# Patient Record
Sex: Male | Born: 1950
Health system: Southern US, Community
[De-identification: ages and names within clinical notes are randomized; demographics above are authoritative.]

## PROBLEM LIST (undated history)

## (undated) DIAGNOSIS — M549 Dorsalgia, unspecified: Secondary | ICD-10-CM

## (undated) DIAGNOSIS — I2510 Atherosclerotic heart disease of native coronary artery without angina pectoris: Secondary | ICD-10-CM

## (undated) DIAGNOSIS — M1990 Unspecified osteoarthritis, unspecified site: Secondary | ICD-10-CM

## (undated) DIAGNOSIS — G8929 Other chronic pain: Secondary | ICD-10-CM

## (undated) DIAGNOSIS — K5190 Ulcerative colitis, unspecified, without complications: Secondary | ICD-10-CM

## (undated) DIAGNOSIS — D649 Anemia, unspecified: Secondary | ICD-10-CM

## (undated) DIAGNOSIS — N400 Enlarged prostate without lower urinary tract symptoms: Secondary | ICD-10-CM

## (undated) DIAGNOSIS — I10 Essential (primary) hypertension: Secondary | ICD-10-CM

## (undated) DIAGNOSIS — Z8619 Personal history of other infectious and parasitic diseases: Secondary | ICD-10-CM

## (undated) DIAGNOSIS — C61 Malignant neoplasm of prostate: Secondary | ICD-10-CM

## (undated) DIAGNOSIS — G709 Myoneural disorder, unspecified: Secondary | ICD-10-CM

## (undated) DIAGNOSIS — U071 COVID-19: Secondary | ICD-10-CM

## (undated) DIAGNOSIS — E785 Hyperlipidemia, unspecified: Secondary | ICD-10-CM

## (undated) DIAGNOSIS — H43392 Other vitreous opacities, left eye: Secondary | ICD-10-CM

## (undated) DIAGNOSIS — G629 Polyneuropathy, unspecified: Secondary | ICD-10-CM

## (undated) DIAGNOSIS — I739 Peripheral vascular disease, unspecified: Secondary | ICD-10-CM

## (undated) HISTORY — DX: Hyperlipidemia, unspecified: E78.5

## (undated) HISTORY — PX: HERNIA REPAIR: SHX51

## (undated) HISTORY — PX: PERIPHERAL ARTERIAL STENT GRAFT: SHX2220

## (undated) HISTORY — DX: Other vitreous opacities, left eye: H43.392

## (undated) HISTORY — DX: Anemia, unspecified: D64.9

## (undated) HISTORY — DX: Essential (primary) hypertension: I10

## (undated) HISTORY — DX: Atherosclerotic heart disease of native coronary artery without angina pectoris: I25.10

## (undated) HISTORY — PX: PERCUTANEOUS CORONARY STENT INTERVENTION (PCI-S): SHX6016

## (undated) HISTORY — DX: Unspecified osteoarthritis, unspecified site: M19.90

## (undated) HISTORY — DX: Personal history of other infectious and parasitic diseases: Z86.19

## (undated) HISTORY — DX: Other chronic pain: G89.29

## (undated) HISTORY — DX: Benign prostatic hyperplasia without lower urinary tract symptoms: N40.0

## (undated) HISTORY — DX: COVID-19: U07.1

## (undated) HISTORY — PX: CARDIAC CATHETERIZATION: SHX172

## (undated) HISTORY — DX: Polyneuropathy, unspecified: G62.9

## (undated) HISTORY — DX: Peripheral vascular disease, unspecified: I73.9

## (undated) HISTORY — DX: Other chronic pain: M54.9

## (undated) HISTORY — DX: Ulcerative colitis, unspecified, without complications: K51.90

## (undated) HISTORY — DX: Myoneural disorder, unspecified: G70.9

## (undated) HISTORY — PX: VITRECTOMY: SHX106

## (undated) HISTORY — PX: PROSTATE BIOPSY: SHX241

## (undated) HISTORY — PX: OTHER SURGICAL HISTORY: SHX169

## (undated) HISTORY — PX: PENILE PROSTHESIS IMPLANT: SHX240

## (undated) HISTORY — PX: COLONOSCOPY: SHX174

## (undated) HISTORY — PX: TRIGGER FINGER RELEASE: SHX641

## (undated) HISTORY — PX: SHOULDER ARTHROSCOPY: SHX128

## (undated) HISTORY — PX: CATARACT EXTRACTION, BILATERAL: SHX1313

---

## 1993-04-20 HISTORY — PX: PENILE PROSTHESIS IMPLANT: SHX240

## 2012-02-19 ENCOUNTER — Encounter: Payer: Self-pay | Admitting: Internal Medicine

## 2016-07-21 ENCOUNTER — Encounter: Payer: Self-pay | Admitting: Internal Medicine

## 2016-07-22 ENCOUNTER — Encounter: Payer: Self-pay | Admitting: Internal Medicine

## 2016-12-24 ENCOUNTER — Encounter: Payer: Self-pay | Admitting: Gastroenterology

## 2017-09-17 ENCOUNTER — Encounter: Payer: Self-pay | Admitting: Gastroenterology

## 2018-10-28 ENCOUNTER — Telehealth: Payer: Self-pay | Admitting: Gastroenterology

## 2018-10-28 NOTE — Telephone Encounter (Signed)
For documentation purpose: Pt just moved to [facility] and requested Dr. Rush Landmark to be his GI MD.  Pt will have his prev GI records faxed to [place] for review; hx of colitis and due for colonoscopy.

## 2018-11-18 NOTE — Telephone Encounter (Signed)
Pt reported that he drove back to [place] to to sign medical release form.  I called Dr. Marijo File office to request medical records--their med rec stated that they will have all of his procedure reports and GI hx within the past year faxed over for review.

## 2018-11-22 ENCOUNTER — Telehealth: Payer: Self-pay | Admitting: Gastroenterology

## 2018-11-22 NOTE — Telephone Encounter (Signed)
Rec'd from S[facility] forwarded 108 pages to Dr. Rush Landmark

## 2018-11-23 ENCOUNTER — Encounter: Payer: Self-pay | Admitting: Gastroenterology

## 2018-11-24 ENCOUNTER — Other Ambulatory Visit: Payer: Self-pay

## 2018-11-24 ENCOUNTER — Encounter: Payer: Self-pay | Admitting: Internal Medicine

## 2018-11-24 ENCOUNTER — Ambulatory Visit (INDEPENDENT_AMBULATORY_CARE_PROVIDER_SITE_OTHER): Payer: Medicare Other | Admitting: Internal Medicine

## 2018-11-24 DIAGNOSIS — I10 Essential (primary) hypertension: Secondary | ICD-10-CM | POA: Diagnosis not present

## 2018-11-24 DIAGNOSIS — K51919 Ulcerative colitis, unspecified with unspecified complications: Secondary | ICD-10-CM

## 2018-11-24 DIAGNOSIS — Z125 Encounter for screening for malignant neoplasm of prostate: Secondary | ICD-10-CM

## 2018-11-24 DIAGNOSIS — R634 Abnormal weight loss: Secondary | ICD-10-CM

## 2018-11-24 DIAGNOSIS — N3943 Post-void dribbling: Secondary | ICD-10-CM

## 2018-11-24 DIAGNOSIS — I2510 Atherosclerotic heart disease of native coronary artery without angina pectoris: Secondary | ICD-10-CM

## 2018-11-24 DIAGNOSIS — Z13818 Encounter for screening for other digestive system disorders: Secondary | ICD-10-CM

## 2018-11-24 DIAGNOSIS — R32 Unspecified urinary incontinence: Secondary | ICD-10-CM

## 2018-11-24 DIAGNOSIS — Z113 Encounter for screening for infections with a predominantly sexual mode of transmission: Secondary | ICD-10-CM

## 2018-11-24 DIAGNOSIS — E559 Vitamin D deficiency, unspecified: Secondary | ICD-10-CM

## 2018-11-24 DIAGNOSIS — Z1329 Encounter for screening for other suspected endocrine disorder: Secondary | ICD-10-CM

## 2018-11-24 DIAGNOSIS — G894 Chronic pain syndrome: Secondary | ICD-10-CM | POA: Insufficient documentation

## 2018-11-24 DIAGNOSIS — N401 Enlarged prostate with lower urinary tract symptoms: Secondary | ICD-10-CM

## 2018-11-24 DIAGNOSIS — G8929 Other chronic pain: Secondary | ICD-10-CM

## 2018-11-24 DIAGNOSIS — I739 Peripheral vascular disease, unspecified: Secondary | ICD-10-CM | POA: Insufficient documentation

## 2018-11-24 DIAGNOSIS — N5319 Other ejaculatory dysfunction: Secondary | ICD-10-CM

## 2018-11-24 DIAGNOSIS — I25118 Atherosclerotic heart disease of native coronary artery with other forms of angina pectoris: Secondary | ICD-10-CM | POA: Insufficient documentation

## 2018-11-24 HISTORY — DX: Unspecified urinary incontinence: R32

## 2018-11-24 HISTORY — DX: Ulcerative colitis, unspecified with unspecified complications: K51.919

## 2018-11-24 HISTORY — DX: Abnormal weight loss: R63.4

## 2018-11-24 HISTORY — DX: Chronic pain syndrome: G89.4

## 2018-11-24 HISTORY — DX: Benign prostatic hyperplasia with lower urinary tract symptoms: N39.43

## 2018-11-24 MED ORDER — TELMISARTAN-HCTZ 40-12.5 MG PO TABS: 1.0000 | ORAL_TABLET | Freq: Every day | ORAL | 3 refills | Status: DC

## 2018-11-24 MED ORDER — OXYCODONE-ACETAMINOPHEN 5-325 MG PO TABS: 1.0000 | ORAL_TABLET | Freq: Three times a day (TID) | ORAL | 0 refills | Status: DC | PRN

## 2018-11-24 NOTE — Progress Notes (Signed)
Telephone Note  I connected with Erik Nicholson  on 11/24/18 at 10:00 AM EDT by a telephone and verified that I am speaking with the correct person using two identifiers.  Location patient: home Location provider:work or home office Persons participating in the virtual visit: patient, provider  I discussed the limitations of evaluation and management by telemedicine and the availability of in person appointments. The patient expressed understanding and agreed to proceed.   HPI: 1. HTN on valsartan hctz 160-12.5 needs refill reviewed this is on recall and need to change  2. Chronic back and right leg pain and neuropathy on narcotics tid prn and leg symptoms worsening with numbness and tingling in right leg s/p 2 back surgeries he is in so much pain at times bothers his gait and has to walk with walker or cane. No falls  Declines NS referral for now  He would like referral to pain clinic  3. H/o UC with diarrhea he wants referral to [facility] GI last colonoscopy 09/2018 referral to Dr. Rush Landmark  4. CAD x 7-8 stents and PVD with 3 stents 2 in right and 1 in left leg he wants to hold on vascular referral for now  5. H/o urinary incontinence, prostate procedure in [place] and problems with ejaculation since prostate procedure wants referral to urology   ROS: See pertinent positives and negatives per HPI. General: wt from 180 2 years ago to 125 now 130  Heent: no sore thoart  CV: no chest pain  Lungs: no sob  GI: no ab pain+ chronic diarrhea  MSK: +chronic back and leg pain  Skin: no issues GU+incontinence urine and ejaculation issues  Neuro: no h/a, +neuropathy right leg  Psych: no anxiety/depression  Past Medical History:  Diagnosis Date  . BPH (benign prostatic hyperplasia)   . CAD (coronary artery disease)   . Chronic back pain    L4/5 with chronic right leg pain   . Neuropathy   . PVD (peripheral vascular disease) ([place])   . Ulcerative colitis ([place])    with diarrhea    Past Surgical  History:  Procedure Laterality Date  . back surgery     x 2 lumbar last 09/2016 in [place]  . COLONOSCOPY     last 09/2018 in [place]  . PERCUTANEOUS CORONARY STENT INTERVENTION (PCI-S)     x 7-8 heart  . pvd     with stenting x 2 right leg below knee and 1 left thigh     Family History  Problem Relation Age of Onset  . Cancer Mother        breast  . Heart disease Mother   . Stroke Mother   . Diabetes Sister     SOCIAL HX:  Moved from [place] in 10/2018 2 sons in 65s as of 11/24/2018  Married wife is DPR Pamala Hurry    Current Outpatient Medications:  .  atorvastatin (LIPITOR) 20 MG tablet, Take 20 mg by mouth daily at 6 PM. , Disp: , Rfl:  .  baclofen (LIORESAL) 10 MG tablet, Take 10 mg by mouth at bedtime. , Disp: , Rfl:  .  clopidogrel (PLAVIX) 75 MG tablet, Take 75 mg by mouth daily. , Disp: , Rfl:  .  famotidine (PEPCID) 40 MG tablet, Take 40 mg by mouth 2 (two) times daily. , Disp: , Rfl:  .  hyoscyamine (LEVBID) 0.375 MG 12 hr tablet, Take 0.375 mg by mouth 2 (two) times daily. , Disp: , Rfl:  .  lidocaine (LIDODERM) 5 %, , Disp: , Rfl:  .  mesalamine (LIALDA) 1.2 g EC tablet, Take 2.4 g by mouth daily with breakfast. , Disp: , Rfl:  .  metoprolol succinate (TOPROL-XL) 50 MG 24 hr tablet, , Disp: , Rfl:  .  nitroGLYCERIN (NITRODUR - DOSED IN MG/24 HR) 0.4 mg/hr patch, Place 0.4 mg onto the skin daily as needed., Disp: , Rfl:  .  oxyCODONE-acetaminophen (PERCOCET/ROXICET) 5-325 MG tablet, Take 1 tablet by mouth every 8 (eight) hours as needed., Disp: 15 tablet, Rfl: 0 .  RESTASIS 0.05 % ophthalmic emulsion, Place 1 drop into both eyes 2 (two) times daily. , Disp: , Rfl:  .  telmisartan-hydrochlorothiazide (MICARDIS HCT) 40-12.5 MG tablet, Take 1 tablet by mouth daily., Disp: 90 tablet, Rfl: 3  EXAM: before failed audio  VITALS per patient if applicable:  GENERAL: alert, oriented, appears well and in no acute distress  HEENT: atraumatic,  conjunttiva clear, no obvious abnormalities on inspection of external nose and ears poor dentition +  NECK: normal movements of the head and neck  LUNGS: on inspection no signs of respiratory distress, breathing rate appears normal, no obvious gross SOB, gasping or wheezing  CV: no obvious cyanosis  MS: moves all visible extremities without noticeable abnormality  PSYCH/NEURO: pleasant and cooperative, no obvious depression or anxiety, speech and thought processing grossly intact  ASSESSMENT AND PLAN:  Discussed the following assessment and plan:  Essential hypertension - Plan: telmisartan-hydrochlorothiazide (MICARDIS HCT) 40-12.5 MG tablet, Comprehensive metabolic panel, CBC w/Diff, Lipid panel labs by 01/2019   Other chronic pain - Plan: oxyCODONE-acetaminophen (PERCOCET/ROXICET) 5-325 MG tablet tid prn short term supply, Ambulatory referral to Pain Clinic  Ulcerative colitis with complication, unspecified location [facility]) - Plan: Ambulatory referral to [facility] [place] Dr. Rush Landmark   Coronary artery disease involving native coronary artery of native heart without angina pectoris - Plan: Ambulatory referral to Cardiology [facility]  Cont meds  PVD (peripheral vascular disease) ([place]) - Plan: Ambulatory referral to Cardiology, hold vvs referral for now  Benign prostatic hyperplasia with post-void dribbling - Plan: Ambulatory referral to Urology, Urinalysis, Routine w reflex microscopic, PSA, Medicare ( [place] only) Urinary incontinence, unspecified type - Plan: Ambulatory referral to Urology Disorder of ejaculation - Plan: Ambulatory referral to Urology  Abnormal weight loss - Plan: TSH, Urinalysis, Routine w reflex microscopic, Hepatitis B surface antibody,quantitative, Hepatitis B surface antigen, Hepatitis C antibody Could not do HIV insurance would not cover Thyroid disorder screening - Plan: TSH,  HM Get copy of records in future from Dr. Lyman Speller in [place]  MD -vaccines, colonoscopy, labs   Colonoscopy per pt last 09/2018 in [place] per pt  Ask about drugs, smoking etoh at f/u   PCP Dr. Lyman Speller pt agreeable will get copy of colonoscopy, labs and vaccines   Vascular appt consider in future  Declines NS referral  Referred  [facility] GI [facility] cards  [facility] clinic  [facility]   Pt ok to get records Dr. Lyman Speller   I discussed the assessment and treatment plan with the patient. The patient was provided an opportunity to ask questions and all were answered. The patient agreed with the plan and demonstrated an understanding of the instructions.   The patient was advised to call back or seek an in-person evaluation if the symptoms worsen or if the condition fails to improve as anticipated.  Time spent 30 minutes  Delorise Jackson, MD

## 2018-12-20 ENCOUNTER — Telehealth: Payer: Self-pay | Admitting: Internal Medicine

## 2018-12-20 NOTE — Telephone Encounter (Signed)
Pt requested refill for metoprolol succinate (TOPROL-XL) 50 MG 24 hr tablet    Pharmacy:  [id number] [place], [phone](Phone) [phone](Fax)

## 2018-12-21 ENCOUNTER — Other Ambulatory Visit: Payer: Self-pay | Admitting: Internal Medicine

## 2018-12-21 DIAGNOSIS — I10 Essential (primary) hypertension: Secondary | ICD-10-CM

## 2018-12-21 MED ORDER — METOPROLOL SUCCINATE ER 50 MG PO TB24: 50.0000 mg | ORAL_TABLET | Freq: Every day | ORAL | 3 refills | Status: DC

## 2018-12-22 ENCOUNTER — Encounter: Payer: Self-pay | Admitting: Gastroenterology

## 2018-12-22 ENCOUNTER — Other Ambulatory Visit (INDEPENDENT_AMBULATORY_CARE_PROVIDER_SITE_OTHER): Payer: Medicare Other

## 2018-12-22 ENCOUNTER — Telehealth: Payer: Self-pay

## 2018-12-22 ENCOUNTER — Ambulatory Visit (INDEPENDENT_AMBULATORY_CARE_PROVIDER_SITE_OTHER): Payer: Medicare Other | Admitting: Gastroenterology

## 2018-12-22 VITALS — BP 130/54 | HR 58 | Temp 97.4°F | Ht 67.0 in | Wt 137.0 lb

## 2018-12-22 DIAGNOSIS — K5100 Ulcerative (chronic) pancolitis without complications: Secondary | ICD-10-CM

## 2018-12-22 DIAGNOSIS — K3189 Other diseases of stomach and duodenum: Secondary | ICD-10-CM | POA: Diagnosis not present

## 2018-12-22 DIAGNOSIS — R152 Fecal urgency: Secondary | ICD-10-CM

## 2018-12-22 DIAGNOSIS — Z8619 Personal history of other infectious and parasitic diseases: Secondary | ICD-10-CM | POA: Diagnosis not present

## 2018-12-22 DIAGNOSIS — K648 Other hemorrhoids: Secondary | ICD-10-CM

## 2018-12-22 DIAGNOSIS — Z7902 Long term (current) use of antithrombotics/antiplatelets: Secondary | ICD-10-CM

## 2018-12-22 DIAGNOSIS — K51019 Ulcerative (chronic) pancolitis with unspecified complications: Secondary | ICD-10-CM

## 2018-12-22 DIAGNOSIS — R141 Gas pain: Secondary | ICD-10-CM

## 2018-12-22 DIAGNOSIS — K31A0 Gastric intestinal metaplasia, unspecified: Secondary | ICD-10-CM

## 2018-12-22 LAB — COMPREHENSIVE METABOLIC PANEL
ALT: 13 U/L (ref 0–53)
AST: 20 U/L (ref 0–37)
Albumin: 4.3 g/dL (ref 3.5–5.2)
Alkaline Phosphatase: 50 U/L (ref 39–117)
BUN: 20 mg/dL (ref 6–23)
CO2: 29 mEq/L (ref 19–32)
Calcium: 9.8 mg/dL (ref 8.4–10.5)
Chloride: 105 mEq/L (ref 96–112)
Creatinine, Ser: 0.85 mg/dL (ref 0.40–1.50)
GFR: 89.04 mL/min (ref >60.00)
Glucose, Bld: 97 mg/dL (ref 70–99)
Potassium: 4.4 mEq/L (ref 3.5–5.1)
Sodium: 140 mEq/L (ref 135–145)
Total Bilirubin: 0.9 mg/dL (ref 0.2–1.2)
Total Protein: 7.7 g/dL (ref 6.0–8.3)

## 2018-12-22 LAB — IBC + FERRITIN
Ferritin: 115.9 ng/mL (ref 22.0–322.0)
Iron: 136 ug/dL (ref 42–165)
Saturation Ratios: 37.4 % (ref 20.0–50.0)
Transferrin: 260 mg/dL (ref 212.0–360.0)

## 2018-12-22 LAB — FOLATE: Folate: >24.7 ng/mL (ref >5.9)

## 2018-12-22 LAB — HIGH SENSITIVITY CRP: CRP, High Sensitivity: 0.28 mg/L (ref 0.000–5.000)

## 2018-12-22 LAB — CBC
HCT: 38.1 % — ABNORMAL LOW (ref 39.0–52.0)
Hemoglobin: 12.6 g/dL — ABNORMAL LOW (ref 13.0–17.0)
MCHC: 33 g/dL (ref 30.0–36.0)
MCV: 102.7 fl — ABNORMAL HIGH (ref 78.0–100.0)
Platelets: 201 10*3/uL (ref 150.0–400.0)
RBC: 3.71 Mil/uL — ABNORMAL LOW (ref 4.22–5.81)
RDW: 12.4 % (ref 11.5–15.5)
WBC: 5.4 10*3/uL (ref 4.0–10.5)

## 2018-12-22 LAB — PROTIME-INR
INR: 1.1 ratio — ABNORMAL HIGH (ref 0.8–1.0)
Prothrombin Time: 12.5 s (ref 9.6–13.1)

## 2018-12-22 LAB — SEDIMENTATION RATE: Sed Rate: 5 mm/hr (ref 0–20)

## 2018-12-22 LAB — VITAMIN B12: Vitamin B-12: 910 pg/mL (ref 211–911)

## 2018-12-22 MED ORDER — PEG 3350-KCL-NA BICARB-NACL 420 G PO SOLR: 4000.0000 mL | Freq: Once | ORAL | 0 refills | Status: AC

## 2018-12-22 NOTE — Patient Instructions (Addendum)
Your provider has requested that you go to the basement level for lab work before leaving today. Press "B" on the elevator. The lab is located at the first door on the left as you exit the elevator.  If you are age 70 or older, your body mass index should be between 23-30. Your Body mass index is 21.46 kg/m. If this is out of the aforementioned range listed, please consider follow up with your Primary Care Provider.  If you are age 86 or younger, your body mass index should be between 19-25. Your Body mass index is 21.46 kg/m. If this is out of the aformentioned range listed, please consider follow up with your Primary Care Provider.   You have been scheduled for an endoscopy and colonoscopy. Please follow the written instructions given to you at your visit today. Please pick up your prep supplies at the pharmacy within the next 1-3 days. If you use inhalers (even only as needed), please bring them with you on the day of your procedure.  You have been scheduled for an abdominal ultrasound at [facility] (1st floor of hospital) on 12/29/2018 at 9:00am. Please arrive 15 minutes(8:45am) prior to your appointment for registration. Make certain not to have anything to eat or drink 6 hours prior to your appointment. Should you need to reschedule your appointment, please contact radiology at [phone]. This test typically takes about 30 minutes to perform.  We will send clearance for your plavix to your Primary Care Provider. We will contact you once we have clearance.   We will try to obtain records from [facility].  Thank you for choosing me and [facility].  Dr. Rush Landmark

## 2018-12-22 NOTE — Telephone Encounter (Signed)
Request for surgical clearance:     Endoscopy Procedure  What type of surgery is being performed?     Colonoscopy/Endoscopy   When is this surgery scheduled?     01/19/2019  What type of clearance is required ?   Pharmacy  Are there any medications that need to be held prior to surgery and how long? Plavix x 5 days prior to procedure  Practice name and name of physician performing surgery?      [facility]  What is your office phone and fax number?      Phone- [phone]  Fax- [phone]- Erik Nicholson  Anesthesia type (None, local, MAC, general) ?       MAC

## 2018-12-22 NOTE — Telephone Encounter (Signed)
He is seeing cardiology 01/05/19 please clear for colonoscopy/endoscopy upcoming 01/19/2019  And comment on plavix    Thanks [place]

## 2018-12-22 NOTE — Progress Notes (Signed)
[name] VISIT   Primary Care Provider [name], MD [address] [place] [address] [phone]  Referring Provider [name], [name], MD [place],  [place] [address] [phone]  Patient Profile: Erik Nicholson is a 68 y.o. male with a pmh significant for reported pan ulcerative colitis diagnosed greater than 15 years ago, CAD status post PCI, PAD status post stenting, hypertension, hyperlipidemia, chronic back pain, BPH, prior hepatitis C (status post treatment), pancreatic gastric rest, penile implant, osteoarthritis.  The patient presents to the [facility] for an evaluation and management of problem(s) noted below:  Problem List 1. Ulcerative chronic pancolitis without complications ([name])   2. Hx of hepatitis C   3. Rectal urgency   4. Intestinal metaplasia of gastric mucosa   5. Other hemorrhoids   6. Abdominal gas pain   7. Antiplatelet or antithrombotic long-term use     History of Present Illness This is the patient's first visit to the outpatient [facility].  The patient was followed in [place] over the course of the last 10 to 15 years by Dr. [name] who was his primary gastroenterologist.  Greater than 150 pages of notes from the last 2 years are brought in for review today.  In brief, the patient states he has had longstanding ulcerative colitis and believes it to be diagnosed 10 to 15 years ago.  He has received over the course of the last 10 years yearly colonoscopies.  Based on the last notation from June 2020 by Dr. [name] the patient was planned for repeat colonoscopy due to poor preparation of the colon this was to be done this year in 2020.  He also had a history of gastric intestinal metaplasia found previously and was planned for EGD follow-up this year.  For rectal urgency he has been on Levbid twice daily.  For his ulcerative colitis he has been most recently on mesalamine 1.2 g  daily but previously had been on Apriso years prior.  His history of hepatitis C was no longer a problem on his list but we do not have any laboratories from that time.  He states he was treated at [facility] around 2017 we do not have any access to any of those records however.  Is not clear that he had a liver biopsy before he got treated for hepatitis C but he states he had one many years ago.  Not clear if he had a FibroScan though I would suspect he probably did.  The patient moved to [place] to be closer to his family.  Looks like over the course the last 10 years, the patient has had yearly colonoscopies and nearly yearly endoscopies.  The reasoning for the upper endoscopies is not completely clear.  It looks like he had a pancreatic rest as well that was noted with no plan for further follow-up based on 2018 EUS.  From a ulcerative colitis perspective he has a bowel movement on a daily basis most the time this is formed but sometimes it can be liquidy.  He notes no blood unless his hemorrhoids are causing him issues.  He has tremendous urgency at times but this is more often a infrequent occurrence.  He has been given steroids in the past such that he will take a dose of prednisone if he is going to be out with his family to decrease the risk of having fecal urgency and a potential near incontinence episode.  He  is previously been on Apriso but is more recently been on mesalamine.  He is only taking 2.4 g of mesalamine currently.  He has no nocturnal symptoms.  He has dark stools but he is on iron and has been on iron for a while.  He is never had a video capsule endoscopy.  We do not have iron indices in any of his lab work.  He has pyrosis but it is medically controlled.  Patient does not take significant nonsteroidals.  He is on Plavix but he self regulates his dosing and sometimes does not take it for 2 to 3 days because he gets significant bleeding and bruising but no evidence  of GI bleeding.  GI Review of Systems Positive as above including mildly decreased appetite and early satiety (has had negative gastric emptying study previously) Negative for dysphagia, odynophagia, abdominal pain, change in bowel habits  Review of Systems General: Denies fevers/chills/weight loss HEENT: Denies oral lesions Cardiovascular: Denies chest pain Pulmonary: Denies shortness of breath/nocturnal cough Gastroenterological: See HPI Genitourinary: Denies darkened urine Hematological: Positive for easy bruising/bleeding due to Plavix Endocrine: Denies temperature intolerance Dermatological: Denies jaundice Psychological: Mood is stable   Medications Current Outpatient Medications  Medication Sig Dispense Refill  . atorvastatin (LIPITOR) 20 MG tablet Take 20 mg by mouth daily at 6 PM.     . baclofen (LIORESAL) 10 MG tablet Take 10 mg by mouth at bedtime.     . clopidogrel (PLAVIX) 75 MG tablet Take 75 mg by mouth daily.     . famotidine (PEPCID) 40 MG tablet Take 40 mg by mouth 2 (two) times daily.     . hyoscyamine (LEVBID) 0.375 MG 12 hr tablet Take 0.375 mg by mouth 2 (two) times daily.     Marland Kitchen lidocaine (LIDODERM) 5 %     . mesalamine (LIALDA) 1.2 g EC tablet Take 2.4 g by mouth daily with breakfast.     . metoprolol succinate (TOPROL-XL) 50 MG 24 hr tablet Take 1 tablet (50 mg total) by mouth daily. 90 tablet 3  . nitroGLYCERIN (NITRODUR - DOSED IN MG/24 HR) 0.4 mg/hr patch Place 0.4 mg onto the skin daily as needed.    Marland Kitchen oxyCODONE-acetaminophen (PERCOCET/ROXICET) 5-325 MG tablet Take 1 tablet by mouth every 8 (eight) hours as needed. 15 tablet 0  . RESTASIS 0.05 % ophthalmic emulsion Place 1 drop into both eyes 2 (two) times daily.     Marland Kitchen telmisartan-hydrochlorothiazide (MICARDIS HCT) 40-12.5 MG tablet Take 1 tablet by mouth daily. 90 tablet 3   No current facility-administered medications for this visit.     Allergies Not on File  Histories Past Medical History:   Diagnosis Date  . BPH (benign prostatic hyperplasia)   . CAD (coronary artery disease)   . Chronic back pain    L4/5 with chronic right leg pain   . Hx of hepatitis C    treated with Harvoni  . Neuropathy   . PVD (peripheral vascular disease) ([place])   . Ulcerative colitis ([place])    with diarrhea   Past Surgical History:  Procedure Laterality Date  . back surgery     x 2 lumbar last 09/2016 in [place]  . COLONOSCOPY     last 09/2018 in [place]  . PENILE PROSTHESIS IMPLANT    . PERCUTANEOUS CORONARY STENT INTERVENTION (PCI-S)     x 7-8 heart  . pvd     with stenting x 2 right leg below knee and 1 left thigh   .  SHOULDER ARTHROSCOPY Bilateral    Social History   Socioeconomic History  . Marital status: Married    Spouse name: Not on file  . Number of children: Not on file  . Years of education: Not on file  . Highest education level: Not on file  Occupational History  . Not on file  Social Needs  . Financial resource strain: Not on file  . Food insecurity    Worry: Not on file    Inability: Not on file  . Transportation needs    Medical: Not on file    Non-medical: Not on file  Tobacco Use  . Smoking status: Never Smoker  . Smokeless tobacco: Never Used  Substance and Sexual Activity  . Alcohol use: Not on file  . Drug use: Not Currently  . Sexual activity: Yes  Lifestyle  . Physical activity    Days per week: Not on file    Minutes per session: Not on file  . Stress: Not on file  Relationships  . Social Herbalist on phone: Not on file    Gets together: Not on file    Attends religious service: Not on file    Active member of club or organization: Not on file    Attends meetings of clubs or organizations: Not on file    Relationship status: Not on file  . Intimate partner violence    Fear of current or ex partner: Not on file    Emotionally abused: Not on file    Physically abused: Not on file    Forced sexual activity: Not on  file  Other Topics Concern  . Not on file  Social History Narrative   Moved from [place] in 10/2018   2 sons in 17s as of 11/24/2018    Married wife is DPR Pamala Hurry    Family History  Problem Relation Age of Onset  . Cancer Mother        breast  . Heart disease Mother   . Stroke Mother   . Diabetes Sister   . Colon cancer Neg Hx   . Esophageal cancer Neg Hx   . Inflammatory bowel disease Neg Hx   . Liver disease Neg Hx   . Pancreatic cancer Neg Hx   . Rectal cancer Neg Hx   . Stomach cancer Neg Hx    I have reviewed his medical, social, and family history in detail and updated the electronic medical record as necessary.    PHYSICAL EXAMINATION  BP (!) 130/54   Pulse (!) 58   Temp (!) 97.4 F (36.3 C)   Ht _0  (1.702 m)   Wt 137 lb (62.1 kg)   SpO2 100%   BMI 21.46 kg/m  Wt Readings from Last 3 Encounters:  12/22/18 137 lb (62.1 kg)  GEN: NAD, appears stated age, doesn't appear chronically ill PSYCH: Cooperative, without pressured speech EYE: Conjunctivae pink, sclerae anicteric ENT: MMM, without oral ulcers, no erythema or exudates noted NECK: Supple CV: RR without R/Gs  RESP: CTAB posteriorly, without wheezing GI: NABS, soft, NT/ND, without rebound or guarding, no HSM appreciated MSK/EXT: No lower extremity edema SKIN: No jaundice NEURO:  Alert & Oriented x 3, no focal deficits   REVIEW OF DATA  I reviewed the following data at the time of this encounter:  GI Procedures and Studies  May 2020 colonoscopy Terminal ileum was normal.  The colonic mucosa revealed erythema at 25 cm from the anal verge.  Rest of the colon appeared normal without ulceration.  Internal hemorrhoids were found.  Biopsies were taken.  Colonoscopy prep was poor.  Pathology showed normal TI, normal ascending colon, normal transverse colon, normal descending colon, normal sigmoid colon, normal rectal biopsies, normal biopsies at sigmoid erythema at 25 cm,  May 2019  colonoscopy There was some erythema, friability in the cecum as well as in the proximal ascending colon.  The rest the colon appeared endoscopically normal without ulcerations.  Serial biopsies were taken every 10 cm.  Small internal hemorrhoids were found.  Colonoscopy prep was good. Pathology Cecum normal colon with benign lymphoid aggregate.  Ascending colon normal.  Transverse colon unremarkable colon mucosa.  Descending colon unremarkable colon mucosa.  Sigmoid colon normal.  Rectum normal.  April 2019 EGD Esophagus Z line at 40.  No esophagitis or ulceration.  Stomach revealed increased mucosal erythema in the antrum as well as gastric body consistent with gastritis.  There is a small anterior posterior wall lesion that has been evaluated previously by EUS to be compatible with a pancreatic rest.  This was located in centimeters to the pylorus with central depression as well as erosion on the surface of the lesion.  Biopsies were taken.  Biopsies were also taken from the antrum and body.  No lesions seen in the cardia or fundus.  Duodenal bulb and second portion of duodenum revealed no lesions. Pathology Biopsies of antrum showed foveolar hyperplasia negative for H. pylori.  There is evidence of intestinal metaplasia. Stomach pancreatic rest showed foveolar hyperplasia with reactive changes. Stomach body shows foveolar hyperplasia, fibrosis but no intestinal metaplasia  October 2018 upper EUS Subepithelial lesion in the antrum with central umbilication otherwise normal exam.  Hypoechoic lesion arising from the mucosa with anechoic duct compatible with a pancreatic rest.  No further EUS follow-up required.  May 2018 colonoscopy Ulcerative colitis in remission.  Colonic mucosa did not reveal any erythema or edema or ulceration.  Serial biopsies obtained.  Colonoscopy prep was good.  Internal hemorrhoids. Pathology consistent with ascending/transverse/descending/sigmoid/rectum biopsy showing  benign colorectal mucosa with mild increased chronic inflammation, mild architectural glandular distortion and reactive/hyperplastic epithelial changes.  It is negative for active inflammation.  May 2018 EGD Esophagus Z line at 40 cm.  No esophagitis noted.  Stomach revealed antral wall gastric ulcer of 8 mm with edematous fold surrounding ulcer.  Biopsies taken.  Duodenal bulb revealed Brunner's gland hyperplasia with biopsies taken. Pathology consistent with gastric ulcer brushing negative for malignant cells.  Stomach body/incisura biopsies show mild chronic gastritis negative for intestinal metaplasia.  Stomach ulcer biopsies show benign gastric antral mucosa healing ulcer erosion negative for metaplasia.  Duodenum shows prominent Brunner's glands negative for celiac  Laboratory Studies  June 2019 Fecal calprotectin 106 (borderline) CRP 0.20 Aug 2017 Sodium 140 Potassium 4.1 AST/ALT 21/18 Total bili 0.5 Alkaline phosphatase 52 AFP 2.4 Hemoglobin 11.5 Hematocrit 33.7 MCV 98.5 WBC 4.3 Platelet 182 HCV antibody positive HCVRNA negative  September 2018 AST/ALT 20/10 Total bilirubin 0.5 Alkaline phosphatase 68 WBC 5.6 Hemoglobin 11.9 Hematocrit 35.6 MCV 92.7 Platelets 177  Imaging Studies  October 2019 gastric emptying study No evidence of gastroparesis after 4 hours.  May 2019 liver ultrasound Mild dilation of the common bile duct and central intrahepatic bile ducts.  Similar findings are present on CT scan from December 2018.  Consider role of MRCP. Cholelithiasis without evidence of acute cholecystitis.  January 2019 upper GI series Jejunal ileal fold pattern reversal.  Compatible with celiac  disease   ASSESSMENT  Mr. Shamus Desantis is a 68 y.o. male with a pmh significant for reported pan ulcerative colitis diagnosed greater than 15 years ago, CAD status post PCI, PAD status post stenting, hypertension, hyperlipidemia, chronic back pain, BPH, prior hepatitis C (status  post treatment), pancreatic gastric rest, penile implant, osteoarthritis.  The patient is seen today for evaluation and management of:  1. Ulcerative chronic pancolitis without complications ([place])   2. Hx of hepatitis C   3. Rectal urgency   4. Intestinal metaplasia of gastric mucosa   5. Other hemorrhoids   6. Abdominal gas pain   7. Antiplatelet or antithrombotic long-term use    The patient is hemodynamically stable.  From the patient's reported pan ulcerative colitis and most recent endoscopic evaluation it does not look like he is in endoscopic and clinical remission.  However, the most recent colonoscopy was a poor preparation even though biopsies were obtained throughout so it is worthwhile for [place] to consider getting him up-to-date with a colonoscopy to ensure clinical stability.  The patient will benefit from this due to his greater than 10-year history of pan ulcerative colitis.  Will consider surveillance colonoscopies every 1 to 2 years thereafter.  Because of the history of intestinal metaplasia found on recent endoscopy is worthwhile for [place] to proceed with repeat biopsies and do gastric mapping.  He has a known pancreatic rest and in the future could consider an FT RD to remove it completely and take this off the clinical picture or discontinue to monitor as previous EUS and suggested.  Patient has anemia but has a normal MCV based on other labs in his chart.  We will see what his labs look like here but he continues to take oral iron.  Is not clear to me that he has a true iron deficiency though if he had active ulcerative colitis I would expect some component of iron deficiency.  I am okay with him continuing that for now as he is done it for many years but may want to also evaluate for other micronutrient deficiencies.  We will see what his labs look like and go from there.  Patient denies significant alcohol consumption.  Thalassemia is something we should consider as well though it does  not look like this and microcytic anemia.  I would continue letting him take hyoscyamine as it is helpful for him in regards to getting secretions and his rectal urgency under control.  We will reach out to his PCP to ensure that they are okay with the patient coming off Plavix (he already somewhat regulates his Plavix himself) for at least 5 days prior to endoscopy/colonoscopy.  We will prescribe his Lialda at current dosing but query the role of increasing it based on what is found on his biopsies.  We need to get the results and work-up that he had at [facility] prior to his hep C being treated so that we can have an understanding of what his level of fibrosis was.  He has low normal platelets and we need to get an INR and liver test to get them up-to-date though I suspect he likely had a low level fibrosis.  Make query the role of an ultrasound with elastography at some point in time.  Not clear if a person does not have F for or F3 fibrosis about the role of [place] screening but will go ahead and get a liver ultrasound currently.  The risks and benefits of endoscopic  evaluation were discussed with the patient; these include but are not limited to the risk of perforation, infection, bleeding, missed lesions, lack of diagnosis, severe illness requiring hospitalization, as well as anesthesia and sedation related illnesses.  The patient is agreeable to proceed.  All patient questions were answered, to the best of my ability, and the patient agrees to the aforementioned plan of action with follow-up as indicated.   PLAN  Laboratories as outlined below Proceed with scheduling surveillance colonoscopy for pan ulcerative colitis Proceed with scheduling surveillance endoscopy for evaluation of intestinal metaplasia and gastric mapping Continue Lialda 2.4 g daily (may consider increasing in future) new prescription to be sent to patient Continue hyoscyamine (patient will call when he needs refills) Liver  ultrasound [place] records for HCV treatment Continue iron but query additional work-up based on findings on laboratories and micronutrient evaluation   Orders Placed This Encounter  Procedures  . US Abdomen Complete  . CBC  . Comp Met (CMET)  . Sedimentation rate  . CRP High sensitivity  . IBC + Ferritin  . B12  . Folate  . INR/PT  . Ambulatory referral to Gastroenterology    New Prescriptions   No medications on file   Modified Medications   No medications on file    Planned Follow Up No follow-ups on file.   Justice Britain, MD [facility] Advanced Endoscopy Office # [phone]

## 2018-12-22 NOTE — Telephone Encounter (Signed)
Thanks for update. Erik Nicholson, let's be on the lookout for the follow up from Cardiology visit. GM

## 2018-12-23 DIAGNOSIS — K31A0 Gastric intestinal metaplasia, unspecified: Secondary | ICD-10-CM

## 2018-12-23 DIAGNOSIS — R141 Gas pain: Secondary | ICD-10-CM | POA: Insufficient documentation

## 2018-12-23 DIAGNOSIS — R152 Fecal urgency: Secondary | ICD-10-CM

## 2018-12-23 DIAGNOSIS — K3189 Other diseases of stomach and duodenum: Secondary | ICD-10-CM | POA: Insufficient documentation

## 2018-12-23 DIAGNOSIS — K648 Other hemorrhoids: Secondary | ICD-10-CM

## 2018-12-23 DIAGNOSIS — Z8619 Personal history of other infectious and parasitic diseases: Secondary | ICD-10-CM

## 2018-12-23 DIAGNOSIS — Z7902 Long term (current) use of antithrombotics/antiplatelets: Secondary | ICD-10-CM

## 2018-12-23 DIAGNOSIS — K5100 Ulcerative (chronic) pancolitis without complications: Secondary | ICD-10-CM | POA: Insufficient documentation

## 2018-12-23 HISTORY — DX: Long term (current) use of antithrombotics/antiplatelets: Z79.02

## 2018-12-23 HISTORY — DX: Ulcerative (chronic) pancolitis without complications: K51.00

## 2018-12-23 HISTORY — DX: Gas pain: R14.1

## 2018-12-23 HISTORY — DX: Gastric intestinal metaplasia, unspecified: K31.A0

## 2018-12-23 HISTORY — DX: Other hemorrhoids: K64.8

## 2018-12-23 HISTORY — DX: Fecal urgency: R15.2

## 2018-12-23 HISTORY — DX: Personal history of other infectious and parasitic diseases: Z86.19

## 2018-12-29 ENCOUNTER — Other Ambulatory Visit: Payer: Self-pay

## 2018-12-29 ENCOUNTER — Ambulatory Visit (HOSPITAL_COMMUNITY)
Admission: RE | Admit: 2018-12-29 | Discharge: 2018-12-29 | Disposition: A | Discharge status: Home / Self Care | Payer: Medicare Other | Source: Ambulatory Visit | Attending: Gastroenterology | Admitting: Gastroenterology

## 2018-12-29 DIAGNOSIS — Z8619 Personal history of other infectious and parasitic diseases: Secondary | ICD-10-CM | POA: Diagnosis present

## 2018-12-29 DIAGNOSIS — K5100 Ulcerative (chronic) pancolitis without complications: Secondary | ICD-10-CM | POA: Diagnosis present

## 2019-01-03 ENCOUNTER — Telehealth: Payer: Self-pay | Admitting: Gastroenterology

## 2019-01-03 MED ORDER — MESALAMINE 1.2 G PO TBEC: 2.4000 g | DELAYED_RELEASE_TABLET | Freq: Every day | ORAL | 3 refills | Status: DC

## 2019-01-03 MED ORDER — FAMOTIDINE 40 MG PO TABS: 40.0000 mg | ORAL_TABLET | Freq: Two times a day (BID) | ORAL | 3 refills | Status: DC

## 2019-01-03 NOTE — Telephone Encounter (Signed)
Prescriptions sent to [facility].

## 2019-01-03 NOTE — Telephone Encounter (Signed)
Pt requested Dr. Rush Landmark to prescribe famotidine and mesalamine sent to [facility].

## 2019-01-05 ENCOUNTER — Other Ambulatory Visit: Payer: Self-pay

## 2019-01-05 ENCOUNTER — Ambulatory Visit (INDEPENDENT_AMBULATORY_CARE_PROVIDER_SITE_OTHER): Payer: Medicare Other | Admitting: Internal Medicine

## 2019-01-05 ENCOUNTER — Encounter: Payer: Self-pay | Admitting: Internal Medicine

## 2019-01-05 VITALS — BP 121/78 | HR 62 | Ht 67.0 in | Wt 138.5 lb

## 2019-01-05 DIAGNOSIS — I25118 Atherosclerotic heart disease of native coronary artery with other forms of angina pectoris: Secondary | ICD-10-CM

## 2019-01-05 DIAGNOSIS — E785 Hyperlipidemia, unspecified: Secondary | ICD-10-CM

## 2019-01-05 DIAGNOSIS — I5032 Chronic diastolic (congestive) heart failure: Secondary | ICD-10-CM | POA: Diagnosis not present

## 2019-01-05 DIAGNOSIS — I739 Peripheral vascular disease, unspecified: Secondary | ICD-10-CM | POA: Diagnosis not present

## 2019-01-05 DIAGNOSIS — I10 Essential (primary) hypertension: Secondary | ICD-10-CM

## 2019-01-05 NOTE — Patient Instructions (Addendum)
Medication Instructions:  Your physician recommends that you continue on your current medications as directed. Please refer to the Current Medication list given to you today.  If you need a refill on your cardiac medications before your next appointment, please call your pharmacy.   Lab work: Your physician recommends that you return for lab work in: 2-3 weeks at the medical mall. You will need to be fasting.  No appt is needed. Hours are M-F 7AM- 6 PM.  If you have labs (blood work) drawn today and your tests are completely normal, you will receive your results only by: Marland Kitchen MyChart Message (if you have MyChart) OR . A paper copy in the mail If you have any lab test that is abnormal or we need to change your treatment, we will call you to review the results.  Testing/Procedures: 1- Echo  Please return to [facility] on ______________ at _______________ AM( appt preferred) for an Echocardiogram. Your physician has requested that you have an echocardiogram. Echocardiography is a painless test that uses sound waves to create images of your heart. It provides your doctor with information about the size and shape of your heart and how well your heart's chambers and valves are working. This procedure takes approximately one hour. There are no restrictions for this procedure. Please note; depending on visual quality an IV may need to be placed.   2- Lower Extremity ultrasound Your physician has requested that you have an ankle brachial index (ABI). During this test an ultrasound and blood pressure cuff are used to evaluate the arteries that supply the arms and legs with blood. Allow thirty minutes for this exam. There are no restrictions or special instructions.    Follow-Up: At [facility], you and your health needs are our priority.  As part of our continuing mission to provide you with exceptional heart care, we have created designated Provider Care Teams.  These Care Teams  include your primary Cardiologist (physician) and Advanced Practice Providers (APPs -  Physician Assistants and Nurse Practitioners) who all work together to provide you with the care you need, when you need it. You will need a follow up appointment in 6-8 weeks.  You may see Dr. Saunders Revel or one of the following Advanced Practice Providers on your designated Care Team:   Murray Hodgkins, NP Christell Faith, PA-C . Marrianne Mood, PA-C

## 2019-01-05 NOTE — Progress Notes (Signed)
New Outpatient Visit Date: 01/05/2019  Referring Provider: [name], [name], MD [place],  [place] [address]  Chief Complaint: Chest and leg pain  HPI:  [name] is a 68 y.o. male who is being seen today for the evaluation of coronary artery and peripheral vascular disease at the request of Dr. Terese Door. He has a history of coronary artery disease status post multiple PCI's, peripheral vascular disease status post bilateral lower extremity endovascular interventions, ulcerative colitis, hypertension, hyperlipidemia, hepatitis C, and BPH.  He moved from [place] to [place] approximately 2 years ago.  He was previously followed by Dr. Oletta Darter in [place].  He reports having undergone his first stent placement in the 1990's and has needed periodic stenting since then due to worsening chest pain.  He is unsure if he ever had a heart attack.  Today, [name] reports feeling well, though he still has episodic chest pain.  At times, he will have severe chest pressure that comes out of nowhere.  He usually places a NTG patch when this occurs and has resolution of chest pain over the next 30 minutes.  Episodes happen about twice a year, most recent around Christmas 2019.  He notes intermittent mild chest pressure that comes and goes.  At times, it will come on when he chases after his grandchildren.  He reports stable dyspnea on exertion, being able to walk about 1 block.  His mobility is also very limited by right leg pain.  At times, he feels like his right shin is going to explode and give out on him.  The leg pain seems to worsen with walking.  He wonders if some of the leg pain could be due to chronic back and nerve issues.  [name] does not exercise regularly.  He denies palpitations and lightheadedness.  He notes occasional dependent ankle edema, right greater than left.   --------------------------------------------------------------------------------------------------  Cardiovascular History & Procedures: Cardiovascular Problems:  Coronary artery disease  Peripheral vascular disease  Risk Factors:  Known CAD and PAD, hypertension, hyperlipidemia, male gender, and age greater than 34  Cath/PCI:  RLE PTA (02/19/2014): Right mid and distal SFA laser atherectomy and PTA/stenting.  Runoff (02/01/2014): Severe right SFA stenosis with 2-vessel runoff.  Abdominal aortogran and runoff/PTA (04/26/2012): Right SFA stenting and left SFA PTA with DCB  LHC (02/25/2012): LMCA, LAD, and LCx "without any obtructive lesions).  RCA with 40% mid vessel stenosis.  Proximal and mid RCA stents patent.  LVEF 60%  Abdominal aortogram and runoff (02/25/2012): 95% bilateral SFA stenoses.  CV Surgery:  None  EP Procedures and Devices:  None  Non-Invasive Evaluation(s):  Abdominal aorta duplex (07/22/2016): No AAA.  Maximal aortic diameter 2.8 cm.  TTE (07/22/2016): Normal LV size with mild LVH and grade 1 diastolic dysfunction.  LVEF 65%.  Trace aortic and mitral regurgitation.  Pharmacologic MPI (07/21/2016): Normal myocardial perfusion without ischemia or scar.  Renal artery duplex (02/19/2012): Normal without evidence of renal artery stenosis  Recent CV Pertinent Labs: Lab Results  Component Value Date   INR 1.1 (H) 12/22/2018   K 4.4 12/22/2018   BUN 20 12/22/2018   CREATININE 0.85 12/22/2018    --------------------------------------------------------------------------------------------------  Past Medical History:  Diagnosis Date  . BPH (benign prostatic hyperplasia)   . CAD (coronary artery disease)   . Chronic back pain    L4/5 with chronic right leg pain   . Hx of hepatitis C    treated with Harvoni  .  Neuropathy   . PVD (peripheral vascular disease) ([place])   . Ulcerative colitis ([place])    with diarrhea    Past Surgical History:  Procedure  Laterality Date  . back surgery     x 2 lumbar last 09/2016 in [place]  . CARDIAC CATHETERIZATION    . COLONOSCOPY     last 09/2018 in [place]  . PENILE PROSTHESIS IMPLANT    . PERCUTANEOUS CORONARY STENT INTERVENTION (PCI-S)     x 7-8 heart  . PERIPHERAL ARTERIAL STENT GRAFT    . pvd     with stenting x 2 right leg below knee and 1 left thigh   . SHOULDER ARTHROSCOPY Bilateral     Current Meds  Medication Sig  . atorvastatin (LIPITOR) 20 MG tablet Take 20 mg by mouth daily at 6 PM.   . baclofen (LIORESAL) 10 MG tablet Take 10 mg by mouth at bedtime.   . clopidogrel (PLAVIX) 75 MG tablet Take 75 mg by mouth daily.   . famotidine (PEPCID) 40 MG tablet Take 1 tablet (40 mg total) by mouth 2 (two) times daily.  . ferrous sulfate 325 (65 FE) MG EC tablet Take 325 mg by mouth daily.  . hyoscyamine (LEVBID) 0.375 MG 12 hr tablet Take 0.375 mg by mouth 2 (two) times daily.   Marland Kitchen lidocaine (LIDODERM) 5 %   . mesalamine (LIALDA) 1.2 g EC tablet Take 2 tablets (2.4 g total) by mouth daily with breakfast.  . metoprolol succinate (TOPROL-XL) 50 MG 24 hr tablet Take 1 tablet (50 mg total) by mouth daily.  . Multiple Vitamin (MULTIVITAMIN) tablet Take 1 tablet by mouth daily.  . nitroGLYCERIN (NITRODUR - DOSED IN MG/24 HR) 0.4 mg/hr patch Place 0.4 mg onto the skin daily as needed.  Marland Kitchen oxyCODONE-acetaminophen (PERCOCET/ROXICET) 5-325 MG tablet Take 1 tablet by mouth every 8 (eight) hours as needed.  Marland Kitchen PREDNISONE PO Take by mouth as needed.  . RESTASIS 0.05 % ophthalmic emulsion Place 1 drop into both eyes 2 (two) times daily.   Marland Kitchen telmisartan-hydrochlorothiazide (MICARDIS HCT) 40-12.5 MG tablet Take 1 tablet by mouth daily.    Allergies: Patient has no allergy information on record.  Social History   Tobacco Use  . Smoking status: Never Smoker  . Smokeless tobacco: Never Used  Substance Use Topics  . Alcohol use: Yes    Comment: occasional  . Drug use: Not Currently     Family History  Problem Relation Age of Onset  . Cancer Mother        breast  . Heart disease Mother   . Stroke Mother   . Diabetes Sister   . Colon cancer Neg Hx   . Esophageal cancer Neg Hx   . Inflammatory bowel disease Neg Hx   . Liver disease Neg Hx   . Pancreatic cancer Neg Hx   . Rectal cancer Neg Hx   . Stomach cancer Neg Hx     Review of Systems: A 12-system review of systems was performed and was negative except as noted in the HPI.  --------------------------------------------------------------------------------------------------  Physical Exam: BP 121/78 (BP Location: Right Arm, Patient Position: Sitting, Cuff Size: Normal)   Pulse 62   Ht 5' 7"  (1.702 m)   Wt 138 lb 8 oz (62.8 kg)   SpO2 98%   BMI 21.69 kg/m   General:  NAD. HEENT: No conjunctival pallor or scleral icterus. Facemask in place Neck: Supple without lymphadenopathy, thyromegaly, JVD, or HJR. No carotid bruit. Lungs:  Normal work of breathing. Clear to auscultation bilaterally without wheezes or crackles. Heart: Regular rate and rhythm without murmurs, rubs, or gallops. Non-displaced PMI. Abd: Bowel sounds present. Soft, NT/ND without hepatosplenomegaly Ext: No lower extremity edema. Radial, PT, and DP pulses are 2+ bilaterally Skin: Warm and dry without rash. Neuro: CNIII-XII intact. Strength and fine-touch sensation intact in upper and lower extremities bilaterally. Psych: Normal mood and affect.  EKG:  Normal sinus rhythm with LVH.  Lab Results  Component Value Date   WBC 5.4 12/22/2018   HGB 12.6 (L) 12/22/2018   HCT 38.1 (L) 12/22/2018   MCV 102.7 (H) 12/22/2018   PLT 201.0 12/22/2018    Lab Results  Component Value Date   NA 140 12/22/2018   K 4.4 12/22/2018   CL 105 12/22/2018   CO2 29 12/22/2018   BUN 20 12/22/2018   CREATININE 0.85 12/22/2018   GLUCOSE 97 12/22/2018   ALT 13 12/22/2018    No results found for: CHOL, HDL, LDLCALC, LDLDIRECT, TRIG, CHOLHDL    --------------------------------------------------------------------------------------------------  ASSESSMENT AND PLAN: Coronary artery disease with stable angina: [name] reports longstanding intermittent angina controlled with occasional use of NTG patches.  His symptoms are consistent with CCS class II, though sporadic pain at rest is concerning (last episode ~9 months ago).  I recommend echocardiogram to assess LVEF and wall motion.  If normal, I think it would be prudent to repeat a pharmacologic myocardial perfusion stress test.  We will defer medication changes at this time, though use of a isosorbide mononitrate or ranolazine may be a good option for him.  We will continue clopidogrel for secondary prevention.  Chronic HFpEF: [name] appears euvolemic with stable NYHA class II-III symptoms.  We will repeat an echo and continue his current medications.  PAD with claudication: [name] has history of severe bilateral outflow disease with bilateral interventions.  He has significant right calf pain when walking 1 block or more.  We have agreed to perform bilateral ABI's and lower extremity Dopplers.  If there is evidence of significant disease, we will need to consider repeating angiography.  We will continue with secondary prevention for now.  Hyperlipidemia: No recent lipid panel in our system.  We will continue with current dose of atorvastatin.  Fasting lipid panel is recommended at the patient's earliest convenience.  Hypertension: BP well-controlled.  No medication changes at this time.  Follow-up: RTC 6-8 weeks.  Nelva Bush, MD 01/07/2019 11:42 AM

## 2019-01-07 ENCOUNTER — Encounter: Payer: Self-pay | Admitting: Internal Medicine

## 2019-01-07 DIAGNOSIS — I5032 Chronic diastolic (congestive) heart failure: Secondary | ICD-10-CM | POA: Insufficient documentation

## 2019-01-07 DIAGNOSIS — I739 Peripheral vascular disease, unspecified: Secondary | ICD-10-CM | POA: Insufficient documentation

## 2019-01-07 DIAGNOSIS — E785 Hyperlipidemia, unspecified: Secondary | ICD-10-CM | POA: Insufficient documentation

## 2019-01-07 HISTORY — DX: Peripheral vascular disease, unspecified: I73.9

## 2019-01-12 ENCOUNTER — Telehealth: Payer: Self-pay

## 2019-01-12 NOTE — Telephone Encounter (Signed)
I reviewed the cardiology note. Even though the patient is being kept on his current medications it looks like he is being ordered for an echocardiogram and potentially a stress test based on the findings of the echocardiogram. As such, it would be ideal if that work-up is completed before we proceed with endoscopic evaluation. Thanks. GM

## 2019-01-12 NOTE — Telephone Encounter (Signed)
The pt was seen by Dr End at Cardiology on 9/17. The pt states that he was "given good results"  Please advise if appt still needs to be moved

## 2019-01-12 NOTE — Telephone Encounter (Signed)
appt has been cancelled and the pt will contact our office after he has completed the cardiology work up.

## 2019-01-12 NOTE — Telephone Encounter (Signed)
-----   Message from Irving Copas., MD sent at 01/12/2019  3:39 AM EDT ----- Regarding: RE: [place] pt Erik Nicholson,Erik Nicholson had a chance to review this patient's chart prior to upcoming procedures.Can you please call the patient and let him know that we would like to hold on his current evaluation until his cardiology evaluation is complete.  We can go ahead and push out his procedures to November so that he has opportunity to complete all of his studies.We want to do what is safest for him and so please let him know that this is for safety reasons.Thanks.GM ----- Message ----- From: Osvaldo Angst, CRNA Sent: 01/11/2019   7:06 PM EDT To: Irving Copas., MD Subject: [facility] pt                                         Dr. Rush Landmark,  This pt is scheduled with you for a colonoscopy on Oct 1.  He is currently undergoing a w/u by [facility] due the intermittent chest pressure. Could we either delay his colonoscopy until completion of the w/u or request cardiac clearance?  Thanks,  Osvaldo Angst

## 2019-01-17 ENCOUNTER — Telehealth: Payer: Self-pay | Admitting: *Deleted

## 2019-01-17 ENCOUNTER — Ambulatory Visit
Payer: Medicare Other | Attending: Student in an Organized Health Care Education/Training Program | Admitting: Student in an Organized Health Care Education/Training Program

## 2019-01-17 ENCOUNTER — Encounter: Payer: Self-pay | Admitting: Student in an Organized Health Care Education/Training Program

## 2019-01-17 ENCOUNTER — Other Ambulatory Visit: Payer: Self-pay

## 2019-01-17 VITALS — BP 136/69 | HR 54 | Temp 98.7°F | Resp 16 | Ht 67.0 in | Wt 130.0 lb

## 2019-01-17 DIAGNOSIS — M5416 Radiculopathy, lumbar region: Secondary | ICD-10-CM

## 2019-01-17 DIAGNOSIS — M5136 Other intervertebral disc degeneration, lumbar region: Secondary | ICD-10-CM | POA: Insufficient documentation

## 2019-01-17 DIAGNOSIS — I739 Peripheral vascular disease, unspecified: Secondary | ICD-10-CM | POA: Insufficient documentation

## 2019-01-17 DIAGNOSIS — I2510 Atherosclerotic heart disease of native coronary artery without angina pectoris: Secondary | ICD-10-CM | POA: Diagnosis present

## 2019-01-17 DIAGNOSIS — K51919 Ulcerative colitis, unspecified with unspecified complications: Secondary | ICD-10-CM | POA: Diagnosis present

## 2019-01-17 DIAGNOSIS — G894 Chronic pain syndrome: Secondary | ICD-10-CM | POA: Diagnosis present

## 2019-01-17 DIAGNOSIS — Z9889 Other specified postprocedural states: Secondary | ICD-10-CM | POA: Insufficient documentation

## 2019-01-17 DIAGNOSIS — Z8619 Personal history of other infectious and parasitic diseases: Secondary | ICD-10-CM | POA: Insufficient documentation

## 2019-01-17 DIAGNOSIS — Z7902 Long term (current) use of antithrombotics/antiplatelets: Secondary | ICD-10-CM | POA: Diagnosis present

## 2019-01-17 DIAGNOSIS — G8929 Other chronic pain: Secondary | ICD-10-CM | POA: Diagnosis present

## 2019-01-17 HISTORY — DX: Other chronic pain: G89.29

## 2019-01-17 HISTORY — DX: Other specified postprocedural states: Z98.890

## 2019-01-17 NOTE — Progress Notes (Signed)
Safety precautions to be maintained throughout the outpatient stay will include: orient to surroundings, keep bed in low position, maintain call bell within reach at all times, provide assistance with transfer out of bed and ambulation.  

## 2019-01-17 NOTE — Telephone Encounter (Signed)
-----   Message from Emily Filbert, RN sent at 01/09/2019  7:55 AM EDT -----  ----- Message ----- From: Nelva Bush, MD Sent: 01/07/2019  11:45 AM EDT To: [facility] T[facility]  I just noticed that [name] has not had a recent lipid panel.  Could you arrange for him to have this done at his convenience?  Thanks.  Gerald Stabs

## 2019-01-17 NOTE — Telephone Encounter (Addendum)
Spoke with patient. He is having lab work ordered by Dr Linus Orn [name] this Thursday 10/1. She ordered LIPID panel as well. Instructed patient to continue with plan for lab work as directed by his PCP. He verbalized understanding.

## 2019-01-17 NOTE — Progress Notes (Signed)
Patient's Name: Erik Nicholson  MRN: [id number]  Referring Provider: Orland Mustard *  DOB: [birth year]  PCP: [name], [name], MD  DOS: 01/17/2019  Note by: Gillis Santa, MD  Service setting: Ambulatory outpatient  Specialty: Interventional Pain Management  Location: [facility] (AMB) Pain Management Facility  Visit type: Initial Patient Evaluation  Patient type: New Patient   Primary Reason(s) for Visit: Encounter for initial evaluation of one or more chronic problems (new to examiner) potentially causing chronic pain, and posing a threat to normal musculoskeletal function. (Level of risk: High) CC: Hip Pain (right)  HPI  [name] is a 68 y.o. year old, male patient, who comes today to see [place] for the first time for an initial evaluation of his chronic pain. He has Essential hypertension; Other chronic pain; Ulcerative colitis with complication ([place]); Coronary artery disease involving native coronary artery of native heart without angina pectoris; PVD (peripheral vascular disease) ([place]); Benign prostatic hyperplasia with post-void dribbling; Urinary incontinence; Disorder of ejaculation; Abnormal weight loss; Ulcerative chronic pancolitis without complications ([place]); Hx of hepatitis C; Rectal urgency; Other hemorrhoids; Abdominal gas pain; Antiplatelet or antithrombotic long-term use; Intestinal metaplasia of gastric mucosa; Chronic heart failure with preserved ejection fraction (HFpEF) ([place]); Claudication in peripheral vascular disease ([place]); Hyperlipidemia LDL goal <70; Lumbar degenerative disc disease; Chronic radicular lumbar pain (Right); and History of lumbar surgery (x2) on their problem list. Today he comes in for evaluation of his Hip Pain (right)  Pain Assessment: Location: Right Hip Radiating: right leg to the foot Onset: More than a month ago Duration: Chronic pain Quality: Burning, Tingling Severity: 8 /10 (subjective, self-reported pain score)  Note: Reported level is  inconsistent with clinical observations.  Effect on ADL: difficulty performing daily activities Timing: Constant Modifying factors: nothing BP: 136/69  HR: (!) 54  Onset and Duration: Sudden and Present longer than 3 months Cause of pain: Unknown Severity: No change since onset, NAS-11 at its worse: 10/10, NAS-11 at its best: 7/10, NAS-11 now: 8/10 and NAS-11 on the average: 8/10 Timing: Night, After activity or exercise and After a period of immobility Aggravating Factors: Prolonged sitting and Walking Alleviating Factors: Medications, Resting and TENS Associated Problems: Inability to control bowel, Spasms, Tingling, Pain that wakes patient up and Pain that does not allow patient to sleep Quality of Pain: Burning, Constant, Sharp, Shooting and Tingling Previous Examinations or Tests: Biopsy, CT scan, Endoscopy, MRI scan, Nerve block, X-rays, Nerve conduction test, Neurological evaluation, Neurosurgical evaluation, Orthopedic evaluation, Chiropractic evaluation and Psychiatric evaluation Previous Treatments: Narcotic medications and Steroid treatments by mouth  The patient comes into the clinics today for the first time for a chronic pain management evaluation.   Patient is a pleasant 68 year old male who presents with a chief complaint of low back pain which radiates into his right hip and down his right leg in a posterior lateral distribution down towards his great toe.  Patient has a history of 2 prior lumbar spine surgeries.  His first 1 was over 30 years ago and then due to a fall that the patient had given right leg weakness, he had his second lumbar spine surgery.  Patient has had multiple injections in his lumbar spine including epidurals and facet nerve blocks.  He was previously being seen in [place].  He was offered a spinal cord stimulator trial given difficult to manage pain.  Patient is not interested in any implantable therapies, additional surgery, injections.  He does utilize  Percocet 5 mg 3 times daily  as needed which he states has a mild impact on his pain.  He describes his pain as burning, tingling, sharp, shooting.  He has done physical therapy in the past and is also tried TENS unit without much benefit.  Patient also has an impressive cardiovascular history.  He has multiple coronary stents and also has lower extremity stents due to peripheral vascular disease.  Patient is on Plavix as a result.  He is high risk to be off.  Historic Controlled Substance Pharmacotherapy Review  Percocet 5 mg 3 times daily as needed  11/24/2018  1   11/24/2018  Oxycodone-Acetaminophen 5-325  15.00  5 Tr Mcl   [id number]   [place] ([id number])   0  22.50 MME  Private Pay   [place]     Pharmacodynamics: Desired effects: Analgesia: The patient reports <50% benefit. Reported improvement in function: The patient reports medication allows him to accomplish basic ADLs. Clinically meaningful improvement in function (CMIF): Sustained CMIF goals met Perceived effectiveness: Described as relatively effective but with some room for improvement Undesirable effects: Side-effects or Adverse reactions: None reported Historical Monitoring: The patient  reports previous drug use. List of all UDS Test(s): No results found for: MDMA, COCAINSCRNUR, [place], [place], CANNABQUANT, THCU, [place] List of other Serum/Urine Drug Screening Test(s):  No results found for: AMPHSCRSER, BARBSCRSER, BENZOSCRSER, COCAINSCRSER, COCAINSCRNUR, PCPSCRSER, PCPQUANT, THCSCRSER, THCU, CANNABQUANT, OPIATESCRSER, OXYSCRSER, PROPOXSCRSER, ETH Historical Background Evaluation: Erik Nicholson PMP: PDMP reviewed during this encounter. Six (6) year initial data search conducted.             Index Department of public safety, offender search: Editor, commissioning Information) Non-contributory Risk Assessment Profile: Aberrant behavior: None observed or detected today Risk factors for fatal opioid overdose: None identified today Fatal overdose hazard ratio (HR):  Calculation deferred Non-fatal overdose hazard ratio (HR): Calculation deferred Risk of opioid abuse or dependence: 0.7-3.0% with doses ? 36 MME/day and 6.1-26% with doses ? 120 MME/day. Substance use disorder (SUD) risk level: See below Personal History of Substance Abuse (SUD-Substance use disorder):  Alcohol: Negative  Illegal Drugs: Positive Male or Male(none since 1960's)  Rx Drugs: Negative  ORT Risk Level calculation: Moderate Risk Opioid Risk Tool - 01/17/19 0909      Personal History of Substance Abuse   Alcohol  Negative    Illegal Drugs  Positive Male or Male   none since 43's   Rx Drugs  Negative      Age   Age between 57-45 years   No      History of Preadolescent Sexual Abuse   History of Preadolescent Sexual Abuse  Negative or Male      Psychological Disease   Psychological Disease  Negative    Depression  Negative      Total Score   Opioid Risk Tool Scoring  4    Opioid Risk Interpretation  Moderate Risk      ORT Scoring interpretation table:  Score <3 = Low Risk for SUD  Score between 4-7 = Moderate Risk for SUD  Score >8 = High Risk for Opioid Abuse   PHQ-2 Depression Scale:  Total score: 0  PHQ-2 Scoring interpretation table: (Score and probability of major depressive disorder)  Score 0 = No depression  Score 1 = 15.4% Probability  Score 2 = 21.1% Probability  Score 3 = 38.4% Probability  Score 4 = 45.5% Probability  Score 5 = 56.4% Probability  Score 6 = 78.6% Probability   PHQ-9 Depression Scale:  Total score: 0  PHQ-9  Scoring interpretation table:  Score 0-4 = No depression  Score 5-9 = Mild depression  Score 10-14 = Moderate depression  Score 15-19 = Moderately severe depression  Score 20-27 = Severe depression (2.4 times higher risk of SUD and 2.89 times higher risk of overuse)   Pharmacologic Plan: As per protocol, I have not taken over any controlled substance management, pending the results of ordered tests and/or consults.             Initial impression: Pending review of available data and ordered tests.  Meds   Current Outpatient Medications:  .  atorvastatin (LIPITOR) 20 MG tablet, Take 20 mg by mouth daily at 6 PM. , Disp: , Rfl:  .  baclofen (LIORESAL) 10 MG tablet, Take 10 mg by mouth at bedtime. , Disp: , Rfl:  .  clopidogrel (PLAVIX) 75 MG tablet, Take 75 mg by mouth every other day. , Disp: , Rfl:  .  famotidine (PEPCID) 40 MG tablet, Take 1 tablet (40 mg total) by mouth 2 (two) times daily., Disp: 60 tablet, Rfl: 3 .  ferrous sulfate 325 (65 FE) MG EC tablet, Take 325 mg by mouth daily., Disp: , Rfl:  .  hyoscyamine (LEVBID) 0.375 MG 12 hr tablet, Take 0.375 mg by mouth 2 (two) times daily. , Disp: , Rfl:  .  lidocaine (LIDODERM) 5 %, , Disp: , Rfl:  .  mesalamine (LIALDA) 1.2 g EC tablet, Take 2 tablets (2.4 g total) by mouth daily with breakfast., Disp: 30 tablet, Rfl: 3 .  metoprolol succinate (TOPROL-XL) 50 MG 24 hr tablet, Take 1 tablet (50 mg total) by mouth daily., Disp: 90 tablet, Rfl: 3 .  Multiple Vitamin (MULTIVITAMIN) tablet, Take 1 tablet by mouth daily., Disp: , Rfl:  .  nitroGLYCERIN (NITRODUR - DOSED IN MG/24 HR) 0.4 mg/hr patch, Place 0.4 mg onto the skin daily as needed., Disp: , Rfl:  .  oxyCODONE-acetaminophen (PERCOCET/ROXICET) 5-325 MG tablet, Take 1 tablet by mouth every 8 (eight) hours as needed., Disp: 15 tablet, Rfl: 0 .  PREDNISONE PO, Take by mouth as needed., Disp: , Rfl:  .  RESTASIS 0.05 % ophthalmic emulsion, Place 1 drop into both eyes 2 (two) times daily. , Disp: , Rfl:  .  telmisartan-hydrochlorothiazide (MICARDIS HCT) 40-12.5 MG tablet, Take 1 tablet by mouth daily., Disp: 90 tablet, Rfl: 3   ROS  Cardiovascular: High blood pressure, Chest pain, Heart catheterization and Blood thinners:  Anticoagulant Pulmonary or Respiratory: Shortness of breath Neurological: Incontinence:  Urinary Review of Past Neurological Studies: No results found for this or any previous  visit. Psychological-Psychiatric: No reported psychological or psychiatric signs or symptoms such as difficulty sleeping, anxiety, depression, delusions or hallucinations (schizophrenial), mood swings (bipolar disorders) or suicidal ideations or attempts Gastrointestinal: Heartburn due to stomach pushing into lungs (Hiatal hernia), Alternating episodes iof diarrhea and constipation (IBS-Irritable bowe syndrome) and Inflamed liver (Hepatitis) Genitourinary: Difficulty emptying the bladder or controlling the flow of urine (Neurogenic bladder) Hematological: Weakness due to low blood hemoglobin or red blood cell count (Anemia) Endocrine: No reported endocrine signs or symptoms such as high or low blood sugar, rapid heart rate due to high thyroid levels, obesity or weight gain due to slow thyroid or thyroid disease Rheumatologic: No reported rheumatological signs and symptoms such as fatigue, joint pain, tenderness, swelling, redness, heat, stiffness, decreased range of motion, with or without associated rash Musculoskeletal: Negative for myasthenia gravis, muscular dystrophy, multiple sclerosis or malignant hyperthermia+ Work History: Retired  Allergies  Mr. Dennise Bamber has no allergies on file.  Laboratory Chemistry Profile   Screening No results found for: SARSCOV2NAA, COVIDSOURCE, STAPHAUREUS, MRSAPCR, HCVAB, HIV, PREGTESTUR  Inflammation (CRP: Acute Phase) (ESR: Chronic Phase) Lab Results  Component Value Date   ESRSEDRATE 5 12/22/2018                         Rheumatology No results found for: RF, ANA, LABURIC, URICUR, LYMEIGGIGMAB, LYMEABIGMQN, HLAB27                      Renal Lab Results  Component Value Date   BUN 20 12/22/2018   CREATININE 0.85 12/22/2018   GFR 89.04 12/22/2018                             Hepatic Lab Results  Component Value Date   AST 20 12/22/2018   ALT 13 12/22/2018   ALBUMIN 4.3 12/22/2018   ALKPHOS 50 12/22/2018                         Electrolytes Lab Results  Component Value Date   NA 140 12/22/2018   K 4.4 12/22/2018   CL 105 12/22/2018   CALCIUM 9.8 12/22/2018                        Neuropathy Lab Results  Component Value Date   VITAMINB12 910 12/22/2018   FOLATE >24.7 12/22/2018                        CNS No results found for: COLORCSF, APPEARCSF, RBCCOUNTCSF, WBCCSF, POLYSCSF, LYMPHSCSF, EOSCSF, PROTEINCSF, GLUCCSF, JCVIRUS, CSFOLI, IGGCSF, LABACHR, ACETBL                      Bone No results found for: VD25OH, [id number], OY7741OI7, OM7672CN4, 25OHVITD1, 25OHVITD2, 25OHVITD3, TESTOFREE, TESTOSTERONE                       Coagulation Lab Results  Component Value Date   INR 1.1 (H) 12/22/2018   LABPROT 12.5 12/22/2018   PLT 201.0 12/22/2018                        Cardiovascular Lab Results  Component Value Date   HGB 12.6 (L) 12/22/2018   HCT 38.1 (L) 12/22/2018                         ID No results found for: LYMEIGGIGMAB, HIV, SARSCOV2NAA, STAPHAUREUS, MRSAPCR, HCVAB, PREGTESTUR, MICROTEXT  Cancer No results found for: CEA, CA125, LABCA2                      Endocrine No results found for: TSH, FREET4, TESTOFREE, TESTOSTERONE, SHBG, ESTRADIOL, ESTRADIOLPCT, ESTRADIOLFRE, LABPREG, ACTH                      Note: Lab results reviewed.  [place]  Drug: [name]  reports previous drug use. Alcohol:  reports current alcohol use. Tobacco:  reports that he has quit smoking. He has never used smokeless tobacco. Medical:  has a past medical history of BPH (benign prostatic hyperplasia), CAD (coronary artery disease), Chronic back pain, hepatitis C, Neuropathy, PVD (peripheral vascular disease) ([place]),  and Ulcerative colitis ([place]). Family: family history includes Cancer in his mother; Diabetes in his sister; Heart disease in his mother; Stroke in his mother.  Past Surgical History:  Procedure Laterality Date  . back surgery     x 2 lumbar last 09/2016 in [place]  . CARDIAC  CATHETERIZATION    . COLONOSCOPY     last 09/2018 in [place]  . HERNIA REPAIR    . PENILE PROSTHESIS IMPLANT    . PERCUTANEOUS CORONARY STENT INTERVENTION (PCI-S)     x 7-8 heart  . PERIPHERAL ARTERIAL STENT GRAFT    . pvd     with stenting x 2 right leg below knee and 1 left thigh   . SHOULDER ARTHROSCOPY Bilateral    Active Ambulatory Problems    Diagnosis Date Noted  . Essential hypertension 11/24/2018  . Other chronic pain 11/24/2018  . Ulcerative colitis with complication ([place]) 33/00/7622  . Coronary artery disease involving native coronary artery of native heart without angina pectoris 11/24/2018  . PVD (peripheral vascular disease) ([place]) 11/24/2018  . Benign prostatic hyperplasia with post-void dribbling 11/24/2018  . Urinary incontinence 11/24/2018  . Disorder of ejaculation 11/24/2018  . Abnormal weight loss 11/24/2018  . Ulcerative chronic pancolitis without complications ([place]) 63/33/5456  . Hx of hepatitis C 12/23/2018  . Rectal urgency 12/23/2018  . Other hemorrhoids 12/23/2018  . Abdominal gas pain 12/23/2018  . Antiplatelet or antithrombotic long-term use 12/23/2018  . Intestinal metaplasia of gastric mucosa 12/23/2018  . Chronic heart failure with preserved ejection fraction (HFpEF) ([place]) 01/07/2019  . Claudication in peripheral vascular disease ([place]) 01/07/2019  . Hyperlipidemia LDL goal <70 01/07/2019  . Lumbar degenerative disc disease 01/17/2019  . Chronic radicular lumbar pain (Right) 01/17/2019  . History of lumbar surgery (x2) 01/17/2019   Resolved Ambulatory Problems    Diagnosis Date Noted  . No Resolved Ambulatory Problems   Past Medical History:  Diagnosis Date  . BPH (benign prostatic hyperplasia)   . CAD (coronary artery disease)   . Chronic back pain   . Neuropathy   . Ulcerative colitis ([place])    Constitutional Exam  General appearance: Well nourished, well developed, and well hydrated. In no apparent acute distress Vitals:    01/17/19 0901  BP: 136/69  Pulse: (!) 54  Resp: 16  Temp: 98.7 F (37.1 C)  TempSrc: Oral  SpO2: 100%  Weight: 130 lb (59 kg)  Height: 5' 7"  (1.702 m)   BMI Assessment: Estimated body mass index is 20.36 kg/m as calculated from the following:   Height as of this encounter: 5' 7"  (1.702 m).   Weight as of this encounter: 130 lb (59 kg).  BMI interpretation table: BMI level Category Range association with higher incidence of chronic pain  <18 kg/m2 Underweight   18.5-24.9 kg/m2 Ideal body weight   25-29.9 kg/m2 Overweight Increased incidence by 20%  30-34.9 kg/m2 Obese (Class I) Increased incidence by 68%  35-39.9 kg/m2 Severe obesity (Class II) Increased incidence by 136%  >40 kg/m2 Extreme obesity (Class III) Increased incidence by 254%   Patient's current BMI Ideal Body weight  Body mass index is 20.36 kg/m. Ideal body weight: 66.1 kg (145 lb 11.6 oz)   BMI Readings from Last 4 Encounters:  01/17/19 20.36 kg/m  01/05/19 21.69 kg/m  12/22/18 21.46 kg/m   Wt Readings from Last 4 Encounters:  01/17/19 130 lb (59 kg)  01/05/19 138 lb 8 oz (62.8 kg)  12/22/18 137 lb (62.1 kg)  Psych/Mental status: Alert, oriented x 3 (person, place, & time)       Eyes: PERLA Respiratory: No evidence of acute respiratory distress  Cervical Spine Area Exam  Skin & Axial Inspection: No masses, redness, edema, swelling, or associated skin lesions Alignment: Symmetrical Functional ROM: Unrestricted ROM      Stability: No instability detected Muscle Tone/Strength: Functionally intact. No obvious neuro-muscular anomalies detected. Sensory (Neurological): Unimpaired Palpation: No palpable anomalies              Upper Extremity (UE) Exam    Side: Right upper extremity  Side: Left upper extremity  Skin & Extremity Inspection: Skin color, temperature, and hair growth are WNL. No peripheral edema or cyanosis. No masses, redness, swelling, asymmetry, or associated skin lesions. No contractures.   Skin & Extremity Inspection: Skin color, temperature, and hair growth are WNL. No peripheral edema or cyanosis. No masses, redness, swelling, asymmetry, or associated skin lesions. No contractures.  Functional ROM: Unrestricted ROM          Functional ROM: Unrestricted ROM          Muscle Tone/Strength: Functionally intact. No obvious neuro-muscular anomalies detected.  Muscle Tone/Strength: Functionally intact. No obvious neuro-muscular anomalies detected.  Sensory (Neurological): Unimpaired          Sensory (Neurological): Unimpaired          Palpation: No palpable anomalies              Palpation: No palpable anomalies              Provocative Test(s):  Phalen's test: deferred Tinel's test: deferred Apley's scratch test (touch opposite shoulder):  Action 1 (Across chest): deferred Action 2 (Overhead): deferred Action 3 (LB reach): deferred   Provocative Test(s):  Phalen's test: deferred Tinel's test: deferred Apley's scratch test (touch opposite shoulder):  Action 1 (Across chest): deferred Action 2 (Overhead): deferred Action 3 (LB reach): deferred    Thoracic Spine Area Exam  Skin & Axial Inspection: No masses, redness, or swelling Alignment: Symmetrical Functional ROM: Unrestricted ROM Stability: No instability detected Muscle Tone/Strength: Functionally intact. No obvious neuro-muscular anomalies detected. Sensory (Neurological): Unimpaired Muscle strength & Tone: No palpable anomalies  Lumbar Spine Area Exam  Skin & Axial Inspection: Well healed scar from previous spine surgery detected Alignment: Symmetrical Functional ROM: Pain restricted ROM affecting primarily the right Stability: No instability detected Muscle Tone/Strength: Functionally intact. No obvious neuro-muscular anomalies detected. Sensory (Neurological): Dermatomal pain pattern right L5, S1 Palpation: Complains of area being tender to palpation       Provocative Tests: Hyperextension/rotation test: (+)  due to pain.  Right greater than left Lumbar quadrant test (Kemp's test): (+) on the right for foraminal stenosis Lateral bending test: (+) ipsilateral radicular pain, on the right. Positive for right-sided foraminal stenosis. Patrick's Maneuver: (+) for right-sided S-I arthralgia             FABER* test: deferred today                   S-I anterior distraction/compression test: deferred today         S-I lateral compression test: deferred today         S-I Thigh-thrust test: deferred today         S-I Gaenslen's test: deferred today         *(Flexion, ABduction and External Rotation)  Gait & Posture Assessment  Ambulation: Unassisted Gait: Relatively normal for age and body habitus Posture: WNL  Lower Extremity Exam    Side: Right lower extremity  Side: Left lower extremity  Stability: No instability observed          Stability: No instability observed          Skin & Extremity Inspection: Skin color, temperature, and hair growth are WNL. No peripheral edema or cyanosis. No masses, redness, swelling, asymmetry, or associated skin lesions. No contractures.  Skin & Extremity Inspection: Skin color, temperature, and hair growth are WNL. No peripheral edema or cyanosis. No masses, redness, swelling, asymmetry, or associated skin lesions. No contractures.  Functional ROM: Pain restricted ROM for all joints of the lower extremity          Functional ROM: Unrestricted ROM                  Muscle Tone/Strength: Functionally intact. No obvious neuro-muscular anomalies detected.  Muscle Tone/Strength: Functionally intact. No obvious neuro-muscular anomalies detected.  Sensory (Neurological): Dermatomal pain pattern        Sensory (Neurological): Unimpaired        DTR: Patellar: 1+: trace Achilles: 1+: trace Plantar: deferred today  DTR: Patellar: 1+: trace Achilles: 1+: trace Plantar: deferred today  Palpation: No palpable anomalies  Palpation: No palpable anomalies   Assessment  Primary  Diagnosis & Pertinent Problem List: The primary encounter diagnosis was Chronic pain syndrome. Diagnoses of History of lumbar surgery (x2), Chronic radicular lumbar pain (Right), Lumbar degenerative disc disease, Claudication in peripheral vascular disease ([place]), Hx of hepatitis C, Antiplatelet or antithrombotic long-term use, Coronary artery disease involving native coronary artery of native heart without angina pectoris, Ulcerative colitis with complication, unspecified location [facility]), and PVD (peripheral vascular disease) ([place]) were also pertinent to this visit.  Visit Diagnosis (New problems to examiner): 1. Chronic pain syndrome   2. History of lumbar surgery (x2)   3. Chronic radicular lumbar pain (Right)   4. Lumbar degenerative disc disease   5. Claudication in peripheral vascular disease ([place])   6. Hx of hepatitis C   7. Antiplatelet or antithrombotic long-term use   8. Coronary artery disease involving native coronary artery of native heart without angina pectoris   9. Ulcerative colitis with complication, unspecified location ([place])   10. PVD (peripheral vascular disease) ([place])    68 year old male with history of lumbar spine surgery, chronic lumbar radicular pain along the right side, peripheral vascular disease and associated claudication, coronary artery disease with multiple coronary stents in place who presents for pain management.  Patient previously moved from [place].  He was being followed by pain management there.  Recommend that he try and obtain clinic records from there and send them to [place] so we can understand all that has been done in the past.  Also recommend that patient send [place] any previous MRI or CT scan studies that he has had of his spine.  Patient was fairly knowledgeable as to previous interventions he has received.  He states that the epidural injections became less effective over time and that he is not interested in spinal cord stimulator trial.  He does find very  mild benefit with Percocet.  We could consider alternatives which would have a better side effect profile and could potentially provide better analgesic benefit.  We discussed buprenorphine therapy.  Can discuss further pending UDS and psych evaluation.  We will see patient back after he has completed both urine toxicology screen and psychological evaluation.  Plan of Care (Initial workup plan)  Note: [name]  was reminded that as per protocol, today's visit has been an evaluation only. We have not taken over the patient's controlled substance management.  General Recommendations: The pain condition that the patient suffers from is best treated with a multidisciplinary approach that involves an increase in physical activity to prevent de-conditioning and worsening of the pain cycle, as well as psychological counseling (formal and/or informal) to address the co-morbid psychological affects of pain. Treatment will often involve judicious use of pain medications and interventional procedures to decrease the pain, allowing the patient to participate in the physical activity that will ultimately produce long-lasting pain reductions. The goal of the multidisciplinary approach is to return the patient to a higher level of overall function and to restore their ability to perform activities of daily living.   Lab Orders     Compliance Drug Analysis, Ur  Referral Orders     Ambulatory referral to Psychology  Pharmacological management options:  Opioid Analgesics: The patient was informed that there is no guarantee that he would be a candidate for opioid analgesics. The decision will be made following CDC guidelines. This decision will be based on the results of diagnostic studies, as well as Mr. Van Hagen's risk profile.  Consider buprenorphine, tramadol.  Membrane stabilizer: To be determined at a later time  Muscle relaxant: To be determined at a later time  NSAID: Avoid as patient is on Plavix.   Minimize bleeding risk  Other analgesic(s): To be determined at a later time   Interventional management options: [name] was informed that there is no guarantee that he would be a candidate for interventional therapies. The decision will be based on the results of diagnostic studies, as well as Mr. Van Hagen's risk profile.  Procedure(s) under consideration:  Patient has tried multiple epidural injections facet blocks and joint injections in [place].  Was offered spinal cord stimulator trial there.  Declined.  Is not interested in any interventional therapies.   Provider-requested follow-up: Return for After Psychological evaluation.  Future Appointments  Date Time Provider [place]  01/19/2019  9:15 AM [facility]  01/27/2019 10:30 AM [name], [name], MD [facility]    Primary Care Physician: [name], [name], MD Location: [facility] Note by: Gillis Santa, MD Date: 01/17/2019; Time: 3:56 PM  Note: This dictation was prepared with Dragon dictation. Any transcriptional errors that may result from this process are unintentional.

## 2019-01-19 ENCOUNTER — Other Ambulatory Visit: Payer: Self-pay

## 2019-01-19 ENCOUNTER — Other Ambulatory Visit (INDEPENDENT_AMBULATORY_CARE_PROVIDER_SITE_OTHER): Payer: Medicare Other

## 2019-01-19 ENCOUNTER — Encounter: Payer: Medicare Other | Admitting: Gastroenterology

## 2019-01-19 DIAGNOSIS — N3943 Post-void dribbling: Secondary | ICD-10-CM | POA: Diagnosis not present

## 2019-01-19 DIAGNOSIS — N401 Enlarged prostate with lower urinary tract symptoms: Secondary | ICD-10-CM

## 2019-01-19 DIAGNOSIS — Z125 Encounter for screening for malignant neoplasm of prostate: Secondary | ICD-10-CM

## 2019-01-19 DIAGNOSIS — Z13818 Encounter for screening for other digestive system disorders: Secondary | ICD-10-CM

## 2019-01-19 DIAGNOSIS — Z113 Encounter for screening for infections with a predominantly sexual mode of transmission: Secondary | ICD-10-CM

## 2019-01-19 DIAGNOSIS — R634 Abnormal weight loss: Secondary | ICD-10-CM | POA: Diagnosis not present

## 2019-01-19 DIAGNOSIS — I10 Essential (primary) hypertension: Secondary | ICD-10-CM

## 2019-01-19 DIAGNOSIS — E559 Vitamin D deficiency, unspecified: Secondary | ICD-10-CM

## 2019-01-19 DIAGNOSIS — Z1329 Encounter for screening for other suspected endocrine disorder: Secondary | ICD-10-CM | POA: Diagnosis not present

## 2019-01-19 LAB — COMPREHENSIVE METABOLIC PANEL
ALT: 14 U/L (ref 0–53)
AST: 18 U/L (ref 0–37)
Albumin: 4.5 g/dL (ref 3.5–5.2)
Alkaline Phosphatase: 52 U/L (ref 39–117)
BUN: 19 mg/dL (ref 6–23)
CO2: 27 mEq/L (ref 19–32)
Calcium: 10.2 mg/dL (ref 8.4–10.5)
Chloride: 104 mEq/L (ref 96–112)
Creatinine, Ser: 0.81 mg/dL (ref 0.40–1.50)
GFR: 94.12 mL/min (ref >60.00)
Glucose, Bld: 92 mg/dL (ref 70–99)
Potassium: 4.1 mEq/L (ref 3.5–5.1)
Sodium: 140 mEq/L (ref 135–145)
Total Bilirubin: 0.8 mg/dL (ref 0.2–1.2)
Total Protein: 7.7 g/dL (ref 6.0–8.3)

## 2019-01-19 LAB — CBC WITH DIFFERENTIAL/PLATELET
Basophils Absolute: 0 10*3/uL (ref 0.0–0.1)
Basophils Relative: 0.7 % (ref 0.0–3.0)
Eosinophils Absolute: 0.1 10*3/uL (ref 0.0–0.7)
Eosinophils Relative: 1.6 % (ref 0.0–5.0)
HCT: 38.5 % — ABNORMAL LOW (ref 39.0–52.0)
Hemoglobin: 12.7 g/dL — ABNORMAL LOW (ref 13.0–17.0)
Lymphocytes Relative: 34.8 % (ref 12.0–46.0)
Lymphs Abs: 1.8 10*3/uL (ref 0.7–4.0)
MCHC: 32.9 g/dL (ref 30.0–36.0)
MCV: 102.9 fl — ABNORMAL HIGH (ref 78.0–100.0)
Monocytes Absolute: 0.5 10*3/uL (ref 0.1–1.0)
Monocytes Relative: 10.1 % (ref 3.0–12.0)
Neutro Abs: 2.7 10*3/uL (ref 1.4–7.7)
Neutrophils Relative %: 52.8 % (ref 43.0–77.0)
Platelets: 209 10*3/uL (ref 150.0–400.0)
RBC: 3.74 Mil/uL — ABNORMAL LOW (ref 4.22–5.81)
RDW: 12.6 % (ref 11.5–15.5)
WBC: 5.1 10*3/uL (ref 4.0–10.5)

## 2019-01-19 LAB — LIPID PANEL
Cholesterol: 167 mg/dL (ref 0–200)
HDL: 83.3 mg/dL (ref >39.00)
LDL Cholesterol: 68 mg/dL (ref 0–99)
NonHDL: 83.26
Total CHOL/HDL Ratio: 2
Triglycerides: 74 mg/dL (ref 0.0–149.0)
VLDL: 14.8 mg/dL (ref 0.0–40.0)

## 2019-01-19 LAB — PSA, MEDICARE: PSA: 6.93 ng/ml — ABNORMAL HIGH (ref 0.10–4.00)

## 2019-01-19 LAB — TSH: TSH: 0.51 u[IU]/mL (ref 0.35–4.50)

## 2019-01-19 LAB — VITAMIN D 25 HYDROXY (VIT D DEFICIENCY, FRACTURES): VITD: 64.33 ng/mL (ref 30.00–100.00)

## 2019-01-20 LAB — URINALYSIS, ROUTINE W REFLEX MICROSCOPIC
Bilirubin Urine: NEGATIVE
Glucose, UA: NEGATIVE
Hgb urine dipstick: NEGATIVE
Ketones, ur: NEGATIVE
Leukocytes,Ua: NEGATIVE
Nitrite: NEGATIVE
Protein, ur: NEGATIVE
Specific Gravity, Urine: 1.021 (ref 1.001–1.03)
pH: 6 (ref 5.0–8.0)

## 2019-01-20 LAB — COMPLIANCE DRUG ANALYSIS, UR

## 2019-01-23 ENCOUNTER — Telehealth: Payer: Self-pay | Admitting: *Deleted

## 2019-01-23 LAB — HEPATITIS C ANTIBODY
Hepatitis C Ab: REACTIVE — AB
SIGNAL TO CUT-OFF: 25.5 — ABNORMAL HIGH (ref <1.00)

## 2019-01-23 LAB — HCV RNA,QUANTITATIVE REAL TIME PCR
HCV Quantitative Log: <1.18 Log IU/mL
HCV RNA, PCR, QN: <15 IU/mL

## 2019-01-23 LAB — HEPATITIS B SURFACE ANTIGEN: Hepatitis B Surface Ag: NONREACTIVE

## 2019-01-23 LAB — HEPATITIS B SURFACE ANTIBODY, QUANTITATIVE: Hep B S AB Quant (Post): <5 m[IU]/mL — ABNORMAL LOW (ref >=10)

## 2019-01-23 MED ORDER — ATORVASTATIN CALCIUM 20 MG PO TABS: 20.0000 mg | ORAL_TABLET | Freq: Every day | ORAL | 2 refills | Status: DC

## 2019-01-23 NOTE — Telephone Encounter (Signed)
Called patient and he verbalized understanding. He needs refill of atorvastatin. Rx sent in. He is aware he needs the tests ordered and follow up. [place] the [place] and I suggested I will have one of the schedulers reach out to him to schedule later.   Sent to scheduling.

## 2019-01-23 NOTE — Telephone Encounter (Signed)
-----   Message from Nelva Bush, MD sent at 01/20/2019  2:12 PM EDT ----- Regarding: RE: LIPID PANEL Thanks for the update.  His LDL appears to be adequately controlled right now.  He should continue his current meds.  We will f/u with him after completion of the tests ordered at our recent visit.  Thanks.  Gerald Stabs ----- Message ----- From: Vanessa Ralphs, RN Sent: 01/17/2019  10:50 AM EDT To: Nelva Bush, MD Subject: LIPID PANEL                                    Dr End,  Patient is having lab work from PCP this Thursday and it includes a LIPID panel.   Thanks, Danise Mina  ----- Message ----- From: Emily Filbert, RN Sent: 01/09/2019   7:55 AM EDT To: Vanessa Ralphs, RN   ----- Message ----- From: Nelva Bush, MD Sent: 01/07/2019  11:45 AM EDT To: [facility] T[facility]  I just noticed that [name] has not had a recent lipid panel.  Could you arrange for him to have this done at his convenience?  Thanks.  Gerald Stabs

## 2019-01-25 ENCOUNTER — Other Ambulatory Visit: Payer: Self-pay

## 2019-01-27 ENCOUNTER — Encounter: Payer: Self-pay | Admitting: Internal Medicine

## 2019-01-27 ENCOUNTER — Ambulatory Visit (INDEPENDENT_AMBULATORY_CARE_PROVIDER_SITE_OTHER): Payer: Medicare Other | Admitting: Internal Medicine

## 2019-01-27 ENCOUNTER — Other Ambulatory Visit: Payer: Self-pay

## 2019-01-27 VITALS — BP 118/64 | HR 55 | Temp 98.1°F | Ht 67.0 in | Wt 137.1 lb

## 2019-01-27 DIAGNOSIS — Z8619 Personal history of other infectious and parasitic diseases: Secondary | ICD-10-CM

## 2019-01-27 DIAGNOSIS — I10 Essential (primary) hypertension: Secondary | ICD-10-CM

## 2019-01-27 DIAGNOSIS — K51919 Ulcerative colitis, unspecified with unspecified complications: Secondary | ICD-10-CM | POA: Diagnosis not present

## 2019-01-27 DIAGNOSIS — M5416 Radiculopathy, lumbar region: Secondary | ICD-10-CM

## 2019-01-27 DIAGNOSIS — G8929 Other chronic pain: Secondary | ICD-10-CM

## 2019-01-27 DIAGNOSIS — M5136 Other intervertebral disc degeneration, lumbar region: Secondary | ICD-10-CM

## 2019-01-27 MED ORDER — MESALAMINE 1.2 G PO TBEC: 2.4000 g | DELAYED_RELEASE_TABLET | Freq: Every day | ORAL | 3 refills | Status: DC

## 2019-01-27 MED ORDER — DULOXETINE HCL 30 MG PO CPEP: 30.0000 mg | ORAL_CAPSULE | Freq: Every day | ORAL | 0 refills | Status: DC

## 2019-01-27 MED ORDER — OXYCODONE-ACETAMINOPHEN 5-325 MG PO TABS: 1.0000 | ORAL_TABLET | Freq: Three times a day (TID) | ORAL | 0 refills | Status: DC | PRN

## 2019-01-27 NOTE — Patient Instructions (Addendum)
Dr. Havery Moros, [facility] at the [facility]   Alpha lipoic acid 600 mg 2x per day   Duloxetine delayed-release capsules What is this medicine? DULOXETINE (doo LOX e teen) is used to treat depression, anxiety, and different types of chronic pain. This medicine may be used for other purposes; ask your health care provider or pharmacist if you have questions. COMMON BRAND NAME(S): Cymbalta, Creig Hines, Irenka What should I tell my health care provider before I take this medicine? They need to know if you have any of these conditions:  bipolar disorder  glaucoma  high blood pressure  kidney disease  liver disease  seizures  suicidal thoughts, plans or attempt; a previous suicide attempt by you or a family member  take medicines that treat or prevent blood clots  taken medicines called MAOIs like Carbex, Eldepryl, Marplan, Nardil, and Parnate within 14 days  trouble passing urine  an unusual reaction to duloxetine, other medicines, foods, dyes, or preservatives  pregnant or trying to get pregnant  breast-feeding How should I use this medicine? Take this medicine by mouth with a glass of water. Follow the directions on the prescription label. Do not crush, cut or chew some capsules of this medicine. Some capsules may be opened and sprinkled on applesauce. Check with your doctor or pharmacist if you are not sure. You can take this medicine with or without food. Take your medicine at regular intervals. Do not take your medicine more often than directed. Do not stop taking this medicine suddenly except upon the advice of your doctor. Stopping this medicine too quickly may cause serious side effects or your condition may worsen. A special MedGuide will be given to you by the pharmacist with each prescription and refill. Be sure to read this information carefully each time. Talk to your pediatrician regarding the use of this medicine in children. While this drug may be  prescribed for children as young as 29 years of age for selected conditions, precautions do apply. Overdosage: If you think you have taken too much of this medicine contact a poison control center or emergency room at once. NOTE: This medicine is only for you. Do not share this medicine with others. What if I miss a dose? If you miss a dose, take it as soon as you can. If it is almost time for your next dose, take only that dose. Do not take double or extra doses. What may interact with this medicine? Do not take this medicine with any of the following medications:  desvenlafaxine  levomilnacipran  linezolid  MAOIs like Carbex, Eldepryl, Marplan, Nardil, and Parnate  methylene blue (injected into a vein)  milnacipran  thioridazine  venlafaxine This medicine may also interact with the following medications:  alcohol  amphetamines  aspirin and aspirin-like medicines  certain antibiotics like ciprofloxacin and enoxacin  certain medicines for blood pressure, heart disease, irregular heart beat  certain medicines for depression, anxiety, or psychotic disturbances  certain medicines for migraine headache like almotriptan, eletriptan, frovatriptan, naratriptan, rizatriptan, sumatriptan, zolmitriptan  certain medicines that treat or prevent blood clots like warfarin, enoxaparin, and dalteparin  cimetidine  fentanyl  lithium  NSAIDS, medicines for pain and inflammation, like ibuprofen or naproxen  phentermine  procarbazine  rasagiline  sibutramine  St. John's wort  theophylline  tramadol  tryptophan This list may not describe all possible interactions. Give your health care provider a list of all the medicines, herbs, non-prescription drugs, or dietary supplements you use. Also tell them if  you smoke, drink alcohol, or use illegal drugs. Some items may interact with your medicine. What should I watch for while using this medicine? Tell your doctor if your  symptoms do not get better or if they get worse. Visit your doctor or healthcare provider for regular checks on your progress. Because it may take several weeks to see the full effects of this medicine, it is important to continue your treatment as prescribed by your doctor. This medicine may cause serious skin reactions. They can happen weeks to months after starting the medicine. Contact your healthcare provider right away if you notice fevers or flu-like symptoms with a rash. The rash may be red or purple and then turn into blisters or peeling of the skin. Or, you might notice a red rash with swelling of the face, lips, or lymph nodes in your neck or under your arms. Patients and their families should watch out for new or worsening thoughts of suicide or depression. Also watch out for sudden changes in feelings such as feeling anxious, agitated, panicky, irritable, hostile, aggressive, impulsive, severely restless, overly excited and hyperactive, or not being able to sleep. If this happens, especially at the beginning of treatment or after a change in dose, call your healthcare provider. You may get drowsy or dizzy. Do not drive, use machinery, or do anything that needs mental alertness until you know how this medicine affects you. Do not stand or sit up quickly, especially if you are an older patient. This reduces the risk of dizzy or fainting spells. Alcohol may interfere with the effect of this medicine. Avoid alcoholic drinks. This medicine can cause an increase in blood pressure. This medicine can also cause a sudden drop in your blood pressure, which may make you feel faint and increase the chance of a fall. These effects are most common when you first start the medicine or when the dose is increased, or during use of other medicines that can cause a sudden drop in blood pressure. Check with your doctor for instructions on monitoring your blood pressure while taking this medicine. Your mouth may get  dry. Chewing sugarless gum or sucking hard candy, and drinking plenty of water, may help. Contact your doctor if the problem does not go away or is severe. What side effects may I notice from receiving this medicine? Side effects that you should report to your doctor or health care professional as soon as possible:  allergic reactions like skin rash, itching or hives, swelling of the face, lips, or tongue  anxious  breathing problems  confusion  changes in vision  chest pain  confusion  elevated mood, decreased need for sleep, racing thoughts, impulsive behavior  eye pain  fast, irregular heartbeat  feeling faint or lightheaded, falls  feeling agitated, angry, or irritable  hallucination, loss of contact with reality  high blood pressure  loss of balance or coordination  palpitations  redness, blistering, peeling or loosening of the skin, including inside the mouth  restlessness, pacing, inability to keep still  seizures  stiff muscles  suicidal thoughts or other mood changes  trouble passing urine or change in the amount of urine  trouble sleeping  unusual bleeding or bruising  unusually weak or tired  vomiting  yellowing of the eyes or skin Side effects that usually do not require medical attention (report to your doctor or health care professional if they continue or are bothersome):  change in sex drive or performance  change in appetite or weight  constipation  dizziness  dry mouth  headache  increased sweating  nausea  tired This list may not describe all possible side effects. Call your doctor for medical advice about side effects. You may report side effects to FDA at 1-800-FDA-1088. Where should I keep my medicine? Keep out of the reach of children. Store at room temperature between 15 and 30 degrees C (59 to 86 degrees F). Throw away any unused medicine after the expiration date. NOTE: This sheet is a summary. It may not cover  all possible information. If you have questions about this medicine, talk to your doctor, pharmacist, or health care provider.  2020 [employer]/Gold Standard (2018-07-07 13:47:50)

## 2019-01-27 NOTE — Progress Notes (Signed)
Chief Complaint  Patient presents with  . Follow-up   F/u  1. UC with pancolitis pending repeat EGD/colonoscopy after cards clearance and echo 03/02/19 reports mesalamine 1.2 x 2 pills in the am is cheaper mail order will [facility] so he is aware pt has been on this medication chronically and it helps. HE denies abdominal pain today or flare of UC  2. Chronic pain f/u Dr. [facility] pain clinic and not Rx pain meds until psych eval which pt has been cutting percocet tid prn in 1/2 doses to stretch until pain clinic can refill. C/o chronic back pain with pain rad down right leg with right leg weakness at times and tingling no help in the past with lyrica and gabapentin  3. Elevated PSA 6.92, s/p GU procedure in [place] c/w crooked penis, unable to ejaculate and urinary leakage appt with urology in [place] scheduled.  4. Chronic sob with exertion pending cards w/o with echo  5. HTN controlled today on toprol 50 mg qd, micardis 40-12.5    Review of Systems  Constitutional: Positive for weight loss.       Over years 50 lb wt loss (2+ years)   HENT: Negative for hearing loss.   Eyes: Negative for blurred vision.  Respiratory: Positive for shortness of breath.        Sob with exertion    Cardiovascular: Negative for chest pain.  Gastrointestinal: Negative for abdominal pain.  Musculoskeletal: Positive for back pain.  Skin: Negative for rash.  Neurological: Positive for sensory change and weakness.  Psychiatric/Behavioral: Negative for depression.   Past Medical History:  Diagnosis Date  . BPH (benign prostatic hyperplasia)   . CAD (coronary artery disease)    x 7 cardiac stents  . Chronic back pain    L4/5 with chronic right leg pain   . Hx of hepatitis C    treated with Harvoni in 2014  . Neuropathy   . PVD (peripheral vascular disease) ([place])    2 stents right leg and 1 in left   . Ulcerative colitis ([place])    with diarrhea   Past Surgical History:  Procedure Laterality Date  . back  surgery     x 2 lumbar last 09/2016 in [place]  . CARDIAC CATHETERIZATION    . COLONOSCOPY     last 09/2018 in [place]  . HERNIA REPAIR     right   . PENILE PROSTHESIS IMPLANT    . PERCUTANEOUS CORONARY STENT INTERVENTION (PCI-S)     x 7-8 heart  . PERIPHERAL ARTERIAL STENT GRAFT    . pvd     with stenting x 2 right leg below knee and 1 left thigh   . SHOULDER ARTHROSCOPY Bilateral    Family History  Problem Relation Age of Onset  . Cancer Mother        breast  . Heart disease Mother   . Stroke Mother   . Diabetes Sister   . Colon cancer Neg Hx   . Esophageal cancer Neg Hx   . Inflammatory bowel disease Neg Hx   . Liver disease Neg Hx   . Pancreatic cancer Neg Hx   . Rectal cancer Neg Hx   . Stomach cancer Neg Hx    Social History   Socioeconomic History  . Marital status: Married    Spouse name: Not on file  . Number of children: Not on file  . Years of education: Not on file  . Highest education level: Not  on file  Occupational History  . Not on file  Social Needs  . Financial resource strain: Not on file  . Food insecurity    Worry: Not on file    Inability: Not on file  . Transportation needs    Medical: Not on file    Non-medical: Not on file  Tobacco Use  . Smoking status: Former Research scientist (life sciences)  . Smokeless tobacco: Never Used  . Tobacco comment: quit in 2000s   Substance and Sexual Activity  . Alcohol use: Yes    Comment: occasional  . Drug use: Not Currently  . Sexual activity: Yes  Lifestyle  . Physical activity    Days per week: Not on file    Minutes per session: Not on file  . Stress: Not on file  Relationships  . Social Herbalist on phone: Not on file    Gets together: Not on file    Attends religious service: Not on file    Active member of club or organization: Not on file    Attends meetings of clubs or organizations: Not on file    Relationship status: Not on file  . Intimate partner violence    Fear of current  or ex partner: Not on file    Emotionally abused: Not on file    Physically abused: Not on file    Forced sexual activity: Not on file  Other Topics Concern  . Not on file  Social History Narrative   Moved from [place] in 10/2018   2 sons in 23s as of 11/24/2018    Married wife is DPR Pamala Hurry    Current Meds  Medication Sig  . atorvastatin (LIPITOR) 20 MG tablet Take 1 tablet (20 mg total) by mouth daily at 6 PM.  . baclofen (LIORESAL) 10 MG tablet Take 10 mg by mouth at bedtime.   . clopidogrel (PLAVIX) 75 MG tablet Take 75 mg by mouth every other day.   . famotidine (PEPCID) 40 MG tablet Take 1 tablet (40 mg total) by mouth 2 (two) times daily.  . ferrous sulfate 325 (65 FE) MG EC tablet Take 325 mg by mouth daily.  . hyoscyamine (LEVBID) 0.375 MG 12 hr tablet Take 0.375 mg by mouth 2 (two) times daily.   [place] Kitchen lidocaine (LIDODERM) 5 %   . mesalamine (LIALDA) 1.2 g EC tablet Take 2 tablets (2.4 g total) by mouth daily with breakfast.  . metoprolol succinate (TOPROL-XL) 50 MG 24 hr tablet Take 1 tablet (50 mg total) by mouth daily.  . Multiple Vitamin (MULTIVITAMIN) tablet Take 1 tablet by mouth daily.  . nitroGLYCERIN (NITRODUR - DOSED IN MG/24 HR) 0.4 mg/hr patch Place 0.4 mg onto the skin daily as needed.  [place] Kitchen oxyCODONE-acetaminophen (PERCOCET/ROXICET) 5-325 MG tablet Take 1 tablet by mouth every 8 (eight) hours as needed.  [place] Kitchen PREDNISONE PO Take by mouth as needed.  . RESTASIS 0.05 % ophthalmic emulsion Place 1 drop into both eyes 2 (two) times daily.   [place] Kitchen telmisartan-hydrochlorothiazide (MICARDIS HCT) 40-12.5 MG tablet Take 1 tablet by mouth daily.  . [DISCONTINUED] mesalamine (LIALDA) 1.2 g EC tablet Take 2 tablets (2.4 g total) by mouth daily with breakfast.  . [DISCONTINUED] oxyCODONE-acetaminophen (PERCOCET/ROXICET) 5-325 MG tablet Take 1 tablet by mouth every 8 (eight) hours as needed.   Not on File Recent Results (from the past 2160 hour(s))  INR/PT     Status: Abnormal    Collection Time: 12/22/18 11:22 AM  Result Value  Ref Range   INR 1.1 (H) 0.8 - 1.0 ratio   Prothrombin Time 12.5 9.6 - 13.1 sec  Folate     Status: None   Collection Time: 12/22/18 11:22 AM  Result Value Ref Range   Folate >24.7 >5.9 ng/mL  B12     Status: None   Collection Time: 12/22/18 11:22 AM  Result Value Ref Range   Vitamin B-12 910 211 - 911 pg/mL  IBC + Ferritin     Status: None   Collection Time: 12/22/18 11:22 AM  Result Value Ref Range   Iron 136 42 - 165 ug/dL   Transferrin 260.0 212.0 - 360.0 mg/dL   Saturation Ratios 37.4 20.0 - 50.0 %   Ferritin 115.9 22.0 - 322.0 ng/mL  CRP High sensitivity     Status: None   Collection Time: 12/22/18 11:22 AM  Result Value Ref Range   CRP, High Sensitivity 0.280 0.000 - 5.000 mg/L    Comment: Note:  An elevated hs-CRP (>5 mg/L) should be repeated after 2 weeks to rule out recent infection or trauma.  CBC     Status: Abnormal   Collection Time: 12/22/18 11:22 AM  Result Value Ref Range   WBC 5.4 4.0 - 10.5 K/uL   RBC 3.71 (L) 4.22 - 5.81 Mil/uL   Platelets 201.0 150.0 - 400.0 K/uL   Hemoglobin 12.6 (L) 13.0 - 17.0 g/dL   HCT 38.1 (L) 39.0 - 52.0 %   MCV 102.7 (H) 78.0 - 100.0 fl   MCHC 33.0 30.0 - 36.0 g/dL   RDW 12.4 11.5 - 15.5 %  Sedimentation rate     Status: None   Collection Time: 12/22/18 11:22 AM  Result Value Ref Range   Sed Rate 5 0 - 20 mm/hr  Comp Met (CMET)     Status: None   Collection Time: 12/22/18 11:22 AM  Result Value Ref Range   Sodium 140 135 - 145 mEq/L   Potassium 4.4 3.5 - 5.1 mEq/L   Chloride 105 96 - 112 mEq/L   CO2 29 19 - 32 mEq/L   Glucose, Bld 97 70 - 99 mg/dL   BUN 20 6 - 23 mg/dL   Creatinine, Ser 0.85 0.40 - 1.50 mg/dL   Total Bilirubin 0.9 0.2 - 1.2 mg/dL   Alkaline Phosphatase 50 39 - 117 U/L   AST 20 0 - 37 U/L   ALT 13 0 - 53 U/L   Total Protein 7.7 6.0 - 8.3 g/dL   Albumin 4.3 3.5 - 5.2 g/dL   Calcium 9.8 8.4 - 10.5 mg/dL   GFR 89.04 >60.00 mL/min  Compliance Drug  Analysis, Ur     Status: None   Collection Time: 01/17/19  1:45 PM  Result Value Ref Range   Summary Note     Comment: ==================================================================== Compliance Drug Analysis, Ur ==================================================================== Test                             Result       Flag       Units Drug Present and Declared for Prescription Verification   Oxycodone                      1174         EXPECTED   ng/mg creat   Oxymorphone  575          EXPECTED   ng/mg creat   Noroxycodone                   2262         EXPECTED   ng/mg creat   Noroxymorphone                 232          EXPECTED   ng/mg creat    Sources of oxycodone are scheduled prescription medications.    Oxymorphone, noroxycodone, and noroxymorphone are expected    metabolites of oxycodone. Oxymorphone is also available as a    scheduled prescription medication.   Baclofen                       PRESENT      EXPECTED   Acetaminophen                  PRESENT      EXPECTED   Metoprolol                     PRESENT      EXPECTED Drug Present not Declared for  Prescription Verification   Carboxy-THC                    588          UNEXPECTED ng/mg creat    Carboxy-THC is a metabolite of tetrahydrocannabinol  (THC). Source    of [facility] is most commonly illicit, but THC is also present in a    scheduled prescription medication.   Salicylate                     PRESENT      UNEXPECTED Drug Absent but Declared for Prescription Verification   Lidocaine                      Not Detected UNEXPECTED    Lidocaine, as indicated in the declared medication list, is not    always detected even when used as directed. ==================================================================== Test                      Result    Flag   Units      Ref Range   Creatinine              95               mg/dL       >=20 ==================================================================== Declared Medications:  The flagging and interpretation on this report are based on the  following declared medications.  Unexpected results may arise from  inaccuracies i n the declared medications.  **Note: The testing scope of this panel includes these medications:  Baclofen  Metoprolol  Oxycodone  **Note: The testing scope of this panel does not include small to  moderate amounts of these reported medications:  Acetaminophen  Lidocaine  **Note: The testing scope of this panel does not include the  following reported medications:  Atorvastatin  Clopidogrel  Cyclosporine  Famotidine  Hydrochlorothiazide  Hyoscyamine  Iron  Mesalamine  Multivitamin  Nitroglycerin  Prednisone  Telmisartan ==================================================================== For clinical consultation, please call [phone]. ====================================================================   PSA, Medicare ( [facility] only)     Status: Abnormal   Collection Time: 01/19/19  8:45 AM  Result Value Ref Range  PSA 6.93 (H) 0.10 - 4.00 ng/ml    Comment: Test performed using Access Hybritech PSA Assay, a parmagnetic partical, chemiluminecent immunoassay.  Hepatitis C antibody     Status: Abnormal   Collection Time: 01/19/19  8:45 AM  Result Value Ref Range   Hepatitis C Ab REACTIVE (A) NON-REACTI   SIGNAL TO CUT-OFF 25.50 (H) <1.00    Comment: . HCV antibody was reactive. The sample will be tested for HCV RNA by a Nucleic Acid Amplification Test (NAAT) to determine if the patient has a current active infection. .   Hepatitis B surface antigen     Status: None   Collection Time: 01/19/19  8:45 AM  Result Value Ref Range   Hepatitis B Surface Ag NON-REACTIVE NON-REACTI  Hepatitis B surface antibody,quantitative     Status: Abnormal   Collection Time: 01/19/19  8:45 AM  Result Value Ref Range   Hepatitis  B-Post <5 (L) > OR = 10 mIU/mL    Comment: . Patient does not have immunity to hepatitis B virus. . For additional information, please refer to http://education.questdiagnostics.com/faq/FAQ105 (This link is being provided for informational/ educational purposes only).   Vitamin D (25 hydroxy)     Status: None   Collection Time: 01/19/19  8:45 AM  Result Value Ref Range   VITD 64.33 30.00 - 100.00 ng/mL  TSH     Status: None   Collection Time: 01/19/19  8:45 AM  Result Value Ref Range   TSH 0.51 0.35 - 4.50 uIU/mL  Lipid panel     Status: None   Collection Time: 01/19/19  8:45 AM  Result Value Ref Range   Cholesterol 167 0 - 200 mg/dL    Comment: ATP III Classification       Desirable:  < 200 mg/dL               Borderline High:  200 - 239 mg/dL          High:  > = 240 mg/dL   Triglycerides 74.0 0.0 - 149.0 mg/dL    Comment: Normal:  <150 mg/dLBorderline High:  150 - 199 mg/dL   HDL 83.30 >39.00 mg/dL   VLDL 14.8 0.0 - 40.0 mg/dL   LDL Cholesterol 68 0 - 99 mg/dL   Total CHOL/HDL Ratio 2     Comment:                Men          Women1/2 Average Risk     3.4          3.3Average Risk          5.0          4.42X Average Risk          9.6          7.13X Average Risk          15.0          11.0                       NonHDL 83.26     Comment: NOTE:  Non-HDL goal should be 30 mg/dL higher than patient's LDL goal (i.e. LDL goal of < 70 mg/dL, would have non-HDL goal of < 100 mg/dL)  CBC w/Diff     Status: Abnormal   Collection Time: 01/19/19  8:45 AM  Result Value Ref Range   WBC 5.1 4.0 - 10.5 K/uL   RBC 3.74 (L)  4.22 - 5.81 Mil/uL   Hemoglobin 12.7 (L) 13.0 - 17.0 g/dL   HCT 38.5 (L) 39.0 - 52.0 %   MCV 102.9 (H) 78.0 - 100.0 fl   MCHC 32.9 30.0 - 36.0 g/dL   RDW 12.6 11.5 - 15.5 %   Platelets 209.0 150.0 - 400.0 K/uL   Neutrophils Relative % 52.8 43.0 - 77.0 %   Lymphocytes Relative 34.8 12.0 - 46.0 %   Monocytes Relative 10.1 3.0 - 12.0 %   Eosinophils Relative 1.6 0.0 - 5.0 %    Basophils Relative 0.7 0.0 - 3.0 %   Neutro Abs 2.7 1.4 - 7.7 K/uL   Lymphs Abs 1.8 0.7 - 4.0 K/uL   Monocytes Absolute 0.5 0.1 - 1.0 K/uL   Eosinophils Absolute 0.1 0.0 - 0.7 K/uL   Basophils Absolute 0.0 0.0 - 0.1 K/uL  Comprehensive metabolic panel     Status: None   Collection Time: 01/19/19  8:45 AM  Result Value Ref Range   Sodium 140 135 - 145 mEq/L   Potassium 4.1 3.5 - 5.1 mEq/L   Chloride 104 96 - 112 mEq/L   CO2 27 19 - 32 mEq/L   Glucose, Bld 92 70 - 99 mg/dL   BUN 19 6 - 23 mg/dL   Creatinine, Ser 0.81 0.40 - 1.50 mg/dL   Total Bilirubin 0.8 0.2 - 1.2 mg/dL   Alkaline Phosphatase 52 39 - 117 U/L   AST 18 0 - 37 U/L   ALT 14 0 - 53 U/L   Total Protein 7.7 6.0 - 8.3 g/dL   Albumin 4.5 3.5 - 5.2 g/dL   Calcium 10.2 8.4 - 10.5 mg/dL   GFR 94.12 >60.00 mL/min  HCV RNA, Quantitative Real Time PCR     Status: None   Collection Time: 01/19/19  8:45 AM  Result Value Ref Range   HCV RNA, PCR, QN <15 NOT DETECTED NOT DETECT IU/mL   HCV Quantitative Log <1.18 NOT DETECTED NOT DETECT Log IU/mL    Comment: . HCV RNA is not detected.  There is no laboratory  evidence of a current active HCV infection.  . This pattern of results (undetectable HCV RNA combined with reactive HCV antibody) could be consistent with a resolved past infection if the clinical history is  compatible with previous HCV exposure.  However, if no previous exposure is suspected, the reactive HCV  antibody could be a biological false positive result. . . This test was performed using Real-Time Polymerase Chain Reaction. . Reportable Range: 15 IU/mL to 100,000,000 IU/mL (1.18 Log IU/mL to 8.00 Log IU/mL). . The analytical performance characteristics of this assay have been determined by [facility].  The modifications have not been cleared or approved by the FDA. This assay has been validated pursuant to the  CLIA regulations and is used for clinical purposes.   . For more information on  this test, go to: http://education.quest[facility].com/faq/FAQ22v1 (This link is being provided for in formational/ educational purposes only.) . This assay is intended for use as an aid in the diagnosis of HCV infection and the management of HCV infected patients undergoing anti-viral therapy. .   Urinalysis, Routine w reflex microscopic     Status: None   Collection Time: 01/19/19  8:46 AM  Result Value Ref Range   Color, Urine YELLOW YELLOW   APPearance CLEAR CLEAR   Specific Gravity, Urine 1.021 1.001 - 1.03   pH 6.0 5.0 - 8.0   Glucose, UA NEGATIVE NEGATIVE  Bilirubin Urine NEGATIVE NEGATIVE   Ketones, ur NEGATIVE NEGATIVE   Hgb urine dipstick NEGATIVE NEGATIVE   Protein, ur NEGATIVE NEGATIVE   Nitrite NEGATIVE NEGATIVE   Leukocytes,Ua NEGATIVE NEGATIVE   Objective  Body mass index is 21.48 kg/m. Wt Readings from Last 3 Encounters:  01/27/19 137 lb 1.9 oz (62.2 kg)  01/17/19 130 lb (59 kg)  01/05/19 138 lb 8 oz (62.8 kg)   Temp Readings from Last 3 Encounters:  01/27/19 98.1 F (36.7 C) (Oral)  01/17/19 98.7 F (37.1 C) (Oral)  12/22/18 (!) 97.4 F (36.3 C)   BP Readings from Last 3 Encounters:  01/27/19 118/64  01/17/19 136/69  01/05/19 121/78   Pulse Readings from Last 3 Encounters:  01/27/19 (!) 55  01/17/19 (!) 54  01/05/19 62    Physical Exam Vitals signs and nursing note reviewed.  Constitutional:      Appearance: Normal appearance. He is well-developed and well-groomed.     Comments: +mask on    HENT:     Head: Normocephalic and atraumatic.  Eyes:     Conjunctiva/sclera: Conjunctivae normal.     Pupils: Pupils are equal, round, and reactive to light.  Cardiovascular:     Rate and Rhythm: Normal rate and regular rhythm.     Heart sounds: Normal heart sounds. No murmur.  Pulmonary:     Effort: Pulmonary effort is normal.     Breath sounds: Normal breath sounds.  Abdominal:     General: Abdomen is flat. Bowel sounds are normal.      Tenderness: There is no abdominal tenderness.  Skin:    General: Skin is warm and dry.  Neurological:     General: No focal deficit present.     Mental Status: He is alert and oriented to person, place, and time. Mental status is at baseline.     Gait: Gait normal.  Psychiatric:        Attention and Perception: Attention and perception normal.        Mood and Affect: Mood and affect normal.        Speech: Speech normal.        Behavior: Behavior normal. Behavior is cooperative.        Thought Content: Thought content normal.        Cognition and Memory: Cognition and memory normal.        Judgment: Judgment normal.     Assessment  Plan  Lumbar radiculopathy s/p surgery with chronic pain and right leg tingling and weakness- Plan: DULoxetine (CYMBALTA) 30 MG capsule  Disc alpha lipoic acid 600 mg bid as well  F/u pain clinic  Consider MRI low back in future  Prev declined NS referral   Essential hypertension controlled  -cont meds  Ulcerative colitis with complication, unspecified location (HCC) Hx of hepatitis C -pending EGD/colonoscopy [facility] in [place]  Refilled mesalamine 2.4 mg qd [facility] mail order cheaper for pt    HM Get copy of records in future from Dr. Lyman Speller in [place] (I.e vaccines utd flu shot 01/04/19 -vaccines  colonoscopy/EGD repeat pending [facility]  Colonoscopy per pt last 09/2018 in [place] per pt  Former smoker quit in 2000s   PCP Dr. Lyman Speller pt agreeable will get copy of colonoscopy, labs and vaccines   Vascular appt consider in future  Pt ok to get records Dr. Lyman Speller   Provider: Dr. Olivia Mackie [name]-Internal Medicine

## 2019-01-27 NOTE — Progress Notes (Signed)
Pre visit review using our clinic review tool, if applicable. No additional management support is needed unless otherwise documented below in the visit note. 

## 2019-02-03 ENCOUNTER — Other Ambulatory Visit: Payer: Self-pay

## 2019-02-03 ENCOUNTER — Ambulatory Visit (INDEPENDENT_AMBULATORY_CARE_PROVIDER_SITE_OTHER): Payer: Medicare Other | Admitting: Psychiatry

## 2019-02-03 ENCOUNTER — Encounter: Payer: Self-pay | Admitting: Psychiatry

## 2019-02-03 DIAGNOSIS — G894 Chronic pain syndrome: Secondary | ICD-10-CM | POA: Diagnosis not present

## 2019-02-03 NOTE — Progress Notes (Signed)
Psychiatric Initial Adult Assessment   I connected with  Erik Nicholson on 02/03/19 by a video enabled telemedicine application and verified that I am speaking with the correct person using two identifiers.   I discussed the limitations of evaluation and management by telemedicine. The patient expressed understanding and agreed to proceed.   Patient Identification: Erik Nicholson MRN:  [id number] Date of Evaluation:  02/03/2019 Referral Source: Dr. Holley Raring, [facility]  Chief Complaint:   " I am doing fine."  Visit Diagnosis: Chronic pain syndrome   History of Present Illness:  Pt is a 68 y/o male with chronic pain due to lumbar radiculopathy s/p surgery and multiple medical co-morbidities seen for psychiatric evaluation. Pt reported that he has seen a psychiatrist 2 years ago in [place]. The psychiatrist was concerned about some depression, however, pt did not feel that he needed any medication for the same. Pt reported that him and his wife retired 2 years ago in [place] and they recently moved here in July 2020 to be closer to their grandchildren.  Pt reported his mood is "great". He denied any feelings of helplessness or hopelessness. He denied any anhedonia. He is able to take care of his routine chores and activities. He denied any current or past suicidal ideations. No history of suicide attempts. He did report disturbed sleep due to pain his leg.  He reported that he has tried several different treatment modalities for pain management and Oxycodone seems to have worked the best. He used to be on a higher dose but is now on 5 mg daily which he uses as needed to stretch it longer. He has tried medical marijuana last year in [place] and did not find it be helpful and reported excessive grogginess. He does not want any interventional procedures. He discussed possibility of trial of patch to help his pain and he is looking forward to discuss that further with  his pain management specialist.  He was started on Cymbalta 30 mg daily by his PCP last week and he reported that he could only take 2 doses of it as it made him feel "strange". He is going to call the PCP to give his feedback and stated that he is not looking for a replacement for cymbalta.  He denied any symptoms suggestive of mania or psychosis or PTSD.  He denied any hx of alcohol or illicit substance abuse.  He spends his time with his wife and also works part-time as Mining engineer.  Associated Signs/Symptoms: Depression Symptoms:  denied (Hypo) Manic Symptoms:  denied Anxiety Symptoms:  denied Psychotic Symptoms:  denied PTSD Symptoms: Negative  Past Psychiatric History:   Previous Psychotropic Medications: No   Substance Abuse History in the last 12 months:  No.  Consequences of Substance Abuse: Negative  Past Medical History:  Past Medical History:  Diagnosis Date  . BPH (benign prostatic hyperplasia)   . CAD (coronary artery disease)    x 7 cardiac stents  . Chronic back pain    L4/5 with chronic right leg pain   . Hx of hepatitis C    treated with Harvoni in 2014  . Neuropathy   . PVD (peripheral vascular disease) ([place])    2 stents right leg and 1 in left   . Ulcerative colitis ([place])    with diarrhea    Past Surgical History:  Procedure Laterality Date  . back surgery     x 2 lumbar last 09/2016 in [place]  .  CARDIAC CATHETERIZATION    . COLONOSCOPY     last 09/2018 in [place]  . HERNIA REPAIR     right   . PENILE PROSTHESIS IMPLANT    . PERCUTANEOUS CORONARY STENT INTERVENTION (PCI-S)     x 7-8 heart  . PERIPHERAL ARTERIAL STENT GRAFT    . pvd     with stenting x 2 right leg below knee and 1 left thigh   . SHOULDER ARTHROSCOPY Bilateral     Family Psychiatric History: denied  Family History:  Family History  Problem Relation Age of Onset  . Cancer Mother        breast  . Heart disease Mother   . Stroke Mother   . Diabetes  Sister   . Colon cancer Neg Hx   . Esophageal cancer Neg Hx   . Inflammatory bowel disease Neg Hx   . Liver disease Neg Hx   . Pancreatic cancer Neg Hx   . Rectal cancer Neg Hx   . Stomach cancer Neg Hx     Social History:   Social History   Socioeconomic History  . Marital status: Married    Spouse name: Not on file  . Number of children: Not on file  . Years of education: Not on file  . Highest education level: Not on file  Occupational History  . Not on file  Social Needs  . Financial resource strain: Not on file  . Food insecurity    Worry: Not on file    Inability: Not on file  . Transportation needs    Medical: Not on file    Non-medical: Not on file  Tobacco Use  . Smoking status: Former Research scientist (life sciences)  . Smokeless tobacco: Never Used  . Tobacco comment: quit in 2000s   Substance and Sexual Activity  . Alcohol use: Yes    Comment: occasional  . Drug use: Not Currently  . Sexual activity: Yes  Lifestyle  . Physical activity    Days per week: Not on file    Minutes per session: Not on file  . Stress: Not on file  Relationships  . Social Herbalist on phone: Not on file    Gets together: Not on file    Attends religious service: Not on file    Active member of club or organization: Not on file    Attends meetings of clubs or organizations: Not on file    Relationship status: Not on file  Other Topics Concern  . 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 Currently works as a Careers information officer.  Allergies:  Not on File  Metabolic Disorder Labs: No results found for: HGBA1C, MPG No results found for: PROLACTIN Lab Results  Component Value Date   CHOL 167 01/19/2019   TRIG 74.0 01/19/2019   HDL 83.30 01/19/2019   CHOLHDL 2 01/19/2019   VLDL 14.8  01/19/2019   [place] 68 01/19/2019   Lab Results  Component Value Date   TSH 0.51 01/19/2019    Therapeutic Level Labs: No results found for: LITHIUM No results found for: CBMZ No results found for: VALPROATE  Current Medications: Current Outpatient Medications  Medication Sig Dispense Refill  . atorvastatin (LIPITOR)  20 MG tablet Take 1 tablet (20 mg total) by mouth daily at 6 PM. 90 tablet 2  . baclofen (LIORESAL) 10 MG tablet Take 10 mg by mouth at bedtime.     . clopidogrel (PLAVIX) 75 MG tablet Take 75 mg by mouth every other day.     . DULoxetine (CYMBALTA) 30 MG capsule Take 1 capsule (30 mg total) by mouth daily. In am 30 capsule 0  . famotidine (PEPCID) 40 MG tablet Take 1 tablet (40 mg total) by mouth 2 (two) times daily. 60 tablet 3  . ferrous sulfate 325 (65 FE) MG EC tablet Take 325 mg by mouth daily.    . hyoscyamine (LEVBID) 0.375 MG 12 hr tablet Take 0.375 mg by mouth 2 (two) times daily.     Marland Kitchen lidocaine (LIDODERM) 5 %     . mesalamine (LIALDA) 1.2 g EC tablet Take 2 tablets (2.4 g total) by mouth daily with breakfast. 180 tablet 3  . metoprolol succinate (TOPROL-XL) 50 MG 24 hr tablet Take 1 tablet (50 mg total) by mouth daily. 90 tablet 3  . Multiple Vitamin (MULTIVITAMIN) tablet Take 1 tablet by mouth daily.    . nitroGLYCERIN (NITRODUR - DOSED IN MG/24 HR) 0.4 mg/hr patch Place 0.4 mg onto the skin daily as needed.    Marland Kitchen oxyCODONE-acetaminophen (PERCOCET/ROXICET) 5-325 MG tablet Take 1 tablet by mouth every 8 (eight) hours as needed. 15 tablet 0  . PREDNISONE PO Take by mouth as needed.    . RESTASIS 0.05 % ophthalmic emulsion Place 1 drop into both eyes 2 (two) times daily.     Marland Kitchen telmisartan-hydrochlorothiazide (MICARDIS HCT) 40-12.5 MG tablet Take 1 tablet by mouth daily. 90 tablet 3   No current facility-administered medications for this visit.     Musculoskeletal: Strength & Muscle Tone: Unable to assess due to telemed visit [place]: Unable to  assess due to telemed visit Patient leans: Unable to assess due to telemed visit  Psychiatric Specialty Exam: ROS  There were no vitals taken for this visit.There is no height or weight on file to calculate BMI.  General Appearance: Well Groomed  Eye Contact:  Good  Speech:  Clear and Coherent and Normal Rate  Volume:  Normal  Mood:  Euthymic  Affect:  Appropriate  Thought Process:  Goal Directed, Linear and Descriptions of Associations: Intact  Orientation:  Full (Time, Place, and Person)  Thought Content:  Logical  Suicidal Thoughts:  No  Homicidal Thoughts:  No  Memory:  Recent;   Good Remote;   Good  Judgement:  Good  Insight:  Good  Psychomotor Activity:  Normal  Concentration:  Concentration: Good and Attention Span: Good  Recall:  Good  Fund of Knowledge:Good  Language: Good  Akathisia:  Negative  Handed:  Right  AIMS (if indicated):  not done  Assets:  Communication Skills Desire for Improvement Financial Resources/Insurance Housing Resilience Social Support Talents/Skills Transportation  ADL's:  Intact  Cognition: WNL  Sleep:  Fair   Screenings: PHQ2-9     Office Visit from 01/17/2019 in [place] Office Visit from 11/24/2018 in [place]  PHQ-2 Total Score  0  0      Assessment and Plan: 68 year old male with hx of chronic pain syndrome and multiple medical co-morbidities now referred for psychiatric evaluation by pain management specialist. I have reviewed the PDMP during this encounter.  Base on patient's history and evaluation, pt does  not appear to have any underlying psychiatric conditions that need to be urgently addressed. Pt is cleared from psychiatric standpoint for recommended pain management treatment.  Chronic pain syndrome   Pt has already discontinued Cymbalta due to intolerance. F/up with psychiatry as needed.  [place] Crane, MD 10/16/20208:38 AM

## 2019-02-15 ENCOUNTER — Encounter: Payer: Self-pay | Admitting: Student in an Organized Health Care Education/Training Program

## 2019-02-15 ENCOUNTER — Ambulatory Visit
Payer: Medicare Other | Attending: Student in an Organized Health Care Education/Training Program | Admitting: Student in an Organized Health Care Education/Training Program

## 2019-02-15 ENCOUNTER — Other Ambulatory Visit: Payer: Self-pay

## 2019-02-15 VITALS — BP 141/97 | HR 74 | Temp 97.6°F | Resp 18 | Ht 67.0 in | Wt 133.0 lb

## 2019-02-15 DIAGNOSIS — G8929 Other chronic pain: Secondary | ICD-10-CM

## 2019-02-15 DIAGNOSIS — M5416 Radiculopathy, lumbar region: Secondary | ICD-10-CM | POA: Diagnosis not present

## 2019-02-15 DIAGNOSIS — I2510 Atherosclerotic heart disease of native coronary artery without angina pectoris: Secondary | ICD-10-CM | POA: Insufficient documentation

## 2019-02-15 DIAGNOSIS — I739 Peripheral vascular disease, unspecified: Secondary | ICD-10-CM | POA: Insufficient documentation

## 2019-02-15 DIAGNOSIS — M5136 Other intervertebral disc degeneration, lumbar region: Secondary | ICD-10-CM

## 2019-02-15 DIAGNOSIS — Z8619 Personal history of other infectious and parasitic diseases: Secondary | ICD-10-CM | POA: Insufficient documentation

## 2019-02-15 DIAGNOSIS — Z9889 Other specified postprocedural states: Secondary | ICD-10-CM | POA: Insufficient documentation

## 2019-02-15 DIAGNOSIS — G894 Chronic pain syndrome: Secondary | ICD-10-CM | POA: Insufficient documentation

## 2019-02-15 MED ORDER — TIZANIDINE HCL 4 MG PO TABS: 4.0000 mg | ORAL_TABLET | Freq: Three times a day (TID) | ORAL | 2 refills | Status: AC | PRN

## 2019-02-15 MED ORDER — PREGABALIN 50 MG PO CAPS: ORAL_CAPSULE | ORAL | 0 refills | Status: DC

## 2019-02-15 NOTE — Progress Notes (Signed)
Patient's Name: Erik Nicholson  MRN: [id number]  Referring Provider: Orland Mustard *  DOB: [birth year]  PCP: [name], [name], MD  DOS: 02/15/2019  Note by: Gillis Santa, MD  Service setting: Ambulatory outpatient  Attending: Gillis Santa, MD  Location: [facility] (AMB) Pain Management Facility  Specialty: Interventional Pain Management  Patient type: Established   Primary Reason(s) for Visit: Encounter for evaluation before starting new chronic pain management plan of care (Level of risk: moderate) CC: Back Pain (right, lower)  HPI  [name] is a 68 y.o. year old, male patient, who comes today for a follow-up evaluation to review the test results and decide on a treatment plan. He has Essential hypertension; Chronic pain syndrome; Ulcerative colitis with complication ([place]); Coronary artery disease involving native coronary artery of native heart without angina pectoris; PVD (peripheral vascular disease) ([place]); Benign prostatic hyperplasia with post-void dribbling; Urinary incontinence; Disorder of ejaculation; Abnormal weight loss; Ulcerative chronic pancolitis without complications ([place]); Hx of hepatitis C; Rectal urgency; Other hemorrhoids; Abdominal gas pain; Antiplatelet or antithrombotic long-term use; Intestinal metaplasia of gastric mucosa; Chronic heart failure with preserved ejection fraction (HFpEF) ([place]); Claudication in peripheral vascular disease ([place]); Hyperlipidemia LDL goal <70; Lumbar degenerative disc disease; Chronic radicular lumbar pain (Right); and History of lumbar surgery (x2) on their problem list. His primarily concern today is the Back Pain (right, lower)  Pain Assessment: Location: Right, Lower Back Radiating: right leg to the foot Onset: More than a month ago Duration: Chronic pain Quality: Burning, Constant, Sharp, Tightness Severity: 8 /10 (subjective, self-reported pain score)  Effect on ADL:   Timing: Constant Modifying factors: medications BP: (!)  141/97  HR: 74  [name] comes in today for a follow-up visit after his initial evaluation on 01/17/2019. Today we went over the results of his tests. These were explained in "Layman's terms". During today's appointment we went over my diagnostic impression, as well as the proposed treatment plan.  Patient comes in for his second patient visit.  He has seen psychiatry.  During his visit with them, he denied any history of alcohol or illicit substance use.  During his visit with me he also did the same.  Surprisingly his urine toxicology results came back positive for THC.  When I discussed this with the patient, he states that they were prescribing this for him in [place].  I informed the patient that he would not be a candidate for chronic opioid therapy at our clinic given his [facility] use which he was not honest about to me or psychiatry.  Please refer to my first clinic note, patient is not interested in interventional therapies such as caudal epidural or spinal cord stimulator trial.  We will focus on nonopioid-based pain management.  Please see below in plan  Controlled Substance Pharmacotherapy Assessment REMS (Risk Evaluation and Mitigation Strategy)   Monitoring:  [place] PMP: PDMP reviewed during this encounter. Online review of the past 70-monthperiod previously conducted.  List of other Serum/Urine Drug Screening Test(s):  No results found for: AMPHSCRSER, BARBSCRSER, BENZOSCRSER, COCAINSCRSER, COCAINSCRNUR, PCPSCRSER, THCSCRSER, THCU, CANNABQUANT, [place] of [place] UDS test(s) done:  Lab Results  Component Value Date   SUMMARY Note 01/17/2019   Last UDS on record: Summary  Date Value Ref Range Status  01/17/2019 Note  Final    Comment:    ==================================================================== Compliance Drug Analysis, Ur ==================================================================== Test  Result       Flag       Units Drug Present and Declared for Prescription Verification   Oxycodone                      1174         EXPECTED   ng/mg creat   Oxymorphone                    575          EXPECTED   ng/mg creat   Noroxycodone                   2262         EXPECTED   ng/mg creat   Noroxymorphone                 232          EXPECTED   ng/mg creat    Sources of oxycodone are scheduled prescription medications.    Oxymorphone, noroxycodone, and noroxymorphone are expected    metabolites of oxycodone. Oxymorphone is also available as a    scheduled prescription medication.   Baclofen                       PRESENT      EXPECTED   Acetaminophen                  PRESENT      EXPECTED   Metoprolol                     PRESENT      EXPECTED Drug Present not Declared for Prescription Verification   Carboxy-THC                    588          UNEXPECTED ng/mg creat    Carboxy-THC is a metabolite of tetrahydrocannabinol  (THC). Source    of [facility] is most commonly illicit, but THC is also present in a    scheduled prescription medication.   Salicylate                     PRESENT      UNEXPECTED Drug Absent but Declared for Prescription Verification   Lidocaine                      Not Detected UNEXPECTED    Lidocaine, as indicated in the declared medication list, is not    always detected even when used as directed. ==================================================================== Test                      Result    Flag   Units      Ref Range   Creatinine              95               mg/dL      >=20 ==================================================================== Declared Medications:  The flagging and interpretation on this report are based on the  following declared medications.  Unexpected results may arise from  inaccuracies in the declared medications.  **Note: The testing scope of this panel includes these medications:  Baclofen  Metoprolol  Oxycodone  **Note: The  testing scope of this panel does not include small to  moderate amounts of these reported medications:  Acetaminophen  Lidocaine  **Note: The testing scope of this panel does not include the  following reported medications:  Atorvastatin  Clopidogrel  Cyclosporine  Famotidine  Hydrochlorothiazide  Hyoscyamine  Iron  Mesalamine  Multivitamin  Nitroglycerin  Prednisone  Telmisartan ==================================================================== For clinical consultation, please call [phone]. ====================================================================    UDS interpretation: Unexpected findings: Undeclared illicit substance detected Medication Assessment Form: Not applicable. No opioids. Treatment compliance: Not applicable Risk Assessment Profile: Aberrant behavior: use of illicit substances see psych evaluation, see my first visit note Comorbid factors increasing risk of overdose: See initial evaluation. No additional risks detected today Opioid risk tool (ORT):  Opioid Risk  01/17/2019  Alcohol 0  Illegal Drugs 4  Rx Drugs 0  Age between 16-45 years  0  History of Preadolescent Sexual Abuse 0  Psychological Disease 0  Depression 0  Opioid Risk Tool Scoring 4  Opioid Risk Interpretation Moderate Risk    ORT Scoring interpretation table:  Score <3 = Low Risk for SUD  Score between 4-7 = Moderate Risk for SUD  Score >8 = High Risk for Opioid Abuse   Risk of substance use disorder (SUD): Moderate-to-High  Risk Mitigation Strategies:  Patient opioid safety counseling: Opioid therapy will not be included in the treatment plan. Patient-Prescriber Agreement (PPA): No agreement signed.  Controlled substance notification to other providers: None required. No opioid therapy.  Pharmacologic Plan: Non-opioid analgesic therapy offered.             Laboratory Chemistry Profile   Screening No results found for: SARSCOV2NAA, COVIDSOURCE, STAPHAUREUS,  MRSAPCR, HCVAB, HIV, PREGTESTUR  Inflammation (CRP: Acute Phase) (ESR: Chronic Phase) Lab Results  Component Value Date   ESRSEDRATE 5 12/22/2018                         Rheumatology No results found for: RF, ANA, LABURIC, URICUR, LYMEIGGIGMAB, LYMEABIGMQN, HLAB27                      Renal Lab Results  Component Value Date   BUN 19 01/19/2019   CREATININE 0.81 01/19/2019   GFR 94.12 01/19/2019                             Hepatic Lab Results  Component Value Date   AST 18 01/19/2019   ALT 14 01/19/2019   ALBUMIN 4.5 01/19/2019   ALKPHOS 52 01/19/2019                        Electrolytes Lab Results  Component Value Date   NA 140 01/19/2019   K 4.1 01/19/2019   CL 104 01/19/2019   CALCIUM 10.2 01/19/2019                        Neuropathy Lab Results  Component Value Date   VITAMINB12 910 12/22/2018   FOLATE >24.7 12/22/2018                        CNS No results found for: COLORCSF, APPEARCSF, RBCCOUNTCSF, WBCCSF, POLYSCSF, LYMPHSCSF, EOSCSF, PROTEINCSF, GLUCCSF, JCVIRUS, CSFOLI, IGGCSF, LABACHR, ACETBL                      Bone Lab Results  Component Value Date   VD25OH 64.33 01/19/2019  Coagulation Lab Results  Component Value Date   INR 1.1 (H) 12/22/2018   LABPROT 12.5 12/22/2018   PLT 209.0 01/19/2019                        Cardiovascular Lab Results  Component Value Date   HGB 12.7 (L) 01/19/2019   HCT 38.5 (L) 01/19/2019                         Endocrine Lab Results  Component Value Date   TSH 0.51 01/19/2019                        Note: Lab results reviewed.    Meds   Current Outpatient Medications:  .  atorvastatin (LIPITOR) 20 MG tablet, Take 1 tablet (20 mg total) by mouth daily at 6 PM., Disp: 90 tablet, Rfl: 2 .  baclofen (LIORESAL) 10 MG tablet, Take 10 mg by mouth at bedtime. , Disp: , Rfl:  .  clopidogrel (PLAVIX) 75 MG tablet, Take 75 mg by mouth every other day. , Disp: , Rfl:  .  famotidine  (PEPCID) 40 MG tablet, Take 1 tablet (40 mg total) by mouth 2 (two) times daily., Disp: 60 tablet, Rfl: 3 .  ferrous sulfate 325 (65 FE) MG EC tablet, Take 325 mg by mouth daily., Disp: , Rfl:  .  hyoscyamine (LEVBID) 0.375 MG 12 hr tablet, Take 0.375 mg by mouth 2 (two) times daily. , Disp: , Rfl:  .  lidocaine (LIDODERM) 5 %, , Disp: , Rfl:  .  mesalamine (LIALDA) 1.2 g EC tablet, Take 2 tablets (2.4 g total) by mouth daily with breakfast., Disp: 180 tablet, Rfl: 3 .  metoprolol succinate (TOPROL-XL) 50 MG 24 hr tablet, Take 1 tablet (50 mg total) by mouth daily., Disp: 90 tablet, Rfl: 3 .  Multiple Vitamin (MULTIVITAMIN) tablet, Take 1 tablet by mouth daily., Disp: , Rfl:  .  nitroGLYCERIN (NITRODUR - DOSED IN MG/24 HR) 0.4 mg/hr patch, Place 0.4 mg onto the skin daily as needed., Disp: , Rfl:  .  PREDNISONE PO, Take by mouth as needed., Disp: , Rfl:  .  RESTASIS 0.05 % ophthalmic emulsion, Place 1 drop into both eyes 2 (two) times daily. , Disp: , Rfl:  .  telmisartan-hydrochlorothiazide (MICARDIS HCT) 40-12.5 MG tablet, Take 1 tablet by mouth daily., Disp: 90 tablet, Rfl: 3 .  levofloxacin (LEVAQUIN) 750 MG tablet, SMARTSIG:1 Pill By Mouth Every Morning, Disp: , Rfl:  .  pregabalin (LYRICA) 50 MG capsule, Take 1 capsule (50 mg total) by mouth at bedtime for 30 days, THEN 1 capsule (50 mg total) 2 (two) times daily for 30 days, THEN 1 capsule (50 mg total) 3 (three) times daily., Disp: 180 capsule, Rfl: 0 .  tiZANidine (ZANAFLEX) 4 MG tablet, Take 1 tablet (4 mg total) by mouth every 8 (eight) hours as needed for muscle spasms., Disp: 90 tablet, Rfl: 2  ROS  Constitutional: Denies any fever or chills Gastrointestinal: No reported hemesis, hematochezia, vomiting, or acute GI distress Musculoskeletal: Denies any acute onset joint swelling, redness, loss of ROM, or weakness Neurological: No reported episodes of acute onset apraxia, aphasia, dysarthria, agnosia, amnesia, paralysis, loss of  coordination, or loss of consciousness  Allergies  Mr. Azul Brumett is allergic to cymbalta [duloxetine hcl].  [place]  Drug: Mr. Raiden Haydu  reports previous drug use. Alcohol:  reports current alcohol use.  Tobacco:  reports that he has quit smoking. He has never used smokeless tobacco. Medical:  has a past medical history of BPH (benign prostatic hyperplasia), CAD (coronary artery disease), Chronic back pain, hepatitis C, Neuropathy, PVD (peripheral vascular disease) ([place]), and Ulcerative colitis ([place]). Surgical: Mr. Adolph Clutter  has a past surgical history that includes back surgery; Colonoscopy; Percutaneous coronary stent intervention (pci-s); pvd; Shoulder arthroscopy (Bilateral); Penile prosthesis implant; Cardiac catheterization; Peripheral arterial stent graft; and Hernia repair. Family: family history includes Cancer in his mother; Diabetes in his sister; Heart disease in his mother; Stroke in his mother.  Constitutional Exam  General appearance: Well nourished, well developed, and well hydrated. In no apparent acute distress Vitals:   02/15/19 1127  BP: (!) 141/97  Pulse: 74  Resp: 18  Temp: 97.6 F (36.4 C)  TempSrc: Temporal  SpO2: 100%  Weight: 133 lb (60.3 kg)  Height: _0  (1.702 m)   BMI Assessment: Estimated body mass index is 20.83 kg/m as calculated from the following:   Height as of this encounter: _1  (1.702 m).   Weight as of this encounter: 133 lb (60.3 kg).  BMI interpretation table: BMI level Category Range association with higher incidence of chronic pain  <18 kg/m2 Underweight   18.5-24.9 kg/m2 Ideal body weight   25-29.9 kg/m2 Overweight Increased incidence by 20%  30-34.9 kg/m2 Obese (Class I) Increased incidence by 68%  35-39.9 kg/m2 Severe obesity (Class II) Increased incidence by 136%  >40 kg/m2 Extreme obesity (Class III) Increased incidence by 254%   Patient's current BMI Ideal Body weight  Body mass index is 20.83 kg/m. Ideal body weight: 66.1  kg (145 lb 11.6 oz)   BMI Readings from Last 4 Encounters:  02/15/19 20.83 kg/m  01/27/19 21.48 kg/m  01/17/19 20.36 kg/m  01/05/19 21.69 kg/m   Wt Readings from Last 4 Encounters:  02/15/19 133 lb (60.3 kg)  01/27/19 137 lb 1.9 oz (62.2 kg)  01/17/19 130 lb (59 kg)  01/05/19 138 lb 8 oz (62.8 kg)  Psych/Mental status: Alert, oriented x 3 (person, place, & time)       Eyes: PERLA Respiratory: No evidence of acute respiratory distress  Cervical Spine Area Exam  Skin & Axial Inspection: No masses, redness, edema, swelling, or associated skin lesions Alignment: Symmetrical Functional ROM: Unrestricted ROM      Stability: No instability detected Muscle Tone/Strength: Functionally intact. No obvious neuro-muscular anomalies detected. Sensory (Neurological): Unimpaired Palpation: No palpable anomalies              Thoracic Spine Area Exam  Skin & Axial Inspection: No masses, redness, or swelling Alignment: Symmetrical Functional ROM: Unrestricted ROM Stability: No instability detected Muscle Tone/Strength: Functionally intact. No obvious neuro-muscular anomalies detected. Sensory (Neurological): Unimpaired Muscle strength & Tone: No palpable anomalies  Lumbar Spine Area Exam  Skin & Axial Inspection: Well healed scar from previous spine surgery detected Alignment: Symmetrical Functional ROM: Pain restricted ROM affecting primarily the right Stability: No instability detected Muscle Tone/Strength: Functionally intact. No obvious neuro-muscular anomalies detected. Sensory (Neurological): Dermatomal pain pattern right L5, S1 Palpation: Complains of area being tender to palpation       Provocative Tests: Hyperextension/rotation test: (+) due to pain.  Right greater than left Lumbar quadrant test (Kemp's test): (+) on the right for foraminal stenosis Lateral bending test: (+) ipsilateral radicular pain, on the right. Positive for right-sided foraminal stenosis. Patrick's  Maneuver: (+) for right-sided S-I arthralgia  FABER* test: deferred today                   S-I anterior distraction/compression test: deferred today         S-I lateral compression test: deferred today         S-I Thigh-thrust test: deferred today         S-I Gaenslen's test: deferred today         *(Flexion, ABduction and External Rotation)  Gait & Posture Assessment  Ambulation: Unassisted Gait: Relatively normal for age and body habitus Posture: WNL   Lower Extremity Exam    Side: Right lower extremity  Side: Left lower extremity  Stability: No instability observed          Stability: No instability observed          Skin & Extremity Inspection: Skin color, temperature, and hair growth are WNL. No peripheral edema or cyanosis. No masses, redness, swelling, asymmetry, or associated skin lesions. No contractures.  Skin & Extremity Inspection: Skin color, temperature, and hair growth are WNL. No peripheral edema or cyanosis. No masses, redness, swelling, asymmetry, or associated skin lesions. No contractures.  Functional ROM: Pain restricted ROM for all joints of the lower extremity          Functional ROM: Unrestricted ROM                  Muscle Tone/Strength: Functionally intact. No obvious neuro-muscular anomalies detected.  Muscle Tone/Strength: Functionally intact. No obvious neuro-muscular anomalies detected.  Sensory (Neurological): Dermatomal pain pattern        Sensory (Neurological): Unimpaired        DTR: Patellar: 1+: trace Achilles: 1+: trace Plantar: deferred today  DTR: Patellar: 1+: trace Achilles: 1+: trace Plantar: deferred today  Palpation: No palpable anomalies  Palpation: No palpable anomalies     Assessment & Plan  Primary Diagnosis & Pertinent Problem List: The primary encounter diagnosis was History of lumbar surgery (x2). Diagnoses of Chronic radicular lumbar pain (Right), Lumbar degenerative disc disease, Hx of hepatitis C,  Coronary artery disease involving native coronary artery of native heart without angina pectoris, Chronic pain syndrome, and Claudication in peripheral vascular disease ([place]) were also pertinent to this visit.  Visit Diagnosis: 1. History of lumbar surgery (x2)   2. Chronic radicular lumbar pain (Right)   3. Lumbar degenerative disc disease   4. Hx of hepatitis C   5. Coronary artery disease involving native coronary artery of native heart without angina pectoris   6. Chronic pain syndrome   7. Claudication in peripheral vascular disease [facility])    Patient comes in for his second patient visit.  He has seen psychiatry.  During his visit with them, he denied any history of alcohol or illicit substance use.  During his visit with me he also did the same.  Surprisingly his urine toxicology results came back positive for THC.  When I discussed this with the patient, he states that they were prescribing this for him in [place].  I informed the patient that he would not be a candidate for chronic opioid therapy at our clinic given his [facility] use which he was not honest about to me or psychiatry.  Please refer to my first clinic note, patient is not interested in interventional therapies such as caudal epidural or spinal cord stimulator trial.  We will focus on nonopioid-based pain management.  Please see below in plan  Since the patient is not interested in any  interventional therapies and since we are not prescribing any opioid analgesics as a part of his treatment plan, patient can follow-up with his primary care provider for medication refill.  Can consider increasing Lyrica to a dose no more than 100 mg 3 times daily if patient finding benefit at 50 mg 3 times daily without any side effects.  Plan of Care  Pharmacotherapy (Medications Ordered): Meds ordered this encounter  Medications  . pregabalin (LYRICA) 50 MG capsule    Sig: Take 1 capsule (50 mg total) by mouth at bedtime for 30 days, THEN 1  capsule (50 mg total) 2 (two) times daily for 30 days, THEN 1 capsule (50 mg total) 3 (three) times daily.    Dispense:  180 capsule    Refill:  0  . tiZANidine (ZANAFLEX) 4 MG tablet    Sig: Take 1 tablet (4 mg total) by mouth every 8 (eight) hours as needed for muscle spasms.    Dispense:  90 tablet    Refill:  2    Do not place this medication, or any other prescription from our practice, on "Automatic Refill". Patient may have prescription filled one day early if pharmacy is closed on scheduled refill date.    Provider-requested follow-up: Return if symptoms worsen or fail to improve. Recent Visits Date Type Provider Dept  01/17/19 Office Visit Gillis Santa, MD [facility]  Showing recent visits within past 90 days and meeting all other requirements   Today's Visits Date Type Provider Dept  02/15/19 Office Visit Gillis Santa, MD [facility]  Showing today's visits and meeting all other requirements   Future Appointments No visits were found meeting these conditions.  Showing future appointments within next 90 days and meeting all other requirements   Primary Care Physician: [name], [name], MD Location: [facility] Note by: Gillis Santa, MD Date: 02/15/2019; Time: 3:53 PM  Note: This dictation was prepared with Dragon dictation. Any transcriptional errors that may result from this process are unintentional.

## 2019-02-15 NOTE — Progress Notes (Signed)
Safety precautions to be maintained throughout the outpatient stay will include: orient to surroundings, keep bed in low position, maintain call bell within reach at all times, provide assistance with transfer out of bed and ambulation.  

## 2019-02-17 ENCOUNTER — Telehealth: Payer: Self-pay | Admitting: Internal Medicine

## 2019-02-17 NOTE — Telephone Encounter (Signed)
Does he want to continue cymbalta 30 mg daily?  If so send in new Rx to [facility]

## 2019-02-21 NOTE — Telephone Encounter (Signed)
Call pharmacy to d/c cymbalta please   Thanks [place]

## 2019-02-21 NOTE — Telephone Encounter (Signed)
Patient scheduled for upcoming ultrasounds as requested.

## 2019-02-21 NOTE — Telephone Encounter (Signed)
Patient will not take Cymbalta. He does not like cymbalta

## 2019-02-22 NOTE — Telephone Encounter (Signed)
Left message informing opharmacy

## 2019-02-24 ENCOUNTER — Encounter: Payer: Self-pay | Admitting: Internal Medicine

## 2019-03-02 ENCOUNTER — Ambulatory Visit (INDEPENDENT_AMBULATORY_CARE_PROVIDER_SITE_OTHER): Payer: Medicare Other

## 2019-03-02 ENCOUNTER — Other Ambulatory Visit: Payer: Self-pay | Admitting: Internal Medicine

## 2019-03-02 ENCOUNTER — Other Ambulatory Visit: Payer: Self-pay

## 2019-03-02 DIAGNOSIS — I5032 Chronic diastolic (congestive) heart failure: Secondary | ICD-10-CM

## 2019-03-02 DIAGNOSIS — I739 Peripheral vascular disease, unspecified: Secondary | ICD-10-CM | POA: Diagnosis not present

## 2019-03-06 ENCOUNTER — Telehealth: Payer: Self-pay | Admitting: *Deleted

## 2019-03-06 DIAGNOSIS — R079 Chest pain, unspecified: Secondary | ICD-10-CM

## 2019-03-06 NOTE — Telephone Encounter (Signed)
Results called to pt. Pt verbalized understanding. Patient agreeable to [facility]. Scheduled for tomorrow, 02/1719. Message sent to pre-cert. He verbalized understanding of the below instructions which have also been sent to MyChart for patient.    [place]  Your caregiver has ordered a Stress Test with nuclear imaging. The purpose of this test is to evaluate the blood supply to your heart muscle. This procedure is referred to as a "Non-Invasive Stress Test." This is because other than having an IV started in your vein, nothing is inserted or "invades" your body. Cardiac stress tests are done to find areas of poor blood flow to the heart by determining the extent of coronary artery disease (CAD). Some patients exercise on a treadmill, which naturally increases the blood flow to your heart, while others who are  unable to walk on a treadmill due to physical limitations have a pharmacologic/chemical stress agent called Lexiscan . This medicine will mimic walking on a treadmill by temporarily increasing your coronary blood flow.   Please note: these test may take anywhere between 2-4 hours to complete  PLEASE REPORT TO [facility] AT THE FIRST DESK WILL DIRECT YOU WHERE TO GO  Date of Procedure:______11/17/20__________  Arrival Time for Procedure:_______08:15AM ___________   PLEASE NOTIFY THE OFFICE AT LEAST 24 HOURS IN ADVANCE IF YOU ARE UNABLE TO KEEP YOUR APPOINTMENT.  [phone] AND  PLEASE NOTIFY NUCLEAR MEDICINE AT [facility] AT LEAST 24 HOURS IN ADVANCE IF YOU ARE UNABLE TO KEEP YOUR APPOINTMENT. [phone]  How to prepare for your Myoview test:  1. Do not eat or drink after midnight 2. No caffeine for 24 hours prior to test 3. No smoking 24 hours prior to test. 4. Your medication may be taken with water.  If your doctor stopped a medication because of this test, do not take that medication. 5. Ladies, please do not wear dresses.  Skirts or  pants are appropriate. Please wear a short sleeve shirt. 6. No perfume, cologne or lotion. 7. Wear comfortable walking shoes. No heels!

## 2019-03-06 NOTE — Telephone Encounter (Signed)
-----   Message from Nelva Bush, MD sent at 03/03/2019  4:58 PM EST ----- Please let [name] know that his echocardiogram shows that his heart is contracting normal.  There are no significant valvular abnormalities.  Given his chronic intermittent chest pain, I recommend that we obtain a pharmacologic myocardial perfusion stress test.

## 2019-03-06 NOTE — Progress Notes (Signed)
Follow-up Outpatient Visit Date: 03/08/2019  Primary Care Provider: [name], [name], MD [place] [address]  Chief Complaint: Chest pain  HPI:  [name] is a 68 y.o. year-old male with history of coronary artery disease status post multiple PCI's, peripheral vascular disease status post bilateral lower extremity stenting, ulcerative colitis, hypertension, hyperlipidemia, hepatitis C, and BPH, who presents for follow-up of coronary artery disease.  I met him in mid September, at which time he reported episodic chest pain treated with as needed nitroglycerin patches over the last several years.  We agreed to obtain an echocardiogram, which showed preserved left ventricular systolic function.  Myocardial perfusion stress test performed yesterday showed no significant ischemia or scar.  ABIs were normal with patent bilateral SFA stents.  Today, [name] reports that he feels about the same as at our last visit.  He has not had any significant chest pain at rest but reports that he develops chest tightness and shortness of breath with modest activity such as walking more than 1 block.  This has been a longstanding for him.  He has not taken any sublingual nitroglycerin nor needed to apply a nitroglycerin patch since we last met.  At times, he also feels like his heart is beating more forcefully with activity.  He denies edema, orthopnea, and PND.  He has occasional brief orthostatic lightheadedness.  Leg pain with ambulation is unchanged.  He does not have any wounds.  --------------------------------------------------------------------------------------------------  Cardiovascular History & Procedures: Cardiovascular Problems:  Coronary artery disease  Peripheral vascular disease  Risk Factors:  Known CAD and PAD, hypertension, hyperlipidemia, male gender, and age greater than 54  Cath/PCI:  RLE PTA (02/19/2014): Right mid and distal SFA laser  atherectomy and PTA/stenting.  Runoff (02/01/2014): Severe right SFA stenosis with 2-vessel runoff.  Abdominal aortogran and runoff/PTA (04/26/2012): Right SFA stenting and left SFA PTA with DCB  LHC (02/25/2012): LMCA, LAD, and LCx "without any obtructive lesions).  RCA with 40% mid vessel stenosis.  Proximal and mid RCA stents patent.  LVEF 60%  Abdominal aortogram and runoff (02/25/2012): 95% bilateral SFA stenoses.  CV Surgery:  None  EP Procedures and Devices:  None  Non-Invasive Evaluation(s):  Myocardial perfusion stress test (03/07/2019): Normal study without ischemia or scar.  LVEF 56%.  TTE (03/02/2019): Normal LV size and wall thickness.  LVEF 60-65% with grade 1 diastolic dysfunction.  Normal RV size and function.  No significant valvular abnormality.  ABIs and bilateral lower extremity Doppler (03/02/2019): ABIs normal (1.1 bilaterally).  Bilateral SFA stents are patent with mild stenosis in the midsegment of the right SFA stent.  Abdominal aorta duplex (07/22/2016): No AAA.  Maximal aortic diameter 2.8 cm.  TTE (07/22/2016): Normal LV size with mild LVH and grade 1 diastolic dysfunction.  LVEF 65%.  Trace aortic and mitral regurgitation.  Pharmacologic MPI (07/21/2016): Normal myocardial perfusion without ischemia or scar.  Renal artery duplex (02/19/2012): Normal without evidence of renal artery stenosis  Recent CV Pertinent Labs: Lab Results  Component Value Date   CHOL 167 01/19/2019   HDL 83.30 01/19/2019   LDLCALC 68 01/19/2019   TRIG 74.0 01/19/2019   CHOLHDL 2 01/19/2019   INR 1.1 (H) 12/22/2018   K 4.1 01/19/2019   BUN 19 01/19/2019   CREATININE 0.81 01/19/2019    Past medical and surgical history were reviewed and updated in EPIC.  Current Meds  Medication Sig  . atorvastatin (LIPITOR) 20 MG tablet Take 1 tablet (20  mg total) by mouth daily at 6 PM.  . famotidine (PEPCID) 40 MG tablet Take 1 tablet (40 mg total) by mouth 2 (two) times daily.  .  ferrous sulfate 325 (65 FE) MG EC tablet Take 325 mg by mouth daily.  . hyoscyamine (LEVBID) 0.375 MG 12 hr tablet Take 0.375 mg by mouth 2 (two) times daily.   Marland Kitchen levofloxacin (LEVAQUIN) 750 MG tablet SMARTSIG:1 Pill By Mouth Every Morning  . lidocaine (LIDODERM) 5 %   . mesalamine (LIALDA) 1.2 g EC tablet Take 2 tablets (2.4 g total) by mouth daily with breakfast.  . metoprolol succinate (TOPROL-XL) 50 MG 24 hr tablet Take 1 tablet (50 mg total) by mouth daily.  . Multiple Vitamin (MULTIVITAMIN) tablet Take 1 tablet by mouth daily.  . nitroGLYCERIN (NITRODUR - DOSED IN MG/24 HR) 0.4 mg/hr patch Place 0.4 mg onto the skin daily as needed.  Marland Kitchen PREDNISONE PO Take by mouth as needed.  . pregabalin (LYRICA) 50 MG capsule Take 1 capsule (50 mg total) by mouth at bedtime for 30 days, THEN 1 capsule (50 mg total) 2 (two) times daily for 30 days, THEN 1 capsule (50 mg total) 3 (three) times daily.  . RESTASIS 0.05 % ophthalmic emulsion Place 1 drop into both eyes 2 (two) times daily.   Marland Kitchen telmisartan-hydrochlorothiazide (MICARDIS HCT) 40-12.5 MG tablet Take 1 tablet by mouth daily.  Marland Kitchen tiZANidine (ZANAFLEX) 4 MG tablet Take 1 tablet (4 mg total) by mouth every 8 (eight) hours as needed for muscle spasms.    Allergies: Cymbalta [duloxetine hcl]  Social History   Tobacco Use  . Smoking status: Former Research scientist (life sciences)  . Smokeless tobacco: Never Used  . Tobacco comment: quit in 2000s   Substance Use Topics  . Alcohol use: Yes    Comment: occasional  . Drug use: Not Currently    Family History  Problem Relation Age of Onset  . Cancer Mother        breast  . Heart disease Mother   . Stroke Mother   . Diabetes Sister   . Colon cancer Neg Hx   . Esophageal cancer Neg Hx   . Inflammatory bowel disease Neg Hx   . Liver disease Neg Hx   . Pancreatic cancer Neg Hx   . Rectal cancer Neg Hx   . Stomach cancer Neg Hx     Review of Systems: A 12-system review of systems was performed and was negative  except as noted in the HPI.  --------------------------------------------------------------------------------------------------  Physical Exam: BP (!) 147/70 (BP Location: Left Arm, Patient Position: Sitting, Cuff Size: Normal)   Pulse (!) 57   Ht 5' 7"  (1.702 m)   Wt 148 lb 8 oz (67.4 kg)   SpO2 98%   BMI 23.26 kg/m   General: NAD. HEENT: No conjunctival pallor or scleral icterus. Moist mucous membranes.  OP clear. Neck: Supple without lymphadenopathy, thyromegaly, JVD, or HJR. No carotid bruit. Lungs: Normal work of breathing. Clear to auscultation bilaterally without wheezes or crackles. Heart: Bradycardic but regular without murmurs, rubs, or gallops. Non-displaced PMI. Abd: Bowel sounds present. Soft, NT/ND without hepatosplenomegaly Ext: No lower extremity edema.  1+ pedal pulses bilaterally.  Skin: Warm and dry without rash.  EKG: Sinus bradycardia (heart rate 57 bpm) with borderline LVH.  Otherwise, no significant abnormality.  Lab Results  Component Value Date   WBC 5.1 01/19/2019   HGB 12.7 (L) 01/19/2019   HCT 38.5 (L) 01/19/2019   MCV 102.9 (H) 01/19/2019  PLT 209.0 01/19/2019    Lab Results  Component Value Date   NA 140 01/19/2019   K 4.1 01/19/2019   CL 104 01/19/2019   CO2 27 01/19/2019   BUN 19 01/19/2019   CREATININE 0.81 01/19/2019   GLUCOSE 92 01/19/2019   ALT 14 01/19/2019    Lab Results  Component Value Date   CHOL 167 01/19/2019   HDL 83.30 01/19/2019   LDLCALC 68 01/19/2019   TRIG 74.0 01/19/2019   CHOLHDL 2 01/19/2019    --------------------------------------------------------------------------------------------------  ASSESSMENT AND PLAN: Coronary artery disease with stable angina: Overall, [name] feels similar to when we met in September, though today he presents a history more consistent with typical stable angina when walking at least 1 block.  Myocardial perfusion stress test yesterday showed no ischemia, though [name] has a history of numerous PCI's and could have small vessel and or balanced ischemia that may not be apparent on the stress test.  Given that his symptoms have not progressed significantly for months to years, I have recommended addition of isosorbide mononitrate 30 mg daily.  I have advised him to try using sublingual nitroglycerin rather than a nitroglycerin patch for breakthrough pain.  We will continue clopidogrel, metoprolol succinate, and atorvastatin for secondary prevention.  Chronic HFpEF: [name] has stable exertional dyspnea consistent with NYHA class II-III heart failure.  He appears euvolemic on exam.  Recent echo showed preserved LVEF without significant valvular disease.  No further intervention is planned at this time other than improved blood pressure control as outlined below.  Peripheral vascular disease: Claudication in both lower extremities is unchanged.  He does not have evidence of critical limb ischemia.  Recent ABIs were normal.  Duplex study showed patent SFA stents with little to no restenosis.  We will continue current medications for secondary prevention.  Hypertension: Blood pressure suboptimally controlled today.  We will therefore add isosorbide mononitrate 30 mg daily.  No other medication changes.  Hyperlipidemia: LDL at goal.  Continue atorvastatin.  Follow-up: Return to clinic in 3 months.  Nelva Bush, MD 03/08/2019 11:17 AM

## 2019-03-07 ENCOUNTER — Other Ambulatory Visit: Payer: Self-pay

## 2019-03-07 ENCOUNTER — Ambulatory Visit
Admission: RE | Admit: 2019-03-07 | Discharge: 2019-03-07 | Disposition: A | Discharge status: Home / Self Care | Payer: Medicare Other | Source: Ambulatory Visit | Attending: Internal Medicine | Admitting: Internal Medicine

## 2019-03-07 DIAGNOSIS — R079 Chest pain, unspecified: Secondary | ICD-10-CM | POA: Insufficient documentation

## 2019-03-07 MED ORDER — TECHNETIUM TC 99M TETROFOSMIN IV KIT
30.0000 | PACK | Freq: Once | INTRAVENOUS | Status: AC | PRN
  Administered 2019-03-07: 32.728 via INTRAVENOUS

## 2019-03-07 MED ORDER — REGADENOSON 0.4 MG/5ML IV SOLN
0.4000 mg | Freq: Once | INTRAVENOUS | Status: AC
  Administered 2019-03-07: 0.4 mg via INTRAVENOUS

## 2019-03-07 MED ORDER — TECHNETIUM TC 99M TETROFOSMIN IV KIT
10.0000 | PACK | Freq: Once | INTRAVENOUS | Status: AC | PRN
  Administered 2019-03-07: 10.98 via INTRAVENOUS

## 2019-03-08 ENCOUNTER — Encounter: Payer: Self-pay | Admitting: Internal Medicine

## 2019-03-08 ENCOUNTER — Ambulatory Visit (INDEPENDENT_AMBULATORY_CARE_PROVIDER_SITE_OTHER): Payer: Medicare Other | Admitting: Internal Medicine

## 2019-03-08 VITALS — BP 147/70 | HR 57 | Ht 67.0 in | Wt 148.5 lb

## 2019-03-08 DIAGNOSIS — I739 Peripheral vascular disease, unspecified: Secondary | ICD-10-CM | POA: Diagnosis not present

## 2019-03-08 DIAGNOSIS — E785 Hyperlipidemia, unspecified: Secondary | ICD-10-CM

## 2019-03-08 DIAGNOSIS — I10 Essential (primary) hypertension: Secondary | ICD-10-CM | POA: Diagnosis not present

## 2019-03-08 DIAGNOSIS — I5032 Chronic diastolic (congestive) heart failure: Secondary | ICD-10-CM

## 2019-03-08 DIAGNOSIS — I25118 Atherosclerotic heart disease of native coronary artery with other forms of angina pectoris: Secondary | ICD-10-CM | POA: Diagnosis not present

## 2019-03-08 LAB — NM MYOCAR MULTI W/SPECT W/WALL MOTION / EF
LV dias vol: 108 mL (ref 62–150)
LV sys vol: 47 mL
MPHR: 150 {beats}/min
Peak HR: 81 {beats}/min
Percent HR: 54 %
Rest HR: 47 {beats}/min
TID: 1.11

## 2019-03-08 MED ORDER — ISOSORBIDE MONONITRATE ER 30 MG PO TB24: 30.0000 mg | ORAL_TABLET | Freq: Every day | ORAL | 3 refills | Status: DC

## 2019-03-08 MED ORDER — NITROGLYCERIN 0.4 MG SL SUBL: 0.4000 mg | SUBLINGUAL_TABLET | SUBLINGUAL | 3 refills | Status: DC | PRN

## 2019-03-08 NOTE — Patient Instructions (Signed)
Medication Instructions:  Your physician has recommended you make the following change in your medication:  1- STOP Nitro patch. 2- START Imdur 30 mg by mouth once a day. 3- Nitroglycerin as needed for chest pain - Dissolve 1 tablet (0.4 mg) under your tongue every 5 minutes as needed for chest pain. Do not take more than 3 doses. If chest pain does not resolve, then call 911 or go to the Emergency Room.    *If you need a refill on your cardiac medications before your next appointment, please call your pharmacy*  Lab Work: none If you have labs (blood work) drawn today and your tests are completely normal, you will receive your results only by: Marland Kitchen MyChart Message (if you have MyChart) OR . A paper copy in the mail If you have any lab test that is abnormal or we need to change your treatment, we will call you to review the results.  Testing/Procedures: none  Follow-Up: At [facility], you and your health needs are our priority.  As part of our continuing mission to provide you with exceptional heart care, we have created designated Provider Care Teams.  These Care Teams include your primary Cardiologist (physician) and Advanced Practice Providers (APPs -  Physician Assistants and Nurse Practitioners) who all work together to provide you with the care you need, when you need it.  Your next appointment:   3 month(s)  The format for your next appointment:   In Person  Provider:    You may see DR Harrell Gave END or one of the following Advanced Practice Providers on your designated Care Team:    Murray Hodgkins, NP  Christell Faith, PA-C  Marrianne Mood, PA-C

## 2019-03-09 ENCOUNTER — Encounter: Payer: Self-pay | Admitting: Internal Medicine

## 2019-03-22 ENCOUNTER — Encounter: Payer: Self-pay | Admitting: *Deleted

## 2019-03-27 NOTE — Progress Notes (Signed)
GU Location of Tumor / Histology: prostatic adenocarcinoma  If Prostate Cancer, Gleason Score is (4 + 3) and PSA is (6.93). Prostate volume 17 cc.  Erik Nicholson had a TURP with a benign path in May 2017.  Biopsies of prostate (if applicable) revealed:    Past/Anticipated interventions by urology, if any: TURP, penile prosthesis, prostate biopsy, referral to Dr. Tammi Klippel for consideration of radioactive prostate seeds.  Past/Anticipated interventions by medical oncology, if any: no  Weight changes, if any: no  Bowel/Bladder complaints, if any: IPSS 16. SHIM 25 with penile prosthesis. Denies dysuria or hematuria. Denies urinary leakage or incontinence.Reports he suffers from colitis.  Nausea/Vomiting, if any: no  Pain issues, if any:  denies  SAFETY ISSUES:  Prior radiation? denies  Pacemaker/ICD? denies  Possible current pregnancy? no, male patient  Is the patient on methotrexate? denies  Current Complaints / other details:  68 year old male. Stopped smoking 01/1999 after 35 years of 2 ppd. Mother with hx of breast ca.

## 2019-03-28 ENCOUNTER — Ambulatory Visit
Admission: RE | Admit: 2019-03-28 | Discharge: 2019-03-28 | Disposition: A | Discharge status: Home / Self Care | Payer: Medicare Other | Source: Ambulatory Visit | Attending: Radiation Oncology | Admitting: Radiation Oncology

## 2019-03-28 ENCOUNTER — Encounter: Payer: Self-pay | Admitting: Radiation Oncology

## 2019-03-28 ENCOUNTER — Other Ambulatory Visit: Payer: Self-pay

## 2019-03-28 VITALS — Ht 67.0 in | Wt 145.0 lb

## 2019-03-28 DIAGNOSIS — C61 Malignant neoplasm of prostate: Secondary | ICD-10-CM | POA: Insufficient documentation

## 2019-03-28 HISTORY — DX: Malignant neoplasm of prostate: C61

## 2019-03-28 NOTE — Progress Notes (Signed)
Radiation Oncology         ([phone]) [phone] ________________________________  Initial outpatient Consultation - Conducted via MyChart due to current COVID-19 concerns for limiting Erik Nicholson exposure  Name: Erik Nicholson MRN: [id number]  Date: 03/28/2019  DOB: Jan 05, 1951  CC:[name], [name], MD  Kathie Rhodes, MD   REFERRING PHYSICIAN: Kathie Rhodes, MD  DIAGNOSIS: 68 y.o. gentleman with Stage T1c adenocarcinoma of the prostate with Gleason score of 4+3, and PSA of 6.93.    ICD-10-CM   1. Malignant neoplasm of prostate ([place])  C61     HISTORY OF PRESENT ILLNESS: Erik Nicholson is a 68 y.o. male with a diagnosis of prostate cancer. He notes a history of TURP in 08/2015, while he was still living in [place], that showed benign pathology. More recently, he presented to his primary care physician, Dr. Jacklynn Lewis, with incontinence. He was also noted to have an elevated PSA of 6.93 on 01/19/19.  Previous PSA was 3.91 in 08/2017 and 1.66 in 07/2016.  Accordingly, he was referred for evaluation in urology by Dr. Karsten Ro on 02/09/2019,  digital rectal examination was performed at that time revealing slight right lobe firmness without definite nodularity.  He was started on a trial of Myrbetriq and proceeded to transrectal ultrasound with 12 biopsies of the prostate on 03/10/2019.  The prostate volume measured 17.18 cc.  Out of 12 core biopsies, 4 were positive.  The maximum Gleason score was 4+3, and this was seen in the left base and left mid. Additionally, Gleason 3+4 was seen in the left base lateral and left mid lateral.  Unfortunately, he did not appreciate any benefit with the Myrbetriq.  The Erik Nicholson reviewed the biopsy results with his urologist and he has kindly been referred today for discussion of potential radiation treatment options.  PREVIOUS RADIATION THERAPY: No  PAST MEDICAL HISTORY:  Past Medical History:  Diagnosis Date   BPH (benign prostatic hyperplasia)    CAD (coronary artery  disease)    x 7 cardiac stents   Chronic back pain    L4/5 with chronic right leg pain    Hx of hepatitis C    treated with Harvoni in 2014   Neuropathy    Prostate cancer ([place])    PVD (peripheral vascular disease) ([place])    2 stents right leg and 1 in left    Ulcerative colitis ([place])    with diarrhea      PAST SURGICAL HISTORY: Past Surgical History:  Procedure Laterality Date   back surgery     x 2 lumbar last 09/2016 in [place]     last 09/2018 in [place]     right    [place] (PCI-S)     x 7-8 heart   PERIPHERAL ARTERIAL STENT GRAFT     PROSTATE BIOPSY     pvd     with stenting x 2 right leg below knee and 1 left thigh    SHOULDER ARTHROSCOPY Bilateral     FAMILY HISTORY:  Family History  Problem Relation Age of Onset   Heart disease Mother    Stroke Mother    Breast cancer Mother    Diabetes Sister    Colon cancer Neg Hx    Esophageal cancer Neg Hx    Inflammatory bowel disease Neg Hx    Liver disease Neg Hx  Pancreatic cancer Neg Hx    Rectal cancer Neg Hx    Stomach cancer Neg Hx     SOCIAL HISTORY:  Social History   Socioeconomic History   Marital status: Married    Spouse name: Not on file   Number of children: 2   Years of education: Not on file   Highest education level: Not on file  Occupational History   Not on file  Social Needs   Financial resource strain: Not on file   Food insecurity    Worry: Not on file    Inability: Not on file   Transportation needs    Medical: Not on file    Non-medical: Not on file  Tobacco Use   Smoking status: Former Smoker    Packs/day: 1.00    Years: 40.00    Pack years: 40.00    Types: Cigarettes    Quit date: 04/20/1998    Years since quitting: 20.9   Smokeless tobacco: Never Used   Tobacco comment: quit in 2000s     Substance and Sexual Activity   Alcohol use: Yes    Comment: occasional   Drug use: Not Currently   Sexual activity: Yes  Lifestyle   Physical activity    Days per week: Not on file    Minutes per session: Not on file   Stress: Not on file  Relationships   Social connections    Talks on phone: Not on file    Gets together: Not on file    Attends religious service: Not on file    Active member of club or organization: Not on file    Attends meetings of clubs or organizations: Not on file    Relationship status: Not on file   Intimate partner violence    Fear of current or ex partner: Not on file    Emotionally abused: Not on file    Physically abused: Not on file    Forced sexual activity: Not on file  Other Topics Concern   Not on file  Social History Narrative   Moved from [place] in 10/2018   2 sons in 42s as of 11/24/2018    Married wife is DPR Pamala Hurry     ALLERGIES: Cymbalta [duloxetine hcl]  MEDICATIONS:  Current Outpatient Medications  Medication Sig Dispense Refill   atorvastatin (LIPITOR) 20 MG tablet Take 1 tablet (20 mg total) by mouth daily at 6 PM. 90 tablet 2   famotidine (PEPCID) 40 MG tablet Take 1 tablet (40 mg total) by mouth 2 (two) times daily. 60 tablet 3   ferrous sulfate 325 (65 FE) MG EC tablet Take 325 mg by mouth daily.     hyoscyamine (LEVBID) 0.375 MG 12 hr tablet Take 0.375 mg by mouth 2 (two) times daily.      isosorbide mononitrate (IMDUR) 30 MG 24 hr tablet Take 1 tablet (30 mg total) by mouth daily. 90 tablet 3   lidocaine (LIDODERM) 5 %      mesalamine (LIALDA) 1.2 g EC tablet Take 2 tablets (2.4 g total) by mouth daily with breakfast. 180 tablet 3   metoprolol succinate (TOPROL-XL) 50 MG 24 hr tablet Take 1 tablet (50 mg total) by mouth daily. 90 tablet 3   Multiple Vitamin (MULTIVITAMIN) tablet Take 1 tablet by mouth daily.     pregabalin (LYRICA) 50 MG capsule Take 1 capsule (50 mg total) by mouth at bedtime  for 30 days, THEN 1 capsule (50 mg total) 2 (two)  times daily for 30 days, THEN 1 capsule (50 mg total) 3 (three) times daily. 180 capsule 0   RESTASIS 0.05 % ophthalmic emulsion Place 1 drop into both eyes 2 (two) times daily.      telmisartan-hydrochlorothiazide (MICARDIS HCT) 40-12.5 MG tablet Take 1 tablet by mouth daily. 90 tablet 3   tiZANidine (ZANAFLEX) 4 MG tablet Take 1 tablet (4 mg total) by mouth every 8 (eight) hours as needed for muscle spasms. 90 tablet 2   clopidogrel (PLAVIX) 75 MG tablet Take 75 mg by mouth every other day.      nitroGLYCERIN (NITROSTAT) 0.4 MG SL tablet Place 1 tablet (0.4 mg total) under the tongue every 5 (five) minutes as needed for chest pain. (Erik Nicholson not taking: Reported on 03/28/2019) 90 tablet 3   PREDNISONE PO Take by mouth as needed.     No current facility-administered medications for this encounter.     REVIEW OF SYSTEMS:  On review of systems, the Erik Nicholson reports that he is doing well overall. He denies any chest pain, shortness of breath, cough, fevers, chills, night sweats, unintended weight changes. He denies any bowel disturbances, and denies abdominal pain, nausea or vomiting. He denies any new musculoskeletal or joint aches or pains. His IPSS was 16, indicating moderate to severe urinary symptoms. He also reports that he suffers from colitis. His SHIM was 25 with his penile prosthesis. A complete review of systems is obtained and is otherwise negative.    PHYSICAL EXAM:  Wt Readings from Last 3 Encounters:  03/28/19 145 lb (65.8 kg)  03/08/19 148 lb 8 oz (67.4 kg)  02/15/19 133 lb (60.3 kg)   Temp Readings from Last 3 Encounters:  02/15/19 97.6 F (36.4 C) (Temporal)  01/27/19 98.1 F (36.7 C) (Oral)  01/17/19 98.7 F (37.1 C) (Oral)   BP Readings from Last 3 Encounters:  03/08/19 (!) 147/70  02/15/19 (!) 141/97  01/27/19 118/64   Pulse Readings from Last 3 Encounters:  03/08/19 (!) 57  02/15/19 74  01/27/19 (!) 55    Pain Assessment Pain Score: 0-No pain/10  In general this is a well appearing African American gentleman in no acute distress. He's alert and oriented x4 and appropriate throughout the examination. Cardiopulmonary assessment is negative for acute distress and he exhibits normal effort.   KPS = 90  100 - Normal; no complaints; no evidence of disease. 90   - Able to carry on normal activity; minor signs or symptoms of disease. 80   - Normal activity with effort; some signs or symptoms of disease. 51   - Cares for self; unable to carry on normal activity or to do active work. 60   - Requires occasional assistance, but is able to care for most of his personal needs. 50   - Requires considerable assistance and frequent medical care. 53   - Disabled; requires special care and assistance. 25   - Severely disabled; hospital admission is indicated although death not imminent. 60   - Very sick; hospital admission necessary; active supportive treatment necessary. 10   - Moribund; fatal processes progressing rapidly. 0     - Dead  Karnofsky DA, Abelmann Woodward, Craver LS and [facility] [phone]) The use of the nitrogen mustards in the palliative treatment of carcinoma: with particular reference to bronchogenic carcinoma Cancer 1 634-56  LABORATORY DATA:  Lab Results  Component Value Date   WBC 5.1 01/19/2019   HGB 12.7 (L) 01/19/2019   HCT 38.5 (L) 01/19/2019  MCV 102.9 (H) 01/19/2019   PLT 209.0 01/19/2019   Lab Results  Component Value Date   NA 140 01/19/2019   K 4.1 01/19/2019   CL 104 01/19/2019   CO2 27 01/19/2019   Lab Results  Component Value Date   ALT 14 01/19/2019   AST 18 01/19/2019   ALKPHOS 52 01/19/2019   BILITOT 0.8 01/19/2019     RADIOGRAPHY: Nm Myocar Multi W/spect W/wall Motion / Ef  Result Date: 03/08/2019  Normal pharmacologic myocardial perfusion stress test without significant ischemia or scar.  The left ventricular ejection fraction is normal by visual  estimation and Siemens calculation (56%).  Dense coronary artery calcifications and/or prior coronary stents are noted on the attenuation correction CT.  This is a low risk study.    Vas [place] Burnard Bunting With/wo Tbi  Result Date: 03/03/2019 LOWER EXTREMITY DOPPLER STUDY Indications: Peripheral artery disease, and Shooting pain from the right              anterior lower leg, through the foot. Intermittent throughout the              day. Symptom has been present for many years and is not              consistently associated with walking. High Risk Factors: Hypertension, hyperlipidemia, past history of smoking,                    coronary artery disease. Other Factors: Chronic low back pain from L4/5 degeneration, with chronic right                leg pain                 LEA duplex also performed due to report of bilateral SFA                stenting.  Vascular Interventions: 02/19/14 Right mid to distal SFA atherectomy and                         stenting.                         04/26/12 (?) left SFA PTA and stenting. Aortogram                         documented bilateral run-off. Comparison Study: None available Performing Technologist: Pilar Jarvis RDMS, RVT, RDCS  Examination Guidelines: A complete evaluation includes at minimum, Doppler waveform signals and systolic blood pressure reading at the level of bilateral brachial, anterior tibial, and posterior tibial arteries, when vessel segments are accessible. Bilateral testing is considered an integral part of a complete examination. Photoelectric Plethysmograph (PPG) waveforms and toe systolic pressure readings are included as required and additional duplex testing as needed. Limited examinations for reoccurring indications may be performed as noted.  ABI Findings: +---------+------------------+-----+---------+-----------------+  Right     Rt Pressure (mmHg) Index Waveform  Comment            +---------+------------------+-----+---------+-----------------+  Brachial   157                      triphasic                    +---------+------------------+-----+---------+-----------------+  ATA       174  1.11  triphasic                    +---------+------------------+-----+---------+-----------------+  PTA       178                1.13            at least biphasic  +---------+------------------+-----+---------+-----------------+  PERO      173                1.10            at least biphasic  +---------+------------------+-----+---------+-----------------+  Great Toe 112                0.71  Normal                       +---------+------------------+-----+---------+-----------------+ +---------+------------------+-----+---------+-------+  Left      Lt Pressure (mmHg) Index Waveform  Comment  +---------+------------------+-----+---------+-------+  Brachial  153                      triphasic          +---------+------------------+-----+---------+-------+  ATA       169                1.08  triphasic          +---------+------------------+-----+---------+-------+  PTA       159                1.01  triphasic          +---------+------------------+-----+---------+-------+  PERO      171                1.09  triphasic          +---------+------------------+-----+---------+-------+  Great Toe 180                1.15  Normal             +---------+------------------+-----+---------+-------+ +-------+-----------+-----------+------------+------------+  ABI/TBI Today's ABI Today's TBI Previous ABI Previous TBI  +-------+-----------+-----------+------------+------------+  Right   1.13        0.71                                   +-------+-----------+-----------+------------+------------+  Left    1.09        1.15                                   +-------+-----------+-----------+------------+------------+  Summary: Right: Resting right ankle-brachial index is within normal range. No evidence of significant right lower extremity arterial disease. The right toe-brachial index is  normal. Left: Resting left ankle-brachial index is within normal range. No evidence of significant left lower extremity arterial disease. The left toe-brachial index is normal.  *See table(s) above for measurements and observations.  Suggest follow up study in 12 months. Electronically signed by Carlyle Dolly MD on 03/03/2019 at 12:39:21 PM.    Final    Vas [place] Lower Extremity Arterial Duplex  Result Date: 03/03/2019 LOWER EXTREMITY ARTERIAL DUPLEX STUDY Indications: Right anterior lower leg pain, through the foot. Intermittent and              not consistently brought on by walking. Symptom present for years. High Risk Factors: Hypertension, hyperlipidemia, past history of smoking,  coronary artery disease. Other Factors: L4/5 lower back surgery and pain, with chronic right leg pain.  Vascular Interventions: 2015 Right SFA PTA and stenting                         2014 left SFA PTA and stenting. Aortogram documented                         calf run-off. Current ABI:            1.13 right and 1.09 left Comparison Study: None Performing Technologist: Pilar Jarvis RDMS, RVT, RDCS  Examination Guidelines: A complete evaluation includes B-mode imaging, spectral Doppler, color Doppler, and power Doppler as needed of all accessible portions of each vessel. Bilateral testing is considered an integral part of a complete examination. Limited examinations for reoccurring indications may be performed as noted.  +----------+--------+-----+--------+---------+--------+  RIGHT      PSV cm/s Ratio Stenosis Waveform  Comments  +----------+--------+-----+--------+---------+--------+  CFA Prox   95                      triphasic           +----------+--------+-----+--------+---------+--------+  CFA Mid    152                     biphasic            +----------+--------+-----+--------+---------+--------+  CFA Distal 100                     biphasic            +----------+--------+-----+--------+---------+--------+   DFA        199                     biphasic            +----------+--------+-----+--------+---------+--------+  SFA Prox   129                     biphasic            +----------+--------+-----+--------+---------+--------+  POP Prox   81                      biphasic            +----------+--------+-----+--------+---------+--------+  POP Mid    72                      biphasic            +----------+--------+-----+--------+---------+--------+  POP Distal 70                      biphasic            +----------+--------+-----+--------+---------+--------+  TP Trunk   88                      triphasic           +----------+--------+-----+--------+---------+--------+  Right Stent(s): +----------------+--------+---------------+---------+--------+  Mid - distal SFA PSV cm/s Stenosis        Waveform  Comments  +----------------+--------+---------------+---------+--------+  Prox to Stent    131      no stenosis     triphasic           +----------------+--------+---------------+---------+--------+  Proximal Stent   96       no  stenosis     triphasic           +----------------+--------+---------------+---------+--------+  Mid Stent        258      50-99% stenosis triphasic           +----------------+--------+---------------+---------+--------+  Distal Stent     122      no stenosis     biphasic            +----------------+--------+---------------+---------+--------+  Distal to Stent  124      no stenosis     triphasic           +----------------+--------+---------------+---------+--------+    +----------+--------+-----+--------+---------+--------+  LEFT       PSV cm/s Ratio Stenosis Waveform  Comments  +----------+--------+-----+--------+---------+--------+  CFA Prox   118                     triphasic           +----------+--------+-----+--------+---------+--------+  CFA Mid    97                      triphasic           +----------+--------+-----+--------+---------+--------+  CFA Distal 104                     triphasic            +----------+--------+-----+--------+---------+--------+  DFA        191                     triphasic           +----------+--------+-----+--------+---------+--------+  SFA Prox   88                      biphasic            +----------+--------+-----+--------+---------+--------+  POP Prox   62                      triphasic           +----------+--------+-----+--------+---------+--------+  POP Mid    55                      triphasic           +----------+--------+-----+--------+---------+--------+  POP Distal 54                      biphasic            +----------+--------+-----+--------+---------+--------+  TP Trunk   68                      triphasic           +----------+--------+-----+--------+---------+--------+  Left Stent(s): +-----------------+--------+-----------+---------+--------+  mid to distal SFA PSV cm/s Stenosis    Waveform  Comments  +-----------------+--------+-----------+---------+--------+  Prox to Stent     78       no stenosis triphasic           +-----------------+--------+-----------+---------+--------+  Proximal Stent    116      no stenosis triphasic           +-----------------+--------+-----------+---------+--------+  Mid Stent         88       no stenosis triphasic           +-----------------+--------+-----------+---------+--------+  Distal Stent      101  no stenosis triphasic           +-----------------+--------+-----------+---------+--------+  Distal to Stent   67       no stenosis triphasic           +-----------------+--------+-----------+---------+--------+    Summary: Right: Near normal examination. Stenosis is noted within the Mid stent. Extensive echogenic plaque. Left: Normal examination. No evidence of arterial occlusive disease. Patent stent with no evidence of stenosis in the Mid to distal SFA artery. Extensive echogenic plaque with no focal stenosis.  See table(s) above for measurements and observations. Suggest follow up study in 12 months. Electronically  signed by Carlyle Dolly MD on 03/03/2019 at 12:45:12 PM.    Final       IMPRESSION/PLAN: This visit was conducted via MyChart to spare the Erik Nicholson unnecessary potential exposure in the healthcare setting during the current COVID-19 pandemic. 1. 68 y.o. gentleman with Stage T1c adenocarcinoma of the prostate with Gleason Score of 4+3, and PSA of 6.93. We discussed the Erik Nicholson's workup and outlined the nature of prostate cancer in this setting. The Erik Nicholson's T stage, Gleason's score, and PSA put him into the unfavorable intermediate risk group. Accordingly, he is eligible for a variety of potential treatment options including brachytherapy, 5.5 weeks of external radiation or prostatectomy. We discussed that he is not a candidate for brachytherapy in light of his previous TURP, small prostate size and persistent LUTS. We discussed the available radiation techniques, and focused on the details and logistics of the delivery of daily EBRT prostate. Should he elect to proceed with radiotherapy, we would recommend a 5.5 week course of daily prostate IMRT. We discussed and outlined the risks, benefits, short and long-term effects associated with radiotherapy and compared and contrasted these with prostatectomy. We discussed the role of SpaceOAR in reducing the rectal toxicity associated with radiotherapy. He was encouraged to ask questions that were answered to his stated satisfaction.  At the end of the conversation, the Erik Nicholson is interested in moving forward with 5.5 weeks of external beam therapy to the prostate. We will share our discussion with Dr. Karsten Ro and move forward with coordinating for fiducial marker with SpaceOAR gel placement, prior to CT simulation, to reduce rectal toxicity from radiotherapy. He appears to have a good understanding of his disease and our treatment recommendations which are of curative intent and he is in agreement with the stated plan. We will proceed with treatment planning  accordingly.  Given current concerns for Erik Nicholson exposure during the COVID-19 pandemic, this encounter was conducted via video-enabled MyChart visit. The Erik Nicholson has given verbal consent for this type of encounter. The time spent during this encounter was 60 minutes. The attendants for this meeting include Tyler Pita MD, Ashlyn Bruning PA-C, Erik Nicholson, Erik Nicholson, Erik Nicholson. During the encounter, Tyler Pita MD, Ashlyn Bruning PA-C, and scribe, Wilburn Mylar were located at [facility].  Erik Nicholson, Erik Nicholson was located at home.    Nicholos Johns, PA-C    Tyler Pita, MD  [facility] Direct Dial: [phone]   Fax: [phone] [email or web address]   Skype   [email or web address]  This document serves as a record of services personally performed by Tyler Pita, MD and Freeman Caldron, PA-C. It was created on their behalf by Wilburn Mylar, a trained medical scribe. The creation of this record is based on the scribe's personal observations and the provider's statements to them. This document has been checked and approved by  the attending provider.

## 2019-04-03 ENCOUNTER — Telehealth: Payer: Self-pay | Admitting: Medical Oncology

## 2019-04-03 NOTE — Telephone Encounter (Signed)
Spoke with patient and his wife, to introduce myself as the prostate nurse navigator and discuss my role. I was unable to meet him 12/8, when he consulted with Dr. Tammi Klippel. He states the consult went well and he has decided on 5 1/2 weeks of external beam. He had questions about scheduling and the procedure. All questions were answered and I gave them my contact information. I asked them to call me with questions or concerns. They voiced understanding and thanked me for the call.

## 2019-05-03 ENCOUNTER — Telehealth: Payer: Self-pay

## 2019-05-03 ENCOUNTER — Encounter: Payer: Self-pay | Admitting: Internal Medicine

## 2019-05-03 ENCOUNTER — Other Ambulatory Visit: Payer: Self-pay | Admitting: Student in an Organized Health Care Education/Training Program

## 2019-05-03 ENCOUNTER — Ambulatory Visit (INDEPENDENT_AMBULATORY_CARE_PROVIDER_SITE_OTHER): Payer: Medicare Other | Admitting: Internal Medicine

## 2019-05-03 ENCOUNTER — Other Ambulatory Visit: Payer: Self-pay

## 2019-05-03 VITALS — Ht 67.0 in | Wt 145.0 lb

## 2019-05-03 DIAGNOSIS — C61 Malignant neoplasm of prostate: Secondary | ICD-10-CM | POA: Insufficient documentation

## 2019-05-03 DIAGNOSIS — M5416 Radiculopathy, lumbar region: Secondary | ICD-10-CM | POA: Diagnosis not present

## 2019-05-03 DIAGNOSIS — K51919 Ulcerative colitis, unspecified with unspecified complications: Secondary | ICD-10-CM

## 2019-05-03 DIAGNOSIS — R29898 Other symptoms and signs involving the musculoskeletal system: Secondary | ICD-10-CM | POA: Insufficient documentation

## 2019-05-03 DIAGNOSIS — I739 Peripheral vascular disease, unspecified: Secondary | ICD-10-CM

## 2019-05-03 DIAGNOSIS — R600 Localized edema: Secondary | ICD-10-CM | POA: Insufficient documentation

## 2019-05-03 HISTORY — DX: Localized edema: R60.0

## 2019-05-03 HISTORY — DX: Other symptoms and signs involving the musculoskeletal system: R29.898

## 2019-05-03 NOTE — Telephone Encounter (Signed)
Sent to Osvaldo Angst for review

## 2019-05-03 NOTE — Progress Notes (Addendum)
Virtual Visit via Video Note  I connected with Erik Nicholson  on 05/03/19 at 10:50 AM EST by a video enabled telemedicine application and verified that I am speaking with the correct person using two identifiers.  Location patient: home Location provider:work or home office Persons participating in the virtual visit: patient, provider, pts wife  I discussed the limitations of evaluation and management by telemedicine and the availability of in person appointments. The patient expressed understanding and agreed to proceed.   HPI: 1. Recurrent prostate cancer on the right side per pt he saw [facility] Dr. Karsten Ro before holidays reports radiation tx needed Dr. Tammi Klippel. He does not want surgery and no one as of 05/03/19 has sch any f/u tx for him from the practiced  I advised pt will reach out to urology  2. Chronic back pain s/p surgery x 2 with back spasms does not want to take cymbalta pain clinic Rx lyrica but no narotics drug screen 12/2018 +THC per pt no use since 4 or 08/2018. Back pain flared w/in the last 2 weeks with spasm L/4/5. He purchased new bed supposed to be firm but soft. He has seen ortho/NS in the past pain is cyclical worse W2-5 years. He had recently trouble standing, lying, has tried epidural injection in the past which helped x 6 months pain 11/10 sharp, stabbing wife has to help out of bed as back locks and c/o right leg weakness  3. H/o PVD and chronic right leg edema will refeer vascular in [place] 4. H/o UC with active colitis will CC GI to see when colonoscopy will be schedule      ROS: See pertinent positives and negatives per HPI. Psych: denies depression  Past Medical History:  Diagnosis Date  . BPH (benign prostatic hyperplasia)   . CAD (coronary artery disease)    x 7 cardiac stents  . Chronic back pain    L4/5 with chronic right leg pain   . Hx of hepatitis C    treated with Harvoni in 2014  . Neuropathy   . Prostate cancer ([place])   . PVD (peripheral  vascular disease) ([place])    2 stents right leg and 1 in left   . Ulcerative colitis ([place])    with diarrhea    Past Surgical History:  Procedure Laterality Date  . back surgery     x 2 lumbar last 09/2016 in [place]  . CARDIAC CATHETERIZATION    . COLONOSCOPY     last 09/2018 in [place]  . HERNIA REPAIR     right   . PENILE PROSTHESIS IMPLANT    . PERCUTANEOUS CORONARY STENT INTERVENTION (PCI-S)     x 7-8 heart  . PERIPHERAL ARTERIAL STENT GRAFT    . PROSTATE BIOPSY    . pvd     with stenting x 2 right leg below knee and 1 left thigh   . SHOULDER ARTHROSCOPY Bilateral     Family History  Problem Relation Age of Onset  . Heart disease Mother   . Stroke Mother   . Breast cancer Mother   . Diabetes Sister   . Colon cancer Neg Hx   . Esophageal cancer Neg Hx   . Inflammatory bowel disease Neg Hx   . Liver disease Neg Hx   . Pancreatic cancer Neg Hx   . Rectal cancer Neg Hx   . Stomach cancer Neg Hx     SOCIAL HX: married x 45 years   Current  Outpatient Medications:  .  atorvastatin (LIPITOR) 20 MG tablet, Take 1 tablet (20 mg total) by mouth daily at 6 PM., Disp: 90 tablet, Rfl: 2 .  clopidogrel (PLAVIX) 75 MG tablet, Take 75 mg by mouth every other day. , Disp: , Rfl:  .  famotidine (PEPCID) 40 MG tablet, Take 1 tablet (40 mg total) by mouth 2 (two) times daily., Disp: 60 tablet, Rfl: 3 .  ferrous sulfate 325 (65 FE) MG EC tablet, Take 325 mg by mouth daily., Disp: , Rfl:  .  hyoscyamine (LEVBID) 0.375 MG 12 hr tablet, Take 0.375 mg by mouth 2 (two) times daily. , Disp: , Rfl:  .  isosorbide mononitrate (IMDUR) 30 MG 24 hr tablet, Take 1 tablet (30 mg total) by mouth daily., Disp: 90 tablet, Rfl: 3 .  lidocaine (LIDODERM) 5 %, , Disp: , Rfl:  .  mesalamine (LIALDA) 1.2 g EC tablet, Take 2 tablets (2.4 g total) by mouth daily with breakfast., Disp: 180 tablet, Rfl: 3 .  metoprolol succinate (TOPROL-XL) 50 MG 24 hr tablet, Take 1 tablet (50 mg total) by  mouth daily., Disp: 90 tablet, Rfl: 3 .  Multiple Vitamin (MULTIVITAMIN) tablet, Take 1 tablet by mouth daily., Disp: , Rfl:  .  nitroGLYCERIN (NITROSTAT) 0.4 MG SL tablet, Place 1 tablet (0.4 mg total) under the tongue every 5 (five) minutes as needed for chest pain., Disp: 90 tablet, Rfl: 3 .  PREDNISONE PO, Take by mouth as needed., Disp: , Rfl:  .  pregabalin (LYRICA) 50 MG capsule, Take 1 capsule (50 mg total) by mouth at bedtime for 30 days, THEN 1 capsule (50 mg total) 2 (two) times daily for 30 days, THEN 1 capsule (50 mg total) 3 (three) times daily., Disp: 180 capsule, Rfl: 0 .  RESTASIS 0.05 % ophthalmic emulsion, Place 1 drop into both eyes 2 (two) times daily. , Disp: , Rfl:  .  telmisartan-hydrochlorothiazide (MICARDIS HCT) 40-12.5 MG tablet, Take 1 tablet by mouth daily., Disp: 90 tablet, Rfl: 3 .  tiZANidine (ZANAFLEX) 4 MG tablet, Take 1 tablet (4 mg total) by mouth every 8 (eight) hours as needed for muscle spasms., Disp: 90 tablet, Rfl: 2  EXAM:  VITALS per patient if applicable:  GENERAL: alert, oriented, appears well and in no acute distress  HEENT: atraumatic, conjunttiva clear, no obvious abnormalities on inspection of external nose and ears  NECK: normal movements of the head and neck  LUNGS: on inspection no signs of respiratory distress, breathing rate appears normal, no obvious gross SOB, gasping or wheezing  CV: no obvious cyanosis  MS: moves all visible extremities without noticeable abnormality  PSYCH/NEURO: pleasant and cooperative, no obvious depression or anxiety, speech and thought processing grossly intact  ASSESSMENT AND PLAN:  Discussed the following assessment and plan:  Lumbar radiculopathy s/p back surgery x 2 - Plan: MR Lumbar Spine Wo Contrast Right leg weakness - Plan: MR Lumbar Spine Wo Contrast -consider NS vs pm&R in future Pain clinic did not Rx narcotics due to thc + 12/2018 uds does not wish to return per pt currently not using THC    Prostate cancer ([place]), recurrently  Pt has prostate cancer seen before holidays speaking with him today no f/u treatment schedule as of yet  Also does he need CT ab/pelvis, bone scan, PET scan surgery with Dr. Alinda Money though he states he is not interested in surgery Please let me know and patient he is ready to proceed with treatment Sent Dr. Karsten Ro  this message with reply  Urology f/u on 05/23/19.   The patient was seen by the radiation oncologist Dr. Tammi Klippel on 04/04/19. At that time the options were discussed and he elected to proceed with radiation therapy. He is supposed to be receiving radiation therapy from Dr. Tammi Klippel. No further imaging studies are necessary.     Ulcerative colitis with complication -cc [facility] to sch colonoscopy active colitis when will this be scheduled  PVD (peripheral vascular disease) ([place]) - Plan: Ambulatory referral to Vascular Surgery Leg edema, right - Plan: Ambulatory referral to Vascular Surgery Dr. Carloyn Jaeger   HM Get copy of records in future from Dr. Lyman Speller in [place] MD (I.e vaccines utd flu shot 01/04/19 -vaccines ROI sent again today   colonoscopy/EGD repeat pending [facility]  Colonoscopy per pt last 09/2018 in [place] pt Former smoker quit in 2000s   PCP Dr. Lyman Speller pt agreeable will get copy of colonoscopy, labs and vaccines  Recurrent prostate cancer tx tbd Dr. Karsten Ro [facility]  Pt ok to get records Dr. Lyman Speller  Dr. Stephanie Coup pain clinic appt 06/07/19 Rx percocet 5-325 1/2 po bid prn lyrica 50 mg bid rtc in 3 weeks   -we discussed possible serious and likely etiologies, options for evaluation and workup, limitations of telemedicine visit vs in person visit, treatment, treatment risks and precautions. Pt prefers to treat via telemedicine empirically rather then risking or undertaking an in person visit at this moment. Patient agrees to seek prompt in person care if worsening, new symptoms arise, or  if is not improving with treatment.   I discussed the assessment and treatment plan with the patient. The patient was provided an opportunity to ask questions and all were answered. The patient agreed with the plan and demonstrated an understanding of the instructions.   The patient was advised to call back or seek an in-person evaluation if the symptoms worsen or if the condition fails to improve as anticipated.  Time spent 30-39 min Delorise Jackson, MD

## 2019-05-03 NOTE — Telephone Encounter (Signed)
-----   Message from Irving Copas., MD sent at 05/03/2019  3:20 PM EST ----- Dr. Terese Door, Thanks for reaching out. We hadn't heard from him in awhile. I see his Cardiac workup has been completed. We should get him set up for his EGD/Colonoscopy soon. Lareina Espino, move forward with scheduling the patient his EGD/Colonoscopy. He has had a Cardiac Workup including MIBI and Stress Test. His case can be reviewed by Anesthesia if necessary, but next available slot in [place] for when patient can do procedures, and we should get him done. Thanks. Please let Dr. Terese Door and I know when patient agrees to procedures. Thanks. GM ----- Message ----- From: [name], [name], MD Sent: 05/03/2019  11:24 AM EST To: Irving Copas., MD  Hi  When will colonoscopy be sch pt UC he has colitis active currently?   Thanks [employer]

## 2019-05-04 ENCOUNTER — Other Ambulatory Visit: Payer: Self-pay

## 2019-05-04 DIAGNOSIS — K5100 Ulcerative (chronic) pancolitis without complications: Secondary | ICD-10-CM

## 2019-05-04 DIAGNOSIS — Z1159 Encounter for screening for other viral diseases: Secondary | ICD-10-CM

## 2019-05-04 NOTE — Telephone Encounter (Signed)
The pt has been advised and scheduled for endo colon on 05/30/19 in the [facility].  He has also been scheduled for previsit and COVID testing.  The pt has been advised of the information and verbalized understanding.

## 2019-05-04 NOTE — Telephone Encounter (Signed)
Thanks for update. GM

## 2019-05-04 NOTE — Telephone Encounter (Signed)
Osvaldo Angst, CRNA  Timothy Lasso, RN  Ewan Grau,   This pt is cleared for anesthetic care at [facility].   Thanks,   Osvaldo Angst

## 2019-05-07 ENCOUNTER — Other Ambulatory Visit: Payer: Self-pay

## 2019-05-07 ENCOUNTER — Ambulatory Visit
Admission: RE | Admit: 2019-05-07 | Discharge: 2019-05-07 | Disposition: A | Discharge status: Home / Self Care | Payer: Medicare Other | Source: Ambulatory Visit | Attending: Internal Medicine | Admitting: Internal Medicine

## 2019-05-07 DIAGNOSIS — M5416 Radiculopathy, lumbar region: Secondary | ICD-10-CM

## 2019-05-07 DIAGNOSIS — R29898 Other symptoms and signs involving the musculoskeletal system: Secondary | ICD-10-CM | POA: Diagnosis present

## 2019-05-08 ENCOUNTER — Other Ambulatory Visit: Payer: Self-pay | Admitting: Internal Medicine

## 2019-05-08 ENCOUNTER — Telehealth: Payer: Self-pay | Admitting: Lab

## 2019-05-08 DIAGNOSIS — Z9889 Other specified postprocedural states: Secondary | ICD-10-CM

## 2019-05-08 DIAGNOSIS — R937 Abnormal findings on diagnostic imaging of other parts of musculoskeletal system: Secondary | ICD-10-CM

## 2019-05-08 DIAGNOSIS — M5136 Other intervertebral disc degeneration, lumbar region: Secondary | ICD-10-CM

## 2019-05-08 DIAGNOSIS — M5416 Radiculopathy, lumbar region: Secondary | ICD-10-CM

## 2019-05-08 DIAGNOSIS — R29898 Other symptoms and signs involving the musculoskeletal system: Secondary | ICD-10-CM

## 2019-05-08 DIAGNOSIS — G894 Chronic pain syndrome: Secondary | ICD-10-CM

## 2019-05-08 DIAGNOSIS — M545 Low back pain, unspecified: Secondary | ICD-10-CM

## 2019-05-08 DIAGNOSIS — G8929 Other chronic pain: Secondary | ICD-10-CM

## 2019-05-08 DIAGNOSIS — M549 Dorsalgia, unspecified: Secondary | ICD-10-CM

## 2019-05-08 DIAGNOSIS — E785 Hyperlipidemia, unspecified: Secondary | ICD-10-CM

## 2019-05-08 NOTE — Telephone Encounter (Signed)
Called Pt back No answer and Mailbox was Full

## 2019-05-09 ENCOUNTER — Telehealth: Payer: Self-pay | Admitting: *Deleted

## 2019-05-09 ENCOUNTER — Other Ambulatory Visit: Payer: Self-pay | Admitting: Urology

## 2019-05-09 DIAGNOSIS — C61 Malignant neoplasm of prostate: Secondary | ICD-10-CM

## 2019-05-09 NOTE — Telephone Encounter (Signed)
Called patient to inform of appt. for fid. Markers and space oar placement on Feb. 2 @ 8:30 am @ [facility] and his sim on 05-26-19 @ 10 am @ Dr. Johny Shears Office, unable to leave voice mail, vm full, will call later

## 2019-05-11 ENCOUNTER — Telehealth: Payer: Self-pay | Admitting: *Deleted

## 2019-05-11 NOTE — Telephone Encounter (Signed)
The pt stopped blood thinners 2 months ago.  He was ok'd by cardiology to proceed with procedure.  Thanks for checking.

## 2019-05-11 NOTE — Telephone Encounter (Signed)
Rovonda  I do not see a Plavix hold on this pt for his procedure 2-9- I see it was sent in October but they requested Cardiac studies that have been completed but no ok to hold Plavix.  Please let me know if I just have missed this- if not, can you resend for Plavix hold   Thanks- Lelan Pons

## 2019-05-16 ENCOUNTER — Telehealth (HOSPITAL_COMMUNITY): Payer: Self-pay

## 2019-05-16 ENCOUNTER — Other Ambulatory Visit: Payer: Self-pay

## 2019-05-16 DIAGNOSIS — I739 Peripheral vascular disease, unspecified: Secondary | ICD-10-CM

## 2019-05-16 NOTE — Telephone Encounter (Signed)

## 2019-05-17 ENCOUNTER — Other Ambulatory Visit: Payer: Self-pay

## 2019-05-17 ENCOUNTER — Encounter: Payer: Self-pay | Admitting: Vascular Surgery

## 2019-05-17 ENCOUNTER — Ambulatory Visit (HOSPITAL_COMMUNITY)
Admission: RE | Admit: 2019-05-17 | Discharge: 2019-05-17 | Disposition: A | Discharge status: Home / Self Care | Payer: Medicare Other | Source: Ambulatory Visit | Attending: Surgery | Admitting: Surgery

## 2019-05-17 ENCOUNTER — Ambulatory Visit (INDEPENDENT_AMBULATORY_CARE_PROVIDER_SITE_OTHER): Payer: Medicare Other | Admitting: Vascular Surgery

## 2019-05-17 VITALS — BP 150/78 | HR 68 | Temp 98.2°F | Resp 20 | Ht 67.0 in | Wt 143.0 lb

## 2019-05-17 DIAGNOSIS — I739 Peripheral vascular disease, unspecified: Secondary | ICD-10-CM | POA: Diagnosis present

## 2019-05-17 NOTE — Progress Notes (Signed)
REASON FOR CONSULT:    To evaluate for peripheral vascular disease.  The consult is requested by Dr. Olivia Mackie [name].   ASSESSMENT & PLAN:   PERIPHERAL VASCULAR DISEASE: I do not think that the patient's symptoms can be attributed to his peripheral vascular disease.  He has undergone previous angioplasty and stenting of both lower extremities but currently has palpable pedal pulses, normal Doppler signals in both feet, and normal ABIs.  I do not think any further vascular work-up is indicated at this time.  His symptoms sound nerve related and he is being worked up for this.  I believe he is being considered for a thoracic spine MRI.  He has had previous back surgery.  He is not a smoker.  I encouraged him to stay as active as possible.  I will be happy to see him back at any time if any new vascular issues arise.   Deitra Mayo, MD Office: [phone]   HPI:   Erik Nicholson is a pleasant 69 y.o. male, who presents for evaluation of peripheral vascular disease.  He tells me that he did have a stents in both lower extremities in [place] many years ago but does not remember all the details.  He does not describe any claudication, rest pain, or nonhealing ulcers.  He describes pain in his back which radiates down his legs.  He describes paresthesias in his lower right leg and right foot  He has various sensations all of which sound nerve related.  His only real risk factor for peripheral vascular disease is hypertension and hypercholesterolemia.  He denies any history of diabetes, family history of premature cardiovascular disease, or tobacco use.  Past Medical History:  Diagnosis Date  . BPH (benign prostatic hyperplasia)   . CAD (coronary artery disease)    x 7 cardiac stents  . Chronic back pain    L4/5 with chronic right leg pain   . Hx of hepatitis C    treated with Harvoni in 2014  . Hyperlipidemia   . Hypertension   . Neuropathy   . Prostate cancer ([place])   . PVD  (peripheral vascular disease) ([place])    2 stents right leg and 1 in left   . Ulcerative colitis ([place])    with diarrhea    Family History  Problem Relation Age of Onset  . Heart disease Mother   . Stroke Mother   . Breast cancer Mother   . Diabetes Sister   . Colon cancer Neg Hx   . Esophageal cancer Neg Hx   . Inflammatory bowel disease Neg Hx   . Liver disease Neg Hx   . Pancreatic cancer Neg Hx   . Rectal cancer Neg Hx   . Stomach cancer Neg Hx     SOCIAL HISTORY: Social History   Socioeconomic History  . Marital status: Married    Spouse name: Not on file  . Number of children: 2  . Years of education: Not on file  . Highest education level: Not on file  Occupational History  . Not on file  Tobacco Use  . Smoking status: Former Smoker    Packs/day: 1.00    Years: 40.00    Pack years: 40.00    Types: Cigarettes    Quit date: 04/20/1998    Years since quitting: 21.0  . Smokeless tobacco: Never Used  . Tobacco comment: quit in 2000s   Substance and Sexual Activity  . Alcohol use: Yes    Comment:  occasional  . Drug use: Not Currently  . Sexual activity: Yes  Other Topics Concern  . Not on file  Social History Narrative   Moved from [place] in 10/2018   2 sons in 19s as of 11/24/2018    Married wife is DPR Pamala Hurry x 45 years as of 04/2019   Social Determinants of Health   Financial Resource Strain:   . Difficulty of Paying Living Expenses: Not on file  Food Insecurity:   . Worried About Charity fundraiser in the Last Year: Not on file  . Ran Out of Food in the Last Year: Not on file  Transportation Needs:   . Lack of Transportation (Medical): Not on file  . Lack of Transportation (Non-Medical): Not on file  Physical Activity:   . Days of Exercise per Week: Not on file  . Minutes of Exercise per Session: Not on file  Stress:   . Feeling of Stress : Not on file  Social Connections:   . Frequency of Communication with Friends and Family: Not on file   . Frequency of Social Gatherings with Friends and Family: Not on file  . Attends Religious Services: Not on file  . Active Member of Clubs or Organizations: Not on file  . Attends Archivist Meetings: Not on file  . Marital Status: Not on file  Intimate Partner Violence:   . Fear of Current or Ex-Partner: Not on file  . Emotionally Abused: Not on file  . Physically Abused: Not on file  . Sexually Abused: Not on file    Allergies  Allergen Reactions  . Cymbalta [Duloxetine Hcl] Other (See Comments)    drowziness    Current Outpatient Medications  Medication Sig Dispense Refill  . hyoscyamine (LEVBID) 0.375 MG 12 hr tablet Take 0.375 mg by mouth 2 (two) times daily.     . isosorbide mononitrate (IMDUR) 30 MG 24 hr tablet Take 1 tablet (30 mg total) by mouth daily. 90 tablet 3  . lidocaine (LIDODERM) 5 %     . mesalamine (LIALDA) 1.2 g EC tablet Take 2 tablets (2.4 g total) by mouth daily with breakfast. 180 tablet 3  . metoprolol succinate (TOPROL-XL) 50 MG 24 hr tablet Take 1 tablet (50 mg total) by mouth daily. 90 tablet 3  . nitroGLYCERIN (NITROSTAT) 0.4 MG SL tablet Place 1 tablet (0.4 mg total) under the tongue every 5 (five) minutes as needed for chest pain. 90 tablet 3  . PREDNISONE PO Take by mouth as needed.    . RESTASIS 0.05 % ophthalmic emulsion Place 1 drop into both eyes 2 (two) times daily.     Marland Kitchen telmisartan-hydrochlorothiazide (MICARDIS HCT) 40-12.5 MG tablet Take 1 tablet by mouth daily. 90 tablet 3  . atorvastatin (LIPITOR) 20 MG tablet Take 1 tablet (20 mg total) by mouth daily at 6 PM. 90 tablet 2  . famotidine (PEPCID) 40 MG tablet Take 1 tablet (40 mg total) by mouth 2 (two) times daily. 60 tablet 3  . ferrous sulfate 325 (65 FE) MG EC tablet Take 325 mg by mouth daily.    . Multiple Vitamin (MULTIVITAMIN) tablet Take 1 tablet by mouth daily.    . pregabalin (LYRICA) 50 MG capsule Take 1 capsule (50 mg total) by mouth at bedtime for 30 days, THEN 1  capsule (50 mg total) 2 (two) times daily for 30 days, THEN 1 capsule (50 mg total) 3 (three) times daily. 180 capsule 0   No current  facility-administered medications for this visit.    REVIEW OF SYSTEMS:  [X]  denotes positive finding, [ ]  denotes negative finding Cardiac  Comments:  Chest pain or chest pressure: x   Shortness of breath upon exertion: x   Short of breath when lying flat:    Irregular heart rhythm:        Vascular    Pain in calf, thigh, or hip brought on by ambulation:    Pain in feet at night that wakes you up from your sleep:  x   Blood clot in your veins:    Leg swelling:         Pulmonary    Oxygen at home:    Productive cough:     Wheezing:         Neurologic    Sudden weakness in arms or legs:  x   Sudden numbness in arms or legs:  x   Sudden onset of difficulty speaking or slurred speech:    Temporary loss of vision in one eye:     Problems with dizziness:  x       Gastrointestinal    Blood in stool:     Vomited blood:         Genitourinary    Burning when urinating:     Blood in urine:        Psychiatric    Major depression:         Hematologic    Bleeding problems:    Problems with blood clotting too easily:        Skin    Rashes or ulcers:        Constitutional    Fever or chills:     PHYSICAL EXAM:   Vitals:   05/17/19 1300  BP: (!) 150/78  Pulse: 68  Resp: 20  Temp: 98.2 F (36.8 C)  SpO2: 98%  Weight: 143 lb (64.9 kg)  Height: 5' 7"  (1.702 m)    GENERAL: The patient is a well-nourished male, in no acute distress. The vital signs are documented above. CARDIAC: There is a regular rate and rhythm.  VASCULAR: I do not detect carotid bruits. He has palpable femoral, popliteal, and pedal pulses bilaterally. He has no significant lower extremity swelling. PULMONARY: There is good air exchange bilaterally without wheezing or rales. ABDOMEN: Soft and non-tender with normal pitched bowel sounds.  His aorta is easily palpable  as he is thin.MUSCULOSKELETAL: There are no major deformities or cyanosis. NEUROLOGIC: No focal weakness or paresthesias are detected. SKIN: There are no ulcers or rashes noted. PSYCHIATRIC: The patient has a normal affect.  DATA:    ARTERIAL DOPPLER STUDY: I have independently interpreted his arterial Doppler study today.  On the right side there is a triphasic dorsalis pedis and posterior tibial signal.  ABIs 100%.  Toe pressure is 138 mmHg.  On the left side there is a triphasic dorsalis pedis and posterior tibial signal.  ABI is 100%.  Toe pressure is 141 mmHg.  LABS: I reviewed his labs from 01/19/2019.  LDL cholesterol 68 total cholesterol 167.  GFR 94.  Creatinine 0.81.  MR LUMBAR SPINE: I reviewed the MRI of the lumbar spine that was done on 05/07/2019.  The patient has had prior posterior fusion of L3-L5.  There is no evidence of residual foraminal or canal stenosis at these levels.  There was adjacent segmental disease at L2-L3 with mild canal stenosis and mild to moderate bilateral foraminal stenoses.  I did look  at his MRI and he does not appear to have an abdominal aortic aneurysm by MRI.

## 2019-05-18 ENCOUNTER — Telehealth: Payer: Self-pay | Admitting: Gastroenterology

## 2019-05-18 NOTE — Telephone Encounter (Signed)
Pt reported that he is having a UC flare up and requested to be prescribed hyocyamine.  He stated that prescription was originally prescribed by GI MD in [place].

## 2019-05-18 NOTE — Telephone Encounter (Signed)
Please advise if Hyoscyamine 0.344m BID is ok to prescribe?

## 2019-05-19 ENCOUNTER — Telehealth: Payer: Self-pay | Admitting: Internal Medicine

## 2019-05-19 ENCOUNTER — Other Ambulatory Visit: Payer: Self-pay | Admitting: Internal Medicine

## 2019-05-19 DIAGNOSIS — G8929 Other chronic pain: Secondary | ICD-10-CM

## 2019-05-19 DIAGNOSIS — G894 Chronic pain syndrome: Secondary | ICD-10-CM

## 2019-05-19 DIAGNOSIS — Z9889 Other specified postprocedural states: Secondary | ICD-10-CM

## 2019-05-19 DIAGNOSIS — M5136 Other intervertebral disc degeneration, lumbar region: Secondary | ICD-10-CM

## 2019-05-19 MED ORDER — HYOSCYAMINE SULFATE ER 0.375 MG PO TB12: 0.3750 mg | ORAL_TABLET | Freq: Two times a day (BID) | ORAL | 2 refills | Status: DC

## 2019-05-19 MED ORDER — CYCLOBENZAPRINE HCL 5 MG PO TABS: 5.0000 mg | ORAL_TABLET | Freq: Every evening | ORAL | 2 refills | Status: DC | PRN

## 2019-05-19 NOTE — Telephone Encounter (Signed)
Rx for Hyoscyamine sent to pharmacy. Pt informed. Pt is scheduled for Endo/Colon [place] 05/30/2019.

## 2019-05-19 NOTE — Telephone Encounter (Signed)
OK for Hyocyamine as persisting.   Is he scheduled for follow up +/- Procedures yet? Thank you. GM

## 2019-05-19 NOTE — Telephone Encounter (Signed)
Pt states that he needs a muscle relaxer-please call back

## 2019-05-19 NOTE — Telephone Encounter (Signed)
Patient wants to have some muscle relaxer rx sent in due to his chronic back pain. Dr Holley Raring told patient to call Dr Olivia Mackie. Due to Patient using medical mariajuana. Please advise

## 2019-05-23 ENCOUNTER — Other Ambulatory Visit: Payer: Self-pay | Admitting: Internal Medicine

## 2019-05-23 ENCOUNTER — Telehealth: Payer: Self-pay | Admitting: *Deleted

## 2019-05-23 ENCOUNTER — Ambulatory Visit (AMBULATORY_SURGERY_CENTER): Payer: Self-pay | Admitting: *Deleted

## 2019-05-23 ENCOUNTER — Other Ambulatory Visit: Payer: Self-pay

## 2019-05-23 VITALS — Temp 96.9°F | Ht 67.0 in | Wt 140.0 lb

## 2019-05-23 DIAGNOSIS — Z01818 Encounter for other preprocedural examination: Secondary | ICD-10-CM

## 2019-05-23 DIAGNOSIS — K51919 Ulcerative colitis, unspecified with unspecified complications: Secondary | ICD-10-CM

## 2019-05-23 DIAGNOSIS — I739 Peripheral vascular disease, unspecified: Secondary | ICD-10-CM

## 2019-05-23 DIAGNOSIS — R109 Unspecified abdominal pain: Secondary | ICD-10-CM

## 2019-05-23 NOTE — Telephone Encounter (Signed)
Let pt know that we are waiting to hear from Dr. Tammi Klippel and will call him as soon as he responds

## 2019-05-23 NOTE — Telephone Encounter (Signed)
CALLED PATIENT TO REMIND OF SIM APPT. FOR 05-26-19- ARRIVAL TIME- 9:45 AM @ [place] AND HIS MRI - ARRIVAL TIME- 4:30 PM @ [facility], MRI TO BEGIN @ 5 PM, SPOKE WITH PATIENT AND HE IS AWARE OF THESE APPTS.

## 2019-05-23 NOTE — Progress Notes (Signed)
Pt is not taking Plavix at all at this time.   Pt has Golytely at home- instructions for this given  Pt started prostate cancer treatment today- he had 3 seeds implanted and will be starting radiation next week.  I sent a TE to Dr. Rush Landmark so he is aware and to make sure ok to proceed.  Pt is aware that care partner will wait in the car during procedure; if they feel like they will be too hot or cold to wait in the car; they may wait in the 4 th floor lobby. Patient is aware to bring only one care partner. We want them to wear a mask (we do not have any that we can provide them), practice social distancing, and we will check their temperatures when they get here.  I did remind the patient that their care partner needs to stay in the parking lot the entire time and have a cell phone available, we will call them when the pt is ready for discharge. Patient will wear mask into building.  No egg or soy allergy  No home oxygen use or problems with anesthesia  No medications for weight loss taken  emmi information given  covid test 05-26-19 at 840

## 2019-05-23 NOTE — Telephone Encounter (Signed)
Dr. .Rush Landmark,  This pt is scheduled for an EGD and colonoscopy on 05-30-19.  He is here at [facility] today and had 3 prostate seeds inserted this am for treatment of prostate cancer.  Radiation starts on 05-30-19.    He is quite uncomfortable today.  Is he ok to proceed with double procedure as scheduled?  Please advise  Cyril Mourning

## 2019-05-23 NOTE — Telephone Encounter (Signed)
Dr. Tammi Klippel, I hope that you are doing well. Our mutual patient is scheduled for his first radiation treatment on the same day as his EGD/Colonoscopy to make sure he is stable from his prior diagnosis of IBD (has not been evaluated in years). I would think, it would be best for [place] to proceed with his EGD/Colonoscopy first before your treatment, but didn't know if you could begin his treatment 1-2 days after, since he is planned for 5.5 weeks anyway. Do you think that is OK? Appreciate the help and thought for our mutual patient. Patty and Cyril Mourning please let the patient know that we are reaching out to his Radiation Oncologist to ask this question. Thanks. GM

## 2019-05-24 ENCOUNTER — Encounter: Payer: Self-pay | Admitting: *Deleted

## 2019-05-24 ENCOUNTER — Telehealth: Payer: Self-pay | Admitting: *Deleted

## 2019-05-24 NOTE — Telephone Encounter (Signed)
Called patient to inform that sim and MRI has been moved to 06-02-19- arrival time- 9:45 am @ [facility], and his MRI has been moved to - arrival time- 4:30 pm @ [facility], spoke with patient and he is aware of these changes and is good with them

## 2019-05-24 NOTE — Telephone Encounter (Signed)
Thanks Dr. Tammi Klippel. Kristen/Patty, let's plan for our procedure and let him know that they will push his radiation out a few days.  We want to make his EGD/Colonscopy and biopsies as easy as we can and not have any increased risks if he were to initiate Radiation. GM

## 2019-05-24 NOTE — Telephone Encounter (Signed)
Spoke with pt and made him aware that we will proceed with his procedures as scheduled.

## 2019-05-26 ENCOUNTER — Ambulatory Visit (INDEPENDENT_AMBULATORY_CARE_PROVIDER_SITE_OTHER): Payer: Medicare Other

## 2019-05-26 ENCOUNTER — Ambulatory Visit (HOSPITAL_COMMUNITY): Payer: Medicare Other

## 2019-05-26 ENCOUNTER — Ambulatory Visit: Payer: Medicare Other | Admitting: Radiation Oncology

## 2019-05-26 DIAGNOSIS — Z1159 Encounter for screening for other viral diseases: Secondary | ICD-10-CM

## 2019-05-27 LAB — SARS CORONAVIRUS 2 (TAT 6-24 HRS): SARS Coronavirus 2: NEGATIVE

## 2019-05-30 ENCOUNTER — Ambulatory Visit (AMBULATORY_SURGERY_CENTER): Payer: Medicare Other | Admitting: Gastroenterology

## 2019-05-30 ENCOUNTER — Other Ambulatory Visit: Payer: Self-pay

## 2019-05-30 ENCOUNTER — Encounter: Payer: Self-pay | Admitting: Gastroenterology

## 2019-05-30 VITALS — BP 141/77 | HR 75 | Temp 97.8°F | Resp 9 | Ht 67.0 in | Wt 140.0 lb

## 2019-05-30 DIAGNOSIS — K621 Rectal polyp: Secondary | ICD-10-CM | POA: Diagnosis not present

## 2019-05-30 DIAGNOSIS — K2950 Unspecified chronic gastritis without bleeding: Secondary | ICD-10-CM | POA: Diagnosis not present

## 2019-05-30 DIAGNOSIS — K635 Polyp of colon: Secondary | ICD-10-CM | POA: Diagnosis not present

## 2019-05-30 DIAGNOSIS — K3189 Other diseases of stomach and duodenum: Secondary | ICD-10-CM

## 2019-05-30 DIAGNOSIS — Z86010 Personal history of colonic polyps: Secondary | ICD-10-CM

## 2019-05-30 DIAGNOSIS — K5180 Other ulcerative colitis without complications: Secondary | ICD-10-CM

## 2019-05-30 DIAGNOSIS — D128 Benign neoplasm of rectum: Secondary | ICD-10-CM

## 2019-05-30 DIAGNOSIS — R1084 Generalized abdominal pain: Secondary | ICD-10-CM

## 2019-05-30 DIAGNOSIS — D120 Benign neoplasm of cecum: Secondary | ICD-10-CM

## 2019-05-30 DIAGNOSIS — K51919 Ulcerative colitis, unspecified with unspecified complications: Secondary | ICD-10-CM

## 2019-05-30 DIAGNOSIS — D129 Benign neoplasm of anus and anal canal: Secondary | ICD-10-CM

## 2019-05-30 MED ORDER — SODIUM CHLORIDE 0.9 % IV SOLN: 500.0000 mL | Freq: Once | INTRAVENOUS | Status: DC

## 2019-05-30 MED ORDER — OMEPRAZOLE 40 MG PO CPDR: 40.0000 mg | DELAYED_RELEASE_CAPSULE | Freq: Every day | ORAL | 3 refills | Status: DC

## 2019-05-30 NOTE — Op Note (Signed)
[place] Patient Name: Erik Nicholson Procedure Date: 05/30/2019 9:52 AM MRN: [id number] Endoscopist: Justice Britain , MD Age: 69 Referring MD:  Date of Birth: 1950-08-19 Gender: Male Account #: [id number] Procedure:                Upper GI endoscopy Indications:              Generalized abdominal pain, Intestinal metaplasia Medicines:                Monitored Anesthesia Care Procedure:                Pre-Anesthesia Assessment:                           - Prior to the procedure, a History and Physical                            was performed, and patient medications and                            allergies were reviewed. The patient's tolerance of                            previous anesthesia was also reviewed. The risks                            and benefits of the procedure and the sedation                            options and risks were discussed with the patient.                            All questions were answered, and informed consent                            was obtained. Prior Anticoagulants: The patient has                            taken no previous anticoagulant or antiplatelet                            agents. ASA Grade Assessment: III - A patient with                            severe systemic disease. After reviewing the risks                            and benefits, the patient was deemed in                            satisfactory condition to undergo the procedure.                           After obtaining informed consent, the endoscope was  passed under direct vision. Throughout the                            procedure, the patient's blood pressure, pulse, and                            oxygen saturations were monitored continuously. The                            Endoscope was introduced through the mouth, and                            advanced to the second part of duodenum. The upper                            GI  endoscopy was accomplished without difficulty.                            The patient tolerated the procedure. Scope In: Scope Out: Findings:                 White nummular lesions were noted in the mid                            esophagus. Biopsies were taken with a cold forceps                            for histology to rule out Candida.                           No other gross lesions were noted in the proximal                            esophagus and in the distal esophagus.                           The Z-line was regular and was found 41 cm from the                            incisors.                           Diffuse severe inflammation characterized by                            adherent blood, congestion (edema), erosions,                            friability and granularity was found in the gastric                            antrum and in the prepyloric region of the stomach.                           A  submucosal, non-circumferential nodule with no                            bleeding and no stigmata of recent bleeding was                            found in the gastric antrum. Tunneled biopsies were                            taken with a cold forceps for histology.                           No other gross lesions were noted in the entire                            examined stomach. Biopsies were taken with a cold                            forceps for histology and Helicobacter pylori                            testing.                           No gross lesions were noted in the duodenal bulb,                            in the first portion of the duodenum and in the                            second portion of the duodenum. Biopsies for                            histology were taken with a cold forceps for                            evaluation of celiac disease. Complications:            No immediate complications. Estimated Blood Loss:     Estimated blood loss was  minimal. Impression:               - White nummular lesions in esophageal mucosa in                            midesophagus. Biopsied. No other gross lesions in                            esophagus. Z-line regular, 41 cm from the incisors.                           - Gastritis. Biopsied for HP.                           - Gastric submucosal nodule in the gastric antrum.  Tunnel Biopsied.                           - No gross lesions in the duodenal bulb, in the                            first portion of the duodenum and in the second                            portion of the duodenum. Biopsied. Recommendation:           - Proceed to scheduled colonoscopy.                           - Await pathology results.                           - Start Omeprazole 40 mg daily for the next few                            months.                           - Will allow healing of the stomach lining prior to                            gastric mapping, and patient will also need a                            non-emergent EUS to evaluate the submucosal nodule,                            thus will plan repeat procedure for EGD/EUS in                            4-month time to heal the areas of gastritis in                            stomach and also ensure no H. pylori infection.                           - The findings and recommendations were discussed                            with the patient. GJustice Britain MD 05/30/2019 10:47:48 AM

## 2019-05-30 NOTE — Progress Notes (Signed)
Called to room to assist during endoscopic procedure.  Patient ID and intended procedure confirmed with present staff. Received instructions for my participation in the procedure from the performing physician.  

## 2019-05-30 NOTE — Progress Notes (Signed)
Pt tolerated well. VSS. Awake and to recovery. 

## 2019-05-30 NOTE — Patient Instructions (Signed)
HANDOUTS PROVIDED ON: GASTRITIS, HIGH FIBER DIET, POLYPS, & HEMORRHOIDS  The polyps removed/biopsies taken today have been sent for pathology.  The results can take 1-3 weeks to receive.  When your next colonoscopy should occur will be based on the pathology results.    You may resume your previous diet and medication schedule.  Thank you for allowing [place] to care for you today!!!   YOU HAD AN ENDOSCOPIC PROCEDURE TODAY AT [facility]:   Refer to the procedure report that was given to you for any specific questions about what was found during the examination.  If the procedure report does not answer your questions, please call your gastroenterologist to clarify.  If you requested that your care partner not be given the details of your procedure findings, then the procedure report has been included in a sealed envelope for you to review at your convenience later.  YOU SHOULD EXPECT: Some feelings of bloating in the abdomen. Passage of more gas than usual.  Walking can help get rid of the air that was put into your GI tract during the procedure and reduce the bloating. If you had a lower endoscopy (such as a colonoscopy or flexible sigmoidoscopy) you may notice spotting of blood in your stool or on the toilet paper. If you underwent a bowel prep for your procedure, you may not have a normal bowel movement for a few days.  Please Note:  You might notice some irritation and congestion in your nose or some drainage.  This is from the oxygen used during your procedure.  There is no need for concern and it should clear up in a day or so.  SYMPTOMS TO REPORT IMMEDIATELY:   Following lower endoscopy (colonoscopy or flexible sigmoidoscopy):  Excessive amounts of blood in the stool  Significant tenderness or worsening of abdominal pains  Swelling of the abdomen that is new, acute  Fever of 100F or higher   Following upper endoscopy (EGD)  Vomiting of blood or coffee ground  material  New chest pain or pain under the shoulder blades  Painful or persistently difficult swallowing  New shortness of breath  Fever of 100F or higher  Black, tarry-looking stools  For urgent or emergent issues, a gastroenterologist can be reached at any hour by calling [phone].   DIET:  We do recommend a small meal at first, but then you may proceed to your regular diet.  Drink plenty of fluids but you should avoid alcoholic beverages for 24 hours.  ACTIVITY:  You should plan to take it easy for the rest of today and you should NOT DRIVE or use heavy machinery until tomorrow (because of the sedation medicines used during the test).    FOLLOW UP: Our staff will call the number listed on your records 48-72 hours following your procedure to check on you and address any questions or concerns that you may have regarding the information given to you following your procedure. If we do not reach you, we will leave a message.  We will attempt to reach you two times.  During this call, we will ask if you have developed any symptoms of COVID 19. If you develop any symptoms (ie: fever, flu-like symptoms, shortness of breath, cough etc.) before then, please call [phone].  If you test positive for Covid 19 in the 2 weeks post procedure, please call and report this information to [place].    If any biopsies were taken you will be contacted by phone  or by letter within the next 1-3 weeks.  Please call us at [phone] if you have not heard about the biopsies in 3 weeks.    SIGNATURES/CONFIDENTIALITY: You and/or your care partner have signed paperwork which will be entered into your electronic medical record.  These signatures attest to the fact that that the information above on your After Visit Summary has been reviewed and is understood.  Full responsibility of the confidentiality of this discharge information lies with you and/or your care-partner.

## 2019-05-30 NOTE — Progress Notes (Signed)
Pt's states no medical or surgical changes since previsit or office visit. 

## 2019-05-30 NOTE — Op Note (Signed)
[place] Patient Name: Erik Nicholson Procedure Date: 05/30/2019 9:49 AM MRN: [id number] Endoscopist: Justice Britain , MD Age: 69 Referring MD:  Date of Birth: 07/08/1950 Gender: Male Account #: [id number] Procedure:                Colonoscopy Indications:              High risk colon cancer surveillance: Ulcerative                            pancolitis of 8 (or more) years duration Medicines:                Monitored Anesthesia Care Procedure:                Pre-Anesthesia Assessment:                           - Prior to the procedure, a History and Physical                            was performed, and patient medications and                            allergies were reviewed. The patient's tolerance of                            previous anesthesia was also reviewed. The risks                            and benefits of the procedure and the sedation                            options and risks were discussed with the patient.                            All questions were answered, and informed consent                            was obtained. Prior Anticoagulants: The patient has                            taken no previous anticoagulant or antiplatelet                            agents. ASA Grade Assessment: III - A patient with                            severe systemic disease. After reviewing the risks                            and benefits, the patient was deemed in                            satisfactory condition to undergo the procedure.  After obtaining informed consent, the colonoscope                            was passed under direct vision. Throughout the                            procedure, the patient's blood pressure, pulse, and                            oxygen saturations were monitored continuously. The                            Colonoscope was introduced through the anus and                            advanced to the the  cecum, identified by                            appendiceal orifice and ileocecal valve. The                            colonoscopy was performed without difficulty. The                            patient tolerated the procedure. The quality of the                            bowel preparation was adequate. The ileocecal                            valve, appendiceal orifice, and rectum were                            photographed. Scope In: 10:17:26 AM Scope Out: 10:37:48 AM Scope Withdrawal Time: 0 hours 16 minutes 27 seconds  Total Procedure Duration: 0 hours 20 minutes 22 seconds  Findings:                 The digital rectal exam findings include                            hemorrhoids and prostate nodule.                           Five sessile polyps were found in the rectum (2)                            and cecum (3). The polyps were 2 to 4 mm in size.                            These polyps were removed with a cold snare.                            Resection and retrieval were complete.  Normal mucosa was found in the entire colon and                            rectum. Biopsies were taken with a cold forceps for                            histology from the right colon for IBD                            surveillance. Biopsies were taken with a cold                            forceps for histology from the left colon for IBD                            surveillance. Biopsies were taken with a cold                            forceps for histology from the rectum.                           Non-bleeding non-thrombosed internal hemorrhoids                            were found during retroflexion, during perianal                            exam and during digital exam. The hemorrhoids were                            Grade II (internal hemorrhoids that prolapse but                            reduce spontaneously). Complications:            No immediate  complications. Estimated Blood Loss:     Estimated blood loss was minimal. Impression:               - Hemorrhoids and a prostate nodule found on                            digital rectal exam.                           - Five 2 to 4 mm polyps in the rectum and in the                            cecum, removed with a cold snare. Resected and                            retrieved.                           - Normal mucosa in the entire examined colon.  Biopsied for IBD surveillance.                           - Non-bleeding non-thrombosed internal hemorrhoids. Recommendation:           - The patient will be observed post-procedure,                            until all discharge criteria are met.                           - Discharge patient to home.                           - Patient has a contact number available for                            emergencies. The signs and symptoms of potential                            delayed complications were discussed with the                            patient. Return to normal activities tomorrow.                            Written discharge instructions were provided to the                            patient.                           - High fiber diet.                           - Continue present medications.                           - Await pathology results.                           - Repeat colonoscopy in 1 year for surveillance.                           - The findings and recommendations were discussed                            with the patient. Justice Britain, MD 05/30/2019 10:52:09 AM

## 2019-06-01 ENCOUNTER — Telehealth: Payer: Self-pay | Admitting: *Deleted

## 2019-06-01 ENCOUNTER — Telehealth: Payer: Self-pay

## 2019-06-01 NOTE — Telephone Encounter (Signed)
CALLED PATIENT TO REMIND OF SIM APPT. FOR 06-02-19 - ARRIVAL TIME- 9:45 AM @ [place] AND HIS MRI FOR 06-11-19- ARRIVAL TIME- 4:30 PM @ [facility], SPOKE WITH PATIENT AND HE IS AWARE OF THESE APPTS.

## 2019-06-01 NOTE — Telephone Encounter (Signed)
  Follow up Call-  Call back number 05/30/2019  Post procedure Call Back phone  # [phone]  Permission to leave phone message Yes  Some recent data might be hidden     Patient questions:  Do you have a fever, pain , or abdominal swelling? No. Pain Score  0 *  Have you tolerated food without any problems? Yes.    Have you been able to return to your normal activities? Yes.    Do you have any questions about your discharge instructions: Diet   No. Medications  No. Follow up visit  No.  Do you have questions or concerns about your Care? No.  Actions: * If pain score is 4 or above: No action needed, pain <4. 1. Have you developed a fever since your procedure? no  2.   Have you had an respiratory symptoms (SOB or cough) since your procedure? no  3.   Have you tested positive for COVID 19 since your procedure no  4.   Have you had any family members/close contacts diagnosed with the COVID 19 since your procedure?  no   If yes to any of these questions please route to Joylene John, RN and Alphonsa Gin, Therapist, sports.

## 2019-06-02 ENCOUNTER — Ambulatory Visit (HOSPITAL_COMMUNITY)
Admission: RE | Admit: 2019-06-02 | Discharge: 2019-06-02 | Disposition: A | Discharge status: Home / Self Care | Payer: Medicare Other | Source: Ambulatory Visit | Attending: Urology | Admitting: Urology

## 2019-06-02 ENCOUNTER — Other Ambulatory Visit: Payer: Self-pay

## 2019-06-02 ENCOUNTER — Ambulatory Visit
Admission: RE | Admit: 2019-06-02 | Discharge: 2019-06-02 | Disposition: A | Discharge status: Home / Self Care | Payer: Medicare Other | Source: Ambulatory Visit | Attending: Radiation Oncology | Admitting: Radiation Oncology

## 2019-06-02 DIAGNOSIS — C61 Malignant neoplasm of prostate: Secondary | ICD-10-CM | POA: Insufficient documentation

## 2019-06-02 DIAGNOSIS — Z510 Encounter for antineoplastic radiation therapy: Secondary | ICD-10-CM | POA: Insufficient documentation

## 2019-06-03 ENCOUNTER — Encounter: Payer: Self-pay | Admitting: Gastroenterology

## 2019-06-04 ENCOUNTER — Other Ambulatory Visit: Payer: Self-pay | Admitting: Student in an Organized Health Care Education/Training Program

## 2019-06-04 NOTE — Progress Notes (Signed)
  Radiation Oncology         [phone] ________________________________  Name: Erik Nicholson MRN: [id number]  Date: 06/02/2019  DOB: Jul 11, 1950  SIMULATION AND TREATMENT PLANNING NOTE    ICD-10-CM   1. Prostate cancer ([place])  C61     DIAGNOSIS:  69 y.o. gentleman with Stage T1c adenocarcinoma of the prostate with Gleason score of 4+3, and PSA of 6.93.  NARRATIVE:  The patient was brought to the [facility].  Identity was confirmed.  All relevant records and images related to the planned course of therapy were reviewed.  The patient freely provided informed written consent to proceed with treatment after reviewing the details related to the planned course of therapy. The consent form was witnessed and verified by the simulation staff.  Then, the patient was set-up in a stable reproducible supine position for radiation therapy.  A vacuum lock pillow device was custom fabricated to position his legs in a reproducible immobilized position.  Then, I performed a urethrogram under sterile conditions to identify the prostatic apex.  CT images were obtained.  Surface markings were placed.  The CT images were loaded into the planning software.  Then the prostate target and avoidance structures including the rectum, bladder, bowel and hips were contoured.  Treatment planning then occurred.  The radiation prescription was entered and confirmed.  A total of one complex treatment devices was fabricated. I have requested : Intensity Modulated Radiotherapy (IMRT) is medically necessary for this case for the following reason:  Rectal sparing.Marland Kitchen  PLAN:  The patient will receive 70 Gy in 28 fractions.  ________________________________  Sheral Apley Tammi Klippel, M.D.

## 2019-06-05 DIAGNOSIS — C61 Malignant neoplasm of prostate: Secondary | ICD-10-CM | POA: Diagnosis present

## 2019-06-05 DIAGNOSIS — Z510 Encounter for antineoplastic radiation therapy: Secondary | ICD-10-CM | POA: Diagnosis present

## 2019-06-07 ENCOUNTER — Telehealth: Payer: Self-pay | Admitting: Internal Medicine

## 2019-06-07 NOTE — Telephone Encounter (Signed)

## 2019-06-08 ENCOUNTER — Telehealth (INDEPENDENT_AMBULATORY_CARE_PROVIDER_SITE_OTHER): Payer: Medicare Other | Admitting: Internal Medicine

## 2019-06-08 ENCOUNTER — Encounter: Payer: Self-pay | Admitting: Internal Medicine

## 2019-06-08 ENCOUNTER — Other Ambulatory Visit: Payer: Self-pay | Admitting: Gastroenterology

## 2019-06-08 ENCOUNTER — Other Ambulatory Visit: Payer: Self-pay

## 2019-06-08 VITALS — BP 126/83 | HR 52 | Ht 67.0 in | Wt 140.0 lb

## 2019-06-08 DIAGNOSIS — E785 Hyperlipidemia, unspecified: Secondary | ICD-10-CM

## 2019-06-08 DIAGNOSIS — I10 Essential (primary) hypertension: Secondary | ICD-10-CM | POA: Diagnosis not present

## 2019-06-08 DIAGNOSIS — I739 Peripheral vascular disease, unspecified: Secondary | ICD-10-CM

## 2019-06-08 DIAGNOSIS — I25118 Atherosclerotic heart disease of native coronary artery with other forms of angina pectoris: Secondary | ICD-10-CM | POA: Diagnosis not present

## 2019-06-08 MED ORDER — CLOPIDOGREL BISULFATE 75 MG PO TABS: 75.0000 mg | ORAL_TABLET | Freq: Every day | ORAL | Status: DC

## 2019-06-08 NOTE — Patient Instructions (Signed)
Please make sure you are taking Clopidogrel 75 mg (1 tablet) by mouth every day.  Medication Instructions:  Your physician recommends that you continue on your current medications as directed. Please refer to the Current Medication list given to you today.  *If you need a refill on your cardiac medications before your next appointment, please call your pharmacy*  Lab Work: none If you have labs (blood work) drawn today and your tests are completely normal, you will receive your results only by: Marland Kitchen MyChart Message (if you have MyChart) OR . A paper copy in the mail If you have any lab test that is abnormal or we need to change your treatment, we will call you to review the results.  Testing/Procedures: none  Follow-Up: At [facility], you and your health needs are our priority.  As part of our continuing mission to provide you with exceptional heart care, we have created designated Provider Care Teams.  These Care Teams include your primary Cardiologist (physician) and Advanced Practice Providers (APPs -  Physician Assistants and Nurse Practitioners) who all work together to provide you with the care you need, when you need it.  Your next appointment:   3 month(s)  The format for your next appointment:   In Person  Provider:    You may see DR Harrell Gave END or one of the following Advanced Practice Providers on your designated Care Team:    Murray Hodgkins, NP  Christell Faith, PA-C  Marrianne Mood, PA-C

## 2019-06-08 NOTE — Progress Notes (Signed)
Virtual Visit via Video Note   This visit type was conducted due to national recommendations for restrictions regarding the COVID-19 Pandemic (e.g. social distancing) in an effort to limit this patient's exposure and mitigate transmission in our community.  Due to his co-morbid illnesses, this patient is at least at moderate risk for complications without adequate follow up.  This format is felt to be most appropriate for this patient at this time.  All issues noted in this document were discussed and addressed.  A limited physical exam was performed with this format.  Please refer to the patient's chart for his consent to telehealth for [facility]   Date:  06/08/2019   ID:  Erik Nicholson, DOB [birth year], MRN [id number]  Patient Location: Home Provider Location: Home  PCP:  [name], [name], MD  Cardiologist:  Nelva Bush, MD Electrophysiologist:  None   Evaluation Performed:  Follow-Up Visit  Chief Complaint: Follow-up coronary artery disease  History of Present Illness:    Erik Nicholson is a 69 y.o. male with history of coronary artery disease status post multiple PCI's, peripheral vascular disease status post bilateral lower extremity stenting, ulcerative colitis, hypertension, hyperlipidemia, hepatitis C, prostate cancer, and BPH.  I last saw him in November, at which time he reported continued chest tightness and shortness of breath with modest activity.  Preceding myocardial perfusion stress test showed no significant ischemia or scar.  Lower extremity arterial studies showed patent bilateral SFA stents.  We agreed to start isosorbide mononitrate 30 mg daily in place of as needed use of nitroglycerin patches.  Today, Erik Nicholson reports that he has been doing relatively well.  He reports a single episode of chest pain that he describes as indigestion that occurred while walking up a hill.  It resolved promptly with sublingual nitroglycerin.  He has not had any  further pain like this and was recently advised to begin taking omeprazole by another provider.  He has stable exertional dyspnea.  He is tolerating isosorbide mononitrate well.  He was recently diagnosed with prostate cancer and is scheduled to undergo radiation therapy beginning next week.  Seeds were placed last month, prompting discontinuation of clopidogrel.  He is back on clopidogrel but is not taking it on a daily basis.  The patient does not have symptoms concerning for COVID-19 infection (fever, chills, cough, or new shortness of breath).    Past Medical History:  Diagnosis Date  . Anemia   . Arthritis   . BPH (benign prostatic hyperplasia)   . CAD (coronary artery disease)    x 7 cardiac stents  . Chronic back pain    L4/5 with chronic right leg pain   . Hx of hepatitis C    treated with Harvoni in 2014  . Hyperlipidemia   . Hypertension   . Neuromuscular disorder ([place])    nerve damage in back  . Neuropathy   . Prostate cancer ([place])   . PVD (peripheral vascular disease) ([place])    2 stents right leg and 1 in left   . Ulcerative colitis ([place])    with diarrhea   Past Surgical History:  Procedure Laterality Date  . back surgery     x 2 lumbar last 09/2016 in [place]  . CARDIAC CATHETERIZATION    . CATARACT EXTRACTION, BILATERAL    . COLONOSCOPY     last 09/2018 in [place]  . HERNIA REPAIR     right   . PENILE PROSTHESIS IMPLANT    .  PERCUTANEOUS CORONARY STENT INTERVENTION (PCI-S)     x 7-8 heart  . PERIPHERAL ARTERIAL STENT GRAFT    . PROSTATE BIOPSY    . pvd     with stenting x 2 right leg below knee and 1 left thigh   . SHOULDER ARTHROSCOPY Bilateral      Current Meds  Medication Sig  . atorvastatin (LIPITOR) 20 MG tablet Take 1 tablet (20 mg total) by mouth daily at 6 PM.  . clopidogrel (PLAVIX) 75 MG tablet Take 75 mg by mouth every other day.  . cyclobenzaprine (FLEXERIL) 5 MG tablet Take 1-2 tablets (5-10 mg total) by mouth at bedtime as  needed for muscle spasms.  . famotidine (PEPCID) 40 MG tablet Take 1 tablet (40 mg total) by mouth 2 (two) times daily.  . ferrous sulfate 325 (65 FE) MG EC tablet Take 325 mg by mouth daily.  . hyoscyamine (LEVBID) 0.375 MG 12 hr tablet Take 1 tablet (0.375 mg total) by mouth 2 (two) times daily.  . isosorbide mononitrate (IMDUR) 30 MG 24 hr tablet Take 1 tablet (30 mg total) by mouth daily.  Marland Kitchen lidocaine (LIDODERM) 5 %   . mesalamine (LIALDA) 1.2 g EC tablet Take 2 tablets (2.4 g total) by mouth daily with breakfast.  . metoprolol succinate (TOPROL-XL) 50 MG 24 hr tablet Take 1 tablet (50 mg total) by mouth daily.  . Multiple Vitamin (MULTIVITAMIN) tablet Take 1 tablet by mouth daily. Stopped at this time  . nitroGLYCERIN (NITROSTAT) 0.4 MG SL tablet Place 1 tablet (0.4 mg total) under the tongue every 5 (five) minutes as needed for chest pain.  Marland Kitchen omeprazole (PRILOSEC) 40 MG capsule Take 1 capsule (40 mg total) by mouth daily.  . RESTASIS 0.05 % ophthalmic emulsion Place 1 drop into both eyes 2 (two) times daily.   Marland Kitchen telmisartan-hydrochlorothiazide (MICARDIS HCT) 40-12.5 MG tablet Take 1 tablet by mouth daily.     Allergies:   Cymbalta [duloxetine hcl]   Social History   Tobacco Use  . Smoking status: Former Smoker    Packs/day: 1.00    Years: 40.00    Pack years: 40.00    Types: Cigarettes    Quit date: 04/20/1998    Years since quitting: 21.1  . Smokeless tobacco: Never Used  . Tobacco comment: quit in 2000s   Substance Use Topics  . Alcohol use: Yes    Comment: occasional  . Drug use: Not Currently     Family Hx: The patient's family history includes Breast cancer in his mother; Diabetes in his sister; Heart disease in his mother; Stroke in his mother. There is no history of Colon cancer, Esophageal cancer, Inflammatory bowel disease, Liver disease, Pancreatic cancer, Rectal cancer, or Stomach cancer.  ROS:   Please see the history of present illness.   All other systems  reviewed and are negative.   Prior CV studies:   The following studies were reviewed today:  Cath/PCI:  RLE PTA (02/19/2014): Right mid and distal SFA laser atherectomy and PTA/stenting.  Runoff (02/01/2014): Severe right SFA stenosis with 2-vessel runoff.  Abdominal aortogran and runoff/PTA (04/26/2012): Right SFA stenting and left SFA PTA with DCB  LHC (02/25/2012): LMCA, LAD, and LCx "without any obtructive lesions). RCA with 40% mid vessel stenosis. Proximal and mid RCA stents patent. LVEF 60%  Abdominal aortogram and runoff (02/25/2012): 95% bilateral SFA stenoses.  CV Surgery:  None  EP Procedures and Devices:  None  Non-Invasive Evaluation(s):  Myocardial perfusion stress test (03/07/2019):  Normal study without ischemia or scar.  LVEF 56%.  TTE (03/02/2019): Normal LV size and wall thickness.  LVEF 60-65% with grade 1 diastolic dysfunction.  Normal RV size and function.  No significant valvular abnormality.  ABIs and bilateral lower extremity Doppler (03/02/2019): ABIs normal (1.1 bilaterally).  Bilateral SFA stents are patent with mild stenosis in the midsegment of the right SFA stent.  Abdominal aorta duplex (07/22/2016): No AAA. Maximal aortic diameter 2.8 cm.  TTE (07/22/2016): Normal LV size with mild LVH and grade 1 diastolic dysfunction. LVEF 65%. Trace aortic and mitral regurgitation.  Pharmacologic MPI (07/21/2016): Normal myocardial perfusion without ischemia or scar.  Renal artery duplex (02/19/2012): Normal without evidence of renal artery stenosis  Labs/Other Tests and Data Reviewed:    EKG:  No ECG reviewed.  Recent Labs: 01/19/2019: ALT 14; BUN 19; Creatinine, Ser 0.81; Hemoglobin 12.7; Platelets 209.0; Potassium 4.1; Sodium 140; TSH 0.51   Recent Lipid Panel Lab Results  Component Value Date/Time   CHOL 167 01/19/2019 08:45 AM   TRIG 74.0 01/19/2019 08:45 AM   HDL 83.30 01/19/2019 08:45 AM   CHOLHDL 2 01/19/2019 08:45 AM   LDLCALC 68  01/19/2019 08:45 AM    Wt Readings from Last 3 Encounters:  06/08/19 140 lb (63.5 kg)  05/30/19 140 lb (63.5 kg)  05/23/19 140 lb (63.5 kg)     Objective:    Vital Signs:  BP 126/83   Pulse (!) 52   Ht 5' 7"  (1.702 m)   Wt 140 lb (63.5 kg)   BMI 21.93 kg/m    VITAL SIGNS:  reviewed GEN:  no acute distress  ASSESSMENT & PLAN:    Coronary artery disease with stable angina: Erik Nicholson reports a single episode of exertional chest discomfort that was reminiscent of indigestion.  However, given his history of multiple stents, certainly this could reflect coronary insufficiency.  I think it is reasonable to continue his current antianginal regimen consisting of isosorbide mononitrate and metoprolol succinate, given relatively infrequent symptoms and a low risk myocardial perfusion stress test last fall.  I have asked him to begin taking clopidogrel 75 mg daily rather than a few days a week.  Hypertension: Blood pressure normal today.  No medication changes at this time.  Hyperlipidemia: LDL at goal on most recent check in 01/2019.  Continue atorvastatin 20 mg daily for now.  Peripheral vascular disease: No active symptoms at this time.  Vascular surgery did not wish to pursue further intervention or testing.  We will continue with secondary prevention.  COVID-19 Education: The signs and symptoms of COVID-19 were discussed with the patient and how to seek care for testing (follow up with PCP or arrange E-visit).  The importance of social distancing was discussed today.  Time:   Today, I have spent 15 minutes with the patient with telehealth technology discussing the above problems.     Medication Adjustments/Labs and Tests Ordered: Current medicines are reviewed at length with the patient today.  Concerns regarding medicines are outlined above.   Tests Ordered: None.  Medication Changes: Clopidogrel 75 mg daily.  Follow Up:  In Person in 3  month(s)  Signed, Nelva Bush, MD  06/08/2019 2:15 PM    [facility]

## 2019-06-12 ENCOUNTER — Ambulatory Visit
Admission: RE | Admit: 2019-06-12 | Discharge: 2019-06-12 | Disposition: A | Discharge status: Home / Self Care | Payer: Medicare Other | Source: Ambulatory Visit | Attending: Radiation Oncology | Admitting: Radiation Oncology

## 2019-06-12 ENCOUNTER — Encounter: Payer: Self-pay | Admitting: Medical Oncology

## 2019-06-12 ENCOUNTER — Other Ambulatory Visit: Payer: Self-pay

## 2019-06-12 DIAGNOSIS — Z510 Encounter for antineoplastic radiation therapy: Secondary | ICD-10-CM | POA: Diagnosis not present

## 2019-06-13 ENCOUNTER — Other Ambulatory Visit: Payer: Self-pay

## 2019-06-13 ENCOUNTER — Ambulatory Visit
Admission: RE | Admit: 2019-06-13 | Discharge: 2019-06-13 | Disposition: A | Discharge status: Home / Self Care | Payer: Medicare Other | Source: Ambulatory Visit | Attending: Radiation Oncology | Admitting: Radiation Oncology

## 2019-06-13 DIAGNOSIS — Z510 Encounter for antineoplastic radiation therapy: Secondary | ICD-10-CM | POA: Diagnosis not present

## 2019-06-14 ENCOUNTER — Other Ambulatory Visit: Payer: Self-pay

## 2019-06-14 ENCOUNTER — Ambulatory Visit
Admission: RE | Admit: 2019-06-14 | Discharge: 2019-06-14 | Disposition: A | Discharge status: Home / Self Care | Payer: Medicare Other | Source: Ambulatory Visit | Attending: Radiation Oncology | Admitting: Radiation Oncology

## 2019-06-14 DIAGNOSIS — Z510 Encounter for antineoplastic radiation therapy: Secondary | ICD-10-CM | POA: Diagnosis not present

## 2019-06-15 ENCOUNTER — Other Ambulatory Visit: Payer: Self-pay

## 2019-06-15 ENCOUNTER — Ambulatory Visit
Admission: RE | Admit: 2019-06-15 | Discharge: 2019-06-15 | Disposition: A | Discharge status: Home / Self Care | Payer: Medicare Other | Source: Ambulatory Visit | Attending: Radiation Oncology | Admitting: Radiation Oncology

## 2019-06-15 DIAGNOSIS — Z510 Encounter for antineoplastic radiation therapy: Secondary | ICD-10-CM | POA: Diagnosis not present

## 2019-06-16 ENCOUNTER — Other Ambulatory Visit: Payer: Self-pay

## 2019-06-16 ENCOUNTER — Ambulatory Visit
Admission: RE | Admit: 2019-06-16 | Discharge: 2019-06-16 | Disposition: A | Discharge status: Home / Self Care | Payer: Medicare Other | Source: Ambulatory Visit | Attending: Radiation Oncology | Admitting: Radiation Oncology

## 2019-06-16 DIAGNOSIS — Z510 Encounter for antineoplastic radiation therapy: Secondary | ICD-10-CM | POA: Diagnosis not present

## 2019-06-16 NOTE — Progress Notes (Signed)
.  e     Completed post sim education with patient today oriented patient to staff and routine of clinic provided patient with radiation therapy and you handbook then reviewed pertinent information educated patient reference potential side effects and management such as fatigue, diarrhea, and urinary bladder changes. Answered all patient questions to the best of my ability. Patient verbalized understanding.

## 2019-06-18 ENCOUNTER — Ambulatory Visit: Payer: Medicare Other | Attending: Internal Medicine

## 2019-06-18 DIAGNOSIS — Z23 Encounter for immunization: Secondary | ICD-10-CM | POA: Insufficient documentation

## 2019-06-18 NOTE — Progress Notes (Signed)
   Covid-19 Vaccination Clinic  Name:  Zyere Jiminez    MRN: [id number] DOB: 05-18-1950  06/18/2019  Mr. Rayansh Herbst was observed post Covid-19 immunization for 15 minutes without incidence. He was provided with Vaccine Information Sheet and instruction to access the V-Safe system.   Mr. Macedonio Scallon was instructed to call 911 with any severe reactions post vaccine: Marland Kitchen Difficulty breathing  . Swelling of your face and throat  . A fast heartbeat  . A bad rash all over your body  . Dizziness and weakness    Immunizations Administered    Name Date Dose VIS Date Route   Pfizer COVID-19 Vaccine 06/18/2019  1:32 PM 0.3 mL 03/31/2019 Intramuscular   Manufacturer: [place]   Lot: [id number]   [place]: [id number]

## 2019-06-19 ENCOUNTER — Ambulatory Visit
Admission: RE | Admit: 2019-06-19 | Discharge: 2019-06-19 | Disposition: A | Discharge status: Home / Self Care | Payer: Medicare Other | Source: Ambulatory Visit | Attending: Radiation Oncology | Admitting: Radiation Oncology

## 2019-06-19 ENCOUNTER — Other Ambulatory Visit: Payer: Self-pay

## 2019-06-19 DIAGNOSIS — C61 Malignant neoplasm of prostate: Secondary | ICD-10-CM | POA: Insufficient documentation

## 2019-06-19 DIAGNOSIS — Z510 Encounter for antineoplastic radiation therapy: Secondary | ICD-10-CM | POA: Insufficient documentation

## 2019-06-20 ENCOUNTER — Ambulatory Visit: Payer: Medicare Other

## 2019-06-21 ENCOUNTER — Ambulatory Visit
Admission: RE | Admit: 2019-06-21 | Discharge: 2019-06-21 | Disposition: A | Discharge status: Home / Self Care | Payer: Medicare Other | Source: Ambulatory Visit | Attending: Radiation Oncology | Admitting: Radiation Oncology

## 2019-06-21 ENCOUNTER — Other Ambulatory Visit: Payer: Self-pay

## 2019-06-21 DIAGNOSIS — Z510 Encounter for antineoplastic radiation therapy: Secondary | ICD-10-CM | POA: Diagnosis not present

## 2019-06-22 ENCOUNTER — Other Ambulatory Visit: Payer: Self-pay

## 2019-06-22 ENCOUNTER — Ambulatory Visit
Admission: RE | Admit: 2019-06-22 | Discharge: 2019-06-22 | Disposition: A | Discharge status: Home / Self Care | Payer: Medicare Other | Source: Ambulatory Visit | Attending: Radiation Oncology | Admitting: Radiation Oncology

## 2019-06-22 DIAGNOSIS — Z510 Encounter for antineoplastic radiation therapy: Secondary | ICD-10-CM | POA: Diagnosis not present

## 2019-06-23 ENCOUNTER — Ambulatory Visit
Admission: RE | Admit: 2019-06-23 | Discharge: 2019-06-23 | Disposition: A | Discharge status: Home / Self Care | Payer: Medicare Other | Source: Ambulatory Visit | Attending: Radiation Oncology | Admitting: Radiation Oncology

## 2019-06-23 ENCOUNTER — Other Ambulatory Visit: Payer: Self-pay

## 2019-06-23 DIAGNOSIS — Z510 Encounter for antineoplastic radiation therapy: Secondary | ICD-10-CM | POA: Diagnosis not present

## 2019-06-26 ENCOUNTER — Ambulatory Visit
Admission: RE | Admit: 2019-06-26 | Discharge: 2019-06-26 | Disposition: A | Discharge status: Home / Self Care | Payer: Medicare Other | Source: Ambulatory Visit | Attending: Radiation Oncology | Admitting: Radiation Oncology

## 2019-06-26 ENCOUNTER — Other Ambulatory Visit: Payer: Self-pay

## 2019-06-26 DIAGNOSIS — Z510 Encounter for antineoplastic radiation therapy: Secondary | ICD-10-CM | POA: Diagnosis not present

## 2019-06-27 ENCOUNTER — Ambulatory Visit
Admission: RE | Admit: 2019-06-27 | Discharge: 2019-06-27 | Disposition: A | Discharge status: Home / Self Care | Payer: Medicare Other | Source: Ambulatory Visit | Attending: Radiation Oncology | Admitting: Radiation Oncology

## 2019-06-27 ENCOUNTER — Other Ambulatory Visit: Payer: Self-pay

## 2019-06-27 DIAGNOSIS — Z510 Encounter for antineoplastic radiation therapy: Secondary | ICD-10-CM | POA: Diagnosis not present

## 2019-06-28 ENCOUNTER — Other Ambulatory Visit: Payer: Self-pay

## 2019-06-28 ENCOUNTER — Ambulatory Visit
Admission: RE | Admit: 2019-06-28 | Discharge: 2019-06-28 | Disposition: A | Discharge status: Home / Self Care | Payer: Medicare Other | Source: Ambulatory Visit | Attending: Radiation Oncology | Admitting: Radiation Oncology

## 2019-06-28 DIAGNOSIS — Z510 Encounter for antineoplastic radiation therapy: Secondary | ICD-10-CM | POA: Diagnosis not present

## 2019-06-29 ENCOUNTER — Ambulatory Visit
Admission: RE | Admit: 2019-06-29 | Discharge: 2019-06-29 | Disposition: A | Discharge status: Home / Self Care | Payer: Medicare Other | Source: Ambulatory Visit | Attending: Radiation Oncology | Admitting: Radiation Oncology

## 2019-06-29 ENCOUNTER — Other Ambulatory Visit: Payer: Self-pay

## 2019-06-29 DIAGNOSIS — Z510 Encounter for antineoplastic radiation therapy: Secondary | ICD-10-CM | POA: Diagnosis not present

## 2019-06-30 ENCOUNTER — Other Ambulatory Visit: Payer: Self-pay

## 2019-06-30 ENCOUNTER — Ambulatory Visit
Admission: RE | Admit: 2019-06-30 | Discharge: 2019-06-30 | Disposition: A | Discharge status: Home / Self Care | Payer: Medicare Other | Source: Ambulatory Visit | Attending: Radiation Oncology | Admitting: Radiation Oncology

## 2019-06-30 DIAGNOSIS — Z510 Encounter for antineoplastic radiation therapy: Secondary | ICD-10-CM | POA: Diagnosis not present

## 2019-07-03 ENCOUNTER — Ambulatory Visit
Admission: RE | Admit: 2019-07-03 | Discharge: 2019-07-03 | Disposition: A | Discharge status: Home / Self Care | Payer: Medicare Other | Source: Ambulatory Visit | Attending: Radiation Oncology | Admitting: Radiation Oncology

## 2019-07-03 ENCOUNTER — Other Ambulatory Visit: Payer: Self-pay

## 2019-07-03 DIAGNOSIS — Z510 Encounter for antineoplastic radiation therapy: Secondary | ICD-10-CM | POA: Diagnosis not present

## 2019-07-04 ENCOUNTER — Encounter: Payer: Self-pay | Admitting: Medical Oncology

## 2019-07-04 ENCOUNTER — Ambulatory Visit
Admission: RE | Admit: 2019-07-04 | Discharge: 2019-07-04 | Disposition: A | Discharge status: Home / Self Care | Payer: Medicare Other | Source: Ambulatory Visit | Attending: Radiation Oncology | Admitting: Radiation Oncology

## 2019-07-04 ENCOUNTER — Other Ambulatory Visit: Payer: Self-pay

## 2019-07-04 DIAGNOSIS — Z510 Encounter for antineoplastic radiation therapy: Secondary | ICD-10-CM | POA: Diagnosis not present

## 2019-07-05 ENCOUNTER — Other Ambulatory Visit: Payer: Self-pay

## 2019-07-05 ENCOUNTER — Ambulatory Visit
Admission: RE | Admit: 2019-07-05 | Discharge: 2019-07-05 | Disposition: A | Discharge status: Home / Self Care | Payer: Medicare Other | Source: Ambulatory Visit | Attending: Radiation Oncology | Admitting: Radiation Oncology

## 2019-07-05 DIAGNOSIS — Z510 Encounter for antineoplastic radiation therapy: Secondary | ICD-10-CM | POA: Diagnosis not present

## 2019-07-06 ENCOUNTER — Ambulatory Visit
Admission: RE | Admit: 2019-07-06 | Discharge: 2019-07-06 | Disposition: A | Discharge status: Home / Self Care | Payer: Medicare Other | Source: Ambulatory Visit | Attending: Radiation Oncology | Admitting: Radiation Oncology

## 2019-07-06 ENCOUNTER — Other Ambulatory Visit: Payer: Self-pay

## 2019-07-06 DIAGNOSIS — Z510 Encounter for antineoplastic radiation therapy: Secondary | ICD-10-CM | POA: Diagnosis not present

## 2019-07-07 ENCOUNTER — Other Ambulatory Visit: Payer: Self-pay

## 2019-07-07 ENCOUNTER — Ambulatory Visit
Admission: RE | Admit: 2019-07-07 | Discharge: 2019-07-07 | Disposition: A | Discharge status: Home / Self Care | Payer: Medicare Other | Source: Ambulatory Visit | Attending: Radiation Oncology | Admitting: Radiation Oncology

## 2019-07-07 DIAGNOSIS — Z510 Encounter for antineoplastic radiation therapy: Secondary | ICD-10-CM | POA: Diagnosis not present

## 2019-07-10 ENCOUNTER — Ambulatory Visit
Admission: RE | Admit: 2019-07-10 | Discharge: 2019-07-10 | Disposition: A | Discharge status: Home / Self Care | Payer: Medicare Other | Source: Ambulatory Visit | Attending: Radiation Oncology | Admitting: Radiation Oncology

## 2019-07-10 ENCOUNTER — Other Ambulatory Visit: Payer: Self-pay

## 2019-07-10 DIAGNOSIS — Z510 Encounter for antineoplastic radiation therapy: Secondary | ICD-10-CM | POA: Diagnosis not present

## 2019-07-11 ENCOUNTER — Ambulatory Visit
Admission: RE | Admit: 2019-07-11 | Discharge: 2019-07-11 | Disposition: A | Discharge status: Home / Self Care | Payer: Medicare Other | Source: Ambulatory Visit | Attending: Radiation Oncology | Admitting: Radiation Oncology

## 2019-07-11 ENCOUNTER — Other Ambulatory Visit: Payer: Self-pay

## 2019-07-11 DIAGNOSIS — Z510 Encounter for antineoplastic radiation therapy: Secondary | ICD-10-CM | POA: Diagnosis not present

## 2019-07-12 ENCOUNTER — Ambulatory Visit
Admission: RE | Admit: 2019-07-12 | Discharge: 2019-07-12 | Disposition: A | Discharge status: Home / Self Care | Payer: Medicare Other | Source: Ambulatory Visit | Attending: Radiation Oncology | Admitting: Radiation Oncology

## 2019-07-12 ENCOUNTER — Ambulatory Visit: Payer: Medicare Other | Attending: Internal Medicine

## 2019-07-12 ENCOUNTER — Other Ambulatory Visit: Payer: Self-pay

## 2019-07-12 DIAGNOSIS — Z23 Encounter for immunization: Secondary | ICD-10-CM

## 2019-07-12 DIAGNOSIS — Z510 Encounter for antineoplastic radiation therapy: Secondary | ICD-10-CM | POA: Diagnosis not present

## 2019-07-12 NOTE — Progress Notes (Signed)
   Covid-19 Vaccination Clinic  Name:  Javonne Dorko    MRN: [id number] DOB: Nov 11, 1950  07/12/2019  Mr. Atreus Hasz was observed post Covid-19 immunization for 15 minutes without incident. He was provided with Vaccine Information Sheet and instruction to access the V-Safe system.   Mr. Alexis Mizuno was instructed to call 911 with any severe reactions post vaccine: Marland Kitchen Difficulty breathing  . Swelling of face and throat  . A fast heartbeat  . A bad rash all over body  . Dizziness and weakness   Immunizations Administered    Name Date Dose VIS Date Route   Pfizer COVID-19 Vaccine 07/12/2019  1:04 PM 0.3 mL 03/31/2019 Intramuscular   Manufacturer: [place]   Lot: [id number]   [place]: [id number]

## 2019-07-13 ENCOUNTER — Other Ambulatory Visit: Payer: Self-pay | Admitting: Gastroenterology

## 2019-07-13 ENCOUNTER — Other Ambulatory Visit: Payer: Self-pay

## 2019-07-13 ENCOUNTER — Ambulatory Visit
Admission: RE | Admit: 2019-07-13 | Discharge: 2019-07-13 | Disposition: A | Discharge status: Home / Self Care | Payer: Medicare Other | Source: Ambulatory Visit | Attending: Radiation Oncology | Admitting: Radiation Oncology

## 2019-07-13 DIAGNOSIS — Z510 Encounter for antineoplastic radiation therapy: Secondary | ICD-10-CM | POA: Diagnosis not present

## 2019-07-14 ENCOUNTER — Other Ambulatory Visit: Payer: Self-pay

## 2019-07-14 ENCOUNTER — Ambulatory Visit
Admission: RE | Admit: 2019-07-14 | Discharge: 2019-07-14 | Disposition: A | Discharge status: Home / Self Care | Payer: Medicare Other | Source: Ambulatory Visit | Attending: Radiation Oncology | Admitting: Radiation Oncology

## 2019-07-14 DIAGNOSIS — Z510 Encounter for antineoplastic radiation therapy: Secondary | ICD-10-CM | POA: Diagnosis not present

## 2019-07-15 ENCOUNTER — Ambulatory Visit: Payer: Medicare Other

## 2019-07-17 ENCOUNTER — Ambulatory Visit
Admission: RE | Admit: 2019-07-17 | Discharge: 2019-07-17 | Disposition: A | Discharge status: Home / Self Care | Payer: Medicare Other | Source: Ambulatory Visit | Attending: Radiation Oncology | Admitting: Radiation Oncology

## 2019-07-17 ENCOUNTER — Other Ambulatory Visit: Payer: Self-pay

## 2019-07-17 DIAGNOSIS — Z510 Encounter for antineoplastic radiation therapy: Secondary | ICD-10-CM | POA: Diagnosis not present

## 2019-07-18 ENCOUNTER — Other Ambulatory Visit: Payer: Self-pay

## 2019-07-18 ENCOUNTER — Ambulatory Visit
Admission: RE | Admit: 2019-07-18 | Discharge: 2019-07-18 | Disposition: A | Discharge status: Home / Self Care | Payer: Medicare Other | Source: Ambulatory Visit | Attending: Radiation Oncology | Admitting: Radiation Oncology

## 2019-07-18 DIAGNOSIS — Z510 Encounter for antineoplastic radiation therapy: Secondary | ICD-10-CM | POA: Diagnosis not present

## 2019-07-19 ENCOUNTER — Ambulatory Visit
Admission: RE | Admit: 2019-07-19 | Discharge: 2019-07-19 | Disposition: A | Discharge status: Home / Self Care | Payer: Medicare Other | Source: Ambulatory Visit | Attending: Radiation Oncology | Admitting: Radiation Oncology

## 2019-07-19 ENCOUNTER — Ambulatory Visit: Payer: Medicare Other

## 2019-07-19 ENCOUNTER — Other Ambulatory Visit: Payer: Self-pay

## 2019-07-19 DIAGNOSIS — Z510 Encounter for antineoplastic radiation therapy: Secondary | ICD-10-CM | POA: Diagnosis not present

## 2019-07-20 ENCOUNTER — Encounter: Payer: Self-pay | Admitting: Radiation Oncology

## 2019-07-20 ENCOUNTER — Ambulatory Visit
Admission: RE | Admit: 2019-07-20 | Discharge: 2019-07-20 | Disposition: A | Discharge status: Home / Self Care | Payer: Medicare Other | Source: Ambulatory Visit | Attending: Radiation Oncology | Admitting: Radiation Oncology

## 2019-07-20 ENCOUNTER — Other Ambulatory Visit: Payer: Self-pay

## 2019-07-20 ENCOUNTER — Encounter: Payer: Self-pay | Admitting: Urology

## 2019-07-20 DIAGNOSIS — C61 Malignant neoplasm of prostate: Secondary | ICD-10-CM | POA: Diagnosis present

## 2019-07-20 DIAGNOSIS — Z510 Encounter for antineoplastic radiation therapy: Secondary | ICD-10-CM | POA: Insufficient documentation

## 2019-07-24 ENCOUNTER — Other Ambulatory Visit: Payer: Self-pay | Admitting: Gastroenterology

## 2019-08-04 ENCOUNTER — Encounter: Payer: Self-pay | Admitting: Internal Medicine

## 2019-08-04 ENCOUNTER — Other Ambulatory Visit: Payer: Self-pay

## 2019-08-04 ENCOUNTER — Ambulatory Visit (INDEPENDENT_AMBULATORY_CARE_PROVIDER_SITE_OTHER): Payer: Medicare Other | Admitting: Internal Medicine

## 2019-08-04 VITALS — BP 106/70 | HR 65 | Temp 97.3°F | Ht 67.0 in | Wt 154.4 lb

## 2019-08-04 DIAGNOSIS — K5190 Ulcerative colitis, unspecified, without complications: Secondary | ICD-10-CM

## 2019-08-04 DIAGNOSIS — C61 Malignant neoplasm of prostate: Secondary | ICD-10-CM | POA: Diagnosis not present

## 2019-08-04 DIAGNOSIS — G894 Chronic pain syndrome: Secondary | ICD-10-CM

## 2019-08-04 DIAGNOSIS — I10 Essential (primary) hypertension: Secondary | ICD-10-CM

## 2019-08-04 DIAGNOSIS — G8929 Other chronic pain: Secondary | ICD-10-CM

## 2019-08-04 DIAGNOSIS — M5136 Other intervertebral disc degeneration, lumbar region: Secondary | ICD-10-CM | POA: Diagnosis not present

## 2019-08-04 DIAGNOSIS — Z1329 Encounter for screening for other suspected endocrine disorder: Secondary | ICD-10-CM

## 2019-08-04 DIAGNOSIS — D649 Anemia, unspecified: Secondary | ICD-10-CM

## 2019-08-04 DIAGNOSIS — Z9889 Other specified postprocedural states: Secondary | ICD-10-CM

## 2019-08-04 DIAGNOSIS — I2510 Atherosclerotic heart disease of native coronary artery without angina pectoris: Secondary | ICD-10-CM

## 2019-08-04 DIAGNOSIS — M5416 Radiculopathy, lumbar region: Secondary | ICD-10-CM | POA: Diagnosis not present

## 2019-08-04 DIAGNOSIS — Z1389 Encounter for screening for other disorder: Secondary | ICD-10-CM

## 2019-08-04 MED ORDER — CYCLOBENZAPRINE HCL 5 MG PO TABS: 5.0000 mg | ORAL_TABLET | Freq: Every evening | ORAL | 11 refills | Status: DC | PRN

## 2019-08-04 MED ORDER — NITROGLYCERIN 0.4 MG SL SUBL: 0.4000 mg | SUBLINGUAL_TABLET | SUBLINGUAL | 3 refills | Status: DC | PRN

## 2019-08-04 NOTE — Patient Instructions (Addendum)
Low Back Sprain or Strain Rehab Ask your health care provider which exercises are safe for you. Do exercises exactly as told by your health care provider and adjust them as directed. It is normal to feel mild stretching, pulling, tightness, or discomfort as you do these exercises. Stop right away if you feel sudden pain or your pain gets worse. Do not begin these exercises until told by your health care provider. Stretching and range-of-motion exercises These exercises warm up your muscles and joints and improve the movement and flexibility of your back. These exercises also help to relieve pain, numbness, and tingling. Lumbar rotation  1. Lie on your back on a firm surface and bend your knees. 2. Straighten your arms out to your sides so each arm forms a 90-degree angle (right angle) with a side of your body. 3. Slowly move (rotate) both of your knees to one side of your body until you feel a stretch in your lower back (lumbar). Try not to let your shoulders lift off the floor. 4. Hold this position for __________ seconds. 5. Tense your abdominal muscles and slowly move your knees back to the starting position. 6. Repeat this exercise on the other side of your body. Repeat __________ times. Complete this exercise __________ times a day. Single knee to chest  1. Lie on your back on a firm surface with both legs straight. 2. Bend one of your knees. Use your hands to move your knee up toward your chest until you feel a gentle stretch in your lower back and buttock. ? Hold your leg in this position by holding on to the front of your knee. ? Keep your other leg as straight as possible. 3. Hold this position for __________ seconds. 4. Slowly return to the starting position. 5. Repeat with your other leg. Repeat __________ times. Complete this exercise __________ times a day. Prone extension on elbows  1. Lie on your abdomen on a firm surface (prone position). 2. Prop yourself up on your  elbows. 3. Use your arms to help lift your chest up until you feel a gentle stretch in your abdomen and your lower back. ? This will place some of your body weight on your elbows. If this is uncomfortable, try stacking pillows under your chest. ? Your hips should stay down, against the surface that you are lying on. Keep your hip and back muscles relaxed. 4. Hold this position for __________ seconds. 5. Slowly relax your upper body and return to the starting position. Repeat __________ times. Complete this exercise __________ times a day. Strengthening exercises These exercises build strength and endurance in your back. Endurance is the ability to use your muscles for a long time, even after they get tired. Pelvic tilt This exercise strengthens the muscles that lie deep in the abdomen. 1. Lie on your back on a firm surface. Bend your knees and keep your feet flat on the floor. 2. Tense your abdominal muscles. Tip your pelvis up toward the ceiling and flatten your lower back into the floor. ? To help with this exercise, you may place a small towel under your lower back and try to push your back into the towel. 3. Hold this position for __________ seconds. 4. Let your muscles relax completely before you repeat this exercise. Repeat __________ times. Complete this exercise __________ times a day. Alternating arm and leg raises  1. Get on your hands and knees on a firm surface. If you are on a hard floor, you  may want to use padding, such as an exercise mat, to cushion your knees. 2. Line up your arms and legs. Your hands should be directly below your shoulders, and your knees should be directly below your hips. 3. Lift your left leg behind you. At the same time, raise your right arm and straighten it in front of you. ? Do not lift your leg higher than your hip. ? Do not lift your arm higher than your shoulder. ? Keep your abdominal and back muscles tight. ? Keep your hips facing the  ground. ? Do not arch your back. ? Keep your balance carefully, and do not hold your breath. 4. Hold this position for __________ seconds. 5. Slowly return to the starting position. 6. Repeat with your right leg and your left arm. Repeat __________ times. Complete this exercise __________ times a day. Abdominal set with straight leg raise  1. Lie on your back on a firm surface. 2. Bend one of your knees and keep your other leg straight. 3. Tense your abdominal muscles and lift your straight leg up, 4-6 inches (10-15 cm) off the ground. 4. Keep your abdominal muscles tight and hold this position for __________ seconds. ? Do not hold your breath. ? Do not arch your back. Keep it flat against the ground. 5. Keep your abdominal muscles tense as you slowly lower your leg back to the starting position. 6. Repeat with your other leg. Repeat __________ times. Complete this exercise __________ times a day. Single leg lower with bent knees 1. Lie on your back on a firm surface. 2. Tense your abdominal muscles and lift your feet off the floor, one foot at a time, so your knees and hips are bent in 90-degree angles (right angles). ? Your knees should be over your hips and your lower legs should be parallel to the floor. 3. Keeping your abdominal muscles tense and your knee bent, slowly lower one of your legs so your toe touches the ground. 4. Lift your leg back up to return to the starting position. ? Do not hold your breath. ? Do not let your back arch. Keep your back flat against the ground. 5. Repeat with your other leg. Repeat __________ times. Complete this exercise __________ times a day. Posture and body mechanics Good posture and healthy body mechanics can help to relieve stress in your body's tissues and joints. Body mechanics refers to the movements and positions of your body while you do your daily activities. Posture is part of body mechanics. Good posture means:  Your spine is in its  natural S-curve position (neutral).  Your shoulders are pulled back slightly.  Your head is not tipped forward. Follow these guidelines to improve your posture and body mechanics in your everyday activities. Standing   When standing, keep your spine neutral and your feet about hip width apart. Keep a slight bend in your knees. Your ears, shoulders, and hips should line up.  When you do a task in which you stand in one place for a long time, place one foot up on a stable object that is 2-4 inches (5-10 cm) high, such as a footstool. This helps keep your spine neutral. Sitting   When sitting, keep your spine neutral and keep your feet flat on the floor. Use a footrest, if necessary, and keep your thighs parallel to the floor. Avoid rounding your shoulders, and avoid tilting your head forward.  When working at a desk or a Teaching laboratory technician, keep your desk  at a height where your hands are slightly lower than your elbows. Slide your chair under your desk so you are close enough to maintain good posture.  When working at a computer, place your monitor at a height where you are looking straight ahead and you do not have to tilt your head forward or downward to look at the screen. Resting  When lying down and resting, avoid positions that are most painful for you.  If you have pain with activities such as sitting, bending, stooping, or squatting, lie in a position in which your body does not bend very much. For example, avoid curling up on your side with your arms and knees near your chest (fetal position).  If you have pain with activities such as standing for a long time or reaching with your arms, lie with your spine in a neutral position and bend your knees slightly. Try the following positions: ? Lying on your side with a pillow between your knees. ? Lying on your back with a pillow under your knees. Lifting   When lifting objects, keep your feet at least shoulder width apart and tighten your  abdominal muscles.  Bend your knees and hips and keep your spine neutral. It is important to lift using the strength of your legs, not your back. Do not lock your knees straight out.  Always ask for help to lift heavy or awkward objects. This information is not intended to replace advice given to you by your health care provider. Make sure you discuss any questions you have with your health care provider. Document Revised: 07/29/2018 Document Reviewed: 04/28/2018 Elsevier Patient Education  [place].  Back Exercises The following exercises strengthen the muscles that help to support the trunk and back. They also help to keep the lower back flexible. Doing these exercises can help to prevent back pain or lessen existing pain.  If you have back pain or discomfort, try doing these exercises 2-3 times each day or as told by your health care provider.  As your pain improves, do them once each day, but increase the number of times that you repeat the steps for each exercise (do more repetitions).  To prevent the recurrence of back pain, continue to do these exercises once each day or as told by your health care provider. Do exercises exactly as told by your health care provider and adjust them as directed. It is normal to feel mild stretching, pulling, tightness, or discomfort as you do these exercises, but you should stop right away if you feel sudden pain or your pain gets worse. Exercises Single knee to chest Repeat these steps 3-5 times for each leg: 1. Lie on your back on a firm bed or the floor with your legs extended. 2. Bring one knee to your chest. Your other leg should stay extended and in contact with the floor. 3. Hold your knee in place by grabbing your knee or thigh with both hands and hold. 4. Pull on your knee until you feel a gentle stretch in your lower back or buttocks. 5. Hold the stretch for 10-30 seconds. 6. Slowly release and straighten your leg. Pelvic  tilt Repeat these steps 5-10 times: 1. Lie on your back on a firm bed or the floor with your legs extended. 2. Bend your knees so they are pointing toward the ceiling and your feet are flat on the floor. 3. Tighten your lower abdominal muscles to press your lower back against the floor. This  motion will tilt your pelvis so your tailbone points up toward the ceiling instead of pointing to your feet or the floor. 4. With gentle tension and even breathing, hold this position for 5-10 seconds. Cat-cow Repeat these steps until your lower back becomes more flexible: 1. Get into a hands-and-knees position on a firm surface. Keep your hands under your shoulders, and keep your knees under your hips. You may place padding under your knees for comfort. 2. Let your head hang down toward your chest. Contract your abdominal muscles and point your tailbone toward the floor so your lower back becomes rounded like the back of a cat. 3. Hold this position for 5 seconds. 4. Slowly lift your head, let your abdominal muscles relax and point your tailbone up toward the ceiling so your back forms a sagging arch like the back of a cow. 5. Hold this position for 5 seconds.  Press-ups Repeat these steps 5-10 times: 1. Lie on your abdomen (face-down) on the floor. 2. Place your palms near your head, about shoulder-width apart. 3. Keeping your back as relaxed as possible and keeping your hips on the floor, slowly straighten your arms to raise the top half of your body and lift your shoulders. Do not use your back muscles to raise your upper torso. You may adjust the placement of your hands to make yourself more comfortable. 4. Hold this position for 5 seconds while you keep your back relaxed. 5. Slowly return to lying flat on the floor.  Bridges Repeat these steps 10 times: 1. Lie on your back on a firm surface. 2. Bend your knees so they are pointing toward the ceiling and your feet are flat on the floor. Your arms  should be flat at your sides, next to your body. 3. Tighten your buttocks muscles and lift your buttocks off the floor until your waist is at almost the same height as your knees. You should feel the muscles working in your buttocks and the back of your thighs. If you do not feel these muscles, slide your feet 1-2 inches farther away from your buttocks. 4. Hold this position for 3-5 seconds. 5. Slowly lower your hips to the starting position, and allow your buttocks muscles to relax completely. If this exercise is too easy, try doing it with your arms crossed over your chest. Abdominal crunches Repeat these steps 5-10 times: 1. Lie on your back on a firm bed or the floor with your legs extended. 2. Bend your knees so they are pointing toward the ceiling and your feet are flat on the floor. 3. Cross your arms over your chest. 4. Tip your chin slightly toward your chest without bending your neck. 5. Tighten your abdominal muscles and slowly raise your trunk (torso) high enough to lift your shoulder blades a tiny bit off the floor. Avoid raising your torso higher than that because it can put too much stress on your low back and does not help to strengthen your abdominal muscles. 6. Slowly return to your starting position. Back lifts Repeat these steps 5-10 times: 1. Lie on your abdomen (face-down) with your arms at your sides, and rest your forehead on the floor. 2. Tighten the muscles in your legs and your buttocks. 3. Slowly lift your chest off the floor while you keep your hips pressed to the floor. Keep the back of your head in line with the curve in your back. Your eyes should be looking at the floor. 4. Hold this position  for 3-5 seconds. 5. Slowly return to your starting position. Contact a health care provider if:  Your back pain or discomfort gets much worse when you do an exercise.  Your worsening back pain or discomfort does not lessen within 2 hours after you exercise. If you have  any of these problems, stop doing these exercises right away. Do not do them again unless your health care provider says that you can. Get help right away if:  You develop sudden, severe back pain. If this happens, stop doing the exercises right away. Do not do them again unless your health care provider says that you can. This information is not intended to replace advice given to you by your health care provider. Make sure you discuss any questions you have with your health care provider. Document Revised: 08/11/2018 Document Reviewed: 01/06/2018 Elsevier Patient Education  [place].

## 2019-08-04 NOTE — Progress Notes (Signed)
Chief Complaint  Patient presents with  . Follow-up   F.u  1. Cad//HTN on lipitor 20 mg , micardis 40-12.5 toprol 50 xl qd saw cards Dr. Saunders Revel 06/08/19. BP controlled no CP 2. Prostate cancer doing well had 28 sessions of radiation and stopped 2 weeks ago f/u PSA in 3 months and f/u urology Dr. Larinda Buttery 3. Chronic back pain was on oxycodone 1/2 pill tid prn and wants to stop and has not had any in 1 week will f/u with pain clinic  4. H/o PVD seen Dr. Scot Dock 05/17/19 f/u prn abis normal    ROS Past Medical History:  Diagnosis Date  . Anemia   . Arthritis   . BPH (benign prostatic hyperplasia)   . CAD (coronary artery disease)    x 7 cardiac stents  . Chronic back pain    L4/5 with chronic right leg pain   . Hx of hepatitis C    treated with Harvoni in 2014  . Hyperlipidemia   . Hypertension   . Neuromuscular disorder ([place])    nerve damage in back  . Neuropathy   . Prostate cancer ([place])   . PVD (peripheral vascular disease) ([place])    2 stents right leg and 1 in left   . Ulcerative colitis ([place])    with diarrhea   Past Surgical History:  Procedure Laterality Date  . back surgery     x 2 lumbar last 09/2016 in [place]  . CARDIAC CATHETERIZATION    . CATARACT EXTRACTION, BILATERAL    . COLONOSCOPY     last 09/2018 in [place]  . HERNIA REPAIR     right   . PENILE PROSTHESIS IMPLANT    . PERCUTANEOUS CORONARY STENT INTERVENTION (PCI-S)     x 7-8 heart  . PERIPHERAL ARTERIAL STENT GRAFT    . PROSTATE BIOPSY    . pvd     with stenting x 2 right leg below knee and 1 left thigh   . SHOULDER ARTHROSCOPY Bilateral    Family History  Problem Relation Age of Onset  . Heart disease Mother   . Stroke Mother   . Breast cancer Mother   . Diabetes Sister   . Colon cancer Neg Hx   . Esophageal cancer Neg Hx   . Inflammatory bowel disease Neg Hx   . Liver disease Neg Hx   . Pancreatic cancer Neg Hx   . Rectal cancer Neg Hx   . Stomach cancer Neg Hx    Social  History   Socioeconomic History  . Marital status: Married    Spouse name: Not on file  . Number of children: 2  . Years of education: Not on file  . Highest education level: Not on file  Occupational History  . Not on file  Tobacco Use  . Smoking status: Former Smoker    Packs/day: 1.00    Years: 40.00    Pack years: 40.00    Types: Cigarettes    Quit date: 04/20/1998    Years since quitting: 21.3  . Smokeless tobacco: Never Used  . Tobacco comment: quit in 2000s   Substance and Sexual Activity  . Alcohol use: Yes    Comment: occasional  . Drug use: Not Currently  . Sexual activity: Yes  Other Topics Concern  . Not on file  Social History Narrative   Moved from [place] in 10/2018   2 sons in 22s as of 11/24/2018    Married wife is  DPR Barbara x 45 years as of 04/2019   Social Determinants of Health   Financial Resource Strain:   . Difficulty of Paying Living Expenses:   Food Insecurity:   . Worried About Charity fundraiser in the Last Year:   . Arboriculturist in the Last Year:   Transportation Needs:   . Film/video editor (Medical):   Marland Kitchen Lack of Transportation (Non-Medical):   Physical Activity:   . Days of Exercise per Week:   . Minutes of Exercise per Session:   Stress:   . Feeling of Stress :   Social Connections:   . Frequency of Communication with Friends and Family:   . Frequency of Social Gatherings with Friends and Family:   . Attends Religious Services:   . Active Member of Clubs or Organizations:   . Attends Archivist Meetings:   Marland Kitchen Marital Status:   Intimate Partner Violence:   . Fear of Current or Ex-Partner:   . Emotionally Abused:   Marland Kitchen Physically Abused:   . Sexually Abused:    Current Meds  Medication Sig  . atorvastatin (LIPITOR) 20 MG tablet Take 1 tablet (20 mg total) by mouth daily at 6 PM.  . clopidogrel (PLAVIX) 75 MG tablet Take 1 tablet (75 mg total) by mouth daily.  . cyclobenzaprine (FLEXERIL) 5 MG tablet Take  1-2 tablets (5-10 mg total) by mouth at bedtime as needed for muscle spasms.  . famotidine (PEPCID) 40 MG tablet Take 1 tablet (40 mg total) by mouth 2 (two) times daily.  . hyoscyamine (LEVBID) 0.375 MG 12 hr tablet Take 1 tablet (0.375 mg total) by mouth 2 (two) times daily.  . isosorbide mononitrate (IMDUR) 30 MG 24 hr tablet Take 1 tablet (30 mg total) by mouth daily.  Marland Kitchen lidocaine (LIDODERM) 5 %   . mesalamine (LIALDA) 1.2 g EC tablet TAKE 2 TABLETS (2.4 G TOTAL) BY MOUTH DAILY WITH BREAKFAST.  . metoprolol succinate (TOPROL-XL) 50 MG 24 hr tablet Take 1 tablet (50 mg total) by mouth daily.  Marland Kitchen omeprazole (PRILOSEC) 40 MG capsule Take 1 capsule (40 mg total) by mouth daily.  . RESTASIS 0.05 % ophthalmic emulsion Place 1 drop into both eyes 2 (two) times daily.   Marland Kitchen telmisartan-hydrochlorothiazide (MICARDIS HCT) 40-12.5 MG tablet Take 1 tablet by mouth daily.  . [DISCONTINUED] cyclobenzaprine (FLEXERIL) 5 MG tablet Take 1-2 tablets (5-10 mg total) by mouth at bedtime as needed for muscle spasms.   Allergies  Allergen Reactions  . Cymbalta [Duloxetine Hcl] Other (See Comments)    drowziness   Recent Results (from the past 2160 hour(s))  SARS Coronavirus 2 ([facility] ONLY)     Status: None   Collection Time: 05/26/19 12:00 AM   Specimen: Nasopharyngeal Swab  Result Value Ref Range   SARS Coronavirus 2 RESULT:  NEGATIVE     Comment: RESULT:  NEGATIVESARS-CoV-2 INTERPRETATION:A NEGATIVE  test result means that SARS-CoV-2 RNA was not present in the specimen above the limit of detection of this test. This does not preclude a possible SARS-CoV-2 infection and should not be used as the  sole basis for patient management decisions. Negative results must be combined with clinical observations, patient history, and epidemiological information. Optimum specimen types and timing for peak viral levels during infections caused by SARS-CoV-2  have not been determined. Collection of multiple specimens  or types of specimens may be necessary to detect virus. Improper specimen collection and handling, sequence variability under primers/probes, or  organism present below the limit of detection may  lead to false negative results. Positive and negative predictive values of testing are highly dependent on prevalence. False negative test results are more likely when prevalence of disease is high.The expected result is NEGATIVE.Fact  Sheet for  Healthcare Providers: https://www.woods-mathews.com/.Fact Sheet for Patients: SugarRoll.be.Normal Reference Range - Negative    Objective  Body mass index is 24.18 kg/m. Wt Readings from Last 3 Encounters:  08/04/19 154 lb 6.4 oz (70 kg)  06/08/19 140 lb (63.5 kg)  05/30/19 140 lb (63.5 kg)   Temp Readings from Last 3 Encounters:  08/04/19 (!) 97.3 F (36.3 C) (Temporal)  05/30/19 97.8 F (36.6 C)  05/23/19 (!) 96.9 F (36.1 C) (Temporal)   BP Readings from Last 3 Encounters:  08/04/19 106/70  06/08/19 126/83  05/30/19 (!) 141/77   Pulse Readings from Last 3 Encounters:  08/04/19 65  06/08/19 (!) 52  05/30/19 75    Physical Exam  Assessment  Plan  Prostate cancer (HCC) F/u Dr. Vira Blanco and rad onc had radiattion x 28 days now off x 2 weeks  F/u PSA with urology   Chronic pain syndrome - Plan: cyclobenzaprine (FLEXERIL) 5 MG tablet  Lumbar degenerative disc disease - Plan: cyclobenzaprine (FLEXERIL) 5 MG tablet  Chronic radicular lumbar pain (Right) - Plan: cyclobenzaprine (FLEXERIL) 5 MG tablet  History of lumbar surgery (x2) - Plan: cyclobenzaprine (FLEXERIL) 5 MG tablet -f/u pain clinic prn   Coronary artery disease involving native coronary artery of native heart without angina pectoris - Plan: nitroGLYCERIN (NITROSTAT) 0.4 MG SL tablet, Lipid panel F/u Dr. Saunders Revel   Essential hypertension - Plan: Comprehensive metabolic panel, CBC with Differential/Platelet, Lipid panel Cont meds  Anemia,  unspecified type - Plan: CBC with Differential/Platelet, Iron, TIBC and Ferritin Panel   HM Get copy of records in future from Dr. Lyman Speller in [place] MD(I.e vaccines utd flu shot 01/04/19 covid vx 2/2   -vaccines ROI sent prev   colonoscopy/EGD repeat leb GI utd and UC in remission   Colonoscopy per pt last 09/2018 in [facility] pt  Former smoker quit in 2000s  PCP Dr. Lyman Speller pt agreeable will get copy of colonoscopy, labs and vaccines    Recurrent prostate cancer tx tbd Dr. [facility] and Dr. Tammi Klippel rad on s/p rad x 2 sessions stopped 2 weeks ago from 08/04/19  Pt ok to get records Dr. Lyman Speller  Dr. Stephanie Coup pain clinic appt 06/07/19 Rx percocet 5-325 1/2 po bid prn lyrica 50 mg bid rtc in 3 weeks   Provider: Dr. Olivia Mackie [name]

## 2019-08-18 NOTE — Progress Notes (Incomplete)
  Radiation Oncology         [phone]) [phone] ________________________________  Name: Erik Nicholson MRN: [id number]  Date: 07/20/2019  DOB: 10-04-1950  End of Treatment Note  Diagnosis:   69 y.o. gentleman with Stage T1c adenocarcinoma of the prostate with Gleason score of 4+3, and PSA of 6.93.     Indication for treatment:  Curative, Definitive Radiotherapy       Radiation treatment dates:   06/12/2019 - 07/20/2019  Site/dose:   The prostate was treated to 70 Gy in 28 fractions of 2.5 Gy  Beams/energy:   The patient was treated with IMRT using volumetric arc therapy delivering 6 MV X-rays to clockwise and counterclockwise circumferential arcs with a 90 degree collimator offset to avoid dose scalloping.  Image guidance was performed with daily cone beam CT prior to each fraction to align to gold markers in the prostate and assure proper bladder and rectal fill volumes.  Immobilization was achieved with BodyFix custom mold.  Narrative: The patient tolerated radiation treatment relatively well.   The patient experienced some minor urinary irritation and modest fatigue.  ***  Plan: The patient has completed radiation treatment. He will return to radiation oncology clinic for routine followup in one month. I advised him to call or return sooner if he has any questions or concerns related to his recovery or treatment. ________________________________  Sheral Apley. Tammi Klippel, M.D.  This document serves as a record of services personally performed by Tyler Pita, MD. It was created on his behalf by Wilburn Mylar, a trained medical scribe. The creation of this record is based on the scribe's personal observations and the provider's statements to them. This document has been checked and approved by the attending provider.

## 2019-08-22 NOTE — Progress Notes (Signed)
One month follow up for treatment from prostate cancer patient reports urgency frequency with leakage denies any hematuria with dysuria with a burning sensation when urinating. Follow up appointment scheduled for 08/22/2019.

## 2019-08-23 ENCOUNTER — Ambulatory Visit
Admission: RE | Admit: 2019-08-23 | Discharge: 2019-08-23 | Disposition: A | Discharge status: Home / Self Care | Payer: Medicare Other | Source: Ambulatory Visit | Attending: Radiation Oncology | Admitting: Radiation Oncology

## 2019-08-23 ENCOUNTER — Other Ambulatory Visit: Payer: Self-pay

## 2019-08-23 DIAGNOSIS — Z510 Encounter for antineoplastic radiation therapy: Secondary | ICD-10-CM | POA: Insufficient documentation

## 2019-08-23 DIAGNOSIS — C61 Malignant neoplasm of prostate: Secondary | ICD-10-CM | POA: Insufficient documentation

## 2019-08-23 NOTE — Progress Notes (Signed)
Radiation Oncology         [phone] ________________________________  Name: Erik Nicholson MRN: [id number]  Date: 08/23/2019  DOB: 12-14-1950  Post Treatment Note  CC: Erik Nicholson, Erik Glow, MD  Erik Nicholson, Erik Nicholson *  Diagnosis:   69 y.o. gentleman with Stage T1c adenocarcinoma of the prostate with Gleason score of 4+3, and PSA of 6.93.   Interval Since Last Radiation:  5 weeks  06/12/2019 - 07/20/2019: The prostate was treated to 70 Gy in 28 fractions of 2.5 Gy  Narrative:  I spoke with the patient to conduct his routine scheduled 1 month follow up visit via telephone to spare the patient unnecessary potential exposure in the healthcare setting during the current COVID-19 pandemic.  The patient was notified in advance and gave permission to proceed with this visit format. He tolerated radiation treatment relatively well with only minor urinary irritation and modest fatigue.                              On review of systems, the patient states that he is doing very well in general.  He reports a gradual improvement in his LUTS but continues with increased frequency, urgency, post void dribble, hesitancy with stinging at the beginning of his stream and nocturia.  He was recently seen with Dr. Karsten Nicholson on 08/22/2019 and was given a prescription for Flomax which he plans to begin tonight.  Hopefully, this will help manage these residual LUTS until the inflammation associated with his radiation treatment resolves.  He specifically denies dysuria, gross hematuria, straining to void or incontinence.  He reports a healthy appetite and is maintaining his weight.  He denies abdominal pain, nausea, vomiting, diarrhea or constipation.  He feels that his energy level is gradually improving and almost back to baseline at this point.  Overall, he is quite pleased with his progress to date.  ALLERGIES:  is allergic to cymbalta [duloxetine hcl].  Meds: Current Outpatient Medications  Medication Sig  Dispense Refill  . atorvastatin (LIPITOR) 20 MG tablet Take 1 tablet (20 mg total) by mouth daily at 6 PM. 90 tablet 2  . clopidogrel (PLAVIX) 75 MG tablet Take 1 tablet (75 mg total) by mouth daily.    . cyclobenzaprine (FLEXERIL) 5 MG tablet Take 1-2 tablets (5-10 mg total) by mouth at bedtime as needed for muscle spasms. 60 tablet 11  . famotidine (PEPCID) 40 MG tablet Take 1 tablet (40 mg total) by mouth 2 (two) times daily. 60 tablet 3  . hyoscyamine (LEVBID) 0.375 MG 12 hr tablet Take 1 tablet (0.375 mg total) by mouth 2 (two) times daily. 60 tablet 2  . lidocaine (LIDODERM) 5 %     . mesalamine (LIALDA) 1.2 g EC tablet TAKE 2 TABLETS (2.4 G TOTAL) BY MOUTH DAILY WITH BREAKFAST. 30 tablet 3  . metoprolol succinate (TOPROL-XL) 50 MG 24 hr tablet Take 1 tablet (50 mg total) by mouth daily. 90 tablet 3  . nitroGLYCERIN (NITROSTAT) 0.4 MG SL tablet Place 1 tablet (0.4 mg total) under the tongue every 5 (five) minutes as needed for chest pain. If taking 2 call 911 90 tablet 3  . omeprazole (PRILOSEC) 40 MG capsule Take 1 capsule (40 mg total) by mouth daily. 90 capsule 3  . RESTASIS 0.05 % ophthalmic emulsion Place 1 drop into both eyes 2 (two) times daily.     Marland Kitchen telmisartan-hydrochlorothiazide (MICARDIS HCT) 40-12.5 MG tablet Take 1 tablet by  mouth daily. 90 tablet 3  . isosorbide mononitrate (IMDUR) 30 MG 24 hr tablet Take 1 tablet (30 mg total) by mouth daily. 90 tablet 3   No current facility-administered medications for this encounter.    Physical Findings:  vitals were not taken for this visit.   /Unable to assess due to telephone follow-up visit format.  Lab Findings: Lab Results  Component Value Date   WBC 5.1 01/19/2019   HGB 12.7 (L) 01/19/2019   HCT 38.5 (L) 01/19/2019   MCV 102.9 (H) 01/19/2019   PLT 209.0 01/19/2019     Radiographic Findings: No results found.  Impression/Plan: 1. 68 y.o. gentleman with Stage T1c adenocarcinoma of the prostate with Gleason score of  4+3, and PSA of 6.93.  He will continue to follow up with urology for ongoing PSA determinations and has an appointment scheduled with Dr. Karsten Nicholson on 02/12/20 for repeat PSA.  He was seen in the office with Dr. Karsten Nicholson on 08/22/2019 and had a PSA drawn at that time but has not yet been notified of those results.  He understands what to expect with regards to PSA monitoring going forward.  He intends to start Flomax tonight to help manage his residual LUTS.  I will look forward to following his response to treatment via correspondence with urology, and would be happy to continue to participate in his care if clinically indicated. I talked to the patient about what to expect in the future, including his risk for erectile dysfunction and rectal bleeding. I encouraged him to call or return to the office if he has any questions regarding his previous radiation or possible radiation side effects. He was comfortable with this plan and will follow up as needed.  A comprehensive survivorship care plan and treatment summary has been mailed to the patient, detailing his prostate cancer diagnosis, treatment course, potential late/long-term effects of treatment, appropriate follow-up care with recommendations for the future, and patient education resources.  A copy of this summary, along with a letter will be sent to the patient's primary care provider via fax message after today's visit.  2. Cancer screening:  Due to Erik Nicholson's history and his age, he should receive screening for skin cancers, colon cancer, and lung cancer.  The information and recommendations are listed on the patient's comprehensive care plan/treatment summary and were reviewed in detail with the patient.     3. Health maintenance and wellness promotion: Erik Nicholson is encouraged to consume 5-7 servings of fruits and vegetables per day. He was provided a copy of the "Nutrition Rainbow" handout, as well as the handout "Take Control of Your Health  and [place]" from the [employer].  He is also encouraged to engage in moderate to vigorous exercise for 30 minutes per day most days of the week. Information was provided regarding the [facility] fitness program, which is designed for cancer survivors to help them become more physically fit after cancer treatments. We discussed that a healthy BMI is 18.5-24.9 and that maintaining a healthy weight reduces risk of cancer recurrences.  He was instructed to limit his alcohol consumption and continue to abstain from tobacco use.  Lastly, he was encouraged to use sunscreen and wear protective clothing when in the sun.     4. Support services/counseling: It is not uncommon for this period of the patient's cancer care trajectory to be one of many emotions and stressors.  Erik Nicholson is encouraged to take advantage of  our many support services programs, support groups, and/or counseling in coping with his new life as a cancer survivor after completing anti-cancer treatment.  He was offered support today through active listening and expressive supportive counseling.  He was given information regarding our available services and encouraged to contact me with any questions or for help enrolling in any of our support group/programs.       Erik Johns, PA-C

## 2019-08-30 NOTE — Progress Notes (Signed)
Cardiology Office Note    Date:  09/06/2019   ID:  Erik Nicholson, DOB 02/23/1951, MRN [id number]  PCP:  [name], [name], MD  Cardiologist:  Nelva Bush, MD  Electrophysiologist:  None   Chief Complaint: Follow up  History of Present Illness:   Erik Nicholson is a 69 y.o. male with history of CAD s/p multiple remote PCIs to the LCx and RCA, PVD s/p bilateral lower extremity stenting, ulcerative colitis, hepatitis C, prostate cancer diagnosed in 05/2019 s/p radiation therapy, HTN, and HLD who presents for follow-up of his CAD.  Most recent LHC from 02/2012 showed patent stents in the LCx and RCA with 40% stenosis of the mid RCA. Most recent nuclear stress test in 02/2019 showed no significant ischemia or scar with an EF of 56%. He was started on Imdur. Echo in 02/2019 showed an EF of 60-65%, Gr1DD, normal RV cavity size and ventricular function, no significant valvular abnormalities. Most recent lower extremity ABIs were normal bilaterally. He was diagnosed with prostate cancer in early 2021 with brief discontinuation of Plavix prior to seed implantation. He was last seen virtually in 05/2019 and was doing relatively well from a cardiac perspective, noting a single episode of chest pain with stable exertional dyspnea. He was back on Plavix, though not taking it daily. He was advised to take Plavix daily with continued monitoring of symptoms given infrequency and reassuring Myoview in 02/2019. He was not experiencing any lifestyle-limiting claudication.   He comes in doing well from a cardiac perspective.  He has stable mild exertional dyspnea that has been unchanged over the past several years.  No chest pain, palpitations, dizziness, presyncope, syncope.  He has continued and stable right lower extremity neuropathy which he attributes to low back pain.  No symptoms of claudication.  No lower extremity swelling, abdominal distention, orthopnea, PND, or early satiety.  He has tolerated  radiation treatment for his prostate cancer without issues.  He is in good spirits.  Tolerating all medications.  No issues or concerns at this time.   Labs independently reviewed: 01/2019 - potassium 4.1, BUN 19, serum creatinine 0.81, albumin 4.5, AST/ALT normal, Hgb 12.7, PLT 209, TC 167, TG 74, HDL 83, LDL 68, TSH normal  Past Medical History:  Diagnosis Date  . Anemia   . Arthritis   . BPH (benign prostatic hyperplasia)   . CAD (coronary artery disease)    x 7 cardiac stents  . Chronic back pain    L4/5 with chronic right leg pain   . Hx of hepatitis C    treated with Harvoni in 2014  . Hyperlipidemia   . Hypertension   . Neuromuscular disorder ([place])    nerve damage in back  . Neuropathy   . Prostate cancer ([place])   . PVD (peripheral vascular disease) ([place])    2 stents right leg and 1 in left   . Ulcerative colitis ([place])    with diarrhea    Past Surgical History:  Procedure Laterality Date  . back surgery     x 2 lumbar last 09/2016 in [place]  . CARDIAC CATHETERIZATION    . CATARACT EXTRACTION, BILATERAL    . COLONOSCOPY     last 09/2018 in [place]  . HERNIA REPAIR     right   . PENILE PROSTHESIS IMPLANT    . PERCUTANEOUS CORONARY STENT INTERVENTION (PCI-S)     x 7-8 heart  . PERIPHERAL ARTERIAL STENT GRAFT    .  PROSTATE BIOPSY    . pvd     with stenting x 2 right leg below knee and 1 left thigh   . SHOULDER ARTHROSCOPY Bilateral     Current Medications: Current Meds  Medication Sig  . atorvastatin (LIPITOR) 20 MG tablet Take 1 tablet (20 mg total) by mouth daily at 6 PM.  . clopidogrel (PLAVIX) 75 MG tablet Take 1 tablet (75 mg total) by mouth daily.  . cyclobenzaprine (FLEXERIL) 5 MG tablet Take 1-2 tablets (5-10 mg total) by mouth at bedtime as needed for muscle spasms.  . famotidine (PEPCID) 40 MG tablet Take 1 tablet (40 mg total) by mouth 2 (two) times daily.  . hyoscyamine (LEVBID) 0.375 MG 12 hr tablet Take 1 tablet (0.375 mg total)  by mouth 2 (two) times daily.  Marland Kitchen lidocaine (LIDODERM) 5 %   . mesalamine (LIALDA) 1.2 g EC tablet TAKE 2 TABLETS (2.4 G TOTAL) BY MOUTH DAILY WITH BREAKFAST.  . metoprolol succinate (TOPROL-XL) 50 MG 24 hr tablet Take 1 tablet (50 mg total) by mouth daily.  . nitroGLYCERIN (NITROSTAT) 0.4 MG SL tablet Place 1 tablet (0.4 mg total) under the tongue every 5 (five) minutes as needed for chest pain. If taking 2 call 911  . omeprazole (PRILOSEC) 40 MG capsule Take 1 capsule (40 mg total) by mouth daily.  . RESTASIS 0.05 % ophthalmic emulsion Place 1 drop into both eyes 2 (two) times daily.   Marland Kitchen telmisartan-hydrochlorothiazide (MICARDIS HCT) 40-12.5 MG tablet Take 1 tablet by mouth daily.    Allergies:   Cymbalta [duloxetine hcl]   Social History   Socioeconomic History  . Marital status: Married    Spouse name: Not on file  . Number of children: 2  . Years of education: Not on file  . Highest education level: Not on file  Occupational History  . Not on file  Tobacco Use  . Smoking status: Former Smoker    Packs/day: 1.00    Years: 40.00    Pack years: 40.00    Types: Cigarettes    Quit date: 04/20/1998    Years since quitting: 21.3  . Smokeless tobacco: Never Used  . Tobacco comment: quit in 2000s   Substance and Sexual Activity  . Alcohol use: Yes    Comment: occasional  . Drug use: Not Currently  . Sexual activity: Yes  Other Topics Concern  . Not on file  Social History Narrative   Moved from [place] in 10/2018   2 sons in 72s as of 11/24/2018    Married wife is DPR Pamala Hurry x 45 years as of 04/2019   Social Determinants of Health   Financial Resource Strain:   . Difficulty of Paying Living Expenses:   Food Insecurity:   . Worried About Charity fundraiser in the Last Year:   . Arboriculturist in the Last Year:   Transportation Needs:   . Film/video editor (Medical):   Marland Kitchen Lack of Transportation (Non-Medical):   Physical Activity:   . Days of Exercise per  Week:   . Minutes of Exercise per Session:   Stress:   . Feeling of Stress :   Social Connections:   . Frequency of Communication with Friends and Family:   . Frequency of Social Gatherings with Friends and Family:   . Attends Religious Services:   . Active Member of Clubs or Organizations:   . Attends Archivist Meetings:   Marland Kitchen Marital Status:  Family History:  The patient's family history includes Breast cancer in his mother; Diabetes in his sister; Heart disease in his mother; Stroke in his mother. There is no history of Colon cancer, Esophageal cancer, Inflammatory bowel disease, Liver disease, Pancreatic cancer, Rectal cancer, or Stomach cancer.  ROS:   Review of Systems  Constitutional: Negative for chills, diaphoresis, fever, malaise/fatigue and weight loss.  HENT: Negative for congestion.   Eyes: Negative for discharge and redness.  Respiratory: Negative for cough, sputum production, shortness of breath and wheezing.   Cardiovascular: Negative for chest pain, palpitations, orthopnea, claudication, leg swelling and PND.  Gastrointestinal: Negative for abdominal pain, heartburn, melena, nausea and vomiting.  Musculoskeletal: Positive for back pain and joint pain. Negative for falls and myalgias.  Skin: Negative for rash.  Neurological: Negative for dizziness, tingling, tremors, sensory change, speech change, focal weakness, loss of consciousness and weakness.  Endo/Heme/Allergies: Does not bruise/bleed easily.  Psychiatric/Behavioral: Negative for substance abuse. The patient is not nervous/anxious.   All other systems reviewed and are negative.    EKGs/Labs/Other Studies Reviewed:    Studies reviewed were summarized above. The additional studies were reviewed today:  Cath/PCI:  RLE PTA (02/19/2014): Right mid and distal SFA laser atherectomy and PTA/stenting.  Runoff (02/01/2014): Severe right SFA stenosis with 2-vessel runoff.  Abdominal aortogran and  runoff/PTA (04/26/2012): Right SFA stenting and left SFA PTA with DCB  LHC (02/25/2012): LMCA, LAD, and LCx "without any obtructive lesions). RCA with 40% mid vessel stenosis. Proximal and mid RCA stents patent. LVEF 60%  Abdominal aortogram and runoff (02/25/2012): 95% bilateral SFA stenoses.  CV Surgery:  None  EP Procedures and Devices:  None  Non-Invasive Evaluation(s):   ABIs (05/17/2019): Right: Resting right ankle-brachial index is within normal range. No evidence of significant right lower extremity arterial disease.  The right toe-brachial index is normal. Left: Resting left ankle-brachial index is within normal range. No evidence of significant left lower extremity arterial disease. The left toe-brachial index is normal.  Myocardial perfusion stress test (03/07/2019): Normal study without ischemia or scar. LVEF 56%.  TTE (03/02/2019): Normal LV size and wall thickness. LVEF 60-65% with grade 1 diastolic dysfunction. Normal RV size and function. No significant valvular abnormality.  ABIs and bilateral lower extremity Doppler (03/02/2019): ABIs normal (1.1 bilaterally). Bilateral SFA stents are patent with mild stenosis in the midsegment of the right SFA stent.  Abdominal aorta duplex (07/22/2016): No AAA. Maximal aortic diameter 2.8 cm.  TTE (07/22/2016): Normal LV size with mild LVH and grade 1 diastolic dysfunction. LVEF 65%. Trace aortic and mitral regurgitation.  Pharmacologic MPI (07/21/2016): Normal myocardial perfusion without ischemia or scar.  Renal artery duplex (02/19/2012): Normal without evidence of renal artery stenosis   EKG:  EKG is ordered today.  The EKG ordered today demonstrates NSR, 81 bpm, rare PAC, no acute ST-T changes  Recent Labs: 01/19/2019: ALT 14; BUN 19; Creatinine, Ser 0.81; Hemoglobin 12.7; Platelets 209.0; Potassium 4.1; Sodium 140; TSH 0.51  Recent Lipid Panel    Component Value Date/Time   CHOL 167 01/19/2019 0845   TRIG 74.0  01/19/2019 0845   HDL 83.30 01/19/2019 0845   CHOLHDL 2 01/19/2019 0845   VLDL 14.8 01/19/2019 0845   LDLCALC 68 01/19/2019 0845    PHYSICAL EXAM:    VS:  BP 110/62 (BP Location: Left Arm, Patient Position: Sitting, Cuff Size: Normal)   Pulse 81   Ht 5' 7"  (1.702 m)   Wt 154 lb (69.9 kg)   SpO2 98%  BMI 24.12 kg/m   BMI: Body mass index is 24.12 kg/m.  Physical Exam  Constitutional: He is oriented to person, place, and time. He appears well-developed and well-nourished.  HENT:  Head: Normocephalic and atraumatic.  Eyes: Right eye exhibits no discharge. Left eye exhibits no discharge.  Neck: No JVD present.  Cardiovascular: Normal rate, regular rhythm, S1 normal, S2 normal and normal heart sounds. Exam reveals no distant heart sounds, no friction rub, no midsystolic click and no opening snap.  No murmur heard. Pulses:      Posterior tibial pulses are 2+ on the right side and 2+ on the left side.  Pulmonary/Chest: Effort normal and breath sounds normal. No respiratory distress. He has no decreased breath sounds. He has no wheezes. He has no rales. He exhibits no tenderness.  Abdominal: Soft. He exhibits no distension. There is no abdominal tenderness.  Musculoskeletal:        General: No edema.     Cervical back: Normal range of motion.  Neurological: He is alert and oriented to person, place, and time.  Skin: Skin is warm and dry. No cyanosis. Nails show no clubbing.  Psychiatric: He has a normal mood and affect. His speech is normal and behavior is normal. Judgment and thought content normal.    Wt Readings from Last 3 Encounters:  09/06/19 154 lb (69.9 kg)  08/04/19 154 lb 6.4 oz (70 kg)  06/08/19 140 lb (63.5 kg)     ASSESSMENT & PLAN:   1. CAD involving the native coronary arteries with stable angina: He is doing well with stable exertional dyspnea that has been unchanged over the past several years.  No symptoms concerning for his prior unstable angina.  He  continues to live an active lifestyle without limitation.  Continue secondary prevention with Plavix, atorvastatin, Imdur, metoprolol, and sublingual nitroglycerin.  Given stable symptoms and recent reassuring Lexiscan MPI, no plans for further ischemic evaluation at this time.  2. PVD: No symptoms of claudication.  Normal noninvasive imaging as outlined above.  Continue risk factor modification.  3. HTN: Blood pressure is well controlled.  Continue current medications including isosorbide mononitrate, metoprolol succinate, and telmisartan/HCTZ.  4. HLD: LDL 68 from 01/2019 with normal liver function at that time.  Remains on atorvastatin.  Disposition: F/u with Dr. Saunders Revel or an APP in 6 months, sooner if needed.   Medication Adjustments/Labs and Tests Ordered: Current medicines are reviewed at length with the patient today.  Concerns regarding medicines are outlined above. Medication changes, Labs and Tests ordered today are summarized above and listed in the Patient Instructions accessible in Encounters.   Signed, Christell Faith, PA-C 09/06/2019 1:33 PM     [address] [place], [place] [address] [phone]

## 2019-09-06 ENCOUNTER — Ambulatory Visit (INDEPENDENT_AMBULATORY_CARE_PROVIDER_SITE_OTHER): Payer: Medicare Other | Admitting: Physician Assistant

## 2019-09-06 ENCOUNTER — Other Ambulatory Visit: Payer: Self-pay

## 2019-09-06 ENCOUNTER — Encounter: Payer: Self-pay | Admitting: Physician Assistant

## 2019-09-06 VITALS — BP 110/62 | HR 81 | Ht 67.0 in | Wt 154.0 lb

## 2019-09-06 DIAGNOSIS — I739 Peripheral vascular disease, unspecified: Secondary | ICD-10-CM

## 2019-09-06 DIAGNOSIS — I2510 Atherosclerotic heart disease of native coronary artery without angina pectoris: Secondary | ICD-10-CM

## 2019-09-06 DIAGNOSIS — I25118 Atherosclerotic heart disease of native coronary artery with other forms of angina pectoris: Secondary | ICD-10-CM | POA: Diagnosis not present

## 2019-09-06 DIAGNOSIS — I10 Essential (primary) hypertension: Secondary | ICD-10-CM

## 2019-09-06 DIAGNOSIS — E785 Hyperlipidemia, unspecified: Secondary | ICD-10-CM | POA: Diagnosis not present

## 2019-09-06 NOTE — Patient Instructions (Addendum)
Medication Instructions:  Your physician recommends that you continue on your current medications as directed. Please refer to the Current Medication list given to you today.  *If you need a refill on your cardiac medications before your next appointment, please call your pharmacy*   Lab Work: None ordered  If you have labs (blood work) drawn today and your tests are completely normal, you will receive your results only by: Marland Kitchen MyChart Message (if you have MyChart) OR . A paper copy in the mail If you have any lab test that is abnormal or we need to change your treatment, we will call you to review the results.   Testing/Procedures: None ordered    Follow-Up: At [facility], you and your health needs are our priority.  As part of our continuing mission to provide you with exceptional heart care, we have created designated Provider Care Teams.  These Care Teams include your primary Cardiologist (physician) and Advanced Practice Providers (APPs -  Physician Assistants and Nurse Practitioners) who all work together to provide you with the care you need, when you need it.  We recommend signing up for the patient portal called "MyChart".  Sign up information is provided on this After Visit Summary.  MyChart is used to connect with patients for Virtual Visits (Telemedicine).  Patients are able to view lab/test results, encounter notes, upcoming appointments, etc.  Non-urgent messages can be sent to your provider as well.   To learn more about what you can do with MyChart, go to [email or web address].    Your next appointment:   6 months  The format for your next appointment:   In Person  Provider:    You may see Nelva Bush, MD or Christell Faith, PA-C

## 2019-09-21 ENCOUNTER — Other Ambulatory Visit: Payer: Self-pay | Admitting: Internal Medicine

## 2019-09-21 DIAGNOSIS — I10 Essential (primary) hypertension: Secondary | ICD-10-CM

## 2019-09-21 MED ORDER — TELMISARTAN-HCTZ 40-12.5 MG PO TABS: 1.0000 | ORAL_TABLET | Freq: Every day | ORAL | 3 refills | Status: DC

## 2019-09-30 ENCOUNTER — Other Ambulatory Visit: Payer: Self-pay | Admitting: Gastroenterology

## 2019-10-09 ENCOUNTER — Other Ambulatory Visit: Payer: Self-pay | Admitting: Internal Medicine

## 2019-10-31 ENCOUNTER — Other Ambulatory Visit: Payer: Self-pay | Admitting: Internal Medicine

## 2019-11-21 NOTE — Progress Notes (Signed)
  Radiation Oncology         [phone]) [phone] ________________________________  Name: Erik Nicholson MRN: [id number]  Date: 07/20/2019  DOB: September 19, 1950  End of Treatment Note  Diagnosis:   69 y.o. gentleman with Stage T1c adenocarcinoma of the prostate with Gleason score of 4+3, and PSA of 6.93.     Indication for treatment:  Curative, Definitive Radiotherapy       Radiation treatment dates:   06/12/2019 - 07/20/2019  Site/dose:   The prostate was treated to 70 Gy in 28 fractions of 2.5 Gy  Beams/energy:   The patient was treated with IMRT using volumetric arc therapy delivering 6 MV X-rays to clockwise and counterclockwise circumferential arcs with a 90 degree collimator offset to avoid dose scalloping.  Image guidance was performed with daily cone beam CT prior to each fraction to align to gold markers in the prostate and assure proper bladder and rectal fill volumes.  Immobilization was achieved with BodyFix custom mold.  Narrative: The patient tolerated radiation treatment relatively well.   The patient experienced some minor urinary irritation and modest fatigue.    Plan: The patient has completed radiation treatment. He will return to radiation oncology clinic for routine followup in one month. I advised him to call or return sooner if he has any questions or concerns related to his recovery or treatment. ________________________________  Sheral Apley. Tammi Klippel, M.D.

## 2019-12-15 ENCOUNTER — Telehealth: Payer: Self-pay | Admitting: Internal Medicine

## 2019-12-15 DIAGNOSIS — I10 Essential (primary) hypertension: Secondary | ICD-10-CM

## 2019-12-15 MED ORDER — METOPROLOL SUCCINATE ER 50 MG PO TB24: 50.0000 mg | ORAL_TABLET | Freq: Every day | ORAL | 1 refills | Status: DC

## 2019-12-15 NOTE — Telephone Encounter (Signed)
Medication sent in to preferred pharmacy per protocol.

## 2020-01-25 ENCOUNTER — Other Ambulatory Visit: Payer: Self-pay

## 2020-01-30 ENCOUNTER — Other Ambulatory Visit (INDEPENDENT_AMBULATORY_CARE_PROVIDER_SITE_OTHER): Payer: Medicare Other

## 2020-01-30 ENCOUNTER — Other Ambulatory Visit: Payer: Self-pay

## 2020-01-30 DIAGNOSIS — D649 Anemia, unspecified: Secondary | ICD-10-CM

## 2020-01-30 DIAGNOSIS — Z1329 Encounter for screening for other suspected endocrine disorder: Secondary | ICD-10-CM

## 2020-01-30 DIAGNOSIS — I10 Essential (primary) hypertension: Secondary | ICD-10-CM

## 2020-01-30 DIAGNOSIS — I2510 Atherosclerotic heart disease of native coronary artery without angina pectoris: Secondary | ICD-10-CM

## 2020-01-30 DIAGNOSIS — Z1389 Encounter for screening for other disorder: Secondary | ICD-10-CM

## 2020-01-30 LAB — CBC WITH DIFFERENTIAL/PLATELET
Basophils Absolute: 0 10*3/uL (ref 0.0–0.1)
Basophils Relative: 0.6 % (ref 0.0–3.0)
Eosinophils Absolute: 0.1 10*3/uL (ref 0.0–0.7)
Eosinophils Relative: 2.6 % (ref 0.0–5.0)
HCT: 36.1 % — ABNORMAL LOW (ref 39.0–52.0)
Hemoglobin: 12.1 g/dL — ABNORMAL LOW (ref 13.0–17.0)
Lymphocytes Relative: 20.2 % (ref 12.0–46.0)
Lymphs Abs: 1 10*3/uL (ref 0.7–4.0)
MCHC: 33.6 g/dL (ref 30.0–36.0)
MCV: 100.2 fl — ABNORMAL HIGH (ref 78.0–100.0)
Monocytes Absolute: 0.5 10*3/uL (ref 0.1–1.0)
Monocytes Relative: 10.8 % (ref 3.0–12.0)
Neutro Abs: 3.3 10*3/uL (ref 1.4–7.7)
Neutrophils Relative %: 65.8 % (ref 43.0–77.0)
Platelets: 179 10*3/uL (ref 150.0–400.0)
RBC: 3.6 Mil/uL — ABNORMAL LOW (ref 4.22–5.81)
RDW: 12.4 % (ref 11.5–15.5)
WBC: 5.1 10*3/uL (ref 4.0–10.5)

## 2020-01-30 LAB — COMPREHENSIVE METABOLIC PANEL
ALT: 29 U/L (ref 0–53)
AST: 31 U/L (ref 0–37)
Albumin: 4.1 g/dL (ref 3.5–5.2)
Alkaline Phosphatase: 64 U/L (ref 39–117)
BUN: 17 mg/dL (ref 6–23)
CO2: 27 mEq/L (ref 19–32)
Calcium: 9.6 mg/dL (ref 8.4–10.5)
Chloride: 103 mEq/L (ref 96–112)
Creatinine, Ser: 0.82 mg/dL (ref 0.40–1.50)
GFR: 88.74 mL/min (ref >60.00)
Glucose, Bld: 115 mg/dL — ABNORMAL HIGH (ref 70–99)
Potassium: 3.5 mEq/L (ref 3.5–5.1)
Sodium: 139 mEq/L (ref 135–145)
Total Bilirubin: 1 mg/dL (ref 0.2–1.2)
Total Protein: 7.7 g/dL (ref 6.0–8.3)

## 2020-01-30 LAB — LIPID PANEL
Cholesterol: 126 mg/dL (ref 0–200)
HDL: 58.8 mg/dL (ref >39.00)
LDL Cholesterol: 45 mg/dL (ref 0–99)
NonHDL: 67.13
Total CHOL/HDL Ratio: 2
Triglycerides: 113 mg/dL (ref 0.0–149.0)
VLDL: 22.6 mg/dL (ref 0.0–40.0)

## 2020-01-30 LAB — TSH: TSH: 0.72 u[IU]/mL (ref 0.35–4.50)

## 2020-01-31 LAB — IRON,TIBC AND FERRITIN PANEL
%SAT: 22 % (calc) (ref 20–48)
Ferritin: 179 ng/mL (ref 24–380)
Iron: 78 ug/dL (ref 50–180)
TIBC: 354 mcg/dL (calc) (ref 250–425)

## 2020-01-31 LAB — URINALYSIS, ROUTINE W REFLEX MICROSCOPIC
Bilirubin Urine: NEGATIVE
Glucose, UA: NEGATIVE
Hgb urine dipstick: NEGATIVE
Ketones, ur: NEGATIVE
Leukocytes,Ua: NEGATIVE
Nitrite: NEGATIVE
Protein, ur: NEGATIVE
Specific Gravity, Urine: 1.021 (ref 1.001–1.03)
pH: 6 (ref 5.0–8.0)

## 2020-02-06 ENCOUNTER — Other Ambulatory Visit: Payer: Self-pay

## 2020-02-06 ENCOUNTER — Telehealth: Payer: Self-pay

## 2020-02-06 ENCOUNTER — Encounter: Payer: Self-pay | Admitting: Internal Medicine

## 2020-02-06 ENCOUNTER — Ambulatory Visit (INDEPENDENT_AMBULATORY_CARE_PROVIDER_SITE_OTHER): Payer: Medicare Other | Admitting: Internal Medicine

## 2020-02-06 VITALS — BP 116/76 | HR 89 | Temp 97.9°F | Ht 67.0 in | Wt 167.4 lb

## 2020-02-06 DIAGNOSIS — I10 Essential (primary) hypertension: Secondary | ICD-10-CM | POA: Diagnosis not present

## 2020-02-06 DIAGNOSIS — M5441 Lumbago with sciatica, right side: Secondary | ICD-10-CM | POA: Diagnosis not present

## 2020-02-06 DIAGNOSIS — H43392 Other vitreous opacities, left eye: Secondary | ICD-10-CM

## 2020-02-06 DIAGNOSIS — R739 Hyperglycemia, unspecified: Secondary | ICD-10-CM | POA: Diagnosis not present

## 2020-02-06 DIAGNOSIS — G8929 Other chronic pain: Secondary | ICD-10-CM | POA: Insufficient documentation

## 2020-02-06 DIAGNOSIS — C61 Malignant neoplasm of prostate: Secondary | ICD-10-CM

## 2020-02-06 LAB — POCT GLYCOSYLATED HEMOGLOBIN (HGB A1C)
HbA1c POC (<> result, manual entry): 4.9 % (ref 4.0–5.6)
HbA1c, POC (controlled diabetic range): 4.9 % (ref 0.0–7.0)
HbA1c, POC (prediabetic range): 4.9 % — AB (ref 5.7–6.4)
Hemoglobin A1C: 4.9 % (ref 4.0–5.6)

## 2020-02-06 NOTE — Telephone Encounter (Signed)
Letters were mailed to the pt to contact our office to set up procedure.  Called today and left message will attempt tomorrow.

## 2020-02-06 NOTE — Telephone Encounter (Signed)
-----   Message from Irving Copas., MD sent at 02/06/2020  4:40 PM EDT ----- Dr. Terese Door, Thanks for this.Cledis Sohn, can you please reach out to the patient and see if he would like to move forward with the EGD with EUS follow-up that was recommended earlier this year?If he wants to be seen in clinic for follow-up that is fine as well.Thanks.GM ----- Message ----- From: [name], [name], MD Sent: 02/06/2020   9:27 AM EDT To: Irving Copas., MD  Egd abnormal 05/30/19 nodule and you rec EUS repeat EGD in 3 months but its been longer

## 2020-02-06 NOTE — Progress Notes (Signed)
Chief Complaint  Patient presents with  . Follow-up   F/u  1. Left eye floaters and flashes of light hasnt seen eye md in >1 year and will call to f/u or may ask for eye MD down here  2. Prostate cancer history tx'ed 28 rounds radiation and doing well due for repeat PSA last Dr. Karsten Ro Results for FISHER, HARGADON (MRN [id number]) as of 02/06/2020 09:46  01/19/2019 08:45 PSA: 6.93 (H) 3. HTN BP controlled today on micardis 40-12.5 mg qd, denies CP 4. Chronic back pain with right leg dragging at times and abnormal MRI 2021 not taking pain meds and PT surgery steroid injections in the past did not help pain 8/10 and he deals with it   Review of Systems  Constitutional: Negative for weight loss.  HENT: Negative for hearing loss.   Eyes: Negative for blurred vision.  Cardiovascular: Negative for chest pain.  Gastrointestinal: Negative for diarrhea.  Musculoskeletal: Positive for back pain and joint pain.       Right wrist has brace on   Skin: Negative for rash.  Neurological: Negative for headaches.  Psychiatric/Behavioral: Negative for depression and memory loss.   Past Medical History:  Diagnosis Date  . Anemia   . Arthritis   . BPH (benign prostatic hyperplasia)   . CAD (coronary artery disease)    x 7 cardiac stents  . Chronic back pain    L4/5 with chronic right leg pain   . Floater, vitreous, left   . Hx of hepatitis C    treated with Harvoni in 2014  . Hyperlipidemia   . Hypertension   . Neuromuscular disorder ([place])    nerve damage in back  . Neuropathy   . Prostate cancer ([place])   . PVD (peripheral vascular disease) ([place])    2 stents right leg and 1 in left   . Ulcerative colitis ([place])    with diarrhea   Past Surgical History:  Procedure Laterality Date  . back surgery     x 2 lumbar last 09/2016 in [place]  . CARDIAC CATHETERIZATION    . CATARACT EXTRACTION, BILATERAL    . COLONOSCOPY     last 09/2018 in [place]  . HERNIA REPAIR     right    . PENILE PROSTHESIS IMPLANT    . PERCUTANEOUS CORONARY STENT INTERVENTION (PCI-S)     x 7-8 heart  . PERIPHERAL ARTERIAL STENT GRAFT    . PROSTATE BIOPSY    . pvd     with stenting x 2 right leg below knee and 1 left thigh   . SHOULDER ARTHROSCOPY Bilateral    Family History  Problem Relation Age of Onset  . Heart disease Mother   . Stroke Mother   . Breast cancer Mother   . Diabetes Sister   . Diabetes Brother   . Colon cancer Neg Hx   . Esophageal cancer Neg Hx   . Inflammatory bowel disease Neg Hx   . Liver disease Neg Hx   . Pancreatic cancer Neg Hx   . Rectal cancer Neg Hx   . Stomach cancer Neg Hx    Social History   Socioeconomic History  . Marital status: Married    Spouse name: Not on file  . Number of children: 2  . Years of education: Not on file  . Highest education level: Not on file  Occupational History  . Not on file  Tobacco Use  . Smoking status: Former Smoker  Packs/day: 1.00    Years: 40.00    Pack years: 40.00    Types: Cigarettes    Quit date: 04/20/1998    Years since quitting: 21.8  . Smokeless tobacco: Never Used  . Tobacco comment: quit in 2000s   Vaping Use  . Vaping Use: Never used  Substance and Sexual Activity  . Alcohol use: Yes    Comment: occasional  . Drug use: Not Currently  . Sexual activity: Yes  Other Topics Concern  . Not on file  Social History Narrative   Moved from [place] in 10/2018   2 sons in 48s as of 11/24/2018    Married wife is DPR Pamala Hurry x 45 years as of 04/2019   Social Determinants of Health   Financial Resource Strain:   . Difficulty of Paying Living Expenses: Not on file  Food Insecurity:   . Worried About Charity fundraiser in the Last Year: Not on file  . Ran Out of Food in the Last Year: Not on file  Transportation Needs:   . Lack of Transportation (Medical): Not on file  . Lack of Transportation (Non-Medical): Not on file  Physical Activity:   . Days of Exercise per Week: Not on  file  . Minutes of Exercise per Session: Not on file  Stress:   . Feeling of Stress : Not on file  Social Connections:   . Frequency of Communication with Friends and Family: Not on file  . Frequency of Social Gatherings with Friends and Family: Not on file  . Attends Religious Services: Not on file  . Active Member of Clubs or Organizations: Not on file  . Attends Archivist Meetings: Not on file  . Marital Status: Not on file  Intimate Partner Violence:   . Fear of Current or Ex-Partner: Not on file  . Emotionally Abused: Not on file  . Physically Abused: Not on file  . Sexually Abused: Not on file   Current Meds  Medication Sig  . atorvastatin (LIPITOR) 20 MG tablet TAKE 1 TABLET (20 MG TOTAL) BY MOUTH DAILY AT 6 PM.  . clopidogrel (PLAVIX) 75 MG tablet Take 1 tablet (75 mg total) by mouth daily.  . cyclobenzaprine (FLEXERIL) 5 MG tablet Take 1-2 tablets (5-10 mg total) by mouth at bedtime as needed for muscle spasms.  . famotidine (PEPCID) 40 MG tablet Take 1 tablet (40 mg total) by mouth 2 (two) times daily.  . hyoscyamine (LEVBID) 0.375 MG 12 hr tablet TAKE 1 TABLET (0.375 MG TOTAL) BY MOUTH 2 (TWO) TIMES DAILY.  Marland Kitchen lidocaine (LIDODERM) 5 %   . mesalamine (LIALDA) 1.2 g EC tablet TAKE 2 TABLETS (2.4 G TOTAL) BY MOUTH DAILY WITH BREAKFAST.  . metoprolol succinate (TOPROL-XL) 50 MG 24 hr tablet Take 1 tablet (50 mg total) by mouth daily.  Marland Kitchen omeprazole (PRILOSEC) 40 MG capsule Take 1 capsule (40 mg total) by mouth daily.  . RESTASIS 0.05 % ophthalmic emulsion Place 1 drop into both eyes 2 (two) times daily.   . tamsulosin (FLOMAX) 0.4 MG CAPS capsule Take 0.4 mg by mouth daily.  Marland Kitchen telmisartan-hydrochlorothiazide (MICARDIS HCT) 40-12.5 MG tablet Take 1 tablet by mouth daily.   Allergies  Allergen Reactions  . Cymbalta [Duloxetine Hcl] Other (See Comments)    drowziness   Recent Results (from the past 2160 hour(s))  Iron, TIBC and Ferritin Panel     Status: None    Collection Time: 01/30/20  8:03 AM  Result Value  Ref Range   Iron 78 50 - 180 mcg/dL   TIBC 354 250 - 425 mcg/dL (calc)   %SAT 22 20 - 48 % (calc)   Ferritin 179 24 - 380 ng/mL  Urinalysis, Routine w reflex microscopic     Status: None   Collection Time: 01/30/20  8:03 AM  Result Value Ref Range   Color, Urine YELLOW YELLOW   APPearance CLEAR CLEAR   Specific Gravity, Urine 1.021 1.001 - 1.03   pH 6.0 5.0 - 8.0   Glucose, UA NEGATIVE NEGATIVE   Bilirubin Urine NEGATIVE NEGATIVE   Ketones, ur NEGATIVE NEGATIVE   Hgb urine dipstick NEGATIVE NEGATIVE   Protein, ur NEGATIVE NEGATIVE   Nitrite NEGATIVE NEGATIVE   Leukocytes,Ua NEGATIVE NEGATIVE  TSH     Status: None   Collection Time: 01/30/20  8:03 AM  Result Value Ref Range   TSH 0.72 0.35 - 4.50 uIU/mL  Lipid panel     Status: None   Collection Time: 01/30/20  8:03 AM  Result Value Ref Range   Cholesterol 126 0 - 200 mg/dL    Comment: ATP III Classification       Desirable:  < 200 mg/dL               Borderline High:  200 - 239 mg/dL          High:  > = 240 mg/dL   Triglycerides 113.0 0 - 149 mg/dL    Comment: Normal:  <150 mg/dLBorderline High:  150 - 199 mg/dL   HDL 58.80 >39.00 mg/dL   VLDL 22.6 0.0 - 40.0 mg/dL   LDL Cholesterol 45 0 - 99 mg/dL   Total CHOL/HDL Ratio 2     Comment:                Men          Women1/2 Average Risk     3.4          3.3Average Risk          5.0          4.42X Average Risk          9.6          7.13X Average Risk          15.0          11.0                       NonHDL 67.13     Comment: NOTE:  Non-HDL goal should be 30 mg/dL higher than patient's LDL goal (i.e. LDL goal of < 70 mg/dL, would have non-HDL goal of < 100 mg/dL)  CBC with Differential/Platelet     Status: Abnormal   Collection Time: 01/30/20  8:03 AM  Result Value Ref Range   WBC 5.1 4.0 - 10.5 K/uL   RBC 3.60 (L) 4.22 - 5.81 Mil/uL   Hemoglobin 12.1 (L) 13.0 - 17.0 g/dL   HCT 36.1 (L) 39 - 52 %   MCV 100.2 (H) 78.0 - 100.0 fl    MCHC 33.6 30.0 - 36.0 g/dL   RDW 12.4 11.5 - 15.5 %   Platelets 179.0 150 - 400 K/uL   Neutrophils Relative % 65.8 43 - 77 %   Lymphocytes Relative 20.2 12 - 46 %   Monocytes Relative 10.8 3 - 12 %   Eosinophils Relative 2.6 0 - 5 %   Basophils Relative 0.6 0 - 3 %  Neutro Abs 3.3 1.4 - 7.7 K/uL   Lymphs Abs 1.0 0.7 - 4.0 K/uL   Monocytes Absolute 0.5 0.1 - 1.0 K/uL   Eosinophils Absolute 0.1 0.0 - 0.7 K/uL   Basophils Absolute 0.0 0.0 - 0.1 K/uL  Comprehensive metabolic panel     Status: Abnormal   Collection Time: 01/30/20  8:03 AM  Result Value Ref Range   Sodium 139 135 - 145 mEq/L   Potassium 3.5 3.5 - 5.1 mEq/L   Chloride 103 96 - 112 mEq/L   CO2 27 19 - 32 mEq/L   Glucose, Bld 115 (H) 70 - 99 mg/dL   BUN 17 6 - 23 mg/dL   Creatinine, Ser 0.82 0.40 - 1.50 mg/dL   Total Bilirubin 1.0 0.2 - 1.2 mg/dL   Alkaline Phosphatase 64 39 - 117 U/L   AST 31 0 - 37 U/L   ALT 29 0 - 53 U/L   Total Protein 7.7 6.0 - 8.3 g/dL   Albumin 4.1 3.5 - 5.2 g/dL   GFR 88.74 >60.00 mL/min   Calcium 9.6 8.4 - 10.5 mg/dL   Objective  Body mass index is 26.22 kg/m. Wt Readings from Last 3 Encounters:  02/06/20 167 lb 6.4 oz (75.9 kg)  09/06/19 154 lb (69.9 kg)  08/04/19 154 lb 6.4 oz (70 kg)   Temp Readings from Last 3 Encounters:  02/06/20 97.9 F (36.6 C) (Oral)  08/04/19 (!) 97.3 F (36.3 C) (Temporal)  05/30/19 97.8 F (36.6 C)   BP Readings from Last 3 Encounters:  02/06/20 116/76  09/06/19 110/62  08/04/19 106/70   Pulse Readings from Last 3 Encounters:  02/06/20 89  09/06/19 81  08/04/19 65    Physical Exam Vitals and nursing note reviewed.  Constitutional:      Appearance: Normal appearance. He is well-developed, well-groomed and overweight.  HENT:     Head: Normocephalic and atraumatic.  Eyes:     Conjunctiva/sclera: Conjunctivae normal.     Pupils: Pupils are equal, round, and reactive to light.  Cardiovascular:     Rate and Rhythm: Normal rate and regular  rhythm.     Heart sounds: Normal heart sounds. No murmur heard.   Pulmonary:     Effort: Pulmonary effort is normal.     Breath sounds: Normal breath sounds.  Abdominal:     Tenderness: There is no abdominal tenderness.  Skin:    General: Skin is warm and moist.  Neurological:     General: No focal deficit present.     Mental Status: He is alert and oriented to person, place, and time. Mental status is at baseline.     Gait: Gait normal.  Psychiatric:        Attention and Perception: Attention and perception normal.        Mood and Affect: Mood and affect normal.        Speech: Speech normal.        Behavior: Behavior normal. Behavior is cooperative.        Thought Content: Thought content normal.        Cognition and Memory: Cognition and memory normal.        Judgment: Judgment normal.     Assessment  Plan  Hypertension, unspecified type Cont micardis 40-12.5 controlled   Hyperglycemia - Plan: POCT HgB A1C 4.9   Vitreous floaters of left eye F/u eye md let me know if wants to see one here   Chronic right-sided low back pain with right-sided  sciatica Declines PT, NS for now  Off pain meds   Prostate cancer ([place]) in remission s/p 28 txs radiation  F/u urology Dr. Karsten Ro Rad onc released pt spring 2021  Due repeat PSA urology   HM Get copy of records in future from Dr. Lyman Speller in [place] MD(I.e vaccines utd flu shot 10/21 covid vx 2/2 disc booster 6-8 months  -vaccinesROI sent prev   colonoscopy/EGD repeat [facility] GI utd and UC in remission   Colonoscopy per pt last 09/2018 in [place] pt Had 05/2019 and EGD with IH/H, polyps negative path nodule and gastritis EGD rec repeat EUS and EGD in 3 months per GI   Former smoker quit in 2000s  PCP Dr. Lyman Speller pt agreeable will get copy of colonoscopy, labs and vaccines   Recurrent prostate cancer tx tbd Dr. [facility] and Dr. Tammi Klippel rad on s/p rad x 2 sessions stopped 2  weeks ago from 08/04/19  Pt ok to get records Dr. Lyman Speller  Dr. Stephanie Coup pain clinic appt 06/07/19 Rx percocet 5-325 1/2 po bid prn lyrica 50 mg bid rtc in 3 weeks  He will continue to follow up with urology for ongoing PSA determinations and has an appointment scheduled with Dr. Karsten Ro on 02/12/20 for repeat PSA.  He was seen in the office with Dr. Karsten Ro on 08/22/2019 and had a PSA drawn at that time but has not yet been notified of those results.   Results for ISACC, TURNEY (MRN [id number]) as of 02/06/2020 09:18  Ref. Range 01/19/2019 08:45  PSA Latest Ref Range: 0.10 - 4.00 ng/ml 6.93 (H)   Provider: Dr. Olivia Mackie [name]-Internal Medicine

## 2020-02-06 NOTE — Patient Instructions (Addendum)
Call [facility]rology [facility] Dr. Karsten Ro to see when you are due  Please call your eye doctor and let me know if you need follow up here    Zap floaters are spots in your vision caused by shadows from specks of material that float around inside your eye. This is usually a normal, age-related change in your eye. Floaters may be more obvious when you look up at the sky or at a bright, blank background. Floaters can be annoying, but they do not usually cause vision problems. While floaters may be a normal part of aging, it is important to get an eye exam to make sure that floaters are not a sign of a more serious condition. What are the causes? In most cases, this condition is caused by age-related changes in the eye. As you age, the jelly-like fluid (vitreous) inside the eyeball shrinks and can become stringy. The stringy strands of vitreous cast shadows on the back of the eye (retina), which is where the nerves needed for vision are located. These shadows show up as floaters in your vision. Other possible causes of eye floaters include:  A torn retina.  Injury.  Bleeding inside the eye. Diabetes and other conditions can cause blood vessels in the retina to bleed.  A blood clot in the major vein of the retina or its branches (retinal vein occlusion).  Separation (detachment) of the: ? Retina. ? Vitreous.  Eye inflammation (uveitis).  Eye infection. What increases the risk? You may have a higher risk for floaters if you:  Are older. The risk increases with age.  Are nearsighted.  Have diabetes.  Have had cataracts removed. What are the signs or symptoms? Floaters cause you to see small, shadowy shapes move across your vision. The shapes move as your eyes move. They drift out of your vision when you keep your eyes still. These shapes may look like:  Specks.  Dots.  Circles.  Squiggly lines.  Thread. Sometimes floaters appear along with flashes. Flashes  usually occur at the edge of your vision. They may look like:  Bursts of light.  Flashing lights.  Lightning streaks.  What is commonly referred to as "stars." In some cases, seeing flashes can be a sign of a torn or detached retina, which is a serious condition that requires emergency treatment. How is this diagnosed? Your health care provider may diagnose floaters and flashes based on your symptoms and medical history. An eye care specialist (ophthalmologist) will do an eye exam to determine whether your floaters are a normal part of aging or a warning sign of a more serious eye problem. The specialist may put drops in your eyes to open your pupils wide (dilate) and then use a scope (slit lamp) to look inside your eye. If the specialist is unable to see well enough with a slit lamp, you may need imaging tests such as ultrasound. How is this treated?  If your floaters are the result of aging, you do not need treatment unless they start to affect your vision. Floaters do not go away completely. Most people adapt to the floaters or, over time, the floaters settle below the line of sight.  If a retinal tear or detachment is causing your floaters, you may need surgery.  If you have an underlying condition that is causing floaters, such as an infection, the floaters should go away when that condition is treated.  In rare cases, if the floaters are affecting your vision, surgery  to remove the vitreous and replace it with a saltwater solution (vitrectomy) may be considered. Follow these instructions at home:  Take over-the-counter and prescription medicines only as told by your health care provider.  Do not drive if you have trouble seeing. Ask your health care provider for guidance about when it is and is not safe for you to drive.  Keep all follow-up visits as told by your health care provider. This is important. Contact a health care provider if you:  Have more floaters than  usual.  Develop any new symptoms. Get help right away if:  You cannot see well because of your floaters.  You develop flashes along with floaters.  Your vision suddenly changes, or you lose your vision completely.  It looks as if a curtain is blocking part of your vision. Summary  Eye floaters are spots in your vision caused by specks of material that float around inside your eye.  In most cases, eye floaters are caused by age-related changes in the eye.  Most people do not need treatment for eye floaters unless there is another condition causing the floaters, such as a retinal tear or detachment or an infection. This information is not intended to replace advice given to you by your health care provider. Make sure you discuss any questions you have with your health care provider. Document Revised: 07/28/2018 Document Reviewed: 03/29/2017 Elsevier Patient Education  [employer].

## 2020-02-08 NOTE — Telephone Encounter (Signed)
Left message on machine to call back I have left messages for the pt to return my call to set up procedures.

## 2020-02-08 NOTE — Telephone Encounter (Signed)
Patty, Thanks for the update. Please let let Dr. Algernon Huxley and I know when the patient does decide to get his follow-up scheduled. GM

## 2020-02-09 ENCOUNTER — Other Ambulatory Visit: Payer: Self-pay | Admitting: Gastroenterology

## 2020-02-20 ENCOUNTER — Other Ambulatory Visit: Payer: Self-pay | Admitting: Internal Medicine

## 2020-02-27 ENCOUNTER — Telehealth: Payer: Self-pay | Admitting: Internal Medicine

## 2020-02-27 NOTE — Telephone Encounter (Signed)
Faxed to [facility] for prescription renewal request for Mesalamine tab 1.90m dr take 2 tablet (2.4 grams total) daily with breakfast faxed on 02-27-2020

## 2020-03-04 ENCOUNTER — Other Ambulatory Visit: Payer: Self-pay | Admitting: Gastroenterology

## 2020-03-06 ENCOUNTER — Telehealth: Payer: Self-pay | Admitting: Internal Medicine

## 2020-03-06 DIAGNOSIS — I10 Essential (primary) hypertension: Secondary | ICD-10-CM

## 2020-03-06 MED ORDER — TELMISARTAN-HCTZ 40-12.5 MG PO TABS: 1.0000 | ORAL_TABLET | Freq: Every day | ORAL | 3 refills | Status: DC

## 2020-03-06 NOTE — Telephone Encounter (Signed)
Patient called for refill for telmisartan-hydrochlorothiazide (MICARDIS HCT) 40-12.5 MG tablet

## 2020-03-07 ENCOUNTER — Other Ambulatory Visit: Payer: Self-pay

## 2020-03-07 ENCOUNTER — Telehealth: Payer: Self-pay | Admitting: Gastroenterology

## 2020-03-07 MED ORDER — MESALAMINE 1.2 G PO TBEC: 2.4000 g | DELAYED_RELEASE_TABLET | Freq: Every day | ORAL | 1 refills | Status: DC

## 2020-03-07 NOTE — Telephone Encounter (Signed)
Patient informed and verbalized understanding

## 2020-03-07 NOTE — Telephone Encounter (Signed)
Pt has an appt scheduled for 1/5 he is aware and will keep that appt.

## 2020-03-07 NOTE — Telephone Encounter (Signed)
Patty, Please go ahead and refill the Lialda for the patient and have him come in for a clinic.  Can get a few months of therapy/refills until he is seen by me. Lialda 2.4 g daily, unless he has gone up in his dosing. Thank you. GM

## 2020-03-07 NOTE — Telephone Encounter (Signed)
mesalamine (LIALDA) 1.2 g EC tablet  Patient calling in requesting refills on the above medication as well.   Patient states he no longer sees the GI doctor that prescribes this for him and has not established with anyone else.   Please advise

## 2020-03-07 NOTE — Telephone Encounter (Signed)
Refill sent to the pharmacy pt aware

## 2020-03-07 NOTE — Telephone Encounter (Signed)
Pt needs refill

## 2020-03-08 MED ORDER — MESALAMINE 1.2 G PO TBEC: 2.4000 g | DELAYED_RELEASE_TABLET | Freq: Every day | ORAL | 1 refills | Status: DC

## 2020-03-08 NOTE — Telephone Encounter (Signed)
Refill sent- pt informed.

## 2020-03-12 NOTE — H&P (View-Only) (Signed)
Follow-up Outpatient Visit Date: 03/13/2020  Primary Care Provider: [name], [name], MD [place] [id number]  Chief Complaint: Chest pain and shortness of breath  HPI:  [name] is a 69 y.o. male with history of coronary artery disease status post multiple PCI's, peripheral vascular disease status post bilateral lower extremity stenting followed by Dr. Scot Dock, ulcerative colitis, hypertension, hyperlipidemia, hepatitis C, prostate cancer, and BPH, who presents for follow-up of CAD.  He was last seen in May by Christell Faith, PA.  At that time he reported stable exertional dyspnea.  He denied angina and claudication.  No medication changes or additional testing were recommended.  Today, [name] reports that he has been feeling more frequent shortness of breath and chest heaviness with activity.  If he walks 100 feet at anything greater than a slow pace, he feels as though an elephant is sitting on his chest.  Sometimes he has to stop to bend over, with the dyspnea and pain resolving quite quickly.  He has not used sublingual nitroglycerin at times with relief.  However, he feels like prednisone also helps his symptoms.  He denies chest pain at rest or when walking slowly.  However, the symptoms have worsened over the last 6 months and are reminiscent to what he felt prior to stent placements in the past (he reports a history of 7 coronary and 3 lower extremity stents placed while living near [place], [place]).  [name] has not had any palpitations, lightheadedness, or edema.  He is only taking clopidogrel every other day, as he is concerned that daily dosing may cause him to have bleeding.  He otherwise remains compliant with his medications.  His mobility is also somewhat limited due to weakness and pain in his right leg that he attributes to chronic low back problems.  He has completed treatment for prostate  cancer.  --------------------------------------------------------------------------------------------------  Cardiovascular History & Procedures: Cardiovascular Problems:  Coronary artery disease  Peripheral vascular disease  Risk Factors:  Known CAD and PAD, hypertension, hyperlipidemia, male gender, and age greater than 64  Cath/PCI:  RLE PTA (02/19/2014): Right mid and distal SFA laser atherectomy and PTA/stenting.  Runoff (02/01/2014): Severe right SFA stenosis with 2-vessel runoff.  Abdominal aortogran and runoff/PTA (04/26/2012): Right SFA stenting and left SFA PTA with DCB  LHC (02/25/2012): LMCA, LAD, and LCx "without any obtructive lesions). RCA with 40% mid vessel stenosis. Proximal and mid RCA stents patent. LVEF 60%  Abdominal aortogram and runoff (02/25/2012): 95% bilateral SFA stenoses.  CV Surgery:  None  EP Procedures and Devices:  None  Non-Invasive Evaluation(s):  ABIs (05/17/2019): Normal ABIs and TBI's bilaterally.  Myocardial perfusion stress test (03/07/2019): Normal study without ischemia or scar.  LVEF 56%.  TTE (03/02/2019): Normal LV size and wall thickness.  LVEF 60-65% with grade 1 diastolic dysfunction.  Normal RV size and function.  No significant valvular abnormality.  ABIs and bilateral lower extremity Doppler (03/02/2019): ABIs normal (1.1 bilaterally).  Bilateral SFA stents are patent with mild stenosis in the midsegment of the right SFA stent.  Abdominal aorta duplex (07/22/2016): No AAA. Maximal aortic diameter 2.8 cm.  TTE (07/22/2016): Normal LV size with mild LVH and grade 1 diastolic dysfunction. LVEF 65%. Trace aortic and mitral regurgitation.  Pharmacologic MPI (07/21/2016): Normal myocardial perfusion without ischemia or scar.  Renal artery duplex (02/19/2012): Normal without evidence of renal artery stenosis  Recent CV Pertinent Labs: Lab Results  Component Value Date   CHOL  126 01/30/2020   HDL 58.80 01/30/2020    LDLCALC 45 01/30/2020   TRIG 113.0 01/30/2020   CHOLHDL 2 01/30/2020   INR 1.1 (H) 12/22/2018   K 3.5 01/30/2020   BUN 17 01/30/2020   CREATININE 0.82 01/30/2020    Past medical and surgical history were reviewed and updated in EPIC.  Current Meds  Medication Sig  . atorvastatin (LIPITOR) 20 MG tablet TAKE 1 TABLET (20 MG TOTAL) BY MOUTH DAILY AT 6 PM.  . clopidogrel (PLAVIX) 75 MG tablet Take 1 tablet (75 mg total) by mouth daily.  . cyclobenzaprine (FLEXERIL) 5 MG tablet Take 1-2 tablets (5-10 mg total) by mouth at bedtime as needed for muscle spasms.  . famotidine (PEPCID) 40 MG tablet TAKE 1 TABLET BY MOUTH TWICE A DAY  . hyoscyamine (LEVBID) 0.375 MG 12 hr tablet TAKE 1 TABLET (0.375 MG TOTAL) BY MOUTH 2 (TWO) TIMES DAILY.  . isosorbide mononitrate (IMDUR) 30 MG 24 hr tablet TAKE 1 TABLET BY MOUTH EVERY DAY  . lidocaine (LIDODERM) 5 %   . mesalamine (LIALDA) 1.2 g EC tablet Take 2 tablets (2.4 g total) by mouth daily with breakfast.  . metoprolol succinate (TOPROL-XL) 50 MG 24 hr tablet Take 1 tablet (50 mg total) by mouth daily.  . nitroGLYCERIN (NITROSTAT) 0.4 MG SL tablet Place 1 tablet (0.4 mg total) under the tongue every 5 (five) minutes as needed for chest pain. If taking 2 call 911  . omeprazole (PRILOSEC) 40 MG capsule Take 1 capsule (40 mg total) by mouth daily.  . RESTASIS 0.05 % ophthalmic emulsion Place 1 drop into both eyes 2 (two) times daily.   . tamsulosin (FLOMAX) 0.4 MG CAPS capsule Take 0.4 mg by mouth daily.  Marland Kitchen telmisartan-hydrochlorothiazide (MICARDIS HCT) 40-12.5 MG tablet Take 1 tablet by mouth daily.    Allergies: Cymbalta [duloxetine hcl]  Social History   Tobacco Use  . Smoking status: Former Smoker    Packs/day: 1.00    Years: 40.00    Pack years: 40.00    Types: Cigarettes    Quit date: 04/20/1998    Years since quitting: 21.9  . Smokeless tobacco: Never Used  . Tobacco comment: quit in 2000s   Vaping Use  . Vaping Use: Never used   Substance Use Topics  . Alcohol use: Yes    Comment: occasional  . Drug use: Not Currently    Family History  Problem Relation Age of Onset  . Heart disease Mother   . Stroke Mother   . Breast cancer Mother   . Diabetes Sister   . Diabetes Brother   . Colon cancer Neg Hx   . Esophageal cancer Neg Hx   . Inflammatory bowel disease Neg Hx   . Liver disease Neg Hx   . Pancreatic cancer Neg Hx   . Rectal cancer Neg Hx   . Stomach cancer Neg Hx     Review of Systems: A 12-system review of systems was performed and was negative except as noted in the HPI.  --------------------------------------------------------------------------------------------------  Physical Exam: BP 140/84 (BP Location: Left Arm, Patient Position: Sitting, Cuff Size: Normal)   Pulse 84   Ht 5' 7"  (1.702 m)   Wt 168 lb 4 oz (76.3 kg)   SpO2 99%   BMI 26.35 kg/m   General: NAD. Neck: No JVD or HJR. Lungs: Clear to auscultation bilaterally without wheezes or crackles. Heart: Regular rate and rhythm without murmurs, rubs, or gallops. Abdomen: Soft, nontender, nondistended. Extremities: No  lower extremity edema.  EKG: Normal sinus rhythm with inferior infarct.  Compared with prior tracing from 09/06/2019, inferior infarct is now present.  Lab Results  Component Value Date   WBC 5.1 01/30/2020   HGB 12.1 (L) 01/30/2020   HCT 36.1 (L) 01/30/2020   MCV 100.2 (H) 01/30/2020   PLT 179.0 01/30/2020    Lab Results  Component Value Date   NA 139 01/30/2020   K 3.5 01/30/2020   CL 103 01/30/2020   CO2 27 01/30/2020   BUN 17 01/30/2020   CREATININE 0.82 01/30/2020   GLUCOSE 115 (H) 01/30/2020   ALT 29 01/30/2020    Lab Results  Component Value Date   CHOL 126 01/30/2020   HDL 58.80 01/30/2020   LDLCALC 45 01/30/2020   TRIG 113.0 01/30/2020   CHOLHDL 2 01/30/2020    --------------------------------------------------------------------------------------------------  ASSESSMENT AND  PLAN: Accelerating angina: [name] has a history of multiple prior PCI's and expresses increasing exertional dyspnea and chest pressure reminiscent of what he has felt in the past before needing stents. Though he had a low risk myocardial perfusion stress test a year ago, I am concerned that his worsening symptoms may reflect progression of ischemic heart disease. I have recommended that we proceed with left heart catheterization and possible PCI at his earliest convenience, which he is in agreement with.  I have reviewed the risks, indications, and alternatives to cardiac catheterization, possible angioplasty, and stenting with the patient. Risks include but are not limited to bleeding, infection, vascular injury, stroke, myocardial infection, arrhythmia, kidney injury, radiation-related injury in the case of prolonged fluoroscopy use, emergency cardiac surgery, and death. The patient understands the risks of serious complication is 1-2 in 4163 with diagnostic cardiac cath and 1-2% or less with angioplasty/stenting. In the meantime, we will increase metoprolol succinate to 100 mg daily. Other medications including, clopidogrel, should be continued. I advised [name] to seek immediate medical attention should he have worsening chest pain.  PAD: Mobility remains limited due to chronic right leg pain weakness that [name] attributes to his back pain. ABIs last year and vascular surgery evaluation with Dr. Doren Custard were unrevealing. Continue current medications for secondary prevention. No further work-up at this time.  Hyperlipidemia: Lipids well controlled on last check a month ago. Continue atorvastatin 20 mg daily.  Hypertension: Blood pressure mildly elevated. We will increase metoprolol succinate to 100 mg daily. Continue current doses of isosorbide mononitrate and telmisartan-HCTZ.  Follow-up: Return to clinic approximately 2 weeks after catheterization.  Nelva Bush,  MD 03/13/2020 11:50 AM

## 2020-03-12 NOTE — Progress Notes (Signed)
Follow-up Outpatient Visit Date: 03/13/2020  Primary Care Provider: [name], [name], MD [place] [id number]  Chief Complaint: Chest pain and shortness of breath  HPI:  [name] is a 69 y.o. male with history of coronary artery disease status post multiple PCI's, peripheral vascular disease status post bilateral lower extremity stenting followed by Dr. Scot Dock, ulcerative colitis, hypertension, hyperlipidemia, hepatitis C, prostate cancer, and BPH, who presents for follow-up of CAD.  He was last seen in May by Christell Faith, PA.  At that time he reported stable exertional dyspnea.  He denied angina and claudication.  No medication changes or additional testing were recommended.  Today, [name] reports that he has been feeling more frequent shortness of breath and chest heaviness with activity.  If he walks 100 feet at anything greater than a slow pace, he feels as though an elephant is sitting on his chest.  Sometimes he has to stop to bend over, with the dyspnea and pain resolving quite quickly.  He has not used sublingual nitroglycerin at times with relief.  However, he feels like prednisone also helps his symptoms.  He denies chest pain at rest or when walking slowly.  However, the symptoms have worsened over the last 6 months and are reminiscent to what he felt prior to stent placements in the past (he reports a history of 7 coronary and 3 lower extremity stents placed while living near [place], [place]).  [name] has not had any palpitations, lightheadedness, or edema.  He is only taking clopidogrel every other day, as he is concerned that daily dosing may cause him to have bleeding.  He otherwise remains compliant with his medications.  His mobility is also somewhat limited due to weakness and pain in his right leg that he attributes to chronic low back problems.  He has completed treatment for prostate  cancer.  --------------------------------------------------------------------------------------------------  Cardiovascular History & Procedures: Cardiovascular Problems:  Coronary artery disease  Peripheral vascular disease  Risk Factors:  Known CAD and PAD, hypertension, hyperlipidemia, male gender, and age greater than 90  Cath/PCI:  RLE PTA (02/19/2014): Right mid and distal SFA laser atherectomy and PTA/stenting.  Runoff (02/01/2014): Severe right SFA stenosis with 2-vessel runoff.  Abdominal aortogran and runoff/PTA (04/26/2012): Right SFA stenting and left SFA PTA with DCB  LHC (02/25/2012): LMCA, LAD, and LCx "without any obtructive lesions). RCA with 40% mid vessel stenosis. Proximal and mid RCA stents patent. LVEF 60%  Abdominal aortogram and runoff (02/25/2012): 95% bilateral SFA stenoses.  CV Surgery:  None  EP Procedures and Devices:  None  Non-Invasive Evaluation(s):  ABIs (05/17/2019): Normal ABIs and TBI's bilaterally.  Myocardial perfusion stress test (03/07/2019): Normal study without ischemia or scar.  LVEF 56%.  TTE (03/02/2019): Normal LV size and wall thickness.  LVEF 60-65% with grade 1 diastolic dysfunction.  Normal RV size and function.  No significant valvular abnormality.  ABIs and bilateral lower extremity Doppler (03/02/2019): ABIs normal (1.1 bilaterally).  Bilateral SFA stents are patent with mild stenosis in the midsegment of the right SFA stent.  Abdominal aorta duplex (07/22/2016): No AAA. Maximal aortic diameter 2.8 cm.  TTE (07/22/2016): Normal LV size with mild LVH and grade 1 diastolic dysfunction. LVEF 65%. Trace aortic and mitral regurgitation.  Pharmacologic MPI (07/21/2016): Normal myocardial perfusion without ischemia or scar.  Renal artery duplex (02/19/2012): Normal without evidence of renal artery stenosis  Recent CV Pertinent Labs: Lab Results  Component Value Date   CHOL  126 01/30/2020   HDL 58.80 01/30/2020    LDLCALC 45 01/30/2020   TRIG 113.0 01/30/2020   CHOLHDL 2 01/30/2020   INR 1.1 (H) 12/22/2018   K 3.5 01/30/2020   BUN 17 01/30/2020   CREATININE 0.82 01/30/2020    Past medical and surgical history were reviewed and updated in EPIC.  Current Meds  Medication Sig  . atorvastatin (LIPITOR) 20 MG tablet TAKE 1 TABLET (20 MG TOTAL) BY MOUTH DAILY AT 6 PM.  . clopidogrel (PLAVIX) 75 MG tablet Take 1 tablet (75 mg total) by mouth daily.  . cyclobenzaprine (FLEXERIL) 5 MG tablet Take 1-2 tablets (5-10 mg total) by mouth at bedtime as needed for muscle spasms.  . famotidine (PEPCID) 40 MG tablet TAKE 1 TABLET BY MOUTH TWICE A DAY  . hyoscyamine (LEVBID) 0.375 MG 12 hr tablet TAKE 1 TABLET (0.375 MG TOTAL) BY MOUTH 2 (TWO) TIMES DAILY.  . isosorbide mononitrate (IMDUR) 30 MG 24 hr tablet TAKE 1 TABLET BY MOUTH EVERY DAY  . lidocaine (LIDODERM) 5 %   . mesalamine (LIALDA) 1.2 g EC tablet Take 2 tablets (2.4 g total) by mouth daily with breakfast.  . metoprolol succinate (TOPROL-XL) 50 MG 24 hr tablet Take 1 tablet (50 mg total) by mouth daily.  . nitroGLYCERIN (NITROSTAT) 0.4 MG SL tablet Place 1 tablet (0.4 mg total) under the tongue every 5 (five) minutes as needed for chest pain. If taking 2 call 911  . omeprazole (PRILOSEC) 40 MG capsule Take 1 capsule (40 mg total) by mouth daily.  . RESTASIS 0.05 % ophthalmic emulsion Place 1 drop into both eyes 2 (two) times daily.   . tamsulosin (FLOMAX) 0.4 MG CAPS capsule Take 0.4 mg by mouth daily.  Marland Kitchen telmisartan-hydrochlorothiazide (MICARDIS HCT) 40-12.5 MG tablet Take 1 tablet by mouth daily.    Allergies: Cymbalta [duloxetine hcl]  Social History   Tobacco Use  . Smoking status: Former Smoker    Packs/day: 1.00    Years: 40.00    Pack years: 40.00    Types: Cigarettes    Quit date: 04/20/1998    Years since quitting: 21.9  . Smokeless tobacco: Never Used  . Tobacco comment: quit in 2000s   Vaping Use  . Vaping Use: Never used   Substance Use Topics  . Alcohol use: Yes    Comment: occasional  . Drug use: Not Currently    Family History  Problem Relation Age of Onset  . Heart disease Mother   . Stroke Mother   . Breast cancer Mother   . Diabetes Sister   . Diabetes Brother   . Colon cancer Neg Hx   . Esophageal cancer Neg Hx   . Inflammatory bowel disease Neg Hx   . Liver disease Neg Hx   . Pancreatic cancer Neg Hx   . Rectal cancer Neg Hx   . Stomach cancer Neg Hx     Review of Systems: A 12-system review of systems was performed and was negative except as noted in the HPI.  --------------------------------------------------------------------------------------------------  Physical Exam: BP 140/84 (BP Location: Left Arm, Patient Position: Sitting, Cuff Size: Normal)   Pulse 84   Ht 5' 7"  (1.702 m)   Wt 168 lb 4 oz (76.3 kg)   SpO2 99%   BMI 26.35 kg/m   General: NAD. Neck: No JVD or HJR. Lungs: Clear to auscultation bilaterally without wheezes or crackles. Heart: Regular rate and rhythm without murmurs, rubs, or gallops. Abdomen: Soft, nontender, nondistended. Extremities: No  lower extremity edema.  EKG: Normal sinus rhythm with inferior infarct.  Compared with prior tracing from 09/06/2019, inferior infarct is now present.  Lab Results  Component Value Date   WBC 5.1 01/30/2020   HGB 12.1 (L) 01/30/2020   HCT 36.1 (L) 01/30/2020   MCV 100.2 (H) 01/30/2020   PLT 179.0 01/30/2020    Lab Results  Component Value Date   NA 139 01/30/2020   K 3.5 01/30/2020   CL 103 01/30/2020   CO2 27 01/30/2020   BUN 17 01/30/2020   CREATININE 0.82 01/30/2020   GLUCOSE 115 (H) 01/30/2020   ALT 29 01/30/2020    Lab Results  Component Value Date   CHOL 126 01/30/2020   HDL 58.80 01/30/2020   LDLCALC 45 01/30/2020   TRIG 113.0 01/30/2020   CHOLHDL 2 01/30/2020    --------------------------------------------------------------------------------------------------  ASSESSMENT AND  PLAN: Accelerating angina: Mr. Dannette Barbara has a history of multiple prior PCI's and expresses increasing exertional dyspnea and chest pressure reminiscent of what he has felt in the past before needing stents. Though he had a low risk myocardial perfusion stress test a year ago, I am concerned that his worsening symptoms may reflect progression of ischemic heart disease. I have recommended that we proceed with left heart catheterization and possible PCI at his earliest convenience, which he is in agreement with.  I have reviewed the risks, indications, and alternatives to cardiac catheterization, possible angioplasty, and stenting with the patient. Risks include but are not limited to bleeding, infection, vascular injury, stroke, myocardial infection, arrhythmia, kidney injury, radiation-related injury in the case of prolonged fluoroscopy use, emergency cardiac surgery, and death. The patient understands the risks of serious complication is 1-2 in 5277 with diagnostic cardiac cath and 1-2% or less with angioplasty/stenting. In the meantime, we will increase metoprolol succinate to 100 mg daily. Other medications including, clopidogrel, should be continued. I advised Mr. Dannette Barbara to seek immediate medical attention should he have worsening chest pain.  PAD: Mobility remains limited due to chronic right leg pain weakness that Mr. Dannette Barbara attributes to his back pain. ABIs last year and vascular surgery evaluation with Dr. Doren Custard were unrevealing. Continue current medications for secondary prevention. No further work-up at this time.  Hyperlipidemia: Lipids well controlled on last check a month ago. Continue atorvastatin 20 mg daily.  Hypertension: Blood pressure mildly elevated. We will increase metoprolol succinate to 100 mg daily. Continue current doses of isosorbide mononitrate and telmisartan-HCTZ.  Follow-up: Return to clinic approximately 2 weeks after catheterization.  Nelva Bush,  MD 03/13/2020 11:50 AM

## 2020-03-13 ENCOUNTER — Other Ambulatory Visit: Payer: Self-pay

## 2020-03-13 ENCOUNTER — Ambulatory Visit (INDEPENDENT_AMBULATORY_CARE_PROVIDER_SITE_OTHER): Payer: Medicare Other | Admitting: Internal Medicine

## 2020-03-13 ENCOUNTER — Encounter: Payer: Self-pay | Admitting: Internal Medicine

## 2020-03-13 VITALS — BP 140/84 | HR 84 | Ht 67.0 in | Wt 168.2 lb

## 2020-03-13 DIAGNOSIS — I200 Unstable angina: Secondary | ICD-10-CM

## 2020-03-13 DIAGNOSIS — I739 Peripheral vascular disease, unspecified: Secondary | ICD-10-CM | POA: Diagnosis not present

## 2020-03-13 DIAGNOSIS — I10 Essential (primary) hypertension: Secondary | ICD-10-CM

## 2020-03-13 DIAGNOSIS — E785 Hyperlipidemia, unspecified: Secondary | ICD-10-CM

## 2020-03-13 MED ORDER — METOPROLOL SUCCINATE ER 100 MG PO TB24: 100.0000 mg | ORAL_TABLET | Freq: Every day | ORAL | 5 refills | Status: DC

## 2020-03-13 NOTE — Patient Instructions (Signed)
Medication Instructions:   Your physician has recommended you make the following change in your medication:   1.  INCREASE your Metoprolol to 100 MG: Take 1 tab by mouth daily.  *If you need a refill on your cardiac medications before your next appointment, please call your pharmacy*   Lab Work: BMP and CBC was drawn today. If you have labs (blood work) drawn today and your tests are completely normal, you will receive your results only by: Marland Kitchen MyChart Message (if you have MyChart) OR . A paper copy in the mail If you have any lab test that is abnormal or we need to change your treatment, we will call you to review the results.   Testing/Procedures:     [facility] [address] [place] [address] Dept: [phone] Loc: [place]  03/13/2020  You are scheduled for a Cardiac Catheterization on Thursday, December 2 with Dr. Harrell Gave End.  1. Please arrive at the [facility] (Main Entrance A) at [facility]: [place], [place] [address] at 8:30 AM (This time is two hours before your procedure to ensure your preparation). Free valet parking service is available.   Special note: Every effort is made to have your procedure done on time. Please understand that emergencies sometimes delay scheduled procedures.  2. Diet: Do not eat solid foods after midnight.  The patient may have clear liquids until 5am upon the day of the procedure.  3. Medication instructions in preparation for your procedure:   Contrast Allergy: No    Current Outpatient Medications (Cardiovascular):  .  atorvastatin (LIPITOR) 20 MG tablet, TAKE 1 TABLET (20 MG TOTAL) BY MOUTH DAILY AT 6 PM. .  isosorbide mononitrate (IMDUR) 30 MG 24 hr tablet, TAKE 1 TABLET BY MOUTH EVERY DAY .  metoprolol succinate (TOPROL-XL) 50 MG 24 hr tablet, Take 1 tablet (50 mg total) by mouth  daily. .  nitroGLYCERIN (NITROSTAT) 0.4 MG SL tablet, Place 1 tablet (0.4 mg total) under the tongue every 5 (five) minutes as needed for chest pain. If taking 2 call 911 .  telmisartan-hydrochlorothiazide (MICARDIS HCT) 40-12.5 MG tablet, Take 1 tablet by mouth daily.    Current Outpatient Medications (Hematological):  .  clopidogrel (PLAVIX) 75 MG tablet, Take 1 tablet (75 mg total) by mouth daily.  Current Outpatient Medications (Other):  .  cyclobenzaprine (FLEXERIL) 5 MG tablet, Take 1-2 tablets (5-10 mg total) by mouth at bedtime as needed for muscle spasms. .  famotidine (PEPCID) 40 MG tablet, TAKE 1 TABLET BY MOUTH TWICE A DAY .  hyoscyamine (LEVBID) 0.375 MG 12 hr tablet, TAKE 1 TABLET (0.375 MG TOTAL) BY MOUTH 2 (TWO) TIMES DAILY. Marland Kitchen  lidocaine (LIDODERM) 5 %,  .  mesalamine (LIALDA) 1.2 g EC tablet, Take 2 tablets (2.4 g total) by mouth daily with breakfast. .  omeprazole (PRILOSEC) 40 MG capsule, Take 1 capsule (40 mg total) by mouth daily. .  RESTASIS 0.05 % ophthalmic emulsion, Place 1 drop into both eyes 2 (two) times daily.  .  tamsulosin (FLOMAX) 0.4 MG CAPS capsule, Take 0.4 mg by mouth daily. *For reference purposes while preparing patient instructions.   Delete this med list prior to printing instructions for patient.*   Stop taking, HTCZ (Hydrochlorothiazide) Thursday, December 2,    On the morning of your procedure, take your Plavix/Clopidogrel and any morning medicines NOT listed above.  You may use sips  of water.  5. Plan for one night stay--bring personal belongings. 6. Bring a current list of your medications and current insurance cards. 7. You MUST have a responsible person to drive you home. 8. Someone MUST be with you the first 24 hours after you arrive home or your discharge will be delayed. 9. Please wear clothes that are easy to get on and off and wear slip-on shoes.  Thank you for allowing [place] to care for you!   -- [facility]  [facility]    Follow-Up: At [facility], you and your health needs are our priority.  As part of our continuing mission to provide you with exceptional heart care, we have created designated Provider Care Teams.  These Care Teams include your primary Cardiologist (physician) and Advanced Practice Providers (APPs -  Physician Assistants and Nurse Practitioners) who all work together to provide you with the care you need, when you need it.  We recommend signing up for the patient portal called "MyChart".  Sign up information is provided on this After Visit Summary.  MyChart is used to connect with patients for Virtual Visits (Telemedicine).  Patients are able to view lab/test results, encounter notes, upcoming appointments, etc.  Non-urgent messages can be sent to your provider as well.   To learn more about what you can do with MyChart, go to [email or web address].    Your next appointment:   Follow up 1-2 weeks after Cath Lab on 03/21/20   The format for your next appointment:   In Person  Provider:   You may see Nelva Bush, MD or one of the following Advanced Practice Providers on your designated Care Team:    Murray Hodgkins, NP  Christell Faith, PA-C  Marrianne Mood, PA-C  Cadence [place], [place]  Laurann [place], NP    Other Instructions

## 2020-03-14 LAB — BASIC METABOLIC PANEL
BUN/Creatinine Ratio: 22 (ref 10–24)
BUN: 20 mg/dL (ref 8–27)
CO2: 24 mmol/L (ref 20–29)
Calcium: 9.9 mg/dL (ref 8.6–10.2)
Chloride: 102 mmol/L (ref 96–106)
Creatinine, Ser: 0.93 mg/dL (ref 0.76–1.27)
GFR calc Af Amer: 95 mL/min/{1.73_m2} (ref >59)
GFR calc non Af Amer: 82 mL/min/{1.73_m2} (ref >59)
Glucose: 103 mg/dL — ABNORMAL HIGH (ref 65–99)
Potassium: 3.8 mmol/L (ref 3.5–5.2)
Sodium: 140 mmol/L (ref 134–144)

## 2020-03-14 LAB — CBC
Hematocrit: 38.1 % (ref 37.5–51.0)
Hemoglobin: 12.9 g/dL — ABNORMAL LOW (ref 13.0–17.7)
MCH: 32.7 pg (ref 26.6–33.0)
MCHC: 33.9 g/dL (ref 31.5–35.7)
MCV: 97 fL (ref 79–97)
Platelets: 199 10*3/uL (ref 150–450)
RBC: 3.94 x10E6/uL — ABNORMAL LOW (ref 4.14–5.80)
RDW: 11.6 % (ref 11.6–15.4)
WBC: 5.8 10*3/uL (ref 3.4–10.8)

## 2020-03-15 ENCOUNTER — Encounter: Payer: Self-pay | Admitting: Internal Medicine

## 2020-03-15 DIAGNOSIS — I200 Unstable angina: Secondary | ICD-10-CM | POA: Insufficient documentation

## 2020-03-15 HISTORY — DX: Unstable angina: I20.0

## 2020-03-19 ENCOUNTER — Other Ambulatory Visit
Admission: RE | Admit: 2020-03-19 | Discharge: 2020-03-19 | Disposition: A | Discharge status: Home / Self Care | Payer: Medicare Other | Source: Ambulatory Visit | Attending: Orthopedic Surgery | Admitting: Orthopedic Surgery

## 2020-03-19 ENCOUNTER — Other Ambulatory Visit: Payer: Self-pay

## 2020-03-19 DIAGNOSIS — Z20822 Contact with and (suspected) exposure to covid-19: Secondary | ICD-10-CM | POA: Insufficient documentation

## 2020-03-19 DIAGNOSIS — Z01812 Encounter for preprocedural laboratory examination: Secondary | ICD-10-CM | POA: Diagnosis present

## 2020-03-19 LAB — SARS CORONAVIRUS 2 (TAT 6-24 HRS): SARS Coronavirus 2: NEGATIVE

## 2020-03-20 ENCOUNTER — Telehealth: Payer: Self-pay | Admitting: *Deleted

## 2020-03-20 NOTE — Telephone Encounter (Addendum)
Pt contacted pre-catheterization scheduled at [facility] for: Thursday March 21, 2020 10:30 AM Verified arrival time and place: [facility]) at: 8:30 AM   No solid food after midnight prior to cath, clear liquids until 5 AM day of procedure.  Hold: Telmisartan-HCT-AM of procedure  Except hold medications AM meds can be  taken pre-cath with sips of water including: ASA 81 mg Plavix 75 mg  Confirmed patient has responsible adult to drive home post procedure and be with patient first 24 hours after arriving home: yes  You are allowed ONE visitor in the waiting room during the time you are at the hospital for your procedure. Both you and your visitor must wear a mask once you enter the hospital.       COVID-19 Pre-Screening Questions:  . In the past 14 days have you had any symptoms concerning for COVID-19 infection (fever, chills, cough, or new shortness of breath)? no . In the past 14 days have you been around anyone with known Covid 19? no    Reviewed procedure/mask/visitor instructions, COVID-19 questions with patient.

## 2020-03-21 ENCOUNTER — Encounter (HOSPITAL_COMMUNITY)
Admission: RE | Disposition: A | Discharge status: Home / Self Care | Payer: Self-pay | Source: Home / Self Care | Attending: Internal Medicine

## 2020-03-21 ENCOUNTER — Ambulatory Visit (HOSPITAL_COMMUNITY)
Admission: RE | Admit: 2020-03-21 | Discharge: 2020-03-21 | Disposition: A | Discharge status: Home / Self Care | Payer: Medicare Other | Attending: Internal Medicine | Admitting: Internal Medicine

## 2020-03-21 ENCOUNTER — Other Ambulatory Visit: Payer: Self-pay

## 2020-03-21 DIAGNOSIS — R1084 Generalized abdominal pain: Secondary | ICD-10-CM

## 2020-03-21 DIAGNOSIS — E785 Hyperlipidemia, unspecified: Secondary | ICD-10-CM | POA: Diagnosis not present

## 2020-03-21 DIAGNOSIS — D128 Benign neoplasm of rectum: Secondary | ICD-10-CM

## 2020-03-21 DIAGNOSIS — Z888 Allergy status to other drugs, medicaments and biological substances status: Secondary | ICD-10-CM | POA: Insufficient documentation

## 2020-03-21 DIAGNOSIS — K5180 Other ulcerative colitis without complications: Secondary | ICD-10-CM

## 2020-03-21 DIAGNOSIS — Z7902 Long term (current) use of antithrombotics/antiplatelets: Secondary | ICD-10-CM | POA: Insufficient documentation

## 2020-03-21 DIAGNOSIS — Z955 Presence of coronary angioplasty implant and graft: Secondary | ICD-10-CM | POA: Insufficient documentation

## 2020-03-21 DIAGNOSIS — I739 Peripheral vascular disease, unspecified: Secondary | ICD-10-CM | POA: Diagnosis not present

## 2020-03-21 DIAGNOSIS — Z79899 Other long term (current) drug therapy: Secondary | ICD-10-CM | POA: Insufficient documentation

## 2020-03-21 DIAGNOSIS — Z87891 Personal history of nicotine dependence: Secondary | ICD-10-CM | POA: Insufficient documentation

## 2020-03-21 DIAGNOSIS — I10 Essential (primary) hypertension: Secondary | ICD-10-CM | POA: Insufficient documentation

## 2020-03-21 DIAGNOSIS — I25110 Atherosclerotic heart disease of native coronary artery with unstable angina pectoris: Secondary | ICD-10-CM | POA: Diagnosis present

## 2020-03-21 DIAGNOSIS — D129 Benign neoplasm of anus and anal canal: Secondary | ICD-10-CM

## 2020-03-21 DIAGNOSIS — I200 Unstable angina: Secondary | ICD-10-CM | POA: Diagnosis present

## 2020-03-21 DIAGNOSIS — D120 Benign neoplasm of cecum: Secondary | ICD-10-CM

## 2020-03-21 HISTORY — PX: CORONARY PRESSURE/FFR STUDY: CATH118243

## 2020-03-21 HISTORY — PX: CORONARY BALLOON ANGIOPLASTY: CATH118233

## 2020-03-21 HISTORY — PX: LEFT HEART CATH AND CORONARY ANGIOGRAPHY: CATH118249

## 2020-03-21 LAB — POCT ACTIVATED CLOTTING TIME
Activated Clotting Time: 267 seconds
Activated Clotting Time: 285 seconds

## 2020-03-21 SURGERY — LEFT HEART CATH AND CORONARY ANGIOGRAPHY
Anesthesia: LOCAL

## 2020-03-21 MED ORDER — SODIUM CHLORIDE 0.9 % WEIGHT BASED INFUSION: 1.0000 mL/kg/h | INTRAVENOUS | Status: DC

## 2020-03-21 MED ORDER — ADENOSINE 12 MG/4ML IV SOLN
INTRAVENOUS | Status: AC
  Filled 2020-03-21: qty 16

## 2020-03-21 MED ORDER — LIDOCAINE HCL (PF) 1 % IJ SOLN
INTRAMUSCULAR | Status: DC | PRN
  Administered 2020-03-21: 2 mL

## 2020-03-21 MED ORDER — HEPARIN SODIUM (PORCINE) 1000 UNIT/ML IJ SOLN
INTRAMUSCULAR | Status: DC | PRN
  Administered 2020-03-21: 3000 [IU] via INTRAVENOUS
  Administered 2020-03-21 (×2): 4000 [IU] via INTRAVENOUS

## 2020-03-21 MED ORDER — PANTOPRAZOLE SODIUM 40 MG PO TBEC: 40.0000 mg | DELAYED_RELEASE_TABLET | Freq: Every day | ORAL | 1 refills | Status: DC

## 2020-03-21 MED ORDER — SODIUM CHLORIDE 0.9% FLUSH: 3.0000 mL | INTRAVENOUS | Status: DC | PRN

## 2020-03-21 MED ORDER — CLOPIDOGREL BISULFATE 300 MG PO TABS
ORAL_TABLET | ORAL | Status: DC | PRN
  Administered 2020-03-21: 300 mg via ORAL

## 2020-03-21 MED ORDER — CLOPIDOGREL BISULFATE 75 MG PO TABS: 75.0000 mg | ORAL_TABLET | Freq: Every day | ORAL | Status: DC

## 2020-03-21 MED ORDER — SODIUM CHLORIDE 0.9% FLUSH: 3.0000 mL | Freq: Two times a day (BID) | INTRAVENOUS | Status: DC

## 2020-03-21 MED ORDER — ASPIRIN 81 MG PO CHEW: 81.0000 mg | CHEWABLE_TABLET | Freq: Every day | ORAL | Status: DC

## 2020-03-21 MED ORDER — VERAPAMIL HCL 2.5 MG/ML IV SOLN
INTRAVENOUS | Status: DC | PRN
  Administered 2020-03-21: 10 mL via INTRA_ARTERIAL

## 2020-03-21 MED ORDER — HEPARIN SODIUM (PORCINE) 1000 UNIT/ML IJ SOLN
INTRAMUSCULAR | Status: AC
  Filled 2020-03-21: qty 1

## 2020-03-21 MED ORDER — SODIUM CHLORIDE 0.9 % IV SOLN: 250.0000 mL | INTRAVENOUS | Status: DC | PRN

## 2020-03-21 MED ORDER — CLOPIDOGREL BISULFATE 75 MG PO TABS: 75.0000 mg | ORAL_TABLET | ORAL | Status: DC

## 2020-03-21 MED ORDER — SODIUM CHLORIDE 0.9 % IV SOLN: INTRAVENOUS | Status: DC

## 2020-03-21 MED ORDER — NITROGLYCERIN 1 MG/10 ML FOR IR/CATH LAB
INTRA_ARTERIAL | Status: AC
  Filled 2020-03-21: qty 10

## 2020-03-21 MED ORDER — SODIUM CHLORIDE 0.9 % WEIGHT BASED INFUSION
3.0000 mL/kg/h | INTRAVENOUS | Status: AC
  Administered 2020-03-21: 3 mL/kg/h via INTRAVENOUS

## 2020-03-21 MED ORDER — HEPARIN (PORCINE) IN NACL 1000-0.9 UT/500ML-% IV SOLN
INTRAVENOUS | Status: DC | PRN
  Administered 2020-03-21 (×2): 500 mL

## 2020-03-21 MED ORDER — MIDAZOLAM HCL 2 MG/2ML IJ SOLN
INTRAMUSCULAR | Status: AC
  Filled 2020-03-21: qty 2

## 2020-03-21 MED ORDER — MIDAZOLAM HCL 2 MG/2ML IJ SOLN
INTRAMUSCULAR | Status: DC | PRN
  Administered 2020-03-21 (×2): 1 mg via INTRAVENOUS

## 2020-03-21 MED ORDER — ANGIOPLASTY BOOK
Status: AC
  Filled 2020-03-21: qty 1

## 2020-03-21 MED ORDER — CLOPIDOGREL BISULFATE 300 MG PO TABS
ORAL_TABLET | ORAL | Status: AC
  Filled 2020-03-21: qty 1

## 2020-03-21 MED ORDER — ONDANSETRON HCL 4 MG/2ML IJ SOLN: 4.0000 mg | Freq: Four times a day (QID) | INTRAMUSCULAR | Status: DC | PRN

## 2020-03-21 MED ORDER — ASPIRIN 81 MG PO CHEW: 81.0000 mg | CHEWABLE_TABLET | ORAL | Status: DC

## 2020-03-21 MED ORDER — VERAPAMIL HCL 2.5 MG/ML IV SOLN
INTRAVENOUS | Status: AC
  Filled 2020-03-21: qty 2

## 2020-03-21 MED ORDER — HYDRALAZINE HCL 20 MG/ML IJ SOLN: 10.0000 mg | INTRAMUSCULAR | Status: DC | PRN

## 2020-03-21 MED ORDER — ATORVASTATIN CALCIUM 40 MG PO TABS
40.0000 mg | ORAL_TABLET | Freq: Every day | ORAL | 0 refills | Status: DC
Start: 2020-03-21 — End: 2020-07-31

## 2020-03-21 MED ORDER — FENTANYL CITRATE (PF) 100 MCG/2ML IJ SOLN
INTRAMUSCULAR | Status: DC | PRN
  Administered 2020-03-21: 25 ug via INTRAVENOUS
  Administered 2020-03-21: 50 ug via INTRAVENOUS

## 2020-03-21 MED ORDER — LIDOCAINE HCL (PF) 1 % IJ SOLN
INTRAMUSCULAR | Status: AC
  Filled 2020-03-21: qty 30

## 2020-03-21 MED ORDER — NITROGLYCERIN 1 MG/10 ML FOR IR/CATH LAB
INTRA_ARTERIAL | Status: DC | PRN
  Administered 2020-03-21: 200 ug via INTRACORONARY

## 2020-03-21 MED ORDER — ADENOSINE (DIAGNOSTIC) 140MCG/KG/MIN
INTRAVENOUS | Status: AC | PRN
  Administered 2020-03-21: 140 ug/kg/min via INTRAVENOUS

## 2020-03-21 MED ORDER — LABETALOL HCL 5 MG/ML IV SOLN: 10.0000 mg | INTRAVENOUS | Status: DC | PRN

## 2020-03-21 MED ORDER — HEPARIN (PORCINE) IN NACL 1000-0.9 UT/500ML-% IV SOLN
INTRAVENOUS | Status: AC
  Filled 2020-03-21: qty 1000

## 2020-03-21 MED ORDER — ACETAMINOPHEN 325 MG PO TABS: 650.0000 mg | ORAL_TABLET | ORAL | Status: DC | PRN

## 2020-03-21 MED ORDER — FENTANYL CITRATE (PF) 100 MCG/2ML IJ SOLN
INTRAMUSCULAR | Status: AC
  Filled 2020-03-21: qty 2

## 2020-03-21 SURGICAL SUPPLY — 16 items
BALLN SAPPHIRE ~~LOC~~ 3.5X15 (BALLOONS) ×2 IMPLANT
BALLN WOLVERINE 2.75X15 (BALLOONS) ×2
BALLOON WOLVERINE 2.75X15 (BALLOONS) ×1 IMPLANT
CATH 5FR JL3.5 JR4 ANG PIG MP (CATHETERS) ×2 IMPLANT
CATH LAUNCHER 6FR AL.75 (CATHETERS) ×2 IMPLANT
DEVICE RAD COMP TR BAND LRG (VASCULAR PRODUCTS) ×2 IMPLANT
GLIDESHEATH SLEND SS 6F .021 (SHEATH) ×2 IMPLANT
GUIDEWIRE INQWIRE 1.5J.035X260 (WIRE) ×2 IMPLANT
GUIDEWIRE PRESSURE COMET II (WIRE) ×2 IMPLANT
INQWIRE 1.5J .035X260CM (WIRE) ×4
KIT ENCORE 26 ADVANTAGE (KITS) ×2 IMPLANT
KIT HEART LEFT (KITS) ×2 IMPLANT
PACK CARDIAC CATHETERIZATION (CUSTOM PROCEDURE TRAY) ×2 IMPLANT
SYR MEDRAD MARK 7 150ML (SYRINGE) ×2 IMPLANT
TRANSDUCER W/STOPCOCK (MISCELLANEOUS) ×2 IMPLANT
TUBING CIL FLEX 10 FLL-RA (TUBING) ×2 IMPLANT

## 2020-03-21 NOTE — Interval H&P Note (Signed)
History and Physical Interval Note:  03/21/2020 9:37 AM  Erik Nicholson  has presented today for surgery, with the diagnosis of accelerating angina.  The various methods of treatment have been discussed with the patient and family. After consideration of risks, benefits and other options for treatment, the patient has consented to  Procedure(s): LEFT HEART CATH AND CORONARY ANGIOGRAPHY (N/A) as a surgical intervention.  The patient's history has been reviewed, patient examined, no change in status, stable for surgery.  I have reviewed the patient's chart and labs.  Questions were answered to the patient's satisfaction.    Cath Lab Visit (complete for each Cath Lab visit)  Clinical Evaluation Leading to the Procedure:   ACS: No.  Non-ACS:    Anginal Classification: CCS III  Anti-ischemic medical therapy: Maximal Therapy (2 or more classes of medications)  Non-Invasive Test Results: Low-risk stress test findings: cardiac mortality <1%/year  Prior CABG: No previous CABG  Erik Nicholson

## 2020-03-21 NOTE — Progress Notes (Signed)
CARDIAC REHAB PHASE I   PTCA education completed with pt. Pt educated on importance of ASA and Plavix. Reviewed signs and symptoms of bleeding along with site care and restrictions. Encouraged ambulation as able with emphasis on safety. CRP II order placed, pt not interested in attending.  [id number] Rufina Falco, RN BSN 03/21/2020 1:27 PM

## 2020-03-21 NOTE — Discharge Summary (Signed)
Discharge Summary for Same Day PCI   Patient ID: Erik Nicholson MRN: [id number]; DOB: 07-Feb-1951  Admit date: 03/21/2020 Discharge date: 03/21/2020  Primary Care Provider: [name], [name], MD  Primary Cardiologist: Nelva Bush, MD  Primary Electrophysiologist:  None   Discharge Diagnoses    Principal Problem:   Accelerating angina [facility])   Diagnostic Studies/Procedures    Cardiac Catheterization 03/21/2020:  Conclusions: 1. Severe single-vessel coronary artery disease with up to 75% in-stent restenosis of the mid/distal RCA (FFR 0.70), as well as 60-70% ostial stenoses of rPDA and rPL1. 2. Mild to moderate, non-critical left coronary artery disease.  The most severe lesion is a small to moderate caliber D1 branch that is jailed by LAD stent with up to 70% stenosis. 3. Normal left ventricular systolic function with mildly elevated filling pressure. 4. Successful PTCA of mid/distal RCA with reduction in stenosis from 75% to 20% with TIMI-3 flow.  Stent placement not attempted due to multiple layers of stent already present and incomplete expansion of 3.5 mm non-compliant balloon at 22 atm.  Recommendations: 1. Continue aggressive secondary prevention. 2. Indefinite dual antiplatelet therapy with aspirin and clopidogrel; discontinuation of aspirin could be considered after 6 months of therapy. 3. Anticipate same-day discharge if no post-catheterization complication occur.  Nelva Bush, MD [facility]  Diagnostic Dominance: Right  Intervention    _____________   History of Present Illness     [name] is a 69 y.o. male with history of coronary artery disease status post multiple PCI's, peripheral vascular disease status post bilateral lower extremity stenting followed by Dr. Scot Dock, ulcerative colitis, hypertension, hyperlipidemia, hepatitis C, prostate cancer, and BPH, who presents for follow-up of CAD.  He was last seen in May by Christell Faith, PA.  At  that time he reported stable exertional dyspnea.  He denied angina and claudication.  No medication changes or additional testing were recommended.  Today, [name] reports that he has been feeling more frequent shortness of breath and chest heaviness with activity.  If he walks 100 feet at anything greater than a slow pace, he feels as though an elephant is sitting on his chest.  Sometimes he has to stop to bend over, with the dyspnea and pain resolving quite quickly.  He has not used sublingual nitroglycerin at times with relief.  However, he feels like prednisone also helps his symptoms.  He denies chest pain at rest or when walking slowly.  However, the symptoms have worsened over the last 6 months and are reminiscent to what he felt prior to stent placements in the past (he reports a history of 7 coronary and 3 lower extremity stents placed while living near [place]  Mr. Orenthal Debski has not had any palpitations, lightheadedness, or edema.  He is only taking clopidogrel every other day, as he is concerned that daily dosing may cause him to have bleeding.  He otherwise remains compliant with his medications.  His mobility is also somewhat limited due to weakness and pain in his right leg that he attributes to chronic low back problems.  He has completed treatment for prostate cancer.  Cardiac catheterization was arranged for further evaluation.  Hospital Course     The patient underwent cardiac cath as noted above with severe single vessel CAD with 75% ISR of the m/dRCA RCA as well as 60-70% ostial stenosis of the rPDA. Mild to moderate, non-critical LAD, along with severe lesion in small caliber D1 branch which is jailed by  LAD stent. Successful PTCA of the m/dRCA with reduction of lesion from 75% to 20%.  Plan for DAPT with ASA/plavix for at least 6 months. The patient was seen by cardiac rehab while in short stay. There were no observed complications post cath. Radial cath site was  re-evaluated prior to discharge and found to be stable without any complications. Instructions/precautions regarding cath site care were given prior to discharge.  Bland Rudzinski was seen by Dr. Saunders Revel and determined stable for discharge home. Follow up with our office has been arranged. Medications are listed below. Pertinent changes include increasing Atorvastatin to 19m daily.  _____________  Cath/PCI Registry Performance & Quality Measures: 1. Aspirin prescribed? - Yes 2. ADP Receptor Inhibitor (Plavix/Clopidogrel, Brilinta/Ticagrelor or Effient/Prasugrel) prescribed (includes medically managed patients)? - Yes 3. High Intensity Statin (Lipitor 40-875mor Crestor 20-408mprescribed? - Yes 4. For EF <40%, was ACEI/ARB prescribed? - Not Applicable (EF >/= 40%98%. For EF <40%, Aldosterone Antagonist (Spironolactone or Eplerenone) prescribed? - Not Applicable (EF >/= 40%11%. Cardiac Rehab Phase II ordered (Included Medically managed Patients)? - Yes  _____________   Discharge Vitals Blood pressure (!) 149/73, pulse 63, temperature 97.9 F (36.6 C), temperature source Oral, resp. rate 16, height 5' 7"  (1.702 m), weight 74.8 kg, SpO2 100 %.  Filed Weights   03/21/20 0836  Weight: 74.8 kg    Last Labs & Radiologic Studies    CBC No results for input(s): WBC, NEUTROABS, HGB, HCT, MCV, PLT in the last 72 hours. Basic Metabolic Panel No results for input(s): NA, K, CL, CO2, GLUCOSE, BUN, CREATININE, CALCIUM, MG, PHOS in the last 72 hours. Liver Function Tests No results for input(s): AST, ALT, ALKPHOS, BILITOT, PROT, ALBUMIN in the last 72 hours. No results for input(s): LIPASE, AMYLASE in the last 72 hours. High Sensitivity Troponin:   No results for input(s): TROPONINIHS in the last 720 hours.  BNP Invalid input(s): POCBNP D-Dimer No results for input(s): DDIMER in the last 72 hours. Hemoglobin A1C No results for input(s): HGBA1C in the last 72 hours. Fasting Lipid Panel No  results for input(s): CHOL, HDL, LDLCALC, TRIG, CHOLHDL, LDLDIRECT in the last 72 hours. Thyroid Function Tests No results for input(s): TSH, T4TOTAL, T3FREE, THYROIDAB in the last 72 hours.  Invalid input(s): FREET3 _____________  CARDIAC CATHETERIZATION  Result Date: 03/21/2020 Conclusions: 1. Severe single-vessel coronary artery disease with up to 75% in-stent restenosis of the mid/distal RCA (FFR 0.70), as well as 60-70% ostial stenoses of rPDA and rPL1. 2. Mild to moderate, non-critical left coronary artery disease.  The most severe lesion is a small to moderate caliber D1 branch that is jailed by LAD stent with up to 70% stenosis. 3. Normal left ventricular systolic function with mildly elevated filling pressure. 4. Successful PTCA of mid/distal RCA with reduction in stenosis from 75% to 20% with TIMI-3 flow.  Stent placement not attempted due to multiple layers of stent already present and incomplete expansion of 3.5 mm non-compliant balloon at 22 atm. Recommendations: 1. Continue aggressive secondary prevention. 2. Indefinite dual antiplatelet therapy with aspirin and clopidogrel; discontinuation of aspirin could be considered after 6 months of therapy. 3. Anticipate same-day discharge if no post-catheterization complication occur. Chr[facility]elva BushD [facility]    Disposition   Pt is being discharged home today in good condition.  Follow-up Plans & Appointments     Follow-up Information    [facility] Follow up on 04/05/2020.   Specialty: Cardiology Why: at 10:30am for  your follow up appt Contact information: [place] [address] [phone]              Discharge Instructions    AMB Referral to Cardiac Rehabilitation - Phase II   Complete by: As directed    Diagnosis: PTCA   After initial evaluation and assessments completed: Virtual Based Care may be provided alone or in conjunction with Phase 2 Cardiac Rehab based on  patient barriers.: Yes     Discharge Medications   Allergies as of 03/21/2020      Reactions   Cymbalta [duloxetine Hcl] Other (See Comments)   drowsiness      Medication List    STOP taking these medications   omeprazole 40 MG capsule Commonly known as: PRILOSEC     TAKE these medications   aspirin 81 MG chewable tablet Chew 1 tablet (81 mg total) by mouth daily. Start taking on: March 22, 2020   atorvastatin 40 MG tablet Commonly known as: LIPITOR Take 1 tablet (40 mg total) by mouth daily. What changed:   medication strength  how much to take  when to take this   clopidogrel 75 MG tablet Commonly known as: PLAVIX Take 1 tablet (75 mg total) by mouth daily.   famotidine 40 MG tablet Commonly known as: PEPCID TAKE 1 TABLET BY MOUTH TWICE A DAY   hyoscyamine 0.375 MG 12 hr tablet Commonly known as: LEVBID TAKE 1 TABLET (0.375 MG TOTAL) BY MOUTH 2 (TWO) TIMES DAILY.   isosorbide mononitrate 30 MG 24 hr tablet Commonly known as: IMDUR TAKE 1 TABLET BY MOUTH EVERY DAY   lidocaine 5 % Commonly known as: LIDODERM Place 1 patch onto the skin daily as needed (pain). Remove patch after 12 hours.   mesalamine 1.2 g EC tablet Commonly known as: LIALDA Take 2 tablets (2.4 g total) by mouth daily with breakfast.   metoprolol succinate 50 MG 24 hr tablet Commonly known as: TOPROL-XL Take 1 tablet (50 mg total) by mouth daily.   metoprolol succinate 100 MG 24 hr tablet Commonly known as: TOPROL-XL Take 1 tablet (100 mg total) by mouth daily. Take with or immediately following a meal.   nitroGLYCERIN 0.4 MG SL tablet Commonly known as: NITROSTAT Place 1 tablet (0.4 mg total) under the tongue every 5 (five) minutes as needed for chest pain. If taking 2 call 911   pantoprazole 40 MG tablet Commonly known as: Protonix Take 1 tablet (40 mg total) by mouth daily.   Restasis 0.05 % ophthalmic emulsion Generic drug: cycloSPORINE Place 1 drop into both eyes 2  (two) times daily.   tamsulosin 0.4 MG Caps capsule Commonly known as: FLOMAX Take 0.4 mg by mouth daily.   telmisartan-hydrochlorothiazide 40-12.5 MG tablet Commonly known as: Micardis HCT Take 1 tablet by mouth daily.       Allergies Allergies  Allergen Reactions  . Cymbalta [Duloxetine Hcl] Other (See Comments)    drowsiness    Outstanding Labs/Studies   FLP/LFTs in 8 weeks  Duration of Discharge Encounter   Greater than 30 minutes including physician time.  Signed, Reino Bellis, NP 03/21/2020, 2:27 PM

## 2020-03-21 NOTE — Progress Notes (Signed)
Discharge instructions reviewed with patient and family. Verbalized understanding. 

## 2020-03-21 NOTE — Discharge Instructions (Signed)
Radial Site Care  This sheet gives you information about how to care for yourself after your procedure. Your health care provider may also give you more specific instructions. If you have problems or questions, contact your health care provider. What can I expect after the procedure? After the procedure, it is common to have:  Bruising and tenderness at the catheter insertion area. Follow these instructions at home: Medicines  Take over-the-counter and prescription medicines only as told by your health care provider. Insertion site care  Follow instructions from your health care provider about how to take care of your insertion site. Make sure you: ? Wash your hands with soap and water before you change your bandage (dressing). If soap and water are not available, use hand sanitizer. ? Change your dressing as told by your health care provider. ? Leave stitches (sutures), skin glue, or adhesive strips in place. These skin closures may need to stay in place for 2 weeks or longer. If adhesive strip edges start to loosen and curl up, you may trim the loose edges. Do not remove adhesive strips completely unless your health care provider tells you to do that.  Check your insertion site every day for signs of infection. Check for: ? Redness, swelling, or pain. ? Fluid or blood. ? Pus or a bad smell. ? Warmth.  Do not take baths, swim, or use a hot tub until your health care provider approves.  You may shower 24-48 hours after the procedure, or as directed by your health care provider. ? Remove the dressing and gently wash the site with plain soap and water. ? Pat the area dry with a clean towel. ? Do not rub the site. That could cause bleeding.  Do not apply powder or lotion to the site. Activity   For 24 hours after the procedure, or as directed by your health care provider: ? Do not flex or bend the affected arm. ? Do not push or pull heavy objects with the affected arm. ? Do not  drive yourself home from the hospital or clinic. You may drive 24 hours after the procedure unless your health care provider tells you not to. ? Do not operate machinery or power tools.  Do not lift anything that is heavier than 10 lb (4.5 kg), or the limit that you are told, until your health care provider says that it is safe.  Ask your health care provider when it is okay to: ? Return to work or school. ? Resume usual physical activities or sports. ? Resume sexual activity. General instructions  If the catheter site starts to bleed, raise your arm and put firm pressure on the site. If the bleeding does not stop, get help right away. This is a medical emergency.  If you went home on the same day as your procedure, a responsible adult should be with you for the first 24 hours after you arrive home.  Keep all follow-up visits as told by your health care provider. This is important. Contact a health care provider if:  You have a fever.  You have redness, swelling, or yellow drainage around your insertion site. Get help right away if:  You have unusual pain at the radial site.  The catheter insertion area swells very fast.  The insertion area is bleeding, and the bleeding does not stop when you hold steady pressure on the area.  Your arm or hand becomes pale, cool, tingly, or numb. These symptoms may represent a serious problem   that is an emergency. Do not wait to see if the symptoms will go away. Get medical help right away. Call your local emergency services (911 in the [place] Do not drive yourself to the hospital. Summary  After the procedure, it is common to have bruising and tenderness at the site.  Follow instructions from your health care provider about how to take care of your radial site wound. Check the wound every day for signs of infection.  Do not lift anything that is heavier than 10 lb (4.5 kg), or the limit that you are told, until your health care provider says  that it is safe. This information is not intended to replace advice given to you by your health care provider. Make sure you discuss any questions you have with your health care provider. Document Revised: 05/12/2017 Document Reviewed: 05/12/2017 Elsevier Patient Education  2020 [employer]  

## 2020-03-22 ENCOUNTER — Encounter (HOSPITAL_COMMUNITY): Payer: Self-pay | Admitting: Internal Medicine

## 2020-03-30 ENCOUNTER — Other Ambulatory Visit: Payer: Self-pay | Admitting: Gastroenterology

## 2020-04-01 ENCOUNTER — Other Ambulatory Visit: Payer: Self-pay | Admitting: Cardiovascular Disease

## 2020-04-01 ENCOUNTER — Other Ambulatory Visit: Payer: Self-pay | Admitting: Cardiology

## 2020-04-01 ENCOUNTER — Other Ambulatory Visit: Payer: Self-pay | Admitting: Internal Medicine

## 2020-04-02 ENCOUNTER — Encounter: Payer: Self-pay | Admitting: Internal Medicine

## 2020-04-02 ENCOUNTER — Other Ambulatory Visit: Payer: Self-pay

## 2020-04-02 ENCOUNTER — Ambulatory Visit (INDEPENDENT_AMBULATORY_CARE_PROVIDER_SITE_OTHER): Payer: Medicare Other

## 2020-04-02 ENCOUNTER — Ambulatory Visit (INDEPENDENT_AMBULATORY_CARE_PROVIDER_SITE_OTHER): Payer: Medicare Other | Admitting: Internal Medicine

## 2020-04-02 VITALS — BP 130/72 | HR 63 | Temp 97.9°F | Ht 67.0 in | Wt 169.1 lb

## 2020-04-02 DIAGNOSIS — G8929 Other chronic pain: Secondary | ICD-10-CM

## 2020-04-02 DIAGNOSIS — M25512 Pain in left shoulder: Secondary | ICD-10-CM

## 2020-04-02 MED ORDER — OXYCODONE-ACETAMINOPHEN 5-325 MG PO TABS: 1.0000 | ORAL_TABLET | Freq: Two times a day (BID) | ORAL | 0 refills | Status: DC | PRN

## 2020-04-02 MED ORDER — METHYLPREDNISOLONE ACETATE 40 MG/ML IJ SUSP
40.0000 mg | Freq: Once | INTRAMUSCULAR | Status: AC
  Administered 2020-04-02: 40 mg via INTRAMUSCULAR

## 2020-04-02 NOTE — Telephone Encounter (Signed)
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 Saunders Revel

## 2020-04-02 NOTE — Progress Notes (Signed)
Chief Complaint  Patient presents with  . Shoulder Pain    Ongoing for the last 10 days, NKI or changes. Feels like the rotator cuff tear he had years ago, this was in both shoulders. 10/10 pain.   Left shoulder pain 9.5/10 with reduced ROM x 7-10 days b/l surgery rotator cuff in the 1990s pain worse with overhead motion and lateral rotation and internal rotation of forearms tylenol w/o help pain affects driving    Review of Systems  Constitutional: Negative for weight loss.  HENT: Negative for hearing loss.   Eyes: Negative for blurred vision.  Cardiovascular: Negative for chest pain.  Musculoskeletal: Positive for joint pain.  Skin: Negative for rash.   Past Medical History:  Diagnosis Date  . Anemia   . Arthritis   . BPH (benign prostatic hyperplasia)   . CAD (coronary artery disease)    x 7 cardiac stents  . Chronic back pain    L4/5 with chronic right leg pain   . Floater, vitreous, left   . Hx of hepatitis C    treated with Harvoni in 2014  . Hyperlipidemia   . Hypertension   . Neuromuscular disorder ([place])    nerve damage in back  . Neuropathy   . Prostate cancer ([place])   . PVD (peripheral vascular disease) ([place])    2 stents right leg and 1 in left   . Ulcerative colitis ([place])    with diarrhea   Past Surgical History:  Procedure Laterality Date  . back surgery     x 2 lumbar last 09/2016 in [place]  . CARDIAC CATHETERIZATION    . CATARACT EXTRACTION, BILATERAL    . COLONOSCOPY     last 09/2018 in [place]  . CORONARY BALLOON ANGIOPLASTY N/A 03/21/2020   Procedure: CORONARY BALLOON ANGIOPLASTY;  Surgeon: Nelva Bush, MD;  Location: [facility];  Service: Cardiovascular;  Laterality: N/A;  . HERNIA REPAIR     right   . INTRAVASCULAR PRESSURE WIRE/FFR STUDY N/A 03/21/2020   Procedure: INTRAVASCULAR PRESSURE WIRE/FFR STUDY;  Surgeon: Nelva Bush, MD;  Location: [facility];  Service: Cardiovascular;  Laterality: N/A;  . LEFT  HEART CATH AND CORONARY ANGIOGRAPHY N/A 03/21/2020   Procedure: LEFT HEART CATH AND CORONARY ANGIOGRAPHY;  Surgeon: Nelva Bush, MD;  Location: [facility];  Service: Cardiovascular;  Laterality: N/A;  . PENILE PROSTHESIS IMPLANT    . PERCUTANEOUS CORONARY STENT INTERVENTION (PCI-S)     x 7-8 heart  . PERIPHERAL ARTERIAL STENT GRAFT    . PROSTATE BIOPSY    . pvd     with stenting x 2 right leg below knee and 1 left thigh   . SHOULDER ARTHROSCOPY Bilateral    Family History  Problem Relation Age of Onset  . Heart disease Mother   . Stroke Mother   . Breast cancer Mother   . Diabetes Sister   . Diabetes Brother   . Colon cancer Neg Hx   . Esophageal cancer Neg Hx   . Inflammatory bowel disease Neg Hx   . Liver disease Neg Hx   . Pancreatic cancer Neg Hx   . Rectal cancer Neg Hx   . Stomach cancer Neg Hx    Social History   Socioeconomic History  . Marital status: Married    Spouse name: Not on file  . Number of children: 2  . Years of education: Not on file  . Highest education level: Not on file  Occupational History  . Not on file  Tobacco Use  . Smoking status: Former Smoker    Packs/day: 1.00    Years: 40.00    Pack years: 40.00    Types: Cigarettes    Quit date: 04/20/1998    Years since quitting: 21.9  . Smokeless tobacco: Never Used  . Tobacco comment: quit in 2000s   Vaping Use  . Vaping Use: Never used  Substance and Sexual Activity  . Alcohol use: Yes    Comment: occasional  . Drug use: Not Currently  . Sexual activity: Yes  Other Topics Concern  . Not on file  Social History Narrative   Moved from [place] in 10/2018   2 sons in 41s as of 11/24/2018    Married wife is DPR Pamala Hurry x 45 years as of 04/2019   Social Determinants of Health   Financial Resource Strain: Not on file  Food Insecurity: Not on file  Transportation Needs: Not on file  Physical Activity: Not on file  Stress: Not on file  Social Connections: Not on file   Intimate Partner Violence: Not on file   Current Meds  Medication Sig  . aspirin 81 MG chewable tablet Chew 1 tablet (81 mg total) by mouth daily.  Marland Kitchen atorvastatin (LIPITOR) 40 MG tablet Take 1 tablet (40 mg total) by mouth daily.  . clopidogrel (PLAVIX) 75 MG tablet Take 1 tablet (75 mg total) by mouth daily.  . cyclobenzaprine (FLEXERIL) 5 MG tablet Take 5 mg by mouth 2 (two) times daily.  . famotidine (PEPCID) 40 MG tablet TAKE 1 TABLET BY MOUTH TWICE A DAY (Patient taking differently: Take 40 mg by mouth 2 (two) times daily.)  . hyoscyamine (LEVBID) 0.375 MG 12 hr tablet TAKE 1 TABLET (0.375 MG TOTAL) BY MOUTH 2 (TWO) TIMES DAILY.  . isosorbide mononitrate (IMDUR) 30 MG 24 hr tablet TAKE 1 TABLET BY MOUTH EVERY DAY  . lidocaine (LIDODERM) 5 % Place 1 patch onto the skin daily as needed (pain). Remove patch after 12 hours.  . mesalamine (LIALDA) 1.2 g EC tablet TAKE 2 TABLETS BY MOUTH DAILY WITH BREAKFAST.  . metoprolol succinate (TOPROL-XL) 100 MG 24 hr tablet Take 1 tablet (100 mg total) by mouth daily. Take with or immediately following a meal.  . pantoprazole (PROTONIX) 40 MG tablet Take 1 tablet (40 mg total) by mouth daily.  . RESTASIS 0.05 % ophthalmic emulsion Place 1 drop into both eyes 2 (two) times daily.   . tamsulosin (FLOMAX) 0.4 MG CAPS capsule Take 0.4 mg by mouth daily.  Marland Kitchen telmisartan-hydrochlorothiazide (MICARDIS HCT) 40-12.5 MG tablet Take 1 tablet by mouth daily.   Allergies  Allergen Reactions  . Cymbalta [Duloxetine Hcl] Other (See Comments)    drowsiness   Recent Results (from the past 2160 hour(s))  Iron, TIBC and Ferritin Panel     Status: None   Collection Time: 01/30/20  8:03 AM  Result Value Ref Range   Iron 78 50 - 180 mcg/dL   TIBC 354 250 - 425 mcg/dL (calc)   %SAT 22 20 - 48 % (calc)   Ferritin 179 24 - 380 ng/mL  Urinalysis, Routine w reflex microscopic     Status: None   Collection Time: 01/30/20  8:03 AM  Result Value Ref Range   Color, Urine  YELLOW YELLOW   APPearance CLEAR CLEAR   Specific Gravity, Urine 1.021 1.001 - 1.03   pH 6.0 5.0 - 8.0   Glucose, UA NEGATIVE NEGATIVE  Bilirubin Urine NEGATIVE NEGATIVE   Ketones, ur NEGATIVE NEGATIVE   Hgb urine dipstick NEGATIVE NEGATIVE   Protein, ur NEGATIVE NEGATIVE   Nitrite NEGATIVE NEGATIVE   Leukocytes,Ua NEGATIVE NEGATIVE  TSH     Status: None   Collection Time: 01/30/20  8:03 AM  Result Value Ref Range   TSH 0.72 0.35 - 4.50 uIU/mL  Lipid panel     Status: None   Collection Time: 01/30/20  8:03 AM  Result Value Ref Range   Cholesterol 126 0 - 200 mg/dL    Comment: ATP III Classification       Desirable:  < 200 mg/dL               Borderline High:  200 - 239 mg/dL          High:  > = 240 mg/dL   Triglycerides 113.0 0.0 - 149.0 mg/dL    Comment: Normal:  <150 mg/dLBorderline High:  150 - 199 mg/dL   HDL 58.80 >39.00 mg/dL   VLDL 22.6 0.0 - 40.0 mg/dL   LDL Cholesterol 45 0 - 99 mg/dL   Total CHOL/HDL Ratio 2     Comment:                Men          Women1/2 Average Risk     3.4          3.3Average Risk          5.0          4.42X Average Risk          9.6          7.13X Average Risk          15.0          11.0                       NonHDL 67.13     Comment: NOTE:  Non-HDL goal should be 30 mg/dL higher than patient's LDL goal (i.e. LDL goal of < 70 mg/dL, would have non-HDL goal of < 100 mg/dL)  CBC with Differential/Platelet     Status: Abnormal   Collection Time: 01/30/20  8:03 AM  Result Value Ref Range   WBC 5.1 4.0 - 10.5 K/uL   RBC 3.60 (L) 4.22 - 5.81 Mil/uL   Hemoglobin 12.1 (L) 13.0 - 17.0 g/dL   HCT 36.1 (L) 39.0 - 52.0 %   MCV 100.2 (H) 78.0 - 100.0 fl   MCHC 33.6 30.0 - 36.0 g/dL   RDW 12.4 11.5 - 15.5 %   Platelets 179.0 150.0 - 400.0 K/uL   Neutrophils Relative % 65.8 43.0 - 77.0 %   Lymphocytes Relative 20.2 12.0 - 46.0 %   Monocytes Relative 10.8 3.0 - 12.0 %   Eosinophils Relative 2.6 0.0 - 5.0 %   Basophils Relative 0.6 0.0 - 3.0 %   Neutro  Abs 3.3 1.4 - 7.7 K/uL   Lymphs Abs 1.0 0.7 - 4.0 K/uL   Monocytes Absolute 0.5 0.1 - 1.0 K/uL   Eosinophils Absolute 0.1 0.0 - 0.7 K/uL   Basophils Absolute 0.0 0.0 - 0.1 K/uL  Comprehensive metabolic panel     Status: Abnormal   Collection Time: 01/30/20  8:03 AM  Result Value Ref Range   Sodium 139 135 - 145 mEq/L   Potassium 3.5 3.5 - 5.1 mEq/L   Chloride 103 96 - 112 mEq/L   CO2  27 19 - 32 mEq/L   Glucose, Bld 115 (H) 70 - 99 mg/dL   BUN 17 6 - 23 mg/dL   Creatinine, Ser 0.82 0.40 - 1.50 mg/dL   Total Bilirubin 1.0 0.2 - 1.2 mg/dL   Alkaline Phosphatase 64 39 - 117 U/L   AST 31 0 - 37 U/L   ALT 29 0 - 53 U/L   Total Protein 7.7 6.0 - 8.3 g/dL   Albumin 4.1 3.5 - 5.2 g/dL   GFR 88.74 >60.00 mL/min   Calcium 9.6 8.4 - 10.5 mg/dL  POCT HgB A1C     Status: Abnormal   Collection Time: 02/06/20  9:55 AM  Result Value Ref Range   Hemoglobin A1C 4.9 4.0 - 5.6 %   HbA1c POC (<> result, manual entry) 4.9 4.0 - 5.6 %   HbA1c, POC (prediabetic range) 4.9 (A) 5.7 - 6.4 %   HbA1c, POC (controlled diabetic range) 4.9 0.0 - 7.0 %  Basic metabolic panel     Status: Abnormal   Collection Time: 03/13/20 12:51 PM  Result Value Ref Range   Glucose 103 (H) 65 - 99 mg/dL   BUN 20 8 - 27 mg/dL   Creatinine, Ser 0.93 0.76 - 1.27 mg/dL   GFR calc non Af Amer 82 >59 mL/min/1.73   GFR calc Af Amer 95 >59 mL/min/1.73    Comment: **In accordance with recommendations from the NKF-ASN Task force,**   [facility] is in the process of updating its eGFR calculation to the   2021 CKD-EPI creatinine equation that estimates kidney function   without a race variable.    BUN/Creatinine Ratio 22 10 - 24   Sodium 140 134 - 144 mmol/L   Potassium 3.8 3.5 - 5.2 mmol/L   Chloride 102 96 - 106 mmol/L   CO2 24 20 - 29 mmol/L   Calcium 9.9 8.6 - 10.2 mg/dL  CBC     Status: Abnormal   Collection Time: 03/13/20 12:51 PM  Result Value Ref Range   WBC 5.8 3.4 - 10.8 x10E3/uL   RBC 3.94 (L) 4.14 - 5.80 x10E6/uL    Hemoglobin 12.9 (L) 13.0 - 17.7 g/dL   Hematocrit 38.1 37.5 - 51.0 %   MCV 97 79 - 97 fL   MCH 32.7 26.6 - 33.0 pg   MCHC 33.9 31.5 - 35.7 g/dL   RDW 11.6 11.6 - 15.4 %   Platelets 199 150 - 450 x10E3/uL  SARS CORONAVIRUS 2 (TAT 6-24 HRS) Nasopharyngeal Nasopharyngeal Swab     Status: None   Collection Time: 03/19/20 10:48 AM   Specimen: Nasopharyngeal Swab  Result Value Ref Range   SARS Coronavirus 2 NEGATIVE NEGATIVE    Comment: (NOTE) SARS-CoV-2 target nucleic acids are NOT DETECTED.  The SARS-CoV-2 RNA is generally detectable in upper and lower respiratory specimens during the acute phase of infection. Negative results do not preclude SARS-CoV-2 infection, do not rule out co-infections with other pathogens, and should not be used as the sole basis for treatment or other patient management decisions. Negative results must be combined with clinical observations, patient history, and epidemiological information. The expected result is Negative.  Fact Sheet for Patients: SugarRoll.be  Fact Sheet for Healthcare Providers: https://www.woods-mathews.com/  This test is not yet approved or cleared by the [place] FDA and  has been authorized for detection and/or diagnosis of SARS-CoV-2 by FDA under an Emergency Use Authorization (EUA). This EUA will remain  in effect (meaning this test can be used) for  the duration of the COVID-19 declaration under Se ction 564(b)(1) of the Act, 21 U.S.C. section 360bbb-3(b)(1), unless the authorization is terminated or revoked sooner.  Performed at [facility], [place] [address] [place], [place] [address]   POCT Activated clotting time     Status: None   Collection Time: 03/21/20 10:22 AM  Result Value Ref Range   Activated Clotting Time 267 seconds  POCT Activated clotting time     Status: None   Collection Time: 03/21/20 10:40 AM  Result Value Ref Range   Activated Clotting Time 285  seconds   Objective  Body mass index is 26.49 kg/m. Wt Readings from Last 3 Encounters:  04/02/20 169 lb 1.9 oz (76.7 kg)  03/21/20 165 lb (74.8 kg)  03/13/20 168 lb 4 oz (76.3 kg)   Temp Readings from Last 3 Encounters:  04/02/20 97.9 F (36.6 C) (Oral)  03/21/20 97.9 F (36.6 C) (Oral)  02/06/20 97.9 F (36.6 C) (Oral)   BP Readings from Last 3 Encounters:  04/02/20 130/72  03/21/20 (!) 150/76  03/13/20 140/84   Pulse Readings from Last 3 Encounters:  04/02/20 63  03/21/20 65  03/13/20 84    Physical Exam Vitals and nursing note reviewed.  Constitutional:      Appearance: Normal appearance. He is well-developed and well-groomed.  HENT:     Head: Normocephalic and atraumatic.  Eyes:     Conjunctiva/sclera: Conjunctivae normal.     Pupils: Pupils are equal, round, and reactive to light.  Cardiovascular:     Rate and Rhythm: Normal rate and regular rhythm.     Heart sounds: Normal heart sounds. No murmur heard.   Pulmonary:     Effort: Pulmonary effort is normal.     Breath sounds: Normal breath sounds.  Musculoskeletal:     Left shoulder: Tenderness present. Decreased range of motion.  Skin:    General: Skin is warm and dry.  Neurological:     General: No focal deficit present.     Mental Status: He is alert and oriented to person, place, and time. Mental status is at baseline.     Gait: Gait normal.  Psychiatric:        Attention and Perception: Attention and perception normal.        Mood and Affect: Mood and affect normal.        Speech: Speech normal.        Behavior: Behavior normal. Behavior is cooperative.        Thought Content: Thought content normal.        Cognition and Memory: Cognition and memory normal.        Judgment: Judgment normal.     Assessment  Plan  Acute on chronic pain of left shoulder - Plan: DG Shoulder Left, Ambulatory referral [facility] Dr. Raliegh Ip , oxyCODONE-acetaminophen (PERCOCET) 5-325 MG  tablet depomedrol 40 x 1 today left upper arm  Flexeril prn  Prn voltaren gel  May need pt with ortho    Provider: Dr. Olivia Mackie [name]-Internal Medicine

## 2020-04-02 NOTE — Patient Instructions (Addendum)
Dr. Mack Gu[facility]  Xray done today   voltaren gel 4x per day   Heat    Shoulder Impingement Syndrome Rehab Ask your health care provider which exercises are safe for you. Do exercises exactly as told by your health care provider and adjust them as directed. It is normal to feel mild stretching, pulling, tightness, or discomfort as you do these exercises. Stop right away if you feel sudden pain or your pain gets worse. Do not begin these exercises until told by your health care provider. Stretching and range-of-motion exercise This exercise warms up your muscles and joints and improves the movement and flexibility of your shoulder. This exercise also helps to relieve pain and stiffness. Passive horizontal adduction In passive adduction, you use your other hand to move the injured arm toward your body. The injured arm does not move on its own. In this movement, your arm is moved across your body in the horizontal plane (horizontal adduction). 1. Sit or stand and pull your left / right elbow across your chest, toward your other shoulder. Stop when you feel a gentle stretch in the back of your shoulder and upper arm. ? Keep your arm at shoulder height. ? Keep your arm as close to your body as you comfortably can. 2. Hold for __________ seconds. 3. Slowly return to the starting position. Repeat __________ times. Complete this exercise __________ times a day. Strengthening exercises These exercises build strength and endurance in your shoulder. Endurance is the ability to use your muscles for a long time, even after they get tired. External rotation, isometric This is an exercise in which you press the back of your wrist against a door frame without moving your shoulder joint (isometric). 1. Stand or sit in a doorway, facing the door frame. 2. Bend your left / right elbow and place the back of your wrist against the door frame. Only the back of your wrist should be touching the frame.  Keep your upper arm at your side. 3. Gently press your wrist against the door frame, as if you are trying to push your arm away from your abdomen (external rotation). Press as hard as you are able without pain. ? Avoid shrugging your shoulder while you press your wrist against the door frame. Keep your shoulder blade tucked down toward the middle of your back. 4. Hold for __________ seconds. 5. Slowly release the tension, and relax your muscles completely before you repeat the exercise. Repeat __________ times. Complete this exercise __________ times a day. Internal rotation, isometric This is an exercise in which you press your palm against a door frame without moving your shoulder joint (isometric). 1. Stand or sit in a doorway, facing the door frame. 2. Bend your left / right elbow and place the palm of your hand against the door frame. Only your palm should be touching the frame. Keep your upper arm at your side. 3. Gently press your hand against the door frame, as if you are trying to push your arm toward your abdomen (internal rotation). Press as hard as you are able without pain. ? Avoid shrugging your shoulder while you press your hand against the door frame. Keep your shoulder blade tucked down toward the middle of your back. 4. Hold for __________ seconds. 5. Slowly release the tension, and relax your muscles completely before you repeat the exercise. Repeat __________ times. Complete this exercise __________ times a day. Scapular protraction, supine  1. Lie on your back on a firm  surface (supine position). Hold a __________ weight in your left / right hand. 2. Raise your left / right arm straight into the air so your hand is directly above your shoulder joint. 3. Push the weight into the air so your shoulder (scapula) lifts off the surface that you are lying on. The scapula will push up or forward (protraction). Do not move your head, neck, or back. 4. Hold for __________  seconds. 5. Slowly return to the starting position. Let your muscles relax completely before you repeat this exercise. Repeat __________ times. Complete this exercise __________ times a day. Scapular retraction  1. Sit in a stable chair without armrests, or stand up. 2. Secure an exercise band to a stable object in front of you so the band is at shoulder height. 3. Hold one end of the exercise band in each hand. Your palms should face down. 4. Squeeze your shoulder blades together (retraction) and move your elbows slightly behind you. Do not shrug your shoulders upward while you do this. 5. Hold for __________ seconds. 6. Slowly return to the starting position. Repeat __________ times. Complete this exercise __________ times a day. Shoulder extension  1. Sit in a stable chair without armrests, or stand up. 2. Secure an exercise band to a stable object in front of you so the band is above shoulder height. 3. Hold one end of the exercise band in each hand. 4. Straighten your elbows and lift your hands up to shoulder height. 5. Squeeze your shoulder blades together and pull your hands down to the sides of your thighs (extension). Stop when your hands are straight down by your sides. Do not let your hands go behind your body. 6. Hold for __________ seconds. 7. Slowly return to the starting position. Repeat __________ times. Complete this exercise __________ times a day. This information is not intended to replace advice given to you by your health care provider. Make sure you discuss any questions you have with your health care provider. Document Revised: 07/29/2018 Document Reviewed: 05/02/2018 Elsevier Patient Education  [place].  Shoulder Exercises Ask your health care provider which exercises are safe for you. Do exercises exactly as told by your health care provider and adjust them as directed. It is normal to feel mild stretching, pulling, tightness, or discomfort as you do  these exercises. Stop right away if you feel sudden pain or your pain gets worse. Do not begin these exercises until told by your health care provider. Stretching exercises External rotation and abduction This exercise is sometimes called corner stretch. This exercise rotates your arm outward (external rotation) and moves your arm out from your body (abduction). 1. Stand in a doorway with one of your feet slightly in front of the other. This is called a staggered stance. If you cannot reach your forearms to the door frame, stand facing a corner of a room. 2. Choose one of the following positions as told by your health care provider: ? Place your hands and forearms on the door frame above your head. ? Place your hands and forearms on the door frame at the height of your head. ? Place your hands on the door frame at the height of your elbows. 3. Slowly move your weight onto your front foot until you feel a stretch across your chest and in the front of your shoulders. Keep your head and chest upright and keep your abdominal muscles tight. 4. Hold for __________ seconds. 5. To release the stretch, shift  your weight to your back foot. Repeat __________ times. Complete this exercise __________ times a day. Extension, standing 1. Stand and hold a broomstick, a cane, or a similar object behind your back. ? Your hands should be a little wider than shoulder width apart. ? Your palms should face away from your back. 2. Keeping your elbows straight and your shoulder muscles relaxed, move the stick away from your body until you feel a stretch in your shoulders (extension). ? Avoid shrugging your shoulders while you move the stick. Keep your shoulder blades tucked down toward the middle of your back. 3. Hold for __________ seconds. 4. Slowly return to the starting position. Repeat __________ times. Complete this exercise __________ times a day. Range-of-motion exercises Pendulum  1. Stand near a wall or a  surface that you can hold onto for balance. 2. Bend at the waist and let your left / right arm hang straight down. Use your other arm to support you. Keep your back straight and do not lock your knees. 3. Relax your left / right arm and shoulder muscles, and move your hips and your trunk so your left / right arm swings freely. Your arm should swing because of the motion of your body, not because you are using your arm or shoulder muscles. 4. Keep moving your hips and trunk so your arm swings in the following directions, as told by your health care provider: ? Side to side. ? Forward and backward. ? In clockwise and counterclockwise circles. 5. Continue each motion for __________ seconds, or for as long as told by your health care provider. 6. Slowly return to the starting position. Repeat __________ times. Complete this exercise __________ times a day. Shoulder flexion, standing  1. Stand and hold a broomstick, a cane, or a similar object. Place your hands a little more than shoulder width apart on the object. Your left / right hand should be palm up, and your other hand should be palm down. 2. Keep your elbow straight and your shoulder muscles relaxed. Push the stick up with your healthy arm to raise your left / right arm in front of your body, and then over your head until you feel a stretch in your shoulder (flexion). ? Avoid shrugging your shoulder while you raise your arm. Keep your shoulder blade tucked down toward the middle of your back. 3. Hold for __________ seconds. 4. Slowly return to the starting position. Repeat __________ times. Complete this exercise __________ times a day. Shoulder abduction, standing 1. Stand and hold a broomstick, a cane, or a similar object. Place your hands a little more than shoulder width apart on the object. Your left / right hand should be palm up, and your other hand should be palm down. 2. Keep your elbow straight and your shoulder muscles relaxed. Push  the object across your body toward your left / right side. Raise your left / right arm to the side of your body (abduction) until you feel a stretch in your shoulder. ? Do not raise your arm above shoulder height unless your health care provider tells you to do that. ? If directed, raise your arm over your head. ? Avoid shrugging your shoulder while you raise your arm. Keep your shoulder blade tucked down toward the middle of your back. 3. Hold for __________ seconds. 4. Slowly return to the starting position. Repeat __________ times. Complete this exercise __________ times a day. Internal rotation  1. Place your left / right hand behind your  back, palm up. 2. Use your other hand to dangle an exercise band, a towel, or a similar object over your shoulder. Grasp the band with your left / right hand so you are holding on to both ends. 3. Gently pull up on the band until you feel a stretch in the front of your left / right shoulder. The movement of your arm toward the center of your body is called internal rotation. ? Avoid shrugging your shoulder while you raise your arm. Keep your shoulder blade tucked down toward the middle of your back. 4. Hold for __________ seconds. 5. Release the stretch by letting go of the band and lowering your hands. Repeat __________ times. Complete this exercise __________ times a day. Strengthening exercises External rotation  1. Sit in a stable chair without armrests. 2. Secure an exercise band to a stable object at elbow height on your left / right side. 3. Place a soft object, such as a folded towel or a small pillow, between your left / right upper arm and your body to move your elbow about 4 inches (10 cm) away from your side. 4. Hold the end of the exercise band so it is tight and there is no slack. 5. Keeping your elbow pressed against the soft object, slowly move your forearm out, away from your abdomen (external rotation). Keep your body steady so only your  forearm moves. 6. Hold for __________ seconds. 7. Slowly return to the starting position. Repeat __________ times. Complete this exercise __________ times a day. Shoulder abduction  1. Sit in a stable chair without armrests, or stand up. 2. Hold a __________ weight in your left / right hand, or hold an exercise band with both hands. 3. Start with your arms straight down and your left / right palm facing in, toward your body. 4. Slowly lift your left / right hand out to your side (abduction). Do not lift your hand above shoulder height unless your health care provider tells you that this is safe. ? Keep your arms straight. ? Avoid shrugging your shoulder while you do this movement. Keep your shoulder blade tucked down toward the middle of your back. 5. Hold for __________ seconds. 6. Slowly lower your arm, and return to the starting position. Repeat __________ times. Complete this exercise __________ times a day. Shoulder extension 1. Sit in a stable chair without armrests, or stand up. 2. Secure an exercise band to a stable object in front of you so it is at shoulder height. 3. Hold one end of the exercise band in each hand. Your palms should face each other. 4. Straighten your elbows and lift your hands up to shoulder height. 5. Step back, away from the secured end of the exercise band, until the band is tight and there is no slack. 6. Squeeze your shoulder blades together as you pull your hands down to the sides of your thighs (extension). Stop when your hands are straight down by your sides. Do not let your hands go behind your body. 7. Hold for __________ seconds. 8. Slowly return to the starting position. Repeat __________ times. Complete this exercise __________ times a day. Shoulder row 1. Sit in a stable chair without armrests, or stand up. 2. Secure an exercise band to a stable object in front of you so it is at waist height. 3. Hold one end of the exercise band in each hand.  Position your palms so that your thumbs are facing the ceiling (neutral position). 4.  Bend each of your elbows to a 90-degree angle (right angle) and keep your upper arms at your sides. 5. Step back until the band is tight and there is no slack. 6. Slowly pull your elbows back behind you. 7. Hold for __________ seconds. 8. Slowly return to the starting position. Repeat __________ times. Complete this exercise __________ times a day. Shoulder press-ups  1. Sit in a stable chair that has armrests. Sit upright, with your feet flat on the floor. 2. Put your hands on the armrests so your elbows are bent and your fingers are pointing forward. Your hands should be about even with the sides of your body. 3. Push down on the armrests and use your arms to lift yourself off the chair. Straighten your elbows and lift yourself up as much as you comfortably can. ? Move your shoulder blades down, and avoid letting your shoulders move up toward your ears. ? Keep your feet on the ground. As you get stronger, your feet should support less of your body weight as you lift yourself up. 4. Hold for __________ seconds. 5. Slowly lower yourself back into the chair. Repeat __________ times. Complete this exercise __________ times a day. Wall push-ups  1. Stand so you are facing a stable wall. Your feet should be about one arm-length away from the wall. 2. Lean forward and place your palms on the wall at shoulder height. 3. Keep your feet flat on the floor as you bend your elbows and lean forward toward the wall. 4. Hold for __________ seconds. 5. Straighten your elbows to push yourself back to the starting position. Repeat __________ times. Complete this exercise __________ times a day. This information is not intended to replace advice given to you by your health care provider. Make sure you discuss any questions you have with your health care provider. Document Revised: 07/29/2018 Document Reviewed:  05/06/2018 Elsevier Patient Education  [place].

## 2020-04-02 NOTE — Addendum Note (Signed)
Addended by: Thressa Sheller on: 04/02/2020 11:40 AM   Modules accepted: Orders

## 2020-04-02 NOTE — Progress Notes (Signed)
CD was made and given to [facility]

## 2020-04-05 ENCOUNTER — Ambulatory Visit (INDEPENDENT_AMBULATORY_CARE_PROVIDER_SITE_OTHER): Payer: Medicare Other | Admitting: Family

## 2020-04-05 ENCOUNTER — Encounter: Payer: Self-pay | Admitting: Family

## 2020-04-05 ENCOUNTER — Other Ambulatory Visit: Payer: Self-pay

## 2020-04-05 VITALS — BP 128/64 | HR 56 | Ht 67.0 in | Wt 171.2 lb

## 2020-04-05 DIAGNOSIS — I25118 Atherosclerotic heart disease of native coronary artery with other forms of angina pectoris: Secondary | ICD-10-CM

## 2020-04-05 DIAGNOSIS — E785 Hyperlipidemia, unspecified: Secondary | ICD-10-CM

## 2020-04-05 DIAGNOSIS — I10 Essential (primary) hypertension: Secondary | ICD-10-CM

## 2020-04-05 DIAGNOSIS — R0600 Dyspnea, unspecified: Secondary | ICD-10-CM | POA: Diagnosis not present

## 2020-04-05 DIAGNOSIS — R0609 Other forms of dyspnea: Secondary | ICD-10-CM

## 2020-04-05 MED ORDER — ISOSORBIDE MONONITRATE ER 60 MG PO TB24: 60.0000 mg | ORAL_TABLET | Freq: Every day | ORAL | 1 refills | Status: DC

## 2020-04-05 NOTE — Patient Instructions (Addendum)
Medication Instructions:  Your physician has recommended you make the following change in your medication:   CHANGE Isosorbide Mononitrate (Imdur) to 61m daily.   This is to help with your shortness of breath on exertion.   *If you need a refill on your cardiac medications before your next appointment, please call your pharmacy*  Lab Work: No lab work today.   Testing/Procedures: Your EKG today shows sinus bradycardia which is a stable finding. It is a regular heart beat.  Your physician has requested that you have an echocardiogram. Echocardiography is a painless test that uses sound waves to create images of your heart. It provides your doctor with information about the size and shape of your heart and how well your hearts chambers and valves are working. This procedure takes approximately one hour. There are no restrictions for this procedure.  Follow-Up: [facility] our [facility].  As part of our continuing mission to provide you with exceptional heart care, we have created designated Provider Care Teams.  These Care Teams include your primary Cardiologist (physician) and Advanced Practice Providers (APPs -  Physician Assistants and Nurse Practitioners) who all work together to provide you with the care you need, when you need it.  We recommend signing up for the patient portal called "MyChart".  Sign up information is provided on this After Visit Summary.  MyChart is used to connect with patients for Virtual Visits (Telemedicine).  Patients are able to view lab/test results, encounter notes, upcoming appointments, etc.  Non-urgent messages can be sent to your provider as well.   To learn more about what you can do with MyChart, go to [email or web address]    Your next appointment:   6 weeks or after echocardiogram  The format for your next appointment:   In Person  Provider:   You may see CNelva Bush MD or one of the following Advanced  Practice Providers on your designated Care Team:    CMurray Hodgkins NP  RChristell Faith PA-C  JMarrianne Mood PA-C  Cadence [place] CLaurann [place] NP  Other Instructions:   You have been referred to cardiac rehab. This is a combination program including monitored exercise, dietary education, and support group. We strongly recommend participating in the program. Expect a phone call from them in approximately 2 weeks. If you do not hear from them, [phone]

## 2020-04-05 NOTE — Progress Notes (Signed)
Office Visit    Patient Name: Erik Nicholson Date of Encounter: 04/05/2020  Primary Care Provider:  [name], [name], MD Primary Cardiologist:  Nelva Bush, MD Electrophysiologist:  None   Chief Complaint    Erik Nicholson is a 69 y.o. male with a hx of CAD s/p multiple PCI, peripheral vascular disease s/p bilateral lower extremity stenting followed by Dr. Doren Custard, ulcerative colitis, hypertension, hyperlipidemia, hepatitis C, prostate cancer, BPH presents today for follow up after cardiac cath.   Past Medical History    Past Medical History:  Diagnosis Date  . Anemia   . Arthritis   . BPH (benign prostatic hyperplasia)   . CAD (coronary artery disease)    x 7 cardiac stents  . Chronic back pain    L4/5 with chronic right leg pain   . Floater, vitreous, left   . Hx of hepatitis C    treated with Harvoni in 2014  . Hyperlipidemia   . Hypertension   . Neuromuscular disorder ([place])    nerve damage in back  . Neuropathy   . Prostate cancer ([place])   . PVD (peripheral vascular disease) ([place])    2 stents right leg and 1 in left   . Ulcerative colitis ([place])    with diarrhea   Past Surgical History:  Procedure Laterality Date  . back surgery     x 2 lumbar last 09/2016 in [place]  . CARDIAC CATHETERIZATION    . CATARACT EXTRACTION, BILATERAL    . COLONOSCOPY     last 09/2018 in [place]  . CORONARY BALLOON ANGIOPLASTY N/A 03/21/2020   Procedure: CORONARY BALLOON ANGIOPLASTY;  Surgeon: Nelva Bush, MD;  Location: [facility];  Service: Cardiovascular;  Laterality: N/A;  . HERNIA REPAIR     right   . INTRAVASCULAR PRESSURE WIRE/FFR STUDY N/A 03/21/2020   Procedure: INTRAVASCULAR PRESSURE WIRE/FFR STUDY;  Surgeon: Nelva Bush, MD;  Location: [facility];  Service: Cardiovascular;  Laterality: N/A;  . LEFT HEART CATH AND CORONARY ANGIOGRAPHY N/A 03/21/2020   Procedure: LEFT HEART CATH AND CORONARY ANGIOGRAPHY;  Surgeon: Nelva Bush, MD;  Location: [facility];  Service: Cardiovascular;  Laterality: N/A;  . PENILE PROSTHESIS IMPLANT    . PERCUTANEOUS CORONARY STENT INTERVENTION (PCI-S)     x 7-8 heart  . PERIPHERAL ARTERIAL STENT GRAFT    . PROSTATE BIOPSY    . pvd     with stenting x 2 right leg below knee and 1 left thigh   . SHOULDER ARTHROSCOPY Bilateral     Allergies  Allergies  Allergen Reactions  . Cymbalta [Duloxetine Hcl] Other (See Comments)    drowsiness    History of Present Illness    Erik Nicholson is a 69 y.o. male with a hx of CAD s/p multiple PCI, peripheral vascular disease s/p bilateral lower extremity stenting followed by Dr. Doren Custard, ulcerative colitis, hypertension, hyperlipidemia, hepatitis C, prostate cancer, BPH  last seen for cardiac catheterization 03/21/20.  Coronary artery disease history with report reported history of 7 coronary and 3 lower extremity stents placed while living in [place].  He was seen in clinic 03/13/2020 by Dr. Saunders Revel noting more frequent shortness of breath and chest heaviness with activity.  His metoprolol succinate was increased to 100 mg daily.  Subsequent cardiac catheterization performed 03/21/2020 demonstrated severe single-vessel CAD with up to 75% in-stent restenosis of the mid/distal RCA as well as 60 to 70% ostial stenoses of RPDA  and RPL 1.  Noted mild to moderate noncritical left coronary disease with most severe lesions small to moderate caliber D1 branch jailed by LAD stent with up to 70% stenosis.  Normal LV SF with mildly elevated filling pressures.  He underwent successful PTCA of mid/distal RCA with reduction of stenosis from 75 to 20% with TIMI-3 flow.  Stent placement not attempted due to multiple layers of stent incomplete expansion of 3.5 mm noncompliant balloon at 22 atm.  He was recommended for indefinite DAPT with aspirin and clopidogrel, though discontinuation of aspirin could be considered after 6 months of therapy.  He  presents today for follow-up.  Reports no recurrent chest pain, pressure, tightness.  Tells me his dyspnea on exertion is the same as previous to cardiac cath.  It is notable with occasions such as stairs.  No wheeze, cough.  He also endorses fatigue-tells me this is unchanged from prior to metoprolol dose change.  He checks his blood pressure at home with readings routinely 130s-140s/90s.   EKGs/Labs/Other Studies Reviewed:   The following studies were reviewed today: Cardiac Catheterization 03/21/2020:   Conclusions: 1. Severe single-vessel coronary artery disease with up to 75% in-stent restenosis of the mid/distal RCA (FFR 0.70), as well as 60-70% ostial stenoses of rPDA and rPL1. 2. Mild to moderate, non-critical left coronary artery disease.  The most severe lesion is a small to moderate caliber D1 branch that is jailed by LAD stent with up to 70% stenosis. 3. Normal left ventricular systolic function with mildly elevated filling pressure. 4. Successful PTCA of mid/distal RCA with reduction in stenosis from 75% to 20% with TIMI-3 flow.  Stent placement not attempted due to multiple layers of stent already present and incomplete expansion of 3.5 mm non-compliant balloon at 22 atm.   Recommendations: 1. Continue aggressive secondary prevention. 2. Indefinite dual antiplatelet therapy with aspirin and clopidogrel; discontinuation of aspirin could be considered after 6 months of therapy. 3. Anticipate same-day discharge if no post-catheterization complication occur.   Nelva Bush, MD [facility]   Diagnostic Dominance: Right  Intervention      _____________     EKG:  EKG is  ordered today.  The ekg ordered today demonstrates SB 56 bpm with 1st degree AV block (PR 220) and TWI in lead III.  Recent Labs: 01/30/2020: ALT 29; TSH 0.72 03/13/2020: BUN 20; Creatinine, Ser 0.93; Hemoglobin 12.9; Platelets 199; Potassium 3.8; Sodium 140  Recent Lipid Panel    Component Value  Date/Time   CHOL 126 01/30/2020 0803   TRIG 113.0 01/30/2020 0803   HDL 58.80 01/30/2020 0803   CHOLHDL 2 01/30/2020 0803   VLDL 22.6 01/30/2020 0803   LDLCALC 45 01/30/2020 0803    Home Medications   Current Meds  Medication Sig  . aspirin 81 MG chewable tablet Chew 1 tablet (81 mg total) by mouth daily.  Marland Kitchen atorvastatin (LIPITOR) 40 MG tablet Take 1 tablet (40 mg total) by mouth daily.  . clopidogrel (PLAVIX) 75 MG tablet Take 1 tablet (75 mg total) by mouth daily.  . cyclobenzaprine (FLEXERIL) 5 MG tablet Take 5 mg by mouth 2 (two) times daily.  . famotidine (PEPCID) 40 MG tablet TAKE 1 TABLET BY MOUTH TWICE A DAY (Patient taking differently: Take 40 mg by mouth 2 (two) times daily.)  . hyoscyamine (LEVBID) 0.375 MG 12 hr tablet TAKE 1 TABLET (0.375 MG TOTAL) BY MOUTH 2 (TWO) TIMES DAILY.  . isosorbide mononitrate (IMDUR) 30 MG 24 hr tablet TAKE 1 TABLET  BY MOUTH EVERY DAY  . lidocaine (LIDODERM) 5 % Place 1 patch onto the skin daily as needed (pain). Remove patch after 12 hours.  . mesalamine (LIALDA) 1.2 g EC tablet TAKE 2 TABLETS BY MOUTH DAILY WITH BREAKFAST.  . metoprolol succinate (TOPROL-XL) 100 MG 24 hr tablet Take 1 tablet (100 mg total) by mouth daily. Take with or immediately following a meal.  . nitroGLYCERIN (NITROSTAT) 0.4 MG SL tablet Place 1 tablet (0.4 mg total) under the tongue every 5 (five) minutes as needed for chest pain. If taking 2 call 911  . oxyCODONE-acetaminophen (PERCOCET) 5-325 MG tablet Take 1 tablet by mouth 2 (two) times daily as needed for severe pain.  . pantoprazole (PROTONIX) 40 MG tablet Take 1 tablet (40 mg total) by mouth daily.  . RESTASIS 0.05 % ophthalmic emulsion Place 1 drop into both eyes 2 (two) times daily.   . tamsulosin (FLOMAX) 0.4 MG CAPS capsule Take 0.4 mg by mouth daily.  Marland Kitchen telmisartan-hydrochlorothiazide (MICARDIS HCT) 40-12.5 MG tablet Take 1 tablet by mouth daily.     Review of Systems  All other systems reviewed and are  otherwise negative except as noted above.  Physical Exam    VS:  BP 128/64 (BP Location: Left Arm, Patient Position: Sitting, Cuff Size: Normal)   Pulse (!) 56   Ht _0  (1.702 m)   Wt 171 lb 4 oz (77.7 kg)   SpO2 98%   BMI 26.82 kg/m  , BMI Body mass index is 26.82 kg/m.  Wt Readings from Last 3 Encounters:  04/05/20 171 lb 4 oz (77.7 kg)  04/02/20 169 lb 1.9 oz (76.7 kg)  03/21/20 165 lb (74.8 kg)    GEN: Well nourished, well developed, in no acute distress. HEENT: normal. Neck: Supple, no JVD, carotid bruits, or masses. Cardiac: RRR, no murmurs, rubs, or gallops. No clubbing, cyanosis, edema.  Radials/DP/PT 2+ and equal bilaterally.  Respiratory:  Respirations regular and unlabored, clear to auscultation bilaterally. GI: Soft, nontender, nondistended. MS: No deformity or atrophy. Skin: Warm and dry, no rash. Neuro:  Strength and sensation are intact. Psych: Normal affect.  Assessment & Plan    1. DOE -Persistent despite revascularization with cardiac cath 03/21/2020. Etiology deconditioning, stable anginal symptoms, valvular etiology, diastolic dysfunction. Will plan for echocardiogram to rule out valvular etiology. Increase Imdur to 26m daily for antianginal benefit.   2. CAD- S/p recent PTCA of RCA with resolution of chest pain, still with dyspnea on exertion. R radial catheterization site healing appropriately.  Encouraged to participate in cardiac rehab.  GDMT includes aspirin, Plavix, beta blocker, statin, long acting nitrate.   3. PAD- No symptoms of worsening claudication.  Continue to follow with vascular surgery.  4. HLD - Atorvastatin increased to 40 mg daily during recent admission.  5. HTN-BP mildly elevated.  Increase Imdur to 60 mg daily, as above.  Disposition: Follow up in 6 week(s) with Dr. ESaunders Revelor APP  Signed, CLoel Dubonnet NP 04/05/2020, 10:29 AM [place]

## 2020-04-12 ENCOUNTER — Other Ambulatory Visit: Payer: Self-pay | Admitting: Cardiology

## 2020-04-24 ENCOUNTER — Ambulatory Visit: Payer: Medicare Other | Admitting: Gastroenterology

## 2020-04-24 ENCOUNTER — Other Ambulatory Visit: Payer: Self-pay | Admitting: Gastroenterology

## 2020-04-29 ENCOUNTER — Other Ambulatory Visit: Payer: Self-pay

## 2020-04-29 MED ORDER — MESALAMINE 1.2 G PO TBEC
DELAYED_RELEASE_TABLET | ORAL | 0 refills | Status: DC
Start: 2020-04-29 — End: 2020-05-28

## 2020-04-29 NOTE — Telephone Encounter (Signed)
Generic lialda refilled , patient has appointment early February.

## 2020-05-01 ENCOUNTER — Other Ambulatory Visit: Payer: Self-pay | Admitting: *Deleted

## 2020-05-01 MED ORDER — CLOPIDOGREL BISULFATE 75 MG PO TABS
75.0000 mg | ORAL_TABLET | Freq: Every day | ORAL | 0 refills | Status: DC
Start: 2020-05-01 — End: 2020-07-29

## 2020-05-02 ENCOUNTER — Telehealth: Payer: Self-pay | Admitting: Internal Medicine

## 2020-05-02 NOTE — Telephone Encounter (Signed)
Left message for patient to call back and schedule Medicare Annual Wellness Visit (AWV)   This should be a virtual visit only=30 minutes.  No hx of AWV; please schedule at anytime with Denisa O'Brien-Blaney at [facility]

## 2020-05-03 ENCOUNTER — Other Ambulatory Visit: Payer: Self-pay | Admitting: Gastroenterology

## 2020-05-08 ENCOUNTER — Other Ambulatory Visit: Payer: Medicare Other

## 2020-05-14 ENCOUNTER — Other Ambulatory Visit: Payer: Self-pay

## 2020-05-14 ENCOUNTER — Ambulatory Visit (INDEPENDENT_AMBULATORY_CARE_PROVIDER_SITE_OTHER): Payer: Medicare Other

## 2020-05-14 ENCOUNTER — Other Ambulatory Visit: Payer: Self-pay | Admitting: Gastroenterology

## 2020-05-14 DIAGNOSIS — K5180 Other ulcerative colitis without complications: Secondary | ICD-10-CM

## 2020-05-14 DIAGNOSIS — R0609 Other forms of dyspnea: Secondary | ICD-10-CM

## 2020-05-14 DIAGNOSIS — R0600 Dyspnea, unspecified: Secondary | ICD-10-CM | POA: Diagnosis not present

## 2020-05-14 DIAGNOSIS — R1084 Generalized abdominal pain: Secondary | ICD-10-CM

## 2020-05-14 DIAGNOSIS — D120 Benign neoplasm of cecum: Secondary | ICD-10-CM

## 2020-05-14 DIAGNOSIS — D128 Benign neoplasm of rectum: Secondary | ICD-10-CM

## 2020-05-14 DIAGNOSIS — D129 Benign neoplasm of anus and anal canal: Secondary | ICD-10-CM

## 2020-05-15 LAB — ECHOCARDIOGRAM COMPLETE
AR max vel: 2.66 cm2
AV Area VTI: 2.68 cm2
AV Area mean vel: 2.68 cm2
AV Mean grad: 4 mmHg
AV Peak grad: 7.6 mmHg
Ao pk vel: 1.38 m/s
Area-P 1/2: 2.91 cm2
Calc EF: 53.3 %
S' Lateral: 2.8 cm
Single Plane A2C EF: 53 %
Single Plane A4C EF: 53.4 %

## 2020-05-16 NOTE — Progress Notes (Signed)
Follow-up Outpatient Visit Date: 05/17/2020  Primary Care Provider: [name], [name], MD [place] [id number]  Chief Complaint: Shortness of breath  HPI:  [name] is a 70 y.o. male with history of coronary artery disease status post multiple PCI's, peripheral vascular disease status post bilateral lower extremity stenting followed by Dr. Scot Dock, ulcerative colitis, hypertension, hyperlipidemia, hepatitis C, prostate cancer, and BPH, who presents for follow-up of coronary artery disease following LHC/PTCA in early 03/2020.  I last saw [name] in late November, at which time he reported more frequent shortness of breath and chest heaviness with activity.  Subsequent catheterization showed severe in-stent restenosis of the mid/distal RCA that was hemodynamically significant.  This was treated with angioplasty; stent placement was not attempted due to multiple layers of overlapping stents with incomplete expansion.  He was seen for follow-up by Laurann Montana, NP on 04/05/20, at which time he reported resolution of chest pain.  However, his exertional dyspnea persisted.  Isosorbide mononitrate was increaed to 60 mg daily.  Echo earlier this week showed preserved LVEF with mild LVH and grade 1 diastolic dysfunction.  Today, [name] reports that he remains quite short of breath anytime that he walks at more than a slow pace.  He feels like it is hard to get air in and there is a hollow feeling in his chest.  He does not describe frank chest pain and overall feels like his prior chest discomfort has improved following PTCA to the RCA last month.  He does not feel like escalation of isosorbide mononitrate at his last visit has made any difference.  His mobility is also limited by chronic right leg pain and weakness.  He is unsure if he has any orthopnea as he typically does not lie flat.  He also notes that his colitis seems to have flared over the last two weeks.   He reports being compliant with his medications.  --------------------------------------------------------------------------------------------------  Cardiovascular History & Procedures: Cardiovascular Problems:  Coronary artery disease  Peripheral vascular disease  Risk Factors:  Known CAD and PAD, hypertension, hyperlipidemia, male gender, and age greater than 45  Cath/PCI:  LHC/PTCA (03/21/2020): LMCA with minimal luminal irregularities.  LAD with 30% ostial/proximal disease and 60-70% ostial stenoses of D1 and D2.  Prox/mid LAD stent widely patent.  LCx with 40% proximal/mid stenosis.  RCA with long segment of overlapping stents (prox -> distal) with 70-80% ISR involving the mid-distal segment.  60% ostial rPDA and 70% ostial rPL1 also noted.  Successful FFR-guided angioplasty of mid/distal RCA using a 3.5 x 15 mm Mariano Colon balloon at up to 22 atm with reduction in stenosis from 75% -> 20%.  RLE PTA (02/19/2014): Right mid and distal SFA laser atherectomy and PTA/stenting.  Runoff (02/01/2014): Severe right SFA stenosis with 2-vessel runoff.  Abdominal aortogran and runoff/PTA (04/26/2012): Right SFA stenting and left SFA PTA with DCB  LHC (02/25/2012): LMCA, LAD, and LCx "without any obtructive lesions). RCA with 40% mid vessel stenosis. Proximal and mid RCA stents patent. LVEF 60%  Abdominal aortogram and runoff (02/25/2012): 95% bilateral SFA stenoses.  CV Surgery:  None  EP Procedures and Devices:  None  Non-Invasive Evaluation(s):  TTE (05/14/2020): Normal LV size with mild LVH.  LVEF 55-60% with normal wall motion.  Grade 1 diastolic dysfunction.  Normal RV size and function.  No significant valvular abnormality.  ABIs (05/17/2019): Normal ABIs and TBI's bilaterally.  Myocardial perfusion stress test (03/07/2019): Normal study without ischemia  or scar. LVEF 56%.  TTE (03/02/2019): Normal LV size and wall thickness. LVEF 60-65% with grade 1 diastolic dysfunction.  Normal RV size and function. No significant valvular abnormality.  ABIs and bilateral lower extremity Doppler (03/02/2019): ABIs normal (1.1 bilaterally). Bilateral SFA stents are patent with mild stenosis in the midsegment of the right SFA stent.  Abdominal aorta duplex (07/22/2016): No AAA. Maximal aortic diameter 2.8 cm.  TTE (07/22/2016): Normal LV size with mild LVH and grade 1 diastolic dysfunction. LVEF 65%. Trace aortic and mitral regurgitation.  Pharmacologic MPI (07/21/2016): Normal myocardial perfusion without ischemia or scar.  Renal artery duplex (02/19/2012): Normal without evidence of renal artery stenosis.   Recent CV Pertinent Labs: Lab Results  Component Value Date   CHOL 126 01/30/2020   HDL 58.80 01/30/2020   LDLCALC 45 01/30/2020   TRIG 113.0 01/30/2020   CHOLHDL 2 01/30/2020   INR 1.1 (H) 12/22/2018   K 3.8 03/13/2020   BUN 20 03/13/2020   CREATININE 0.93 03/13/2020    Past medical and surgical history were reviewed and updated in EPIC.  Current Meds  Medication Sig  . aspirin 81 MG chewable tablet Chew 1 tablet (81 mg total) by mouth daily.  Marland Kitchen atorvastatin (LIPITOR) 40 MG tablet Take 1 tablet (40 mg total) by mouth daily.  . clopidogrel (PLAVIX) 75 MG tablet Take 1 tablet (75 mg total) by mouth daily.  . cyclobenzaprine (FLEXERIL) 5 MG tablet Take 5 mg by mouth 2 (two) times daily.  . famotidine (PEPCID) 40 MG tablet TAKE 1 TABLET BY MOUTH TWICE A DAY  . hyoscyamine (LEVBID) 0.375 MG 12 hr tablet TAKE 1 TABLET (0.375 MG TOTAL) BY MOUTH 2 (TWO) TIMES DAILY.  . isosorbide mononitrate (IMDUR) 60 MG 24 hr tablet Take 1 tablet (60 mg total) by mouth daily.  Marland Kitchen lidocaine (LIDODERM) 5 % Place 1 patch onto the skin daily as needed (pain). Remove patch after 12 hours.  . meloxicam (MOBIC) 7.5 MG tablet Take 7.5 mg by mouth daily.  . mesalamine (LIALDA) 1.2 g EC tablet TAKE 2 TABLETS BY MOUTH DAILY WITH BREAKFAST.  . metoprolol succinate (TOPROL-XL) 100 MG 24 hr  tablet Take 1 tablet (100 mg total) by mouth daily. Take with or immediately following a meal.  . oxyCODONE-acetaminophen (PERCOCET) 5-325 MG tablet Take 1 tablet by mouth 2 (two) times daily as needed for severe pain.  . pantoprazole (PROTONIX) 40 MG tablet TAKE 1 TABLET BY MOUTH EVERY DAY  . RESTASIS 0.05 % ophthalmic emulsion Place 1 drop into both eyes 2 (two) times daily.   . tamsulosin (FLOMAX) 0.4 MG CAPS capsule Take 0.4 mg by mouth daily.  Marland Kitchen telmisartan-hydrochlorothiazide (MICARDIS HCT) 40-12.5 MG tablet Take 1 tablet by mouth daily.    Allergies: Cymbalta [duloxetine hcl]  Social History   Tobacco Use  . Smoking status: Former Smoker    Packs/day: 1.00    Years: 40.00    Pack years: 40.00    Types: Cigarettes    Quit date: 04/20/1998    Years since quitting: 22.0  . Smokeless tobacco: Never Used  . Tobacco comment: quit in 2000s   Vaping Use  . Vaping Use: Never used  Substance Use Topics  . Alcohol use: Yes    Comment: occasional  . Drug use: Not Currently    Family History  Problem Relation Age of Onset  . Heart disease Mother   . Stroke Mother   . Breast cancer Mother   . Diabetes Sister   .  Diabetes Brother   . Colon cancer Neg Hx   . Esophageal cancer Neg Hx   . Inflammatory bowel disease Neg Hx   . Liver disease Neg Hx   . Pancreatic cancer Neg Hx   . Rectal cancer Neg Hx   . Stomach cancer Neg Hx     Review of Systems: A 12-system review of systems was performed and was negative except as noted in the HPI.  --------------------------------------------------------------------------------------------------  Physical Exam: BP 140/78 (BP Location: Left Arm, Patient Position: Sitting, Cuff Size: Normal)   Pulse (!) 57   Ht 5' 7" (1.702 m)   Wt 176 lb 8 oz (80.1 kg)   SpO2 97%   BMI 27.64 kg/m   General:  NAD. Neck: No JVD or HJR. Lungs: Clear to auscultation bilaterally without wheezes or crackles. Heart: Regular rate and rhythm without  murmurs, rubs, or gallops. Abdomen: Soft, nontender, nondistended. Extremities: No lower extremity edema.  EKG: Sinus bradycardia with borderline first-degree AV block and isolated PVC.  Inferior infarct.  PVC is new from 04/05/2020.  Otherwise, no significant change.  Lab Results  Component Value Date   WBC 5.8 03/13/2020   HGB 12.9 (L) 03/13/2020   HCT 38.1 03/13/2020   MCV 97 03/13/2020   PLT 199 03/13/2020    Lab Results  Component Value Date   NA 140 03/13/2020   K 3.8 03/13/2020   CL 102 03/13/2020   CO2 24 03/13/2020   BUN 20 03/13/2020   CREATININE 0.93 03/13/2020   GLUCOSE 103 (H) 03/13/2020   ALT 29 01/30/2020    Lab Results  Component Value Date   CHOL 126 01/30/2020   HDL 58.80 01/30/2020   LDLCALC 45 01/30/2020   TRIG 113.0 01/30/2020   CHOLHDL 2 01/30/2020    --------------------------------------------------------------------------------------------------  ASSESSMENT AND PLAN: Coronary artery disease with stable angina and shortness of breath: Overall, symptoms are about the same as his last visit following PTCA to RCA.  [name] does not report frank angina but still has quite a bit of exertional dyspnea.  Interval echo showed grade 1 diastolic dysfunction with preserved LVEF and no significant valvular abnormality to explain his symptoms.  He appears euvolemic on exam today.  We discussed escalation of his medical therapy, though [name] wishes to defer this.  As he has a 40-pack-year history of smoking, though having quit over 20 years ago, underlying lung disease is also a consideration.  We will obtain a PA and lateral chest radiograph today as well as refer [name] for pulmonary function testing.  If any abnormality is identified on these tests, we will need to consider referral to pulmonology.  Hypertension: Blood pressure borderline elevated today.  [name] wishes to defer medication changes at this time.  Lifestyle  modifications including sodium restriction were reinforced.  Hyperlipidemia: LDL well controlled on last check.  Continue high intensity statin therapy.  PAD: Chronic right leg pain/weakness that is likely multifactorial is stable.  Continue aggressive secondary prevention and ongoing follow-up with Dr. Scot Dock (vascular surgery).  Follow-up: Return to clinic in 3 months.  Nelva Bush, MD 05/17/2020 8:49 AM

## 2020-05-17 ENCOUNTER — Ambulatory Visit
Admission: RE | Admit: 2020-05-17 | Discharge: 2020-05-17 | Disposition: A | Discharge status: Home / Self Care | Payer: Medicare Other | Source: Ambulatory Visit | Attending: Internal Medicine | Admitting: Internal Medicine

## 2020-05-17 ENCOUNTER — Encounter: Payer: Self-pay | Admitting: Internal Medicine

## 2020-05-17 ENCOUNTER — Other Ambulatory Visit: Payer: Self-pay

## 2020-05-17 ENCOUNTER — Ambulatory Visit (INDEPENDENT_AMBULATORY_CARE_PROVIDER_SITE_OTHER): Payer: Medicare Other | Admitting: Internal Medicine

## 2020-05-17 VITALS — BP 140/78 | HR 57 | Ht 67.0 in | Wt 176.5 lb

## 2020-05-17 DIAGNOSIS — R0602 Shortness of breath: Secondary | ICD-10-CM | POA: Insufficient documentation

## 2020-05-17 NOTE — Patient Instructions (Signed)
Medication Instructions:  Your physician recommends that you continue on your current medications as directed. Please refer to the Current Medication list given to you today.  *If you need a refill on your cardiac medications before your next appointment, please call your pharmacy*  Lab Work:  COVID PRE- TEST: You will need a COVID TEST prior to the procedure:  LOCATION: [facility].  DATE/TIME:  __February 16, 2022 (Wednesday)____ anytime between 8am and 10 am.   If you have labs (blood work) drawn today and your tests are completely normal, you will receive your results only by: Marland Kitchen MyChart Message (if you have MyChart) OR . A paper copy in the mail If you have any lab test that is abnormal or we need to change your treatment, we will call you to review the results.  Testing/Procedures:  TODAY at [facility] as [facility]-  A chest x-ray takes a picture of the organs and structures inside the chest, including the heart, lungs, and blood vessels. This test can show several things, including, whether the heart is enlarges; whether fluid is building up in the lungs; and whether pacemaker / defibrillator leads are still in place.   PULMONARY FUNCTION TESTS- LOCATION: [facility] DATE/TIME: June 06, 2020 at 11:30 am  See handout for additional details.    Follow-Up: At [facility], you and your health needs are our priority.  As part of our continuing mission to provide you with exceptional heart care, we have created designated Provider Care Teams.  These Care Teams include your primary Cardiologist (physician) and Advanced Practice Providers (APPs -  Physician Assistants and Nurse Practitioners) who all work together to provide you with the care you need, when you need it.  We recommend signing up for the patient portal called "MyChart".  Sign up information is provided on this After Visit Summary.  MyChart is used to connect  with patients for Virtual Visits (Telemedicine).  Patients are able to view lab/test results, encounter notes, upcoming appointments, etc.  Non-urgent messages can be sent to your provider as well.   To learn more about what you can do with MyChart, go to [email or web address].    Your next appointment:   3 month(s)  The format for your next appointment:   In Person  Provider:   You may see Nelva Bush, MD or one of the following Advanced Practice Providers on your designated Care Team:    Murray Hodgkins, NP  Christell Faith, PA-C  Marrianne Mood, PA-C  Cadence Kathlen Mody, [place]  Laurann Montana, NP    Pulmonary Function Tests Pulmonary function tests (PFTs) are breathing tests that are used to measure how well your lungs work, find out what is causing your lung problems, and figure out the best treatment for you. You may have PFTs:  When you have an illness involving your lungs.  To watch for changes in your lung function over time if you have a long-term (chronic) lung disease.  If you are an Nature conservation officer. PFTs check the effects of being exposed to chemicals over a long period of time.  To check for diseases that affect the lungs, such as asthma or chronic obstructive pulmonary disease (COPD).  To check lung function before having surgery or other procedures.  To check your lungs, if you smoke.  To check if prescribed medicines or treatments are helping your lungs. Your results will be compared with the expected lung function of someone with healthy  lungs who is similar to you in several ways. These include age, sex, height, weight, and race or ethnicity. This is done to show how your lung function compares with normal lung function (percent predicted). The percent predicted helps your health care provider to know if your lung function is normal or not. If you have had PFTs done before, your health care provider will compare your current results with past results.  This shows if your lung function is better, worse, or the same as before. Tell a health care provider about:  Any allergies you have.  All medicines you are taking, including inhaler or nebulizer medicines, vitamins, herbs, eye drops, creams, and over-the-counter medicines.  Any blood disorders you have.  Any surgeries you have had, especially recent surgery of the eye, abdomen, or chest. These can make PFTs difficult or unsafe.  Any medical conditions you have, including chest pain or heart problems, tuberculosis, or respiratory infections such as pneumonia, a cold, or the flu.  Any fear of being in closed spaces (claustrophobia). Some of your tests may be in a closed space. What are the risks? Generally, this is a safe procedure. However, problems may occur, including:  Feeling light-headed due to fast, deep breathing known as over-breathing or hyperventilation.  An asthma attack from deep breathing. What happens before the procedure?  Take over-the-counter and prescription medicines only as told by your health care provider. If you take inhaler or nebulizer medicines, ask your health care provider which medicines you should take on the day of your testing. Some inhaler medicines may interfere with PFTs if they are taken shortly before the tests.  Follow instructions from your health care provider about eating or drinking restrictions. These may include avoiding eating large meals and avoiding using caffeine before the testing. Do not drink alcohol for up to 4 hours before the test.  Do not smoke for up to 4 hours before your procedure. This includes e-cigarettes.  Wear comfortable clothing that will not interfere with breathing.  Avoid exercise that takes a lot of effort (strenuous exercise) for at least 30 minutes before the testing. What happens during the procedure?  You will be given a soft nose clip to wear. This is done so all of your breaths will go through your mouth  instead of your nose.  You will be given a germ-free (sterile) mouthpiece. It will be attached to a machine, called a spirometer, that measures your breathing.  You will be asked to do various breathing exercises. The exercises will be done by breathing in (inhaling) and breathing out (exhaling). You may be asked to repeat the exercises several times before the testing is complete.  It is important to follow the instructions exactly to get accurate results. Make sure to blow as hard and as fast as you can when you are told to do so.  You may be given a medicine called a bronchodilator that makes the small air passages in your lungs larger. This medicine will make it easier for you to breathe. The tests will then be repeated after the medicine takes effect.  You will be watched carefully during the procedure for any problems, such as feeling faint or dizzy, or having trouble breathing. The procedure may vary among health care providers and hospitals.   What can I expect after the test procedure? It is up to you to get the results of your procedure. Ask your health care provider, or the department that is doing the procedure, when your  results will be ready. After you have received your results, talk with your health care provider about treatment options, if necessary. Follow these instructions at home: Do not use any products that contain nicotine or tobacco, such as cigarettes, e-cigarettes, and chewing tobacco. If you need help quitting, ask your health care provider. Summary  Pulmonary function tests (PFTs) are used to measure how well your lungs work, find out what is causing your lung problems, and figure out the best treatment for you.  If you have had PFTs done before, your health care provider will compare your current results with past results. This shows if your lung function is better, worse, or the same as before.  Wear comfortable clothing that will not interfere with breathing when  you are tested.  It is up to you to get the results of your procedure. After you have received them, talk with your health care provider about treatment options, if necessary. This information is not intended to replace advice given to you by your health care provider. Make sure you discuss any questions you have with your health care provider. Document Revised: 04/04/2019 Document Reviewed: 04/04/2019 Elsevier Patient Education  2021 [employer].

## 2020-05-28 ENCOUNTER — Telehealth: Payer: Self-pay

## 2020-05-28 ENCOUNTER — Other Ambulatory Visit (INDEPENDENT_AMBULATORY_CARE_PROVIDER_SITE_OTHER): Payer: Medicare Other

## 2020-05-28 ENCOUNTER — Ambulatory Visit: Payer: Medicare Other | Admitting: Gastroenterology

## 2020-05-28 ENCOUNTER — Encounter: Payer: Self-pay | Admitting: Gastroenterology

## 2020-05-28 VITALS — BP 122/66 | HR 76 | Ht 66.5 in | Wt 174.1 lb

## 2020-05-28 DIAGNOSIS — Z8719 Personal history of other diseases of the digestive system: Secondary | ICD-10-CM

## 2020-05-28 DIAGNOSIS — K648 Other hemorrhoids: Secondary | ICD-10-CM | POA: Diagnosis not present

## 2020-05-28 DIAGNOSIS — K3189 Other diseases of stomach and duodenum: Secondary | ICD-10-CM

## 2020-05-28 DIAGNOSIS — K31A0 Gastric intestinal metaplasia, unspecified: Secondary | ICD-10-CM

## 2020-05-28 DIAGNOSIS — R152 Fecal urgency: Secondary | ICD-10-CM | POA: Diagnosis not present

## 2020-05-28 LAB — BASIC METABOLIC PANEL
BUN: 17 mg/dL (ref 6–23)
CO2: 30 mEq/L (ref 19–32)
Calcium: 9.7 mg/dL (ref 8.4–10.5)
Chloride: 104 mEq/L (ref 96–112)
Creatinine, Ser: 1.01 mg/dL (ref 0.40–1.50)
GFR: 74.73 mL/min (ref >60.00)
Glucose, Bld: 117 mg/dL — ABNORMAL HIGH (ref 70–99)
Potassium: 3.3 mEq/L — ABNORMAL LOW (ref 3.5–5.1)
Sodium: 139 mEq/L (ref 135–145)

## 2020-05-28 LAB — CBC
HCT: 38.7 % — ABNORMAL LOW (ref 39.0–52.0)
Hemoglobin: 13 g/dL (ref 13.0–17.0)
MCHC: 33.7 g/dL (ref 30.0–36.0)
MCV: 99.8 fl (ref 78.0–100.0)
Platelets: 170 10*3/uL (ref 150.0–400.0)
RBC: 3.87 Mil/uL — ABNORMAL LOW (ref 4.22–5.81)
RDW: 13 % (ref 11.5–15.5)
WBC: 5.6 10*3/uL (ref 4.0–10.5)

## 2020-05-28 LAB — SEDIMENTATION RATE: Sed Rate: 6 mm/hr (ref 0–20)

## 2020-05-28 LAB — HIGH SENSITIVITY CRP: CRP, High Sensitivity: 0.65 mg/L (ref 0.000–5.000)

## 2020-05-28 MED ORDER — HYDROCORTISONE ACETATE 25 MG RE SUPP: RECTAL | 0 refills | Status: DC

## 2020-05-28 MED ORDER — MESALAMINE 1.2 G PO TBEC: DELAYED_RELEASE_TABLET | ORAL | 3 refills | Status: DC

## 2020-05-28 NOTE — Telephone Encounter (Signed)
Error

## 2020-05-28 NOTE — Telephone Encounter (Signed)
6 months after PCI.  Nelva Bush, MD [facility]

## 2020-05-28 NOTE — Telephone Encounter (Signed)
Dr. Saunders Revel Pt just had PCI without stent placement 03/21/20. We have been asked to hold plavix for colonoscopy. If this is a nonurgent procedure, when is the earliest he may hold plavix?

## 2020-05-28 NOTE — Patient Instructions (Addendum)
You have been scheduled for an endoscopy. Please follow written instructions given to you at your visit today. If you use inhalers (even only as needed), please bring them with you on the day of your procedure.   Your provider has requested that you go to the basement level for lab work before leaving today. Press "B" on the elevator. The lab is located at the first door on the left as you exit the elevator.  You will be contaced by our office prior to your procedure for directions on holding your Coumadin/Warfarin.  If you do not hear from our office 1 week prior to your scheduled procedure, please call [phone] to discuss.  We have sent the following medications to your pharmacy for you to pick up at your convenience: Anusol Suppositories   Use if necessary- Anusol Suppositories at bedtime x3 nights.   START: Fiber Con 1-2 tablets daily.    Toileting tips to help with your constipation - Drink at least 64-80 ounces of water/liquid per day. - Establish a time to try to move your bowels every day.  For many people, this is after a cup of coffee or after a meal such as breakfast. - Sit all of the way back on the toilet keeping your back fairly straight and while sitting up, try to rest the tops of your forearms on your upper thighs.   - Raising your feet with a step stool/squatty potty can be helpful to improve the angle that allows your stool to pass through the rectum. - Relax the rectum feeling it bulge toward the toilet water.  If you feel your rectum raising toward your body, you are contracting rather than relaxing. - Breathe in and slowly exhale. "Belly breath" by expanding your belly towards your belly button. Keep belly expanded as you gently direct pressure down and back to the anus.  A low pitched GRRR sound can assist with increasing intra-abdominal pressure.  - Repeat 3-4 times. If unsuccessful, contract the pelvic floor to restore normal tone and get off the toilet.  Avoid  excessive straining. - To reduce excessive wiping by teaching your anus to normally contract, place hands on outer aspect of knees and resist knee movement outward.  Hold 5-10 second then place hands just inside of knees and resist inward movement of knees.  Hold 5 seconds.  Repeat a few times each way.  Thank you for choosing me and [facility].  Dr. Rush Landmark

## 2020-05-28 NOTE — Telephone Encounter (Signed)
   Primary Cardiologist: Nelva Bush, MD  Chart reviewed as part of pre-operative protocol coverage.   Pt had PCI 03/21/20 and may not hold plavix for 6 months. Please reach back out to our office if procedure is rescheduled.   I will route this recommendation to the requesting party via Epic fax function and remove from pre-op pool. Please call with questions.  Tami Lin Yerania Chamorro, PA 05/28/2020, 5:10 PM

## 2020-05-28 NOTE — Telephone Encounter (Signed)
Request for surgical clearance:     Endoscopy Procedure  What type of surgery is being performed?     EUS  When is this surgery scheduled?     06/20/20  What type of clearance is required ?   Pharmacy  Are there any medications that need to be held prior to surgery and how long? Plavix x5 days before procedure   Practice name and name of physician performing surgery?      [facility]-Dr. Rush Landmark   What is your office phone and fax number?      Phone- [phone]  [phone]  Anesthesia type (None, local, MAC, general) ?       MAC

## 2020-05-30 NOTE — Telephone Encounter (Signed)
Understood.  I had not seen that he had just undergone a recent PCI/PTCA. Please let the patient know we will reschedule his EGD/EUS no sooner than June 2022. Thanks. GM

## 2020-05-30 NOTE — Telephone Encounter (Signed)
Dr. Rush Landmark, patient had PCI 03/21/20 and may not hold Plavix for 6 months. Pt is scheduled for 06/20/20 for EUS. Please advise.

## 2020-05-31 ENCOUNTER — Encounter: Payer: Self-pay | Admitting: Gastroenterology

## 2020-05-31 DIAGNOSIS — Z8719 Personal history of other diseases of the digestive system: Secondary | ICD-10-CM

## 2020-05-31 DIAGNOSIS — K3189 Other diseases of stomach and duodenum: Secondary | ICD-10-CM | POA: Insufficient documentation

## 2020-05-31 HISTORY — DX: Other diseases of stomach and duodenum: K31.89

## 2020-05-31 HISTORY — DX: Personal history of other diseases of the digestive system: Z87.19

## 2020-05-31 NOTE — Telephone Encounter (Signed)
Pt has been informed. EUS for 06/20/20  has been cancelled at the hospital. We will check back in June 2022 to see if clearance can be given at that time. Pt has been informed. Voiced understanding.

## 2020-05-31 NOTE — Progress Notes (Signed)
[place] VISIT   Primary Care Provider [name], [name], MD [place] [address] [phone]  Referring Provider [name], [name], MD [place],  [place] [address] [phone]  Patient Profile: Erik Nicholson is a 70 y.o. male with a pmh significant for reported pan ulcerative colitis (dx >16 years ago), CAD (on Plavix), PAD status post stenting, hypertension, hyperlipidemia, chronic back pain, BPH, prostate cancer status post brachytherapy, prior hepatitis C (status post treatment), pancreatic gastric rest, penile implant, osteoarthritis.  The patient presents to the [facility] for an evaluation and management of problem(s) noted below:  Problem List 1. History of chronic ulcerative colitis   2. Rectal urgency   3. Other hemorrhoids   4. Gastric nodule   5. Intestinal metaplasia of gastric mucosa     History of Present Illness Please see initial consultation note for full details of HPI.  Interval History Since the last time I saw the patient for his endoscopic evaluation, he has been treated for prostate cancer.  He is doing well.  From a GI standpoint the patient states that he continues to have intermittent "flares" that occur every few months though endoscopic evaluation suggest the patient has no evidence of active disease.  Is not been on any steroids.  He continues taking his mesalamine without any issues or complications.  He is hopeful he does not need to do yearly colonoscopies as he was doing previously.  He continues on his hyoscyamine that has been helpful for his rectal urgency at times.  No rectal bleeding has been noted.  He is not waking up at night to have a bowel movement.  GI Review of Systems Positive as above Negative for odynophagia, dysphagia, pyrosis, pain, change in bowel habits   Review of Systems General: Denies fevers/chills/weight loss  unintentionally HEENT: Denies oral lesions Cardiovascular: Denies chest pain Pulmonary: Denies shortness of breath Gastroenterological: See HPI Genitourinary: Denies darkened urine Hematological: Positive for easy bruising/bleeding due to Plavix use Endocrine: Denies temperature intolerance Dermatological: Denies jaundice Psychological: Mood is stable   Medications Current Outpatient Medications  Medication Sig Dispense Refill  . aspirin 81 MG chewable tablet Chew 1 tablet (81 mg total) by mouth daily.    Marland Kitchen atorvastatin (LIPITOR) 40 MG tablet Take 1 tablet (40 mg total) by mouth daily. 90 tablet 0  . clopidogrel (PLAVIX) 75 MG tablet Take 1 tablet (75 mg total) by mouth daily. 90 tablet 0  . cyclobenzaprine (FLEXERIL) 5 MG tablet Take 5 mg by mouth 2 (two) times daily.    . famotidine (PEPCID) 40 MG tablet TAKE 1 TABLET BY MOUTH TWICE A DAY 180 tablet 0  . hydrocortisone (ANUSOL-HC) 25 MG suppository Place 1 suppository (24m total ) rectally at bedtime for 3 nights. 12 suppository 0  . hyoscyamine (LEVBID) 0.375 MG 12 hr tablet TAKE 1 TABLET (0.375 MG TOTAL) BY MOUTH 2 (TWO) TIMES DAILY. 60 tablet 2  . isosorbide mononitrate (IMDUR) 60 MG 24 hr tablet Take 1 tablet (60 mg total) by mouth daily. 90 tablet 1  . lidocaine (LIDODERM) 5 % Place 1 patch onto the skin daily as needed (pain). Remove patch after 12 hours.    . meloxicam (MOBIC) 7.5 MG tablet Take 7.5 mg by mouth daily.    . metoprolol succinate (TOPROL-XL) 100 MG 24 hr tablet Take 1 tablet (100 mg total) by mouth daily. Take with or immediately following a meal. 30 tablet 5  . oxyCODONE-acetaminophen (PERCOCET) 5-325  MG tablet Take 1 tablet by mouth 2 (two) times daily as needed for severe pain. 14 tablet 0  . pantoprazole (PROTONIX) 40 MG tablet TAKE 1 TABLET BY MOUTH EVERY DAY 90 tablet 3  . RESTASIS 0.05 % ophthalmic emulsion Place 1 drop into both eyes 2 (two) times daily.     . tamsulosin (FLOMAX) 0.4 MG CAPS capsule Take  0.4 mg by mouth daily.    Marland Kitchen telmisartan-hydrochlorothiazide (MICARDIS HCT) 40-12.5 MG tablet Take 1 tablet by mouth daily. 90 tablet 3  . mesalamine (LIALDA) 1.2 g EC tablet TAKE 2 TABLETS BY MOUTH DAILY WITH BREAKFAST. 60 tablet 3  . nitroGLYCERIN (NITROSTAT) 0.4 MG SL tablet Place 1 tablet (0.4 mg total) under the tongue every 5 (five) minutes as needed for chest pain. If taking 2 call 911 (Patient not taking: Reported on 05/28/2020) 90 tablet 3   No current facility-administered medications for this visit.    Allergies Allergies  Allergen Reactions  . Cymbalta [Duloxetine Hcl] Other (See Comments)    drowsiness    Histories Past Medical History:  Diagnosis Date  . Anemia   . Arthritis   . BPH (benign prostatic hyperplasia)   . CAD (coronary artery disease)    x 7 cardiac stents  . Chronic back pain    L4/5 with chronic right leg pain   . Floater, vitreous, left   . Hx of hepatitis C    treated with Harvoni in 2014  . Hyperlipidemia   . Hypertension   . Neuromuscular disorder ([place])    nerve damage in back  . Neuropathy   . Prostate cancer ([place])   . PVD (peripheral vascular disease) ([place])    2 stents right leg and 1 in left   . Ulcerative colitis ([place])    with diarrhea   Past Surgical History:  Procedure Laterality Date  . back surgery     x 2 lumbar last 09/2016 in [place]  . CARDIAC CATHETERIZATION    . CATARACT EXTRACTION, BILATERAL    . COLONOSCOPY     last 09/2018 in [place]  . CORONARY BALLOON ANGIOPLASTY N/A 03/21/2020   Procedure: CORONARY BALLOON ANGIOPLASTY;  Surgeon: Nelva Bush, MD;  Location: [facility];  Service: Cardiovascular;  Laterality: N/A;  . HERNIA REPAIR     right   . INTRAVASCULAR PRESSURE WIRE/FFR STUDY N/A 03/21/2020   Procedure: INTRAVASCULAR PRESSURE WIRE/FFR STUDY;  Surgeon: Nelva Bush, MD;  Location: [facility];  Service: Cardiovascular;  Laterality: N/A;  . LEFT HEART CATH AND CORONARY  ANGIOGRAPHY N/A 03/21/2020   Procedure: LEFT HEART CATH AND CORONARY ANGIOGRAPHY;  Surgeon: Nelva Bush, MD;  Location: [facility];  Service: Cardiovascular;  Laterality: N/A;  . PENILE PROSTHESIS IMPLANT    . PERCUTANEOUS CORONARY STENT INTERVENTION (PCI-S)     x 7-8 heart  . PERIPHERAL ARTERIAL STENT GRAFT    . PROSTATE BIOPSY    . pvd     with stenting x 2 right leg below knee and 1 left thigh   . SHOULDER ARTHROSCOPY Bilateral    Social History   Socioeconomic History  . Marital status: Married    Spouse name: Not on file  . Number of children: 2  . Years of education: Not on file  . Highest education level: Not on file  Occupational History  . Not on file  Tobacco Use  . Smoking status: Former Smoker    Packs/day: 1.00    Years: 40.00  Pack years: 40.00    Types: Cigarettes    Quit date: 04/20/1998    Years since quitting: 22.1  . Smokeless tobacco: Never Used  . Tobacco comment: quit in 2000s   Vaping Use  . Vaping Use: Never used  Substance and Sexual Activity  . Alcohol use: Yes    Comment: occasional  . Drug use: Not Currently  . Sexual activity: Yes  Other Topics Concern  . Not on file  Social History Narrative   Moved from [place] in 10/2018   2 sons in 63s as of 11/24/2018    Married wife is DPR Pamala Hurry x 45 years as of 04/2019   Social Determinants of Health   Financial Resource Strain: Not on file  Food Insecurity: Not on file  Transportation Needs: Not on file  Physical Activity: Not on file  Stress: Not on file  Social Connections: Not on file  Intimate Partner Violence: Not on file   Family History  Problem Relation Age of Onset  . Heart disease Mother   . Stroke Mother   . Breast cancer Mother   . Diabetes Sister   . Diabetes Brother   . Colon cancer Neg Hx   . Esophageal cancer Neg Hx   . Inflammatory bowel disease Neg Hx   . Liver disease Neg Hx   . Pancreatic cancer Neg Hx   . Rectal cancer Neg Hx   . Stomach  cancer Neg Hx    I have reviewed his medical, social, and family history in detail and updated the electronic medical record as necessary.    PHYSICAL EXAMINATION  BP 122/66 (BP Location: Left Arm, Patient Position: Sitting, Cuff Size: Normal)   Pulse 76   Ht 5' 6.5" (1.689 m) Comment: height measured without shoes  Wt 174 lb 2 oz (79 kg)   BMI 27.68 kg/m  Wt Readings from Last 3 Encounters:  05/28/20 174 lb 2 oz (79 kg)  05/17/20 176 lb 8 oz (80.1 kg)  04/05/20 171 lb 4 oz (77.7 kg)  GEN: NAD, appears stated age, doesn't appear chronically ill PSYCH: Cooperative, without pressured speech EYE: Conjunctivae pink, sclerae anicteric ENT: Masked CV: Nontachycardic RESP: No audible wheezing GI: NABS, soft, NT/ND, without rebound or guarding, no HSM appreciated MSK/EXT: No lower extremity edema SKIN: No jaundice NEURO:  Alert & Oriented x 3, no focal deficits   REVIEW OF DATA  I reviewed the following data at the time of this encounter:  GI Procedures and Studies  February 2021 colonoscopy - Hemorrhoids and a prostate nodule found on digital rectal exam. - Five 2 to 4 mm polyps in the rectum and in the cecum, removed with a cold snare. Resected and retrieved. - Normal mucosa in the entire examined colon. Biopsied for IBD surveillance. - Non-bleeding non-thrombosed internal hemorrhoids.  February 2021 EGD - White nummular lesions in esophageal mucosa in midesophagus. Biopsied. No other gross lesions in esophagus. Z-line regular, 41 cm from the incisors. - Gastritis. Biopsied for HP. - Gastric submucosal nodule in the gastric antrum. Tunnel Biopsied. - No gross lesions in the duodenal bulb, in the first portion of the duodenum and in the second portion of the duodenum. Biopsied.  Pathology Diagnosis 1. Surgical [P], duodenal bulb, 2nd portion of duodenum, and distal duodenum - BENIGN DUODENAL MUCOSA - NO ACUTE INFLAMMATION, VILLOUS BLUNTING OR INCREASED INTRAEPITHELIAL  LYMPHOCYTES 2. Surgical [P], gastric antrum and gastric body - REACTIVE GASTROPATHY - NO H. PYLORI OR INTESTINAL METAPLASIA IDENTIFIED -  SEE COMMENT 3. Surgical [P], gastric nodule - REACTIVE GASTROPATHY - NO H. PYLORI OR INTESTINAL METAPLASIA IDENTIFIED - SEE COMMENT 4. Surgical [P], mid esophagus and distal esophagus - BENIGN SQUAMOUS MUCOSA - NO INCREASED INTRAEPITHELIAL EOSINOPHILS - SEE COMMENT 5. Surgical [P], colon, cecum, rectum, polyp (5) - MULTIPLE FRAGMENTS OF HYPERPLASTIC POLYP(S) - NO HIGH GRADE DYSPLASIA OR MALIGNANCY IDENTIFIED 6. Surgical [P], right colon BX - BENIGN COLONIC MUCOSA - NO ACTIVE INFLAMMATION, DYSPLASIA OR MALIGNANCY IDENTIFIED 7. Surgical [P], left colon BX - BENIGN COLONIC MUCOSA - NO ACTIVE INFLAMMATION, DYSPLASIA OR MALIGNANCY IDENTIFIED 8. Surgical [P], colon, rectum BX - BENIGN COLONIC MUCOSA - NO ACTIVE INFLAMMATION, DYSPLASIA OR MALIGNANCY IDENTIFIED  Laboratory Studies  Reviewed those in epic  Imaging Studies  No new imaging studies to review   ASSESSMENT  Mr. Kerney Hopfensperger is a 70 y.o. male with a pmh significant for reported pan ulcerative colitis (dx >16 years ago), CAD (on Plavix), PAD status post stenting, hypertension, hyperlipidemia, chronic back pain, BPH, prostate cancer status post brachytherapy, prior hepatitis C (status post treatment), pancreatic gastric rest, penile implant, osteoarthritis..  The patient is seen today for evaluation and management of:  1. History of chronic ulcerative colitis   2. Rectal urgency   3. Other hemorrhoids   4. Gastric nodule   5. Intestinal metaplasia of gastric mucosa    The patient is clinically and hemodynamically stable.  Most recent endoscopic evaluation shows evidence of endoscopic and clinical remission.  Although he continues to have "flares" is not clear to me that these are actually true bouts of informatory bowel disease occurring and he is not required steroid therapy for this.  Unless  the patient shows elevation in his inflammatory markers and or fecal calprotectin I would not plan for the patient to be initiated on steroids even for short course.  Patient's upper endoscopy shows evidence of a subepithelial lesion that will require further endoscopic evaluation with endoscopic ultrasound.  There has been discussion in the past the patient had a pancreatic rest but this does not look to be a pancreatic rest based on evaluation currently.  At this time endoscopic ultrasound will be scheduled in the coming months.  I do not think he needs another colonoscopy until 2023 and so he will get a reprieve on that for now.  We will need to get approval for Plavix hold for 5 days prior to endoscopic ultrasound.  Preprocedure labs will be obtained today.  The risks of an EUS including intestinal perforation, bleeding, infection, aspiration, and medication effects were discussed as was the possibility it may not give a definitive diagnosis if a biopsy is performed.  When a biopsy of the pancreas is done as part of the EUS, there is an additional risk of pancreatitis at the rate of about 1-2%.  It was explained that procedure related pancreatitis is typically mild, although it can be severe and even life threatening, which is why we do not perform random pancreatic biopsies and only biopsy a lesion/area we feel is concerning enough to warrant the risk.  The risks and benefits of endoscopic evaluation were discussed with the patient; these include but are not limited to the risk of perforation, infection, bleeding, missed lesions, lack of diagnosis, severe illness requiring hospitalization, as well as anesthesia and sedation related illnesses.  The patient is agreeable to proceed.  All patient questions were answered to the best of my ability, and the patient agrees to the aforementioned plan of action with follow-up  as indicated.   PLAN  Laboratories as outlined below Proceed with scheduling endoscopic  ultrasound to further evaluate subepithelial lesion -We will obtain approval for Plavix hold 5 days prior to procedure Continue Lialda 2.4 g daily Continue hyoscyamine   Orders Placed This Encounter  Procedures  . Procedural/ Surgical Case Request: UPPER ESOPHAGEAL ENDOSCOPIC ULTRASOUND (EUS)  . CBC  . Basic Metabolic Panel (BMET)  . Sedimentation rate  . CRP High sensitivity  . Ambulatory referral to Gastroenterology    New Prescriptions   HYDROCORTISONE (ANUSOL-HC) 25 MG SUPPOSITORY    Place 1 suppository (91m total ) rectally at bedtime for 3 nights.   Modified Medications   Modified Medication Previous Medication   MESALAMINE (LIALDA) 1.2 G EC TABLET mesalamine (LIALDA) 1.2 g EC tablet      TAKE 2 TABLETS BY MOUTH DAILY WITH BREAKFAST.    TAKE 2 TABLETS BY MOUTH DAILY WITH BREAKFAST.    Planned Follow Up Return in about 6 months (around 11/25/2020).  Total Time in Face-to-Face and in Coordination of Care for patient including independent/personal interpretation/review of prior testing, medical history, examination, medication adjustment, communicating results with the patient directly, and documentation with the EHR is 25 minutes.   GJustice Britain MD [facility] Office # [phone]

## 2020-06-03 ENCOUNTER — Other Ambulatory Visit: Payer: Self-pay

## 2020-06-03 ENCOUNTER — Other Ambulatory Visit: Payer: Self-pay | Admitting: Internal Medicine

## 2020-06-03 DIAGNOSIS — Z8719 Personal history of other diseases of the digestive system: Secondary | ICD-10-CM

## 2020-06-04 ENCOUNTER — Other Ambulatory Visit: Payer: Self-pay

## 2020-06-04 MED ORDER — POTASSIUM CHLORIDE ER 20 MEQ PO TBCR: 20.0000 meq | EXTENDED_RELEASE_TABLET | Freq: Every day | ORAL | 0 refills | Status: DC

## 2020-06-05 ENCOUNTER — Other Ambulatory Visit
Admission: RE | Admit: 2020-06-05 | Discharge: 2020-06-05 | Disposition: A | Discharge status: Home / Self Care | Payer: Medicare Other | Source: Ambulatory Visit | Attending: Internal Medicine | Admitting: Internal Medicine

## 2020-06-05 ENCOUNTER — Other Ambulatory Visit: Payer: Self-pay

## 2020-06-05 ENCOUNTER — Other Ambulatory Visit: Payer: Medicare Other

## 2020-06-05 DIAGNOSIS — Z01812 Encounter for preprocedural laboratory examination: Secondary | ICD-10-CM | POA: Insufficient documentation

## 2020-06-05 DIAGNOSIS — Z20822 Contact with and (suspected) exposure to covid-19: Secondary | ICD-10-CM | POA: Diagnosis not present

## 2020-06-06 ENCOUNTER — Other Ambulatory Visit: Payer: Self-pay

## 2020-06-06 ENCOUNTER — Ambulatory Visit: Payer: Medicare Other | Attending: Internal Medicine

## 2020-06-06 DIAGNOSIS — R0602 Shortness of breath: Secondary | ICD-10-CM

## 2020-06-06 LAB — SARS CORONAVIRUS 2 (TAT 6-24 HRS): SARS Coronavirus 2: NEGATIVE

## 2020-06-06 MED ORDER — ALBUTEROL SULFATE (2.5 MG/3ML) 0.083% IN NEBU
2.5000 mg | INHALATION_SOLUTION | Freq: Once | RESPIRATORY_TRACT | Status: DC
  Filled 2020-06-06: qty 3

## 2020-06-10 ENCOUNTER — Other Ambulatory Visit: Payer: Self-pay | Admitting: Gastroenterology

## 2020-06-17 ENCOUNTER — Other Ambulatory Visit (HOSPITAL_COMMUNITY): Payer: Medicare Other

## 2020-06-20 ENCOUNTER — Ambulatory Visit (HOSPITAL_COMMUNITY): Admit: 2020-06-20 | Payer: Medicare Other | Admitting: Gastroenterology

## 2020-06-20 ENCOUNTER — Encounter (HOSPITAL_COMMUNITY): Payer: Self-pay

## 2020-06-20 SURGERY — UPPER ESOPHAGEAL ENDOSCOPIC ULTRASOUND (EUS)
Anesthesia: Monitor Anesthesia Care

## 2020-07-02 LAB — PULMONARY FUNCTION TEST ARMC ONLY
DL/VA % pred: 67 %
DL/VA: 2.78 ml/min/mmHg/L
DLCO unc % pred: 66 %
DLCO unc: 15.28 ml/min/mmHg
FEF 25-75 Post: 2.86 L/sec
FEF 25-75 Pre: 1.46 L/sec
FEF2575-%Change-Post: 96 %
FEF2575-%Pred-Post: 134 %
FEF2575-%Pred-Pre: 68 %
FEV1-%Change-Post: 15 %
FEV1-%Pred-Post: 98 %
FEV1-%Pred-Pre: 85 %
FEV1-Post: 2.43 L
FEV1-Pre: 2.1 L
FEV1FVC-%Change-Post: 7 %
FEV1FVC-%Pred-Pre: 97 %
FEV6-%Change-Post: 7 %
FEV6-%Pred-Post: 97 %
FEV6-%Pred-Pre: 90 %
FEV6-Post: 3.06 L
FEV6-Pre: 2.83 L
FEV6FVC-%Change-Post: 0 %
FEV6FVC-%Pred-Post: 105 %
FEV6FVC-%Pred-Pre: 105 %
FVC-%Change-Post: 8 %
FVC-%Pred-Post: 92 %
FVC-%Pred-Pre: 85 %
FVC-Post: 3.06 L
FVC-Pre: 2.83 L
Post FEV1/FVC ratio: 79 %
Post FEV6/FVC ratio: 100 %
Pre FEV1/FVC ratio: 74 %
Pre FEV6/FVC Ratio: 100 %
RV % pred: 88 %
RV: 2.01 L
TLC % pred: 82 %
TLC: 5.24 L

## 2020-07-03 ENCOUNTER — Telehealth: Payer: Self-pay | Admitting: *Deleted

## 2020-07-03 DIAGNOSIS — R0602 Shortness of breath: Secondary | ICD-10-CM

## 2020-07-03 NOTE — Telephone Encounter (Signed)
The patient has been notified of the result and verbalized understanding.  All questions (if any) were answered. Referral placed.  Patient given the number for [facility] and he will call Friday morning to schedule appointment if he has not heard anything before then.

## 2020-07-03 NOTE — Telephone Encounter (Signed)
-----   Message from Nelva Bush, MD sent at 07/03/2020  8:48 AM EDT ----- Please let [name] know that his pulmonary function tests suggest that he may have an element of obstructive lung disease (I.e. asthma or COPD) contributing to his shortness of breath.  If he continues to have significant shortness of breath, I recommend that we refer him to pulmonology for further evaluation.

## 2020-07-09 ENCOUNTER — Ambulatory Visit (INDEPENDENT_AMBULATORY_CARE_PROVIDER_SITE_OTHER): Payer: Medicare Other

## 2020-07-09 VITALS — Ht 66.5 in | Wt 174.0 lb

## 2020-07-09 DIAGNOSIS — Z0000 Encounter for general adult medical examination without abnormal findings: Secondary | ICD-10-CM | POA: Diagnosis not present

## 2020-07-09 NOTE — Patient Instructions (Addendum)
[name] , Thank you for taking time to come for your Medicare Wellness Visit. I appreciate your ongoing commitment to your health goals. Please review the following plan we discussed and let me know if I can assist you in the future.   These are the goals we discussed: Goals      Patient Stated   .  Increase physical activity (pt-stated)      I would like to walk more for exercise when I can       This is a list of the screening recommended for you and due dates:  Health Maintenance  Topic Date Due  . Tetanus Vaccine  Never done  . Pneumonia vaccines (1 of 2 - PCV13) Never done  . Colon Cancer Screening  05/29/2020  . COVID-19 Vaccine (4 - Booster for Pfizer series) 09/27/2020  . Flu Shot  Completed  .  Hepatitis C: One time screening is recommended by Center for Disease Control  (CDC) for  adults born from [birth year] through 1967.   Completed  . HPV Vaccine  Aged Out    Immunizations Immunization History  Administered Date(s) Administered  . Fluad Quad(high Dose 65+) 01/04/2019, 01/29/2020  . Influenza, High Dose Seasonal PF 01/13/2018  . PFIZER(Purple Top)SARS-COV-2 Vaccination 06/18/2019, 07/12/2019, 03/29/2020   Keep all routine maintenance appointments.   Follow up 08/06/20 @ 9:00  Advanced directives: End of life planning; Advance aging; Advanced directives discussed.  Copy of current HCPOA/Living Will requested.    Follow up in one year for your annual wellness visit.   Preventive Care 60 Years and Older, Male Preventive care refers to lifestyle choices and visits with your health care provider that can promote health and wellness. What does preventive care include?  A yearly physical exam. This is also called an annual well check.  Dental exams once or twice a year.  Routine eye exams. Ask your health care provider how often you should have your eyes checked.  Personal lifestyle choices, including:  Daily care of your teeth and gums.  Regular physical  activity.  Eating a healthy diet.  Avoiding tobacco and drug use.  Limiting alcohol use.  Practicing safe sex.  Taking low doses of aspirin every day.  Taking vitamin and mineral supplements as recommended by your health care provider. What happens during an annual well check? The services and screenings done by your health care provider during your annual well check will depend on your age, overall health, lifestyle risk factors, and family history of disease. Counseling  Your health care provider may ask you questions about your:  Alcohol use.  Tobacco use.  Drug use.  Emotional well-being.  Home and relationship well-being.  Sexual activity.  Eating habits.  History of falls.  Memory and ability to understand (cognition).  Work and work Statistician. Screening  You may have the following tests or measurements:  Height, weight, and BMI.  Blood pressure.  Lipid and cholesterol levels. These may be checked every 5 years, or more frequently if you are over 71 years old.  Skin check.  Lung cancer screening. You may have this screening every year starting at age 30 if you have a 30-pack-year history of smoking and currently smoke or have quit within the past 15 years.  Fecal occult blood test (FOBT) of the stool. You may have this test every year starting at age 44.  Flexible sigmoidoscopy or colonoscopy. You may have a sigmoidoscopy every 5 years or a colonoscopy every 10 years  starting at age 75.  Prostate cancer screening. Recommendations will vary depending on your family history and other risks.  Hepatitis C blood test.  Hepatitis B blood test.  Sexually transmitted disease (STD) testing.  Diabetes screening. This is done by checking your blood sugar (glucose) after you have not eaten for a while (fasting). You may have this done every 1-3 years.  Abdominal aortic aneurysm (AAA) screening. You may need this if you are a current or former  smoker.  Osteoporosis. You may be screened starting at age 43 if you are at high risk. Talk with your health care provider about your test results, treatment options, and if necessary, the need for more tests. Vaccines  Your health care provider may recommend certain vaccines, such as:  Influenza vaccine. This is recommended every year.  Tetanus, diphtheria, and acellular pertussis (Tdap, Td) vaccine. You may need a Td booster every 10 years.  Zoster vaccine. You may need this after age 27.  Pneumococcal 13-valent conjugate (PCV13) vaccine. One dose is recommended after age 21.  Pneumococcal polysaccharide (PPSV23) vaccine. One dose is recommended after age 76. Talk to your health care provider about which screenings and vaccines you need and how often you need them. This information is not intended to replace advice given to you by your health care provider. Make sure you discuss any questions you have with your health care provider. Document Released: 05/03/2015 Document Revised: 12/25/2015 Document Reviewed: 02/05/2015 Elsevier Interactive Patient Education  2017 [employer] Prevention in the Home Falls can cause injuries. They can happen to people of all ages. There are many things you can do to make your home safe and to help prevent falls. What can I do on the outside of my home?  Regularly fix the edges of walkways and driveways and fix any cracks.  Remove anything that might make you trip as you walk through a door, such as a raised step or threshold.  Trim any bushes or trees on the path to your home.  Use bright outdoor lighting.  Clear any walking paths of anything that might make someone trip, such as rocks or tools.  Regularly check to see if handrails are loose or broken. Make sure that both sides of any steps have handrails.  Any raised decks and porches should have guardrails on the edges.  Have any leaves, snow, or ice cleared regularly.  Use sand or  salt on walking paths during winter.  Clean up any spills in your garage right away. This includes oil or grease spills. What can I do in the bathroom?  Use night lights.  Install grab bars by the toilet and in the tub and shower. Do not use towel bars as grab bars.  Use non-skid mats or decals in the tub or shower.  If you need to sit down in the shower, use a plastic, non-slip stool.  Keep the floor dry. Clean up any water that spills on the floor as soon as it happens.  Remove soap buildup in the tub or shower regularly.  Attach bath mats securely with double-sided non-slip rug tape.  Do not have throw rugs and other things on the floor that can make you trip. What can I do in the bedroom?  Use night lights.  Make sure that you have a light by your bed that is easy to reach.  Do not use any sheets or blankets that are too big for your bed. They should not hang down  onto the floor.  Have a firm chair that has side arms. You can use this for support while you get dressed.  Do not have throw rugs and other things on the floor that can make you trip. What can I do in the kitchen?  Clean up any spills right away.  Avoid walking on wet floors.  Keep items that you use a lot in easy-to-reach places.  If you need to reach something above you, use a strong step stool that has a grab bar.  Keep electrical cords out of the way.  Do not use floor polish or wax that makes floors slippery. If you must use wax, use non-skid floor wax.  Do not have throw rugs and other things on the floor that can make you trip. What can I do with my stairs?  Do not leave any items on the stairs.  Make sure that there are handrails on both sides of the stairs and use them. Fix handrails that are broken or loose. Make sure that handrails are as long as the stairways.  Check any carpeting to make sure that it is firmly attached to the stairs. Fix any carpet that is loose or worn.  Avoid having  throw rugs at the top or bottom of the stairs. If you do have throw rugs, attach them to the floor with carpet tape.  Make sure that you have a light switch at the top of the stairs and the bottom of the stairs. If you do not have them, ask someone to add them for you. What else can I do to help prevent falls?  Wear shoes that:  Do not have high heels.  Have rubber bottoms.  Are comfortable and fit you well.  Are closed at the toe. Do not wear sandals.  If you use a stepladder:  Make sure that it is fully opened. Do not climb a closed stepladder.  Make sure that both sides of the stepladder are locked into place.  Ask someone to hold it for you, if possible.  Clearly mark and make sure that you can see:  Any grab bars or handrails.  First and last steps.  Where the edge of each step is.  Use tools that help you move around (mobility aids) if they are needed. These include:  Canes.  Walkers.  Scooters.  Crutches.  Turn on the lights when you go into a dark area. Replace any light bulbs as soon as they burn out.  Set up your furniture so you have a clear path. Avoid moving your furniture around.  If any of your floors are uneven, fix them.  If there are any pets around you, be aware of where they are.  Review your medicines with your doctor. Some medicines can make you feel dizzy. This can increase your chance of falling. Ask your doctor what other things that you can do to help prevent falls. This information is not intended to replace advice given to you by your health care provider. Make sure you discuss any questions you have with your health care provider. Document Released: 01/31/2009 Document Revised: 09/12/2015 Document Reviewed: 05/11/2014 Elsevier Interactive Patient Education  2017 [employer].

## 2020-07-09 NOTE — Progress Notes (Signed)
Subjective:   Erik Nicholson is a 70 y.o. male who presents for an Initial Medicare Annual Wellness Visit.  Review of Systems    No ROS.  Medicare Wellness Virtual Visit.    Cardiac Risk Factors include: advanced age (>62mn, >>39women);male gender;hypertension     Objective:    Today's Vitals   07/09/20 1136  Weight: 174 lb (78.9 kg)  Height: 5' 6.5" (1.689 m)   Body mass index is 27.66 kg/m.  Advanced Directives 07/09/2020 03/21/2020 05/17/2019 03/28/2019 02/15/2019  Does Patient Have a Medical Advance Directive? Yes Yes Yes Yes Yes  Type of [place]aramed[place]of [place] will [place] will [place] will -  Does patient want to make changes to medical advance directive? No - Patient declined - No - Patient declined No - Patient declined -  Copy of [place] Chart? No - copy requested - - No - copy requested -    Current Medications (verified) Outpatient Encounter Medications as of 07/09/2020  Medication Sig  . potassium chloride 20 MEQ TBCR Take 20 mEq by mouth daily for 7 days.  .Marland Kitchenaspirin 81 MG chewable tablet Chew 1 tablet (81 mg total) by mouth daily.  .Marland Kitchenatorvastatin (LIPITOR) 40 MG tablet Take 1 tablet (40 mg total) by mouth daily.  . clopidogrel (PLAVIX) 75 MG tablet Take 1 tablet (75 mg total) by mouth daily.  . cyclobenzaprine (FLEXERIL) 5 MG tablet Take 5 mg by mouth 2 (two) times daily.  . famotidine (PEPCID) 40 MG tablet TAKE 1 TABLET BY MOUTH TWICE A DAY  . hydrocortisone (ANUSOL-HC) 25 MG suppository Place 1 suppository (27mtotal ) rectally at bedtime for 3 nights.  . hyoscyamine (LEVBID) 0.375 MG 12 hr tablet TAKE 1 TABLET (0.375 MG TOTAL) BY MOUTH 2 (TWO) TIMES DAILY.  . isosorbide mononitrate (IMDUR) 60 MG 24 hr tablet Take 1 tablet (60 mg total) by mouth daily.  . Marland Kitchenidocaine (LIDODERM) 5 % Place 1 patch onto the skin daily as needed  (pain). Remove patch after 12 hours.  . meloxicam (MOBIC) 7.5 MG tablet Take 7.5 mg by mouth daily.  . mesalamine (LIALDA) 1.2 g EC tablet TAKE 2 TABLETS BY MOUTH DAILY WITH BREAKFAST.  . metoprolol succinate (TOPROL-XL) 100 MG 24 hr tablet Take 1 tablet (100 mg total) by mouth daily. Take with or immediately following a meal.  . nitroGLYCERIN (NITROSTAT) 0.4 MG SL tablet Place 1 tablet (0.4 mg total) under the tongue every 5 (five) minutes as needed for chest pain. If taking 2 call 911 (Patient not taking: Reported on 05/28/2020)  . oxyCODONE-acetaminophen (PERCOCET) 5-325 MG tablet Take 1 tablet by mouth 2 (two) times daily as needed for severe pain.  . pantoprazole (PROTONIX) 40 MG tablet TAKE 1 TABLET BY MOUTH EVERY DAY  . RESTASIS 0.05 % ophthalmic emulsion Place 1 drop into both eyes 2 (two) times daily.   . tamsulosin (FLOMAX) 0.4 MG CAPS capsule Take 0.4 mg by mouth daily.  . Marland Kitchenelmisartan-hydrochlorothiazide (MICARDIS HCT) 40-12.5 MG tablet Take 1 tablet by mouth daily.   Facility-Administered Encounter Medications as of 07/09/2020  Medication  . albuterol (PROVENTIL) (2.5 MG/3ML) 0.083% nebulizer solution 2.5 mg    Allergies (verified) Cymbalta [duloxetine hcl]   History: Past Medical History:  Diagnosis Date  . Anemia   . Arthritis   . BPH (benign prostatic hyperplasia)   . CAD (coronary artery disease)    x  7 cardiac stents  . Chronic back pain    L4/5 with chronic right leg pain   . Floater, vitreous, left   . Hx of hepatitis C    treated with Harvoni in 2014  . Hyperlipidemia   . Hypertension   . Neuromuscular disorder ([place])    nerve damage in back  . Neuropathy   . Prostate cancer ([place])   . PVD (peripheral vascular disease) ([place])    2 stents right leg and 1 in left   . Ulcerative colitis ([place])    with diarrhea   Past Surgical History:  Procedure Laterality Date  . back surgery     x 2 lumbar last 09/2016 in [place]  . CARDIAC CATHETERIZATION    .  CATARACT EXTRACTION, BILATERAL    . COLONOSCOPY     last 09/2018 in [place]  . CORONARY BALLOON ANGIOPLASTY N/A 03/21/2020   Procedure: CORONARY BALLOON ANGIOPLASTY;  Surgeon: Nelva Bush, MD;  Location: [facility];  Service: Cardiovascular;  Laterality: N/A;  . HERNIA REPAIR     right   . INTRAVASCULAR PRESSURE WIRE/FFR STUDY N/A 03/21/2020   Procedure: INTRAVASCULAR PRESSURE WIRE/FFR STUDY;  Surgeon: Nelva Bush, MD;  Location: [facility];  Service: Cardiovascular;  Laterality: N/A;  . LEFT HEART CATH AND CORONARY ANGIOGRAPHY N/A 03/21/2020   Procedure: LEFT HEART CATH AND CORONARY ANGIOGRAPHY;  Surgeon: Nelva Bush, MD;  Location: [facility];  Service: Cardiovascular;  Laterality: N/A;  . PENILE PROSTHESIS IMPLANT    . PERCUTANEOUS CORONARY STENT INTERVENTION (PCI-S)     x 7-8 heart  . PERIPHERAL ARTERIAL STENT GRAFT    . PROSTATE BIOPSY    . pvd     with stenting x 2 right leg below knee and 1 left thigh   . SHOULDER ARTHROSCOPY Bilateral    Family History  Problem Relation Age of Onset  . Heart disease Mother   . Stroke Mother   . Breast cancer Mother   . Diabetes Sister   . Diabetes Brother   . Colon cancer Neg Hx   . Esophageal cancer Neg Hx   . Inflammatory bowel disease Neg Hx   . Liver disease Neg Hx   . Pancreatic cancer Neg Hx   . Rectal cancer Neg Hx   . Stomach cancer Neg Hx    Social History   Socioeconomic History  . Marital status: Married    Spouse name: Not on file  . Number of children: 2  . Years of education: Not on file  . Highest education level: Not on file  Occupational History  . Not on file  Tobacco Use  . Smoking status: Former Smoker    Packs/day: 1.00    Years: 40.00    Pack years: 40.00    Types: Cigarettes    Quit date: 04/20/1998    Years since quitting: 22.2  . Smokeless tobacco: Never Used  . Tobacco comment: quit in 2000s   Vaping Use  . Vaping Use: Never used  Substance and Sexual  Activity  . Alcohol use: Yes    Comment: occasional  . Drug use: Not Currently  . Sexual activity: Yes  Other Topics Concern  . Not on file  Social History Narrative   Moved from [place] in 10/2018   2 sons in 62s as of 11/24/2018    Married wife is DPR Pamala Hurry x 45 years as of 04/2019   Social Determinants of Health  Financial Resource Strain: Low Risk   . Difficulty of Paying Living Expenses: Not hard at all  Food Insecurity: No Food Insecurity  . Worried About Charity fundraiser in the Last Year: Never true  . Ran Out of Food in the Last Year: Never true  Transportation Needs: No Transportation Needs  . Lack of Transportation (Medical): No  . Lack of Transportation (Non-Medical): No  Physical Activity: Not on file  Stress: No Stress Concern Present  . Feeling of Stress : Not at all  Social Connections: Unknown  . Frequency of Communication with Friends and Family: Not on file  . Frequency of Social Gatherings with Friends and Family: Not on file  . Attends Religious Services: Not on file  . Active Member of Clubs or Organizations: Not on file  . Attends Archivist Meetings: Not on file  . Marital Status: Married    Tobacco Counseling Counseling given: Not Answered Comment: quit in 2000s    Clinical Intake:           Diabetes: No  How often do you need to have someone help you when you read instructions, pamphlets, or other written materials from your doctor or pharmacy?: 1 - Never    Interpreter Needed?: No      Activities of Daily Living In your present state of health, do you have any difficulty performing the following activities: 07/09/2020  Hearing? N  Vision? N  Difficulty concentrating or making decisions? N  Walking or climbing stairs? Y  Comment SOBOE  Dressing or bathing? N  Doing errands, shopping? N  Preparing Food and eating ? N  Using the Toilet? N  In the past six months, have you accidently leaked urine? Y   Comment Managed with medication  Do you have problems with loss of bowel control? N  Managing your Medications? N  Managing your Finances? N  Housekeeping or managing your Housekeeping? N  Some recent data might be hidden    Patient Care Team: [name], [name], MD as PCP - General (Internal Medicine) End, Harrell Gave, MD as PCP - Cardiology (Cardiology) Kathie Rhodes, MD (Inactive) as Consulting Physician (Urology) Tyler Pita, MD as Consulting Physician (Radiation Oncology) Cira Rue, RN Nurse Navigator as Registered Nurse (Medical Oncology)  Indicate any recent Medical Services you may have received from other than [facility] providers in the past year (date may be approximate).     Assessment:   This is a routine wellness examination for Walls.  I connected with Shahir today by telephone and verified that I am speaking with the correct person using two identifiers. Location patient: home Location provider: work Persons participating in the virtual visit: patient, Marine scientist.    I discussed the limitations, risks, security and privacy concerns of performing an evaluation and management service by telephone and the availability of in person appointments. The patient expressed understanding and verbally consented to this telephonic visit.    Interactive audio and video telecommunications were attempted between this provider and patient, however failed, due to patient having technical difficulties OR patient did not have access to video capability.  We continued and completed visit with audio only.  Some vital signs may be absent or patient reported.   Hearing/Vision screen  Hearing Screening   125Hz  250Hz  500Hz  1000Hz  2000Hz  3000Hz  4000Hz  6000Hz  8000Hz   Right ear:           Left ear:           Comments: Patient is able to  hear conversational tones without difficulty.  No issues reported.  Vision Screening Comments: Virtual visit Annual visits Does not wear  glasses  Dietary issues and exercise activities discussed: Current Exercise Habits: The patient does not participate in regular exercise at present  Healthy diet Good water intake  Goals      Patient Stated   .  Increase physical activity (pt-stated)      I would like to walk more for exercise when I can      Depression Screen PHQ 2/9 Scores 07/09/2020 08/04/2019 05/03/2019 01/17/2019 11/24/2018  PHQ - 2 Score 0 0 0 0 0    Fall Risk Fall Risk  07/09/2020 04/02/2020 02/06/2020 08/04/2019 05/03/2019  Falls in the past year? 0 0 0 0 0  Number falls in past yr: 0 0 0 0 -  Injury with Fall? 0 0 0 0 0  Follow up Falls evaluation completed Falls evaluation completed Falls evaluation completed Falls evaluation completed -    FALL RISK PREVENTION PERTAINING TO THE HOME: Handrails in use when climbing stairs? Yes Home free of loose throw rugs in walkways, pet beds, electrical cords, etc? No  Adequate lighting in your home to reduce risk of falls? No   ASSISTIVE DEVICES UTILIZED TO PREVENT FALLS: Use of a cane, walker or w/c? Yes   TIMED UP AND GO: Was the test performed? No . Virtual visit.   Cognitive Function:     6CIT Screen 07/09/2020  What Year? 0 points  What month? 0 points  What time? 0 points  Count back from 20 0 points  Months in reverse 0 points  Repeat phrase 0 points  Total Score 0    Immunizations Immunization History  Administered Date(s) Administered  . Fluad Quad(high Dose 65+) 01/04/2019, 01/29/2020  . Influenza, High Dose Seasonal PF 01/13/2018  . PFIZER(Purple Top)SARS-COV-2 Vaccination 06/18/2019, 07/12/2019, 03/29/2020    TDAP status: Due, Education has been provided regarding the importance of this vaccine. Advised may receive this vaccine at local pharmacy or Health Dept. Aware to provide a copy of the vaccination record if obtained from local pharmacy or Health Dept. Verbalized acceptance and understanding. Deferred.   Health Maintenance Health  Maintenance  Topic Date Due  . TETANUS/TDAP  Never done  . PNA vac Low Risk Adult (1 of 2 - PCV13) Never done  . COLONOSCOPY (Pts 45-84yr Insurance coverage will need to be confirmed)  05/29/2020  . COVID-19 Vaccine (4 - Booster for Pfizer series) 09/27/2020  . INFLUENZA VACCINE  Completed  . Hepatitis C Screening  Completed  . HPV VACCINES  Aged Out   Colonoscopy/Cologuard- deferred until Pulmonology/Cardiology appointment in June.   DG Chest 2 view: completed 05/17/20  Vision Screening: Recommended annual ophthalmology exams for early detection of glaucoma and other disorders of the eye. Is the patient up to date with their annual eye exam?  Yes   Dental Screening: Recommended annual dental exams for proper oral hygiene.  Community Resource Referral / Chronic Care Management: CRR required this visit?  No   CCM required this visit?  No      Plan:   Keep all routine maintenance appointments.   Follow up 08/06/20 @ 9:00  Agrees to bring updated immunization vaccines from local pharmacy next office visit.   I have personally reviewed and noted the following in the patient's chart:   . Medical and social history . Use of alcohol, tobacco or illicit drugs  . Current medications and supplements . Functional ability and  status . Nutritional status . Physical activity . Advanced directives . List of other physicians . Hospitalizations, surgeries, and ER visits in previous 12 months . Vitals . Screenings to include cognitive, depression, and falls . Referrals and appointments  In addition, I have reviewed and discussed with patient certain preventive protocols, quality metrics, and best practice recommendations. A written personalized care plan for preventive services as well as general preventive health recommendations were provided to patient.     Varney Biles, LPN   [id number]/54/[id number]

## 2020-07-18 ENCOUNTER — Other Ambulatory Visit: Payer: Self-pay | Admitting: Cardiovascular Disease

## 2020-07-26 ENCOUNTER — Other Ambulatory Visit: Payer: Self-pay | Admitting: Gastroenterology

## 2020-07-29 ENCOUNTER — Other Ambulatory Visit: Payer: Self-pay | Admitting: Internal Medicine

## 2020-07-31 ENCOUNTER — Other Ambulatory Visit: Payer: Self-pay | Admitting: Cardiology

## 2020-07-31 NOTE — Telephone Encounter (Signed)
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 Saunders Revel

## 2020-08-06 ENCOUNTER — Ambulatory Visit (INDEPENDENT_AMBULATORY_CARE_PROVIDER_SITE_OTHER): Payer: Medicare Other | Admitting: Internal Medicine

## 2020-08-06 ENCOUNTER — Other Ambulatory Visit: Payer: Self-pay

## 2020-08-06 ENCOUNTER — Encounter: Payer: Self-pay | Admitting: Internal Medicine

## 2020-08-06 VITALS — BP 140/84 | HR 63 | Temp 98.3°F | Ht 66.5 in | Wt 176.8 lb

## 2020-08-06 DIAGNOSIS — R32 Unspecified urinary incontinence: Secondary | ICD-10-CM

## 2020-08-06 DIAGNOSIS — H43393 Other vitreous opacities, bilateral: Secondary | ICD-10-CM

## 2020-08-06 DIAGNOSIS — I10 Essential (primary) hypertension: Secondary | ICD-10-CM

## 2020-08-06 DIAGNOSIS — M25531 Pain in right wrist: Secondary | ICD-10-CM | POA: Diagnosis not present

## 2020-08-06 DIAGNOSIS — E876 Hypokalemia: Secondary | ICD-10-CM | POA: Diagnosis not present

## 2020-08-06 DIAGNOSIS — Z9889 Other specified postprocedural states: Secondary | ICD-10-CM

## 2020-08-06 LAB — COMPREHENSIVE METABOLIC PANEL
ALT: 28 U/L (ref 0–53)
AST: 28 U/L (ref 0–37)
Albumin: 3.8 g/dL (ref 3.5–5.2)
Alkaline Phosphatase: 69 U/L (ref 39–117)
BUN: 21 mg/dL (ref 6–23)
CO2: 30 mEq/L (ref 19–32)
Calcium: 9.6 mg/dL (ref 8.4–10.5)
Chloride: 106 mEq/L (ref 96–112)
Creatinine, Ser: 0.91 mg/dL (ref 0.40–1.50)
GFR: 84.57 mL/min (ref >60.00)
Glucose, Bld: 102 mg/dL — ABNORMAL HIGH (ref 70–99)
Potassium: 4.1 mEq/L (ref 3.5–5.1)
Sodium: 142 mEq/L (ref 135–145)
Total Bilirubin: 0.6 mg/dL (ref 0.2–1.2)
Total Protein: 6.8 g/dL (ref 6.0–8.3)

## 2020-08-06 LAB — MAGNESIUM: Magnesium: 1.9 mg/dL (ref 1.5–2.5)

## 2020-08-06 MED ORDER — TELMISARTAN-HCTZ 80-12.5 MG PO TABS: 1.0000 | ORAL_TABLET | Freq: Every day | ORAL | 3 refills | Status: DC

## 2020-08-06 MED ORDER — METOPROLOL SUCCINATE ER 100 MG PO TB24: 100.0000 mg | ORAL_TABLET | Freq: Every day | ORAL | 3 refills | Status: DC

## 2020-08-06 NOTE — Progress Notes (Signed)
Chief Complaint  Patient presents with  . Follow-up   F/u 1. C/o right wrist pain daily and swelling pain with ROM all directions except for flexion mild to moderate nothing tried no trauma [facility] will call in future 2. htn elevated sbp 140s-150s on micardis 40-12.5 mg qd and toprol xl 100 mg qd  3. H/o prostate cancer c/o urinary incontinence f/u Dr. Larinda Buttery in 1-2 weeks to discuss  4. hypoK f/u labs   Review of Systems  Constitutional: Negative for weight loss.  HENT: Negative for hearing loss.   Eyes: Negative for blurred vision.       +floaters in eyes   Respiratory: Negative for shortness of breath.   Cardiovascular: Negative for chest pain.  Gastrointestinal: Negative for abdominal pain.  Musculoskeletal: Positive for joint pain.  Skin: Negative for rash.  Neurological: Negative for headaches.       H/a with elevated BP  Psychiatric/Behavioral: Negative for depression.   Past Medical History:  Diagnosis Date  . Anemia   . Arthritis   . BPH (benign prostatic hyperplasia)   . CAD (coronary artery disease)    x 7 cardiac stents  . Chronic back pain    L4/5 with chronic right leg pain   . Floater, vitreous, left   . Hx of hepatitis C    treated with Harvoni in 2014  . Hyperlipidemia   . Hypertension   . Neuromuscular disorder ([place])    nerve damage in back  . Neuropathy   . Prostate cancer ([place])   . PVD (peripheral vascular disease) ([place])    2 stents right leg and 1 in left   . Ulcerative colitis ([place])    with diarrhea   Past Surgical History:  Procedure Laterality Date  . back surgery     x 2 lumbar last 09/2016 in [place]  . CARDIAC CATHETERIZATION    . CATARACT EXTRACTION, BILATERAL    . COLONOSCOPY     last 09/2018 in [place]  . CORONARY BALLOON ANGIOPLASTY N/A 03/21/2020   Procedure: CORONARY BALLOON ANGIOPLASTY;  Surgeon: Nelva Bush, MD;  Location: [facility];  Service: Cardiovascular;  Laterality: N/A;  .  HERNIA REPAIR     right   . INTRAVASCULAR PRESSURE WIRE/FFR STUDY N/A 03/21/2020   Procedure: INTRAVASCULAR PRESSURE WIRE/FFR STUDY;  Surgeon: Nelva Bush, MD;  Location: [facility];  Service: Cardiovascular;  Laterality: N/A;  . LEFT HEART CATH AND CORONARY ANGIOGRAPHY N/A 03/21/2020   Procedure: LEFT HEART CATH AND CORONARY ANGIOGRAPHY;  Surgeon: Nelva Bush, MD;  Location: [facility];  Service: Cardiovascular;  Laterality: N/A;  . PENILE PROSTHESIS IMPLANT    . PERCUTANEOUS CORONARY STENT INTERVENTION (PCI-S)     x 7-8 heart  . PERIPHERAL ARTERIAL STENT GRAFT    . PROSTATE BIOPSY    . pvd     with stenting x 2 right leg below knee and 1 left thigh   . SHOULDER ARTHROSCOPY Bilateral   . VITRECTOMY     right eye   Family History  Problem Relation Age of Onset  . Heart disease Mother   . Stroke Mother   . Breast cancer Mother   . Diabetes Sister   . Diabetes Brother   . Colon cancer Neg Hx   . Esophageal cancer Neg Hx   . Inflammatory bowel disease Neg Hx   . Liver disease Neg Hx   . Pancreatic cancer Neg Hx   . Rectal  cancer Neg Hx   . Stomach cancer Neg Hx    Social History   Socioeconomic History  . Marital status: Married    Spouse name: Not on file  . Number of children: 2  . Years of education: Not on file  . Highest education level: Not on file  Occupational History  . Not on file  Tobacco Use  . Smoking status: Former Smoker    Packs/day: 1.00    Years: 40.00    Pack years: 40.00    Types: Cigarettes    Quit date: 04/20/1998    Years since quitting: 22.3  . Smokeless tobacco: Never Used  . Tobacco comment: quit in 2000s   Vaping Use  . Vaping Use: Never used  Substance and Sexual Activity  . Alcohol use: Yes    Comment: occasional  . Drug use: Not Currently  . Sexual activity: Yes  Other Topics Concern  . Not on file  Social History Narrative   Moved from [place] in 10/2018   2 sons in 54s as of 11/24/2018    Married  wife is DPR Pamala Hurry x 45 years as of 04/2019   Social Determinants of Health   Financial Resource Strain: Low Risk   . Difficulty of Paying Living Expenses: Not hard at all  Food Insecurity: No Food Insecurity  . Worried About Charity fundraiser in the Last Year: Never true  . Ran Out of Food in the Last Year: Never true  Transportation Needs: No Transportation Needs  . Lack of Transportation (Medical): No  . Lack of Transportation (Non-Medical): No  Physical Activity: Not on file  Stress: No Stress Concern Present  . Feeling of Stress : Not at all  Social Connections: Unknown  . Frequency of Communication with Friends and Family: Not on file  . Frequency of Social Gatherings with Friends and Family: Not on file  . Attends Religious Services: Not on file  . Active Member of Clubs or Organizations: Not on file  . Attends Archivist Meetings: Not on file  . Marital Status: Married  Human resources officer Violence: Not At Risk  . Fear of Current or Ex-Partner: No  . Emotionally Abused: No  . Physically Abused: No  . Sexually Abused: No   Current Meds  Medication Sig  . aspirin 81 MG chewable tablet Chew 1 tablet (81 mg total) by mouth daily.  Marland Kitchen atorvastatin (LIPITOR) 40 MG tablet TAKE 1 TABLET BY MOUTH EVERY DAY  . clopidogrel (PLAVIX) 75 MG tablet Take 1 tablet (75 mg total) by mouth daily.  . cyclobenzaprine (FLEXERIL) 5 MG tablet Take 5 mg by mouth 2 (two) times daily.  . famotidine (PEPCID) 40 MG tablet TAKE 1 TABLET BY MOUTH TWICE A DAY  . hyoscyamine (LEVBID) 0.375 MG 12 hr tablet TAKE 1 TABLET (0.375 MG TOTAL) BY MOUTH 2 (TWO) TIMES DAILY.  . isosorbide mononitrate (IMDUR) 60 MG 24 hr tablet Take 1 tablet (60 mg total) by mouth daily.  Marland Kitchen lidocaine (LIDODERM) 5 % Place 1 patch onto the skin daily as needed (pain). Remove patch after 12 hours.  . meloxicam (MOBIC) 7.5 MG tablet Take 7.5 mg by mouth daily.  . mesalamine (LIALDA) 1.2 g EC tablet TAKE 2 TABLETS BY MOUTH  DAILY WITH BREAKFAST.  . pantoprazole (PROTONIX) 40 MG tablet TAKE 1 TABLET BY MOUTH EVERY DAY  . potassium chloride 20 MEQ TBCR Take 20 mEq by mouth daily for 7 days.  . RESTASIS 0.05 %  ophthalmic emulsion Place 1 drop into both eyes 2 (two) times daily.   . tamsulosin (FLOMAX) 0.4 MG CAPS capsule Take 0.4 mg by mouth daily.  Marland Kitchen telmisartan-hydrochlorothiazide (MICARDIS HCT) 80-12.5 MG tablet Take 1 tablet by mouth daily. In am d/c 40-12.5 mg dose  . [DISCONTINUED] metoprolol succinate (TOPROL-XL) 100 MG 24 hr tablet Take 1 tablet (100 mg total) by mouth daily. Take with or immediately following a meal.  . [DISCONTINUED] telmisartan-hydrochlorothiazide (MICARDIS HCT) 40-12.5 MG tablet Take 1 tablet by mouth daily.   Allergies  Allergen Reactions  . Cymbalta [Duloxetine Hcl] Other (See Comments)    drowsiness   Recent Results (from the past 2160 hour(s))  ECHOCARDIOGRAM COMPLETE     Status: None   Collection Time: 05/14/20  3:59 PM  Result Value Ref Range   AR max vel 2.66 cm2   AV Peak grad 7.6 mmHg   Ao pk vel 1.38 m/s   S' Lateral 2.80 cm   Area-P 1/2 2.91 cm2   AV Area VTI 2.68 cm2   AV Mean grad 4.0 mmHg   Single Plane A4C EF 53.4 %   Single Plane A2C EF 53.0 %   Calc EF 53.3 %   AV Area mean vel 2.68 cm2  CRP High sensitivity     Status: None   Collection Time: 05/28/20  4:16 PM  Result Value Ref Range   CRP, High Sensitivity 0.650 0.000 - 5.000 mg/L    Comment: Note:  An elevated hs-CRP (>5 mg/L) should be repeated after 2 weeks to rule out recent infection or trauma.  CBC     Status: Abnormal   Collection Time: 05/28/20  4:16 PM  Result Value Ref Range   WBC 5.6 4.0 - 10.5 K/uL   RBC 3.87 (L) 4.22 - 5.81 Mil/uL   Platelets 170.0 150.0 - 400.0 K/uL   Hemoglobin 13.0 13.0 - 17.0 g/dL   HCT 38.7 (L) 39.0 - 52.0 %   MCV 99.8 78.0 - 100.0 fl   MCHC 33.7 30.0 - 36.0 g/dL   RDW 13.0 11.5 - 16.1 %  Basic Metabolic Panel (BMET)     Status: Abnormal   Collection Time:  05/28/20  4:16 PM  Result Value Ref Range   Sodium 139 135 - 145 mEq/L   Potassium 3.3 (L) 3.5 - 5.1 mEq/L   Chloride 104 96 - 112 mEq/L   CO2 30 19 - 32 mEq/L   Glucose, Bld 117 (H) 70 - 99 mg/dL   BUN 17 6 - 23 mg/dL   Creatinine, Ser 1.01 0.40 - 1.50 mg/dL   GFR 74.73 >60.00 mL/min    Comment: Calculated using the CKD-EPI Creatinine Equation (2021)   Calcium 9.7 8.4 - 10.5 mg/dL  Sedimentation rate     Status: None   Collection Time: 05/28/20  4:16 PM  Result Value Ref Range   Sed Rate 6 0 - 20 mm/hr  SARS CORONAVIRUS 2 (TAT 6-24 HRS) Nasopharyngeal Nasopharyngeal Swab     Status: None   Collection Time: 06/05/20 12:22 PM   Specimen: Nasopharyngeal Swab  Result Value Ref Range   SARS Coronavirus 2 NEGATIVE NEGATIVE    Comment: (NOTE) SARS-CoV-2 target nucleic acids are NOT DETECTED.  The SARS-CoV-2 RNA is generally detectable in upper and lower respiratory specimens during the acute phase of infection. Negative results do not preclude SARS-CoV-2 infection, do not rule out co-infections with other pathogens, and should not be used as the sole basis for treatment or other patient  management decisions. Negative results must be combined with clinical observations, patient history, and epidemiological information. The expected result is Negative.  Fact Sheet for Patients: SugarRoll.be  Fact Sheet for Healthcare Providers: https://www.woods-mathews.com/  This test is not yet approved or cleared by the [place] FDA and  has been authorized for detection and/or diagnosis of SARS-CoV-2 by FDA under an Emergency Use Authorization (EUA). This EUA will remain  in effect (meaning this test can be used) for the duration of the COVID-19 declaration under Se ction 564(b)(1) of the Act, 21 U.S.C. section 360bbb-3(b)(1), unless the authorization is terminated or revoked sooner.  Performed at [facility], [place] [address],  [place], [place] [address]   Pulmonary Function Test [facility] Only     Status: None   Collection Time: 06/06/20  1:19 PM  Result Value Ref Range   FVC-%Pred-Pre 85 %   FVC-Post 3.06 L   FVC-%Pred-Post 92 %   FVC-%Change-Post 8 %   FEV1-Pre 2.10 L   FEV1-%Pred-Pre 85 %   FEV1-Post 2.43 L   FEV1-%Pred-Post 98 %   FEV1-%Change-Post 15 %   FEV6-Pre 2.83 L   FEV6-%Pred-Pre 90 %   FEV6-Post 3.06 L   FEV6-%Pred-Post 97 %   FEV6-%Change-Post 7 %   Pre FEV1/FVC ratio 74 %   FEV1FVC-%Pred-Pre 97 %   Post FEV1/FVC ratio 79 %   FEV1FVC-%Change-Post 7 %   Pre FEV6/FVC Ratio 100 %   FEV6FVC-%Pred-Pre 105 %   Post FEV6/FVC ratio 100 %   FEV6FVC-%Pred-Post 105 %   FEV6FVC-%Change-Post 0 %   FEF 25-75 Pre 1.46 L/sec   FEF2575-%Pred-Pre 68 %   FEF 25-75 Post 2.86 L/sec   FEF2575-%Pred-Post 134 %   FEF2575-%Change-Post 96 %   RV 2.01 L   RV % pred 88 %   TLC 5.24 L   TLC % pred 82 %   DLCO unc 15.28 ml/min/mmHg   DLCO unc % pred 66 %   DL/VA 2.78 ml/min/mmHg/L   DL/VA % pred 67 %   FVC-Pre 2.83 L   Objective  Body mass index is 28.11 kg/m. Wt Readings from Last 3 Encounters:  08/06/20 176 lb 12.8 oz (80.2 kg)  07/09/20 174 lb (78.9 kg)  05/28/20 174 lb 2 oz (79 kg)   Temp Readings from Last 3 Encounters:  08/06/20 98.3 F (36.8 C) (Oral)  04/02/20 97.9 F (36.6 C) (Oral)  03/21/20 97.9 F (36.6 C) (Oral)   BP Readings from Last 3 Encounters:  08/06/20 140/84  05/28/20 122/66  05/17/20 140/78   Pulse Readings from Last 3 Encounters:  08/06/20 63  05/28/20 76  05/17/20 (!) 57    Physical Exam Vitals and nursing note reviewed.  Constitutional:      Appearance: Normal appearance. He is well-developed and well-groomed.  HENT:     Head: Normocephalic and atraumatic.  Cardiovascular:     Rate and Rhythm: Normal rate and regular rhythm.     Heart sounds: Normal heart sounds. No murmur heard.   Pulmonary:     Effort: Pulmonary effort is normal.     Breath sounds: Normal  breath sounds.  Musculoskeletal:     Right wrist: Swelling and tenderness present.     Comments: rom pain all directions except flexion  Skin:    General: Skin is warm and dry.  Neurological:     General: No focal deficit present.     Mental Status: He is alert and oriented to person, place, and time. Mental  status is at baseline.     Gait: Gait normal.  Psychiatric:        Attention and Perception: Attention and perception normal.        Mood and Affect: Mood and affect normal.        Speech: Speech normal.        Behavior: Behavior normal. Behavior is cooperative.        Thought Content: Thought content normal.        Cognition and Memory: Cognition and memory normal.        Judgment: Judgment normal.     Assessment  Plan  Right wrist pain Declines Xray will call [facility] if needed   Hypokalemia - Plan: Comprehensive metabolic panel, Magnesium Hypertension, unspecified type - Plan: Comprehensive metabolic panel, Magnesium Essential hypertension - Plan: telmisartan-hydrochlorothiazide (MICARDIS HCT) 80-12.5 MG tablet   Urinary incontinence, unspecified type S/p prostate cancer  ROI Dr. Larinda Buttery  F/u appt in 1-2 weeks   Vitreous floaters of both eyes - Plan: Ambulatory referral to Ophthalmology [facility] Dr. Eulas Post  History of vitrectomy - Plan: Ambulatory referral to Ophthalmology   HM Get copy of records in future from Dr. Lyman Speller in [place] MD(I.e vaccines utd flu shot 10/21 covid vx 3/3consider booster  -vaccinesROI sentprevper pt had shingles vaccine, tdap, and pna shots [facility] [place] MD ROI sent today  colonoscopy/EGD repeatleb GI utd and UC in remission  Colonoscopy per pt last 09/2018 in [place] pt Had 05/2019 and EGD with IH/H, polyps negative path nodule and gastritis EGD rec repeat EUS and EGD in 3 months per GI  Colonoscopy 05/30/19 as of 08/06/20 declines further   Former smoker quit in 2000s  PCP  Dr. Lyman Speller pt agreeable will get copy of colonoscopy, labs and vaccines   Recurrent prostate cancer tx [facility]i Klippel rad on s/p rad x 2 sessions stopped 2 weeks ago from 08/04/19  Pt ok to get records Dr. Lyman Speller  Dr. Stephanie [facility] appt 06/07/19 Rx percocet 5-325 1/2 po bid prn lyrica 50 mg bid rtc in 3 weeks  He will continue to follow up with urology for ongoing PSA determinations and has an appointment scheduled with Dr.Ottelinon 02/12/20 for repeat PSA.He was seen in the office with Dr. Karsten Ro on 08/22/2019 and had a PSA drawn at that time but has not yet been notified of those results.   Results for ESTABAN, [place] (MRN [id number]) as of 02/06/2020 09:18  Ref. Range 01/19/2019 08:45  PSA Latest Ref Range: 0.10 - 4.00 ng/ml 6.93 (H)     Provider: Dr. Olivia Mackie [name]-Internal Medicine

## 2020-08-06 NOTE — Patient Instructions (Addendum)
Tylenol  Heat/ice  Consider calling [facility] for an appt right wrist pain  voltaren/diclofenac 4x per day as needed   Goal blood pressure <130/80  Urinary Incontinence  Urinary incontinence refers to a condition in which a person is unable to control where and when to pass urine. A person with this condition will urinate when he or she does not mean to (involuntarily). What are the causes? This condition may be caused by:  Medicines.  Infections.  Constipation.  Overactive bladder muscles.  Weak bladder muscles.  Weak pelvic floor muscles. These muscles provide support for the bladder, intestine, and, in women, the uterus.  Enlarged prostate in men. The prostate is a gland near the bladder. When it gets too big, it can pinch the urethra. With the urethra blocked, the bladder can weaken and lose the ability to empty properly.  Surgery.  Emotional factors, such as anxiety, stress, or post-traumatic stress disorder (PTSD).  Pelvic organ prolapse. This happens in women when organs shift out of place and into the vagina. This shift can prevent the bladder and urethra from working properly. What increases the risk? The following factors may make you more likely to develop this condition:  Older age.  Obesity and physical inactivity.  Pregnancy and childbirth.  Menopause.  Diseases that affect the nerves or spinal cord (neurological diseases).  Long-term (chronic) coughing. This can increase pressure on the bladder and pelvic floor muscles. What are the signs or symptoms? Symptoms may vary depending on the type of urinary incontinence you have. They include:  A sudden urge to urinate, but passing urine involuntarily before you can get to a bathroom (urge incontinence).  Suddenly passing urine with any activity that forces urine to pass, such as coughing, laughing, exercise, or sneezing (stress incontinence).  Needing to urinate often, but urinating only a small  amount, or constantly dribbling urine (overflow incontinence).  Urinating because you cannot get to the bathroom in time due to a physical disability, such as arthritis or injury, or communication and thinking problems, such as Alzheimer disease (functional incontinence). How is this diagnosed? This condition may be diagnosed based on:  Your medical history.  A physical exam.  Tests, such as: ? Urine tests. ? X-rays of your kidney and bladder. ? Ultrasound. ? CT scan. ? Cystoscopy. In this procedure, a health care provider inserts a tube with a light and camera (cystoscope) through the urethra and into the bladder in order to check for problems. ? Urodynamic testing. These tests assess how well the bladder, urethra, and sphincter can store and release urine. There are different types of urodynamic tests, and they vary depending on what the test is measuring. To help diagnose your condition, your health care provider may recommend that you keep a log of when you urinate and how much you urinate. How is this treated? Treatment for this condition depends on the type of incontinence that you have and its cause. Treatment may include:  Lifestyle changes, such as: ? Quitting smoking. ? Maintaining a healthy weight. ? Staying active. Try to get 150 minutes of moderate-intensity exercise every week. Ask your health care provider which activities are safe for you. ? Eating a healthy diet.  Avoid high-fat foods, like fried foods.  Avoid refined carbohydrates like white bread and white rice.  Limit how much alcohol and caffeine you drink.  Increase your fiber intake. Foods such as fresh fruits, vegetables, beans, and whole grains are healthy sources of fiber.  Pelvic floor muscle  exercises.  Bladder training, such as lengthening the amount of time between bathroom breaks, or using the bathroom at regular intervals.  Using techniques to suppress bladder urges. This can include distraction  techniques or controlled breathing exercises.  Medicines to relax the bladder muscles and prevent bladder spasms.  Medicines to help slow or prevent the growth of a man's prostate.  Botox injections. These can help relax the bladder muscles.  Using pulses of electricity to help change bladder reflexes (electrical nerve stimulation).  For women, using a medical device to prevent urine leaks. This is a small, tampon-like, disposable device that is inserted into the urethra.  Injecting collagen or carbon beads (bulking agents) into the urinary sphincter. These can help thicken tissue and close the bladder opening.  Surgery. Follow these instructions at home: Lifestyle  Limit alcohol and caffeine. These can fill your bladder quickly and irritate it.  Keep yourself clean to help prevent odors and skin damage. Ask your doctor about special skin creams and cleansers that can protect the skin from urine.  Consider wearing pads or adult diapers. Make sure to change them regularly, and always change them right after experiencing incontinence. General instructions  Take over-the-counter and prescription medicines only as told by your health care provider.  Use the bathroom about every 3-4 hours, even if you do not feel the need to urinate. Try to empty your bladder completely every time. After urinating, wait a minute. Then try to urinate again.  Make sure you are in a relaxed position while urinating.  If your incontinence is caused by nerve problems, keep a log of the medicines you take and the times you go to the bathroom.  Keep all follow-up visits as told by your health care provider. This is important. Contact a health care provider if:  You have pain that gets worse.  Your incontinence gets worse. Get help right away if:  You have a fever or chills.  You are unable to urinate.  You have redness in your groin area or down your legs. Summary  Urinary incontinence refers to a  condition in which a person is unable to control where and when to pass urine.  This condition may be caused by medicines, infection, weak bladder muscles, weak pelvic floor muscles, enlargement of the prostate (in men), or surgery.  The following factors increase your risk for developing this condition: older age, obesity, pregnancy and childbirth, menopause, neurological diseases, and chronic coughing.  There are several types of urinary incontinence. They include urge incontinence, stress incontinence, overflow incontinence, and functional incontinence.  This condition is usually treated first with lifestyle and behavioral changes, such as quitting smoking, eating a healthier diet, and doing regular pelvic floor exercises. Other treatment options include medicines, bulking agents, medical devices, electrical nerve stimulation, or surgery. This information is not intended to replace advice given to you by your health care provider. Make sure you discuss any questions you have with your health care provider. Document Revised: 04/16/2017 Document Reviewed: 07/16/2016 Elsevier Patient Education  2021 [place].   Wrist and Forearm Exercises Ask your health care provider which exercises are safe for you. Do exercises exactly as told by your health care provider and adjust them as directed. It is normal to feel mild stretching, pulling, tightness, or discomfort as you do these exercises. Stop right away if you feel sudden pain or your pain gets worse. Do not begin these exercises until told by your health care provider. Range-of-motion exercises These exercises  warm up your muscles and joints and improve the movement and flexibility of your injured wrist and forearm. These exercises also help to relieve pain, numbness, and tingling. These exercises are done using the muscles in your injured wrist and forearm. Wrist flexion 1. Bend your left / right elbow to a 90-degree angle (right angle) with  your palm facing the floor. 2. Bend your wrist so that your fingers point toward the floor (flexion). 3. Hold this position for __________ seconds. 4. Slowly return to the starting position. Repeat __________ times. Complete this exercise __________ times a day. Wrist extension 1. Bend your left / right elbow to a 90-degree angle (right angle) with your palm facing the floor. 2. Bend your wrist so that your fingers point toward the ceiling (extension). 3. Hold this position for __________ seconds. 4. Slowly return to the starting position. Repeat __________ times. Complete this exercise __________ times a day. Ulnar deviation 1. Bend your left / right elbow to a 90-degree angle (right angle), and rest your forearm on a table with your palm facing down. 2. Keeping your hand flat on the table, bend your left /right wrist toward your small finger (pinkie). This is ulnar deviation. 3. Hold this position for __________ seconds. 4. Slowly return to the starting position. Repeat __________ times. Complete this exercise __________ times a day. Radial deviation 1. Bend your left / right elbow to a 90-degree angle (right angle), and rest your forearm on a table with your palm facing down. 2. Keeping your hand flat on the table, bend your left /right wrist toward your thumb. This is radial deviation. 3. Hold this position for __________ seconds. 4. Slowly return to the starting position. Repeat __________ times. Complete this exercise __________ times a day. Forearm rotation, supination 1. Sit with your left / right elbow bent to a 90-degree angle (right angle). Position your forearm so that the thumb is facing the ceiling (neutral position). 2. Turn (rotate) your palm up toward the ceiling (supination), stopping when you feel a gentle stretch. 3. Hold this position for __________ seconds. 4. Slowly return to the starting position. Repeat __________ times. Complete this exercise __________ times a  day.   Forearm rotation, pronation 1. Sit with your left / right elbow bent to a 90-degree angle (right angle). Position your forearm so that the thumb is facing the ceiling (neutral position). 2. Rotate your palm down toward the floor (pronation), stopping when you feel a gentle stretch. 3. Hold this position for __________ seconds. 4. Slowly return to the starting position. Repeat __________ times. Complete this exercise __________ times a day.   Stretching These exercises warm up your muscles and joints and improve the movement and flexibility of your injured wrist and forearm. These exercises also help to relieve pain, numbness, and tingling. These exercises are done using your healthy wrist and forearm to help stretch the muscles in your injured wrist and forearm. Wrist flexion 1. Extend your left / right arm in front of you, and turn your palm down toward the floor. ? If told by your health care provider, bend your left / right elbow to a 90-degree angle (right angle) at your side. 2. Using your uninjured hand, gently press over the back of your left / right hand to bend your wrist and fingers toward the floor (flexion). Go as far as you can to feel a stretch without causing pain. 3. Hold this position for __________ seconds. 4. Slowly return to the starting position. Repeat  __________ times. Complete this exercise __________ times a day.   Wrist extension 1. Extend your left / right arm in front of you and turn your palm up toward the ceiling. ? If told by your health care provider, bend your left / right elbow to a 90-degree angle (right angle) at your side. 2. Using your uninjured hand, gently press over the palm of your left / right hand to bend your wrist and fingers toward the floor (extension). Go as far as you can to feel a stretch without causing pain. 3. Hold this position for __________ seconds. 4. Slowly return to the starting position. Repeat __________ times. Complete this  exercise __________ times a day.   Forearm rotation, supination 1. Sit with your left / right elbow bent to a 90-degree angle (right angle). Position your forearm so that the thumb is facing the ceiling (neutral position). 2. Rotate your palm up toward the ceiling as far as you can on your own (supination). Then, use your uninjured hand to help turn your forearm more, stopping when you feel a gentle stretch. 3. Hold this position for __________ seconds. 4. Slowly return to the starting position. Repeat __________ times. Complete this exercise __________ times a day. Forearm rotation, pronation 1. Sit with your left / right elbow bent to a 90-degree angle (right angle). Position your forearm so that the thumb is facing the ceiling (neutral position). 2. Rotate your palm down toward the floor as far as you can on your own (pronation). Then, use your uninjured hand to help turn your forearm more, stopping when you feel a gentle stretch. 3. Hold this position for __________ seconds. 4. Slowly return to the starting position. Repeat __________ times. Complete this exercise __________ times a day. Strengthening exercises These exercises build strength and endurance in your wrist and forearm. Endurance is the ability to use your muscles for a long time, even after they get tired. Wrist flexion 1. Sit with your left / right forearm supported on a table or other surface. Bend your elbow to a 90-degree angle (right angle), and rest your hand palm-up over the edge of the table. 2. Hold a __________ weight in your left / right hand. Or, hold an exercise band or tube in both hands, keeping your hands at the same level and hip distance apart. There should be a slight tension in the exercise band or tube. 3. Slowly curl your hand up toward the ceiling (flexion). 4. Hold this position for __________ seconds. 5. Slowly lower your hand back to the starting position. Repeat __________ times. Complete this exercise  __________ times a day.   Wrist extension 1. Sit with your left / right forearm supported on a table or other surface. Bend your elbow to a 90-degree angle (right angle), and rest your hand palm-down over the edge of the table. 2. Hold a __________ weight in your left / right hand. Or, hold an exercise band or tube in both hands, keeping your hands at the same level and hip distance apart. There should be a slight tension in the exercise band or tube. 3. Slowly curl your hand up toward the ceiling (extension). 4. Hold this position for __________ seconds. 5. Slowly lower your hand back to the starting position. Repeat __________ times. Complete this exercise __________ times a day.   Forearm rotation, supination 1. Sit with your left / right forearm supported on a table or other surface. Bend your elbow to a 90-degree angle (right angle). Position  your forearm so that your thumb is facing the ceiling (neutral position) and your hand is resting over the edge of the table. 2. Hold a hammer in your left / right hand. ? This exercise will be easier if you hold the hammer near the head of the hammer. ? This exercise will be harder if you hold the hammer near the end of the handle. 3. Without moving your elbow, slowly rotate your palm up toward the ceiling (supination). 4. Hold this position for __________ seconds. 5. Slowly return to the starting position. Repeat __________ times. Complete this exercise __________ times a day.   Forearm rotation, pronation 1. Sit with your left / right forearm supported on a table or other surface. Bend your elbow to a 90-degree angle (right angle). Position your forearm so that the thumb is facing the ceiling (neutral position), with your hand resting over the edge of the table. 2. Hold a hammer in your left / right hand. ? This exercise will be easier if you hold the hammer near the head of the hammer. ? This exercise will be harder if you hold the hammer near the  end of the handle. 3. Without moving your elbow, slowly rotate your palm down toward the floor (pronation). 4. Hold this position for __________ seconds. 5. Slowly return to the starting position. Repeat __________ times. Complete this exercise __________ times a day.   Grip strengthening 1. Grasp a stress ball or other ball in the middle of your left / right hand. Start with your elbow bent to a 90-degree angle (right angle). 2. Slowly increase the pressure, squeezing the ball as hard as you can without causing pain. ? Think of bringing the tips of your fingers into the middle of your palm. All of your finger joints should bend when doing this exercise. ? To make this exercise harder, gradually try to straighten your elbow in front of you, until you can do the exercise with your elbow fully straight. 3. Hold your squeeze for __________ seconds, then relax. If instructed by your health care provider, do this exercise: ? With your forearm positioned so that the thumb is facing the ceiling (neutral position). ? With your forearm turned palm down. ? With your forearm turned palm up. Repeat __________ times. Complete this exercise __________ times a day.   This information is not intended to replace advice given to you by your health care provider. Make sure you discuss any questions you have with your health care provider. Document Revised: 05/26/2018 Document Reviewed: 05/26/2018 Elsevier Patient Education  2021 [place].  Wrist Pain, Adult There are many things that can cause wrist pain. Some common causes include:  An injury to the wrist area, such as a sprain, strain, or fracture.  Overuse of the joint.  A condition that causes increased pressure on a nerve in the wrist (carpal tunnel syndrome).  Wear and tear of the joints that occurs with aging (osteoarthritis).  Other types of joint inflammation and stiffness (arthritis). Sometimes, the cause of wrist pain is not known. Often,  the pain goes away when you follow instructions from your health care provider for relieving pain at home, such as resting the wrist, icing the wrist, or using a splint or an elastic wrap for a short time. If your wrist pain continues, it is important to tell your health care provider. Follow these instructions at home: If you have a splint or elastic wrap:  Wear the splint or wrap as told  by your health care provider. Remove it only as told by your health care provider. Ask your health care provider if you may remove it for bathing.  Loosen the splint or wrap if your fingers tingle, become numb, or turn cold and blue.  Check the skin around the splint or wrap every day. Tell your health care provider about any concerns.  Keep the splint or wrap clean.  If the splint or wrap is not waterproof: ? Do not let it get wet. ? Cover it with a watertight covering when you take a bath or shower. Managing pain, stiffness, and swelling  If directed, put ice on the painful area. To do this: ? If you have a removable splint or wrap, remove it as told by your health care provider. ? Put ice in a plastic bag. ? Place a towel between your skin and the bag or between your splint or wrap and the bag. ? Leave the ice on for 20 minutes, 2-3 times a day.  Move your fingers often to reduce stiffness and swelling.  Raise (elevate) the injured area above the level of your heart while you are sitting or lying down.   Activity  Rest your affected wrist as told by your health care provider.  Return to your normal activities as told by your health care provider. Ask your health care provider what activities are safe for you.  Ask your health care provider when it is safe to drive if you have a splint or wrap on your wrist.  Do exercises as told by your health care provider. General instructions  Pay attention to any changes in your symptoms.  Take over-the-counter and prescription medicines only as told  by your health care provider.  Keep all follow-up visits as told by your health care provider. This is important. Contact a health care provider if:  You have a sudden, sharp pain in the wrist, hand, or arm that is different or new.  The swelling or bruising on your wrist or hand gets worse.  Your skin becomes red, gets a rash, or has open sores.  Your pain does not get better or it gets worse.  You have a fever or chills. Get help right away if:  You lose feeling in your fingers or hand.  Your fingers turn white, very red, or cold and blue.  You cannot move your fingers. Summary  Wrist pain in an adult has many different causes.  If your wrist pain continues, it is important to tell your health care provider.  You may need to wear a splint or an elastic wrap for a short period of time.  Return to your normal activities as told by your health care provider. Ask your health care provider what activities are safe for you. This information is not intended to replace advice given to you by your health care provider. Make sure you discuss any questions you have with your health care provider. Document Revised: 02/23/2019 Document Reviewed: 02/23/2019 Elsevier Patient Education  2021 [place] Eating Plan DASH stands for Dietary Approaches to Stop Hypertension. The DASH eating plan is a healthy eating plan that has been shown to:  Reduce high blood pressure (hypertension).  Reduce your risk for type 2 diabetes, heart disease, and stroke.  Help with weight loss. What are tips for following this plan? Reading food labels  Check food labels for the amount of salt (sodium) per serving. Choose foods with less than 5 percent of  the Daily Value of sodium. Generally, foods with less than 300 milligrams (mg) of sodium per serving fit into this eating plan.  To find whole grains, look for the word "whole" as the first word in the ingredient list. Shopping  Buy products  labeled as "low-sodium" or "no salt added."  Buy fresh foods. Avoid canned foods and pre-made or frozen meals. Cooking  Avoid adding salt when cooking. Use salt-free seasonings or herbs instead of table salt or sea salt. Check with your health care provider or pharmacist before using salt substitutes.  Do not fry foods. Cook foods using healthy methods such as baking, boiling, grilling, roasting, and broiling instead.  Cook with heart-healthy oils, such as olive, canola, avocado, soybean, or sunflower oil. Meal planning  Eat a balanced diet that includes: ? 4 or more servings of fruits and 4 or more servings of vegetables each day. Try to fill one-half of your plate with fruits and vegetables. ? 6-8 servings of whole grains each day. ? Less than 6 oz (170 g) of lean meat, poultry, or fish each day. A 3-oz (85-g) serving of meat is about the same size as a deck of cards. One egg equals 1 oz (28 g). ? 2-3 servings of low-fat dairy each day. One serving is 1 cup (237 mL). ? 1 serving of nuts, seeds, or beans 5 times each week. ? 2-3 servings of heart-healthy fats. Healthy fats called omega-3 fatty acids are found in foods such as walnuts, flaxseeds, fortified milks, and eggs. These fats are also found in cold-water fish, such as sardines, salmon, and mackerel.  Limit how much you eat of: ? Canned or prepackaged foods. ? Food that is high in trans fat, such as some fried foods. ? Food that is high in saturated fat, such as fatty meat. ? Desserts and other sweets, sugary drinks, and other foods with added sugar. ? Full-fat dairy products.  Do not salt foods before eating.  Do not eat more than 4 egg yolks a week.  Try to eat at least 2 vegetarian meals a week.  Eat more home-cooked food and less restaurant, buffet, and fast food.   Lifestyle  When eating at a restaurant, ask that your food be prepared with less salt or no salt, if possible.  If you drink alcohol: ? Limit how much  you use to:  0-1 drink a day for women who are not pregnant.  0-2 drinks a day for men. ? Be aware of how much alcohol is in your drink. In the [place] one drink equals one 12 oz bottle of beer (355 mL), one 5 oz glass of wine (148 mL), or one 1 oz glass of hard liquor (44 mL). General information  Avoid eating more than 2,300 mg of salt a day. If you have hypertension, you may need to reduce your sodium intake to 1,500 mg a day.  Work with your health care provider to maintain a healthy body weight or to lose weight. Ask what an ideal weight is for you.  Get at least 30 minutes of exercise that causes your heart to beat faster (aerobic exercise) most days of the week. Activities may include walking, swimming, or biking.  Work with your health care provider or dietitian to adjust your eating plan to your individual calorie needs. What foods should I eat? Fruits All fresh, dried, or frozen fruit. Canned fruit in natural juice (without added sugar). Vegetables Fresh or frozen vegetables (raw, steamed, roasted, or grilled).  Low-sodium or reduced-sodium tomato and vegetable juice. Low-sodium or reduced-sodium tomato sauce and tomato paste. Low-sodium or reduced-sodium canned vegetables. Grains Whole-grain or whole-wheat bread. Whole-grain or whole-wheat pasta. Brown rice. [place]. Bulgur. Whole-grain and low-sodium cereals. Pita bread. Low-fat, low-sodium crackers. Whole-wheat flour tortillas. Meats and other proteins Skinless chicken or [place]. Ground chicken or [place]. Pork with fat trimmed off. Fish and seafood. Egg whites. Dried beans, peas, or lentils. Unsalted nuts, nut butters, and seeds. Unsalted canned beans. Lean cuts of beef with fat trimmed off. Low-sodium, lean precooked or cured meat, such as sausages or meat loaves. Dairy Low-fat (1%) or fat-free (skim) milk. Reduced-fat, low-fat, or fat-free cheeses. Nonfat, low-sodium ricotta or cottage cheese. Low-fat or nonfat yogurt.  Low-fat, low-sodium cheese. Fats and oils Soft margarine without trans fats. Vegetable oil. Reduced-fat, low-fat, or light mayonnaise and salad dressings (reduced-sodium). Canola, safflower, olive, avocado, soybean, and sunflower oils. Avocado. Seasonings and condiments Herbs. Spices. Seasoning mixes without salt. Other foods Unsalted popcorn and pretzels. Fat-free sweets. The items listed above may not be a complete list of foods and beverages you can eat. Contact a dietitian for more information. What foods should I avoid? Fruits Canned fruit in a light or heavy syrup. Fried fruit. Fruit in cream or butter sauce. Vegetables Creamed or fried vegetables. Vegetables in a cheese sauce. Regular canned vegetables (not low-sodium or reduced-sodium). Regular canned tomato sauce and paste (not low-sodium or reduced-sodium). Regular tomato and vegetable juice (not low-sodium or reduced-sodium). Angie Fava. Olives. Grains Baked goods made with fat, such as croissants, muffins, or some breads. Dry pasta or rice meal packs. Meats and other proteins Fatty cuts of meat. Ribs. Fried meat. Berniece Salines. Bologna, salami, and other precooked or cured meats, such as sausages or meat loaves. Fat from the back of a pig (fatback). Bratwurst. Salted nuts and seeds. Canned beans with added salt. Canned or smoked fish. Whole eggs or egg yolks. Chicken or [place] with skin. Dairy Whole or 2% milk, cream, and half-and-half. Whole or full-fat cream cheese. Whole-fat or sweetened yogurt. Full-fat cheese. Nondairy creamers. Whipped toppings. Processed cheese and cheese spreads. Fats and oils Butter. Stick margarine. Lard. Shortening. Ghee. Bacon fat. Tropical oils, such as coconut, palm kernel, or palm oil. Seasonings and condiments Onion salt, garlic salt, seasoned salt, table salt, and sea salt. Worcestershire sauce. Tartar sauce. Barbecue sauce. Teriyaki sauce. Soy sauce, including reduced-sodium. Steak sauce. Canned and packaged  gravies. Fish sauce. Oyster sauce. Cocktail sauce. Store-bought horseradish. Ketchup. Mustard. Meat flavorings and tenderizers. Bouillon cubes. Hot sauces. Pre-made or packaged marinades. Pre-made or packaged taco seasonings. Relishes. Regular salad dressings. Other foods Salted popcorn and pretzels. The items listed above may not be a complete list of foods and beverages you should avoid. Contact a dietitian for more information. Where to find more information  National Heart, Lung, and Blood Institute: https://wilson-eaton.com/  American Heart Association: www.heart.org  Academy of Nutrition and Dietetics: www.eatright.[place]: www.kidney.org Summary  The DASH eating plan is a healthy eating plan that has been shown to reduce high blood pressure (hypertension). It may also reduce your risk for type 2 diabetes, heart disease, and stroke.  When on the DASH eating plan, aim to eat more fresh fruits and vegetables, whole grains, lean proteins, low-fat dairy, and heart-healthy fats.  With the DASH eating plan, you should limit salt (sodium) intake to 2,300 mg a day. If you have hypertension, you may need to reduce your sodium intake to 1,500 mg a day.  Work  with your health care provider or dietitian to adjust your eating plan to your individual calorie needs. This information is not intended to replace advice given to you by your health care provider. Make sure you discuss any questions you have with your health care provider. Document Revised: 03/10/2019 Document Reviewed: 03/10/2019 Elsevier Patient Education  2021 [employer].

## 2020-08-16 ENCOUNTER — Encounter: Payer: Self-pay | Admitting: Internal Medicine

## 2020-08-16 ENCOUNTER — Telehealth: Payer: Self-pay | Admitting: Gastroenterology

## 2020-08-16 ENCOUNTER — Ambulatory Visit: Payer: Medicare Other | Admitting: Internal Medicine

## 2020-08-16 VITALS — BP 170/86 | HR 67 | Ht 67.0 in | Wt 174.0 lb

## 2020-08-16 DIAGNOSIS — R0600 Dyspnea, unspecified: Secondary | ICD-10-CM

## 2020-08-16 DIAGNOSIS — I25118 Atherosclerotic heart disease of native coronary artery with other forms of angina pectoris: Secondary | ICD-10-CM | POA: Diagnosis not present

## 2020-08-16 DIAGNOSIS — I739 Peripheral vascular disease, unspecified: Secondary | ICD-10-CM

## 2020-08-16 DIAGNOSIS — K51919 Ulcerative colitis, unspecified with unspecified complications: Secondary | ICD-10-CM

## 2020-08-16 DIAGNOSIS — E785 Hyperlipidemia, unspecified: Secondary | ICD-10-CM

## 2020-08-16 DIAGNOSIS — R0609 Other forms of dyspnea: Secondary | ICD-10-CM

## 2020-08-16 DIAGNOSIS — I10 Essential (primary) hypertension: Secondary | ICD-10-CM | POA: Diagnosis not present

## 2020-08-16 MED ORDER — AMLODIPINE BESYLATE 5 MG PO TABS: 5.0000 mg | ORAL_TABLET | Freq: Every day | ORAL | 2 refills | Status: DC

## 2020-08-16 NOTE — Telephone Encounter (Signed)
FYI - Dr. Rush Landmark Thanks Janett Billow- Returned call to pt - gave recommendation of stool studies and labs based on Dr. Donneta Romberg last note as well as Jessica's plan. Pt declined. I offered an appointment for next week with Dr. Rush Landmark, pt declined as well and states that he will just wait until August when he suppose to follow-up .Pt advised that if his symptoms continue then he would need to consider above plan or make office appt to be seen. Pt voiced understanding.

## 2020-08-16 NOTE — Telephone Encounter (Signed)
Erik Nicholson,   Pt called c/o frequent urgency of bowel movements with uncontrollable loose stool. Pt is currently taking Mesalamine for UC. Pt states that in the past he has been given a short taper of Prednisone and it has helped. Dr.Mansouraty is not in the office today. Is this something that you could advise on please.

## 2020-08-16 NOTE — Progress Notes (Signed)
Follow-up Outpatient Visit Date: 08/16/2020  Primary Care Provider: [name], Erik Glow, MD [place] [id number]  Chief Complaint: Follow-up coronary artery disease  HPI:  [name] is a 70 y.o. male with history of coronary artery disease status post multiple PCI's, peripheral vascular disease status post bilateral lower extremity stentingfollowed by Dr. Scot Nicholson, ulcerative colitis, hypertension, hyperlipidemia, hepatitis C,prostate cancer,and BPH, who presents for follow-up of coronary artery disease.  I last saw him in late January for follow-up of his CAD status post PTCA to the RCA a month earlier.  He reported improvement in his chest pain though he continued to have significant dyspnea on exertion.  Prior escalation of isosorbide mononitrate did not seem to help.  We elected to obtain PFTs, which showed probable small obstructive airway disease with bronchodilator response.  He is scheduled for pulmonary consultation with Dr. Mortimer Nicholson next week.  Today, [name] is most concerned about recent abdominal pain and bowel irregularity that has been associated with colitis flares in the past.  He has already reached out to his GI provider to request prednisone, as this is the only treatment that has worked well in the past with colitis flares.  From a heart standpoint, he feels about the same as at our prior visit.  He still gets mild tightness in the chest (1/10) when briskly walking.  Chronic exertional dyspnea is also stable.  He denies palpitations, and lightheadedness.  Chronic leg pain and mild edema are also unchanged.  He notes that his telmisartan-HCTZ was recently doubled due to elevated blood pressures, though he notes that home readings continue to be elevated.  --------------------------------------------------------------------------------------------------  Cardiovascular History & Procedures: Cardiovascular Problems:  Coronary artery  disease  Peripheral vascular disease  Risk Factors:  Known CAD and PAD, hypertension, hyperlipidemia, male gender, and age greater than 49  Cath/PCI:  LHC/PTCA (03/21/2020): LMCA with minimal luminal irregularities.  LAD with 30% ostial/proximal disease and 60-70% ostial stenoses of D1 and D2.  Prox/mid LAD stent widely patent.  LCx with 40% proximal/mid stenosis.  RCA with long segment of overlapping stents (prox -> distal) with 70-80% ISR involving the mid-distal segment.  60% ostial rPDA and 70% ostial rPL1 also noted.  Successful FFR-guided angioplasty of mid/distal RCA using a 3.5 x 15 mm [place] balloon at up to 22 atm with reduction in stenosis from 75% -> 20%.  RLE PTA (02/19/2014): Right mid and distal SFA laser atherectomy and PTA/stenting.  Runoff (02/01/2014): Severe right SFA stenosis with 2-vessel runoff.  Abdominal aortogran and runoff/PTA (04/26/2012): Right SFA stenting and left SFA PTA with DCB  LHC (02/25/2012): LMCA, LAD, and LCx "without any obtructive lesions). RCA with 40% mid vessel stenosis. Proximal and mid RCA stents patent. LVEF 60%  Abdominal aortogram and runoff (02/25/2012): 95% bilateral SFA stenoses.  CV Surgery:  None  EP Procedures and Devices:  None  Non-Invasive Evaluation(s):  TTE (05/14/2020): Normal LV size with mild LVH.  LVEF 55-60% with normal wall motion.  Grade 1 diastolic dysfunction.  Normal RV size and function.  No significant valvular abnormality.  ABIs (05/17/2019):Normal ABIs and TBI's bilaterally.  Myocardial perfusion stress test (03/07/2019): Normal study without ischemia or scar. LVEF 56%.  TTE (03/02/2019): Normal LV size and wall thickness. LVEF 60-65% with grade 1 diastolic dysfunction. Normal RV size and function. No significant valvular abnormality.  ABIs and bilateral lower extremity Doppler (03/02/2019): ABIs normal (1.1 bilaterally). Bilateral SFA stents are patent with mild stenosis in the midsegment of  the  right SFA stent.  Abdominal aorta duplex (07/22/2016): No AAA. Maximal aortic diameter 2.8 cm.  TTE (07/22/2016): Normal LV size with mild LVH and grade 1 diastolic dysfunction. LVEF 65%. Trace aortic and mitral regurgitation.  Pharmacologic MPI (07/21/2016): Normal myocardial perfusion without ischemia or scar.  Renal artery duplex (02/19/2012): Normal without evidence of renal artery stenosis.   Recent CV Pertinent Labs: Lab Results  Component Value Date   CHOL 126 01/30/2020   HDL 58.80 01/30/2020   LDLCALC 45 01/30/2020   TRIG 113.0 01/30/2020   CHOLHDL 2 01/30/2020   INR 1.1 (H) 12/22/2018   K 4.1 08/06/2020   MG 1.9 08/06/2020   BUN 21 08/06/2020   BUN 20 03/13/2020   CREATININE 0.91 08/06/2020    Past medical and surgical history were reviewed and updated in EPIC.  Current Meds  Medication Sig  . aspirin 81 MG chewable tablet Chew 1 tablet (81 mg total) by mouth daily.  Marland Kitchen atorvastatin (LIPITOR) 40 MG tablet TAKE 1 TABLET BY MOUTH EVERY DAY  . clopidogrel (PLAVIX) 75 MG tablet Take 1 tablet (75 mg total) by mouth daily.  . cyclobenzaprine (FLEXERIL) 5 MG tablet Take 5 mg by mouth 2 (two) times daily.  . famotidine (PEPCID) 40 MG tablet TAKE 1 TABLET BY MOUTH TWICE A DAY  . hyoscyamine (LEVBID) 0.375 MG 12 hr tablet TAKE 1 TABLET (0.375 MG TOTAL) BY MOUTH 2 (TWO) TIMES DAILY.  . isosorbide mononitrate (IMDUR) 60 MG 24 hr tablet Take 1 tablet (60 mg total) by mouth daily.  Marland Kitchen lidocaine (LIDODERM) 5 % Place 1 patch onto the skin daily as needed (pain). Remove patch after 12 hours.  . meloxicam (MOBIC) 7.5 MG tablet Take 7.5 mg by mouth daily.  . mesalamine (LIALDA) 1.2 g EC tablet TAKE 2 TABLETS BY MOUTH DAILY WITH BREAKFAST.  . metoprolol succinate (TOPROL-XL) 100 MG 24 hr tablet Take 1 tablet (100 mg total) by mouth daily. Take with or immediately following a meal.  . nitroGLYCERIN (NITROSTAT) 0.4 MG SL tablet Place 1 tablet (0.4 mg total) under the tongue every 5 (five)  minutes as needed for chest pain. If taking 2 call 911  . oxyCODONE-acetaminophen (PERCOCET) 5-325 MG tablet Take 1 tablet by mouth 2 (two) times daily as needed for severe pain.  . pantoprazole (PROTONIX) 40 MG tablet TAKE 1 TABLET BY MOUTH EVERY DAY  . potassium chloride 20 MEQ TBCR Take 20 mEq by mouth daily for 7 days.  . RESTASIS 0.05 % ophthalmic emulsion Place 1 drop into both eyes 2 (two) times daily.   . tamsulosin (FLOMAX) 0.4 MG CAPS capsule Take 0.4 mg by mouth daily.  Marland Kitchen telmisartan-hydrochlorothiazide (MICARDIS HCT) 80-12.5 MG tablet Take 1 tablet by mouth daily. In am d/c 40-12.5 mg dose    Allergies: Cymbalta [duloxetine hcl]  Social History   Tobacco Use  . Smoking status: Former Smoker    Packs/day: 1.00    Years: 40.00    Pack years: 40.00    Types: Cigarettes    Quit date: 04/20/1998    Years since quitting: 22.3  . Smokeless tobacco: Never Used  . Tobacco comment: quit in 2000s   Vaping Use  . Vaping Use: Never used  Substance Use Topics  . Alcohol use: Yes    Comment: occasional  . Drug use: Not Currently    Family History  Problem Relation Age of Onset  . Heart disease Mother   . Stroke Mother   . Breast cancer  Mother   . Diabetes Sister   . Diabetes Brother   . Colon cancer Neg Hx   . Esophageal cancer Neg Hx   . Inflammatory bowel disease Neg Hx   . Liver disease Neg Hx   . Pancreatic cancer Neg Hx   . Rectal cancer Neg Hx   . Stomach cancer Neg Hx     Review of Systems: A 12-system review of systems was performed and was negative except as noted in the HPI.  --------------------------------------------------------------------------------------------------  Physical Exam: BP (!) 170/86 (BP Location: Left Arm, Patient Position: Sitting, Cuff Size: Normal)   Pulse 67   Ht _0  (1.702 m)   Wt 174 lb (78.9 kg)   SpO2 98%   BMI 27.25 kg/m   General:  NAD. Neck: No JVD or HJR. Lungs: Mildly diminished breath sounds throughout without  wheezes or crackles. Heart: Regular rate and rhythm without murmurs, rubs, or gallops. Abdomen: Soft, nontender, nondistended. Extremities: Trace ankle edema bilaterally   Lab Results  Component Value Date   WBC 5.6 05/28/2020   HGB 13.0 05/28/2020   HCT 38.7 (L) 05/28/2020   MCV 99.8 05/28/2020   PLT 170.0 05/28/2020    Lab Results  Component Value Date   NA 142 08/06/2020   K 4.1 08/06/2020   CL 106 08/06/2020   CO2 30 08/06/2020   BUN 21 08/06/2020   CREATININE 0.91 08/06/2020   GLUCOSE 102 (H) 08/06/2020   ALT 28 08/06/2020    Lab Results  Component Value Date   CHOL 126 01/30/2020   HDL 58.80 01/30/2020   LDLCALC 45 01/30/2020   TRIG 113.0 01/30/2020   CHOLHDL 2 01/30/2020    --------------------------------------------------------------------------------------------------  ASSESSMENT AND PLAN: Coronary artery disease with stable angina: [name] reports stable chest pain and shortness of breath with activity.  His blood pressure is quite elevated today.  We have agreed to add amlodipine 5 mg daily for antianginal therapy and blood pressure control.  We will continue his current doses of metoprolol and isosorbide mononitrate, as well as indefinite DAPT.  Dyspnea on exertion: Likely multifactorial, including CAD and obstructive lung disease noted on recent PFT's.  Pulmonary consultation with Dr. Mortimer Nicholson is scheduled for next week.  Hypertension: Blood pressure is not well-controlled today despite recent escalation of telmisartan-HCTZ.  We will add amlodipine 5 mg daily and continue with the remainder of his hypertension medications.  PAD: Stable claudication reported.  Continue aggressive secondary prevention and ongoing follow-up with vascular surgery in [place].  Hyperlipidemia: Lipids at goal on last check in 01/2020.  Continue atorvastatin 40 mg daily.  Ulcerative colitis flare: I encouraged [name] to follow-up with his GI  provider.  Follow-up: Return to clinic in 3 months.  Nelva Bush, MD 08/16/2020 10:49 AM

## 2020-08-16 NOTE — Patient Instructions (Signed)
Medication Instructions:  Your physician has recommended you make the following change in your medication:   START Amlodipine 5 mg daily. An Rx has been sent to your pharmacy.   *If you need a refill on your cardiac medications before your next appointment, please call your pharmacy*   Lab Work: None ordered If you have labs (blood work) drawn today and your tests are completely normal, you will receive your results only by: Marland Kitchen MyChart Message (if you have MyChart) OR . A paper copy in the mail If you have any lab test that is abnormal or we need to change your treatment, we will call you to review the results.   Testing/Procedures: None ordered   Follow-Up: At [facility], you and your health needs are our priority.  As part of our continuing mission to provide you with exceptional heart care, we have created designated Provider Care Teams.  These Care Teams include your primary Cardiologist (physician) and Advanced Practice Providers (APPs -  Physician Assistants and Nurse Practitioners) who all work together to provide you with the care you need, when you need it.  We recommend signing up for the patient portal called "MyChart".  Sign up information is provided on this After Visit Summary.  MyChart is used to connect with patients for Virtual Visits (Telemedicine).  Patients are able to view lab/test results, encounter notes, upcoming appointments, etc.  Non-urgent messages can be sent to your provider as well.   To learn more about what you can do with MyChart, go to [email or web address].    Your next appointment:   3 month(s)  The format for your next appointment:   In Person  Provider:   You may see Nelva Bush, MD or one of the following Advanced Practice Providers on your designated Care Team:    Murray Hodgkins, NP  Christell Faith, PA-C  Marrianne Mood, PA-C  Cadence [place], [place]  Laurann [place], NP    Other Instructions N/A

## 2020-08-16 NOTE — Telephone Encounter (Signed)
Patient called states he is having a flare up and is requesting Prednisone medication said he helps him a lot.

## 2020-08-17 ENCOUNTER — Other Ambulatory Visit: Payer: Self-pay | Admitting: Gastroenterology

## 2020-08-17 ENCOUNTER — Encounter: Payer: Self-pay | Admitting: Internal Medicine

## 2020-08-17 DIAGNOSIS — R0609 Other forms of dyspnea: Secondary | ICD-10-CM

## 2020-08-17 DIAGNOSIS — R0600 Dyspnea, unspecified: Secondary | ICD-10-CM | POA: Insufficient documentation

## 2020-08-17 HISTORY — DX: Other forms of dyspnea: R06.09

## 2020-08-20 ENCOUNTER — Ambulatory Visit: Payer: Medicare Other | Admitting: Internal Medicine

## 2020-08-20 ENCOUNTER — Other Ambulatory Visit: Payer: Self-pay

## 2020-08-20 ENCOUNTER — Encounter: Payer: Self-pay | Admitting: Internal Medicine

## 2020-08-20 VITALS — BP 146/70 | HR 71 | Temp 97.8°F | Ht 67.0 in | Wt 171.0 lb

## 2020-08-20 DIAGNOSIS — J449 Chronic obstructive pulmonary disease, unspecified: Secondary | ICD-10-CM

## 2020-08-20 MED ORDER — SPIRIVA RESPIMAT 2.5 MCG/ACT IN AERS: 2.0000 | INHALATION_SPRAY | Freq: Every day | RESPIRATORY_TRACT | 0 refills | Status: DC

## 2020-08-20 MED ORDER — SPIRIVA RESPIMAT 2.5 MCG/ACT IN AERS: 2.0000 | INHALATION_SPRAY | Freq: Every day | RESPIRATORY_TRACT | 5 refills | Status: DC

## 2020-08-20 NOTE — Patient Instructions (Signed)
START SPIRIVA 2.5 2 puffs daily

## 2020-08-20 NOTE — Progress Notes (Signed)
Name: Marcelles Clinard MRN: [id number] DOB: 09-26-1950     CONSULTATION DATE: 08/20/2020  REFERRING MD : END  CHIEF COMPLAINT: SOB  STUDIES:     04/2020 CXR independently reviewed by Me 08/20/2020  The CXR was Independently Reviewed By Me Today      05/2020  PFTS reviewed by me today- Probable Small obstructive airways disease with +BD response No evidence of Restrictive Lung disease    HISTORY OF PRESENT ILLNESS: 70 year old pleasant African-American male seen today for chronic shortness of breath chronic dyspnea on exertion This has been going on for many many years Patient is a former smoker quit 20 years ago PFTs reviewed with patient today shows probable small obstructive airways disease with positive bronchodilator response Flow volume loops shows obstructive pattern on the expiratory limb  Patient denies having any cough Denies fever Denies wheezing Denies any signs of infection  Patient with a recent diagnosis of coronary artery disease had PTCA to the RCA Echo shows grade 1 diastolic dysfunction  Chest x-ray reviewed with patient no active disease noted There may be some evidence of some flattened diaphragms  I have explained to patient that he may have underlying COPD of the small airways Pursed lip breathing technique advised for patient  No exacerbation at this time No evidence of heart failure at this time No evidence or signs of infection at this time No respiratory distress No fevers, chills, nausea, vomiting, diarrhea No evidence of lower extremity edema No evidence hemoptysis    PAST MEDICAL HISTORY :   has a past medical history of Anemia, Arthritis, BPH (benign prostatic hyperplasia), CAD (coronary artery disease), Chronic back pain, Floater, vitreous, left, hepatitis C, Hyperlipidemia, Hypertension, Neuromuscular disorder ([place]), Neuropathy, Prostate cancer ([place]), PVD (peripheral vascular disease) ([place]), and Ulcerative colitis ([place]).  has a past  surgical history that includes back surgery; Colonoscopy; Percutaneous coronary stent intervention (pci-s); pvd; Shoulder arthroscopy (Bilateral); Penile prosthesis implant; Cardiac catheterization; Peripheral arterial stent graft; Hernia repair; Prostate biopsy; Cataract extraction, bilateral; LEFT HEART CATH AND CORONARY ANGIOGRAPHY (N/A, 03/21/2020); INTRAVASCULAR PRESSURE WIRE/FFR STUDY (N/A, 03/21/2020); CORONARY BALLOON ANGIOPLASTY (N/A, 03/21/2020); and Vitrectomy. Prior to Admission medications   Medication Sig Start Date End Date Taking? Authorizing Provider  amLODipine (NORVASC) 5 MG tablet Take 1 tablet (5 mg total) by mouth daily. 08/16/20 11/14/20  End, Harrell Gave, MD  aspirin 81 MG chewable tablet Chew 1 tablet (81 mg total) by mouth daily. 03/22/20   End, Harrell Gave, MD  atorvastatin (LIPITOR) 40 MG tablet TAKE 1 TABLET BY MOUTH EVERY DAY 07/31/20   End, Harrell Gave, MD  clopidogrel (PLAVIX) 75 MG tablet Take 1 tablet (75 mg total) by mouth daily. 07/29/20   End, Harrell Gave, MD  cyclobenzaprine (FLEXERIL) 5 MG tablet Take 5 mg by mouth 2 (two) times daily. 03/19/20   [provider]  famotidine (PEPCID) 40 MG tablet TAKE 1 TABLET BY MOUTH TWICE A DAY 07/26/20   Mansouraty, Telford Nab., MD  hyoscyamine (LEVBID) 0.375 MG 12 hr tablet TAKE 1 TABLET (0.375 MG TOTAL) BY MOUTH 2 (TWO) TIMES DAILY. 08/19/20   Mansouraty, Telford Nab., MD  isosorbide mononitrate (IMDUR) 60 MG 24 hr tablet Take 1 tablet (60 mg total) by mouth daily. 04/05/20   Loel Dubonnet, NP  lidocaine (LIDODERM) 5 % Place 1 patch onto the skin daily as needed (pain). Remove patch after 12 hours. 06/17/18   [provider]  meloxicam (MOBIC) 7.5 MG tablet Take 7.5 mg by mouth daily. 04/26/20   [provider]  mesalamine (LIALDA) 1.2 g EC tablet TAKE 2 TABLETS BY MOUTH DAILY WITH BREAKFAST. 05/28/20   Mansouraty, Telford Nab., MD  metoprolol succinate (TOPROL-XL) 100 MG 24 hr tablet Take 1 tablet (100 mg total)  by mouth daily. Take with or immediately following a meal. 08/06/20 11/04/20  [name], [name], MD  nitroGLYCERIN (NITROSTAT) 0.4 MG SL tablet Place 1 tablet (0.4 mg total) under the tongue every 5 (five) minutes as needed for chest pain. If taking 2 call 911 08/04/19 03/15/20  [name], [name], MD  oxyCODONE-acetaminophen (PERCOCET) 5-325 MG tablet Take 1 tablet by mouth 2 (two) times daily as needed for severe pain. 04/02/20   [name], [name], MD  pantoprazole (PROTONIX) 40 MG tablet TAKE 1 TABLET BY MOUTH EVERY DAY 04/15/20   Loel Dubonnet, NP  potassium chloride 20 MEQ TBCR Take 20 mEq by mouth daily for 7 days. 06/04/20 06/11/20  Mansouraty, Telford Nab., MD  RESTASIS 0.05 % ophthalmic emulsion Place 1 drop into both eyes 2 (two) times daily.  09/14/18   [provider]  tamsulosin (FLOMAX) 0.4 MG CAPS capsule Take 0.4 mg by mouth daily. 08/22/19   [provider]  telmisartan-hydrochlorothiazide (MICARDIS HCT) 80-12.5 MG tablet Take 1 tablet by mouth daily. In am d/c 40-12.5 mg dose 08/06/20   [name], [name], MD   Allergies  Allergen Reactions  . Cymbalta [Duloxetine Hcl] Other (See Comments)    drowsiness    FAMILY HISTORY:  family history includes Breast cancer in his mother; Diabetes in his brother and sister; Heart disease in his mother; Stroke in his mother. SOCIAL HISTORY:  reports that he quit smoking about 22 years ago. His smoking use included cigarettes. He has a 40.00 pack-year smoking history. He has never used smokeless tobacco. He reports current alcohol use. He reports previous drug use.    Review of Systems:  Gen:  Denies  fever, sweats, chills weigh loss  HEENT: Denies blurred vision, double vision, ear pain, eye pain, hearing loss, nose bleeds, sore throat Cardiac:  No dizziness, chest pain or heaviness, chest tightness,edema, No JVD Resp:   No cough, -sputum production, -shortness of breath,-wheezing, -hemoptysis,   Gi: Denies swallowing difficulty, stomach pain, nausea or vomiting, diarrhea, constipation, bowel incontinence Gu:  Denies bladder incontinence, burning urine Ext:   Denies Joint pain, stiffness or swelling Skin: Denies  skin rash, easy bruising or bleeding or hives Endoc:  Denies polyuria, polydipsia , polyphagia or weight change Psych:   Denies depression, insomnia or hallucinations  Other:  All other systems negative    BP (!) 146/70 (BP Location: Left Arm, Cuff Size: Normal)   Pulse 71   Temp 97.8 F (36.6 C) (Temporal)   Ht 5' 7"  (1.702 m)   Wt 171 lb (77.6 kg)   SpO2 97%   BMI 26.78 kg/m     Physical Examination:   GENERAL:NAD, no fevers, chills, no weakness no fatigue HEAD: Normocephalic, atraumatic.  EYES: PERLA, EOMI No scleral icterus.  NECK: Supple.  PULMONARY: CTA B/L no wheezing, rhonchi, crackles CARDIOVASCULAR: S1 and S2. Regular rate and rhythm. No murmurs GASTROINTESTINAL: Soft, nontender, nondistended. Positive bowel sounds.  MUSCULOSKELETAL: No swelling, clubbing, or edema.  NEUROLOGIC: No gross focal neurological deficits. 5/5 strength all extremities SKIN: No ulceration, lesions, rashes, or cyanosis.  PSYCHIATRIC: Insight, judgment intact. -depression -anxiety ALL OTHER ROS ARE NEGATIVE   MEDICATIONS: I have reviewed all medications and confirmed regimen as documented      ASSESSMENT AND PLAN  SYNOPSIS 70 year old pleasant African-American male seen today for evaluation for shortness of breath and dyspnea exertion most likely related to tobacco abuse causing chronic obstructive pulmonary disease COPD most likely affecting the small airways has positive bronchodilator response on PFTs  I think we should start with bronchodilator therapy with Spiriva and then use albuterol as needed once hyperinflation and air trapping are resolved  Will follow up in 3 months for assessment  CARDIOLOGY CAD- PTCA to RCA.  Interval echo showed grade 1 diastolic  dysfunction with preserved LVEF and no significant valvular abnormality     COVID-19 EDUCATION: The signs and symptoms of COVID-19 were discussed with the patient. Importance of hand Hygiene and Social Distancing   MEDICATION ADJUSTMENTS/LABS AND TESTS ORDERED: Start Spiriva Respimat 2.5 Plan on starting albuterol inhaler therapy after trial of Respimat 2.5   CURRENT MEDICATIONS REVIEWED AT LENGTH WITH PATIENT TODAY   Patient satisfied with Plan of action and management. All questions answered  Follow up 3 months  Total time spent 46 minutes  Corrin Parker, M.D.  [facility]  Medical Director [place] Director [facility] Cardio-Pulmonary Department

## 2020-08-21 NOTE — Telephone Encounter (Signed)
JZ, thank you for your thoughts on this patient while I was away. Rovonda, we will await the patient's follow-up at length.  Continues to have issues for which she will call us back as he has directed. Thanks. GM

## 2020-08-30 ENCOUNTER — Other Ambulatory Visit: Payer: Self-pay | Admitting: Internal Medicine

## 2020-09-07 ENCOUNTER — Other Ambulatory Visit: Payer: Self-pay | Admitting: Gastroenterology

## 2020-09-12 ENCOUNTER — Other Ambulatory Visit: Payer: Self-pay | Admitting: Gastroenterology

## 2020-09-15 ENCOUNTER — Other Ambulatory Visit: Payer: Self-pay | Admitting: Internal Medicine

## 2020-09-15 DIAGNOSIS — M5136 Other intervertebral disc degeneration, lumbar region: Secondary | ICD-10-CM

## 2020-09-15 DIAGNOSIS — G894 Chronic pain syndrome: Secondary | ICD-10-CM

## 2020-10-01 ENCOUNTER — Telehealth: Payer: Self-pay | Admitting: Internal Medicine

## 2020-10-01 NOTE — Telephone Encounter (Signed)
Access Nurse Documentation. Pt was instructed to go to the ED. Patient has not went to the ED as of this message.

## 2020-10-01 NOTE — Telephone Encounter (Signed)
Transferred to acces Nurse  Pt called his BP has been running around 84/55 and is dizzy   Access Nurse aware no appts available in the next 24 hours

## 2020-10-01 NOTE — Telephone Encounter (Signed)
Rec pt call 911 and go to the ED  Hydrate with fluids  If BP low hold combo pill with HCTZ hydrate with water 55-64 ounces for now  Continue other BP meds I.e norvasc and metoprolol  monitor BP 3x per day    Rec also he f/u with cardiology Dr. Saunders Revel for fluctuating BP highs in office 07/2020 and lows now  -call to schedule   If cards not available sch appt in our office asap but with sxs rec ED [facility] [place] or [facility] or [facility]

## 2020-10-01 NOTE — Telephone Encounter (Signed)
For your information  

## 2020-10-02 ENCOUNTER — Emergency Department (HOSPITAL_COMMUNITY)
Admission: EM | Admit: 2020-10-02 | Discharge: 2020-10-02 | Disposition: A | Discharge status: Home / Self Care | Payer: Medicare Other | Attending: Emergency Medicine | Admitting: Emergency Medicine

## 2020-10-02 ENCOUNTER — Other Ambulatory Visit: Payer: Self-pay | Admitting: Gastroenterology

## 2020-10-02 ENCOUNTER — Emergency Department (HOSPITAL_COMMUNITY): Payer: Medicare Other

## 2020-10-02 ENCOUNTER — Other Ambulatory Visit: Payer: Self-pay

## 2020-10-02 ENCOUNTER — Encounter (HOSPITAL_COMMUNITY): Payer: Self-pay

## 2020-10-02 DIAGNOSIS — I25118 Atherosclerotic heart disease of native coronary artery with other forms of angina pectoris: Secondary | ICD-10-CM | POA: Insufficient documentation

## 2020-10-02 DIAGNOSIS — Z7982 Long term (current) use of aspirin: Secondary | ICD-10-CM | POA: Diagnosis not present

## 2020-10-02 DIAGNOSIS — Z8546 Personal history of malignant neoplasm of prostate: Secondary | ICD-10-CM | POA: Diagnosis not present

## 2020-10-02 DIAGNOSIS — R0602 Shortness of breath: Secondary | ICD-10-CM | POA: Diagnosis not present

## 2020-10-02 DIAGNOSIS — Z79899 Other long term (current) drug therapy: Secondary | ICD-10-CM | POA: Diagnosis not present

## 2020-10-02 DIAGNOSIS — Z7902 Long term (current) use of antithrombotics/antiplatelets: Secondary | ICD-10-CM | POA: Insufficient documentation

## 2020-10-02 DIAGNOSIS — Z87891 Personal history of nicotine dependence: Secondary | ICD-10-CM | POA: Insufficient documentation

## 2020-10-02 DIAGNOSIS — I10 Essential (primary) hypertension: Secondary | ICD-10-CM | POA: Diagnosis not present

## 2020-10-02 DIAGNOSIS — I959 Hypotension, unspecified: Secondary | ICD-10-CM | POA: Diagnosis not present

## 2020-10-02 DIAGNOSIS — R42 Dizziness and giddiness: Secondary | ICD-10-CM

## 2020-10-02 LAB — CBC WITH DIFFERENTIAL/PLATELET
Abs Immature Granulocytes: 0.02 10*3/uL (ref 0.00–0.07)
Basophils Absolute: 0 10*3/uL (ref 0.0–0.1)
Basophils Relative: 1 %
Eosinophils Absolute: 0.2 10*3/uL (ref 0.0–0.5)
Eosinophils Relative: 3 %
HCT: 37.3 % — ABNORMAL LOW (ref 39.0–52.0)
Hemoglobin: 12.2 g/dL — ABNORMAL LOW (ref 13.0–17.0)
Immature Granulocytes: 0 %
Lymphocytes Relative: 25 %
Lymphs Abs: 1.5 10*3/uL (ref 0.7–4.0)
MCH: 33.2 pg (ref 26.0–34.0)
MCHC: 32.7 g/dL (ref 30.0–36.0)
MCV: 101.6 fL — ABNORMAL HIGH (ref 80.0–100.0)
Monocytes Absolute: 0.7 10*3/uL (ref 0.1–1.0)
Monocytes Relative: 11 %
Neutro Abs: 3.6 10*3/uL (ref 1.7–7.7)
Neutrophils Relative %: 60 %
Platelets: 206 10*3/uL (ref 150–400)
RBC: 3.67 MIL/uL — ABNORMAL LOW (ref 4.22–5.81)
RDW: 12.9 % (ref 11.5–15.5)
WBC: 6 10*3/uL (ref 4.0–10.5)
nRBC: 0 % (ref 0.0–0.2)

## 2020-10-02 LAB — BASIC METABOLIC PANEL
Anion gap: 7 (ref 5–15)
BUN: 22 mg/dL (ref 8–23)
CO2: 26 mmol/L (ref 22–32)
Calcium: 9.4 mg/dL (ref 8.9–10.3)
Chloride: 104 mmol/L (ref 98–111)
Creatinine, Ser: 1.09 mg/dL (ref 0.61–1.24)
GFR, Estimated: >60 mL/min (ref >60)
Glucose, Bld: 133 mg/dL — ABNORMAL HIGH (ref 70–99)
Potassium: 4.8 mmol/L (ref 3.5–5.1)
Sodium: 137 mmol/L (ref 135–145)

## 2020-10-02 LAB — URINALYSIS, ROUTINE W REFLEX MICROSCOPIC
Bilirubin Urine: NEGATIVE
Glucose, UA: NEGATIVE mg/dL
Hgb urine dipstick: NEGATIVE
Ketones, ur: NEGATIVE mg/dL
Leukocytes,Ua: NEGATIVE
Nitrite: NEGATIVE
Protein, ur: NEGATIVE mg/dL
Specific Gravity, Urine: 1.023 (ref 1.005–1.030)
pH: 5 (ref 5.0–8.0)

## 2020-10-02 LAB — TROPONIN I (HIGH SENSITIVITY)
Troponin I (High Sensitivity): 4 ng/L (ref <18)
Troponin I (High Sensitivity): 3 ng/L (ref <18)

## 2020-10-02 LAB — BRAIN NATRIURETIC PEPTIDE: B Natriuretic Peptide: 11.9 pg/mL (ref 0.0–100.0)

## 2020-10-02 MED ORDER — MECLIZINE HCL 25 MG PO TABS
25.0000 mg | ORAL_TABLET | Freq: Once | ORAL | Status: AC
  Administered 2020-10-02: 25 mg via ORAL
  Filled 2020-10-02: qty 1

## 2020-10-02 NOTE — ED Notes (Signed)
Pt provided water to aid in urine sample collection. No further needs expressed, will continue to monitor.

## 2020-10-02 NOTE — ED Notes (Signed)
Pt. Aware urine sample needed. Urinal at bedside.

## 2020-10-02 NOTE — ED Triage Notes (Signed)
Pt presents from home, reports 3 days of low BP readings at home as low as 72 SBP. C/o dizziness upon standing as well. Pt states he was started on a new BP med approx 2 wks ago. Denies pain

## 2020-10-02 NOTE — ED Provider Notes (Signed)
Emergency Medicine Provider Triage Evaluation Note  Erik Nicholson , a 70 y.o. male  was evaluated in triage.  Pt complains of light headedness mostly upon standing for last 3 days. Has been checking his BP and ranging from 00-349 systolic.  Cardiac history. Recently prescribed amlodipine 10 mg daily 2 weeks ago. Shortness of breath for long time.  Review of Systems  Positive: lightheaded Negative: CP  Physical Exam  BP 116/89   Pulse 70   Temp 98.2 F (36.8 C) (Oral)   Resp 16   Ht 5' 7"  (1.702 m)   Wt 77.1 kg   SpO2 98%   BMI 26.63 kg/m  Gen:   Awake, no distress   Resp:  Normal effort  MSK:   Moves extremities without difficulty   Medical Decision Making  Medically screening exam initiated at 6:56 AM.  Appropriate orders placed.     Patient made aware this encounter is a triage and screening encounter and no beds are immediately available at this time in the ER.  Patient was informed that the remainder of the evaluation will be completed by another provider.  Patient made aware triage orders have been placed and patient will be placed in the waiting room while work up is initiated and until a room becomes available. Patient encouraged to await a formal ER encounter with a clinician.  Patient made aware that exiting the department prior to formal encounter with an ER clinician and completion of the work-up is considered leaving against medical advice.  At that time there is no guarantee that there are no emergency medical conditions present and patient assumes risks of leaving including worsening condition, permanent disability and death. Patient verbalizes understanding.     Kinnie Feil, PA-C 10/02/20 [id number]    Milton Ferguson, MD 10/04/20 [phone]

## 2020-10-02 NOTE — Discharge Instructions (Addendum)
Stop taking your amlodipine until you follow-up with your cardiologist. Continue taking your other blood pressure medication as prescribed.  Continue taking all your other home medicines as prescribed. Your work-up today was overall reassuring and that there were no signs of infection, lab abnormalities, or strain on your heart. Make sure you are staying well-hydrated water. Return to the emergency room with any new, worsening, concerning symptoms.

## 2020-10-02 NOTE — ED Provider Notes (Signed)
[facility] DEPT Provider Note   CSN: [id number] Arrival date & time: 10/02/20  2330     History Chief Complaint  Patient presents with   Dizziness   Hypotension    Erik Nicholson is a 70 y.o. male presenting for evaluation of dizziness, lightheadedness, and hypotension.   Pt states he has a h/o intermittent dizziness, but over the past couple days, it has been significantly worse.  He has dizziness all the time, which she describes as lightheadedness.  However states that sometimes it feels like he is drunk and/or going to pass out.  Dizziness has been worse in the past 3 days, coming in waves.  He states if he sits still for an hour, dizziness will often resolve.  Dizziness is worse with movement of the head and going from sitting to standing, but happens when seated as well.  Patient reports some blood pressure medication changes recently, states he is on amlodipine, the newest medicine, which he takes once a day in the morning.  He is also on a combo pill that he takes twice a day, and Toprol which he takes daily in the morning.  There is no consistency to the dizziness, and that it is not more likely to occur in the mornings.  He reports no change in appetite.  Drinking 8 cups of liquid a day.  He denies fevers, chills, vision changes, slurred speech, new chest pain or shortness of breath, nausea, vomiting, abdominal pain, urinary symptoms, abnormal bowel movements.  He denies new numbness or weakness.  Additional history taken chart review.  History of anemia, arthritis, BPH, CAD, chronic pain, hyperlipidemia, hypertension, nerve damage in back/neuropathy, prostate cancer status posttreatment, DOE.  HPI     Past Medical History:  Diagnosis Date   Anemia    Arthritis    BPH (benign prostatic hyperplasia)    CAD (coronary artery disease)    x 7 cardiac stents   Chronic back pain    L4/5 with chronic right leg pain    Floater, vitreous, left    Hx  of hepatitis C    treated with Harvoni in 2014   Hyperlipidemia    Hypertension    Neuromuscular disorder ([place])    nerve damage in back   Neuropathy    Prostate cancer ([place])    PVD (peripheral vascular disease) ([place])    2 stents right leg and 1 in left    Ulcerative colitis ([place])    with diarrhea    Patient Active Problem List   Diagnosis Date Noted   Dyspnea on exertion 08/17/2020   History of chronic ulcerative colitis 05/31/2020   Gastric nodule 05/31/2020   Accelerating angina ([place]) 03/15/2020   Chronic right-sided low back pain with right-sided sciatica 02/06/2020   Ulcerative colitis ([place])    Leg edema, right 05/03/2019   Right leg weakness 05/03/2019   Prostate cancer ([place])    Malignant neoplasm of prostate ([place]) 03/28/2019   Lumbar degenerative disc disease 01/17/2019   Chronic radicular lumbar pain (Right) 01/17/2019   History of lumbar surgery (x2) 01/17/2019   Chronic heart failure with preserved ejection fraction (HFpEF) ([place]) 01/07/2019   Claudication in peripheral vascular disease ([place]) 01/07/2019   Hyperlipidemia LDL goal <70 01/07/2019   Ulcerative chronic pancolitis without complications ([place]) 07/62/2633   Hx of hepatitis C 12/23/2018   Rectal urgency 12/23/2018   Other hemorrhoids 12/23/2018   Abdominal gas pain 12/23/2018   Antiplatelet or antithrombotic long-term use 12/23/2018  Intestinal metaplasia of gastric mucosa 12/23/2018   Uncontrolled hypertension 11/24/2018   Chronic pain syndrome 11/24/2018   Ulcerative colitis with complication ([place]) 35/36/1443   Coronary artery disease of native artery of native heart with stable angina pectoris ([place]) 11/24/2018   Peripheral arterial disease ([place]) 11/24/2018   Benign prostatic hyperplasia with post-void dribbling 11/24/2018   Urinary incontinence 11/24/2018   Disorder of ejaculation 11/24/2018   Abnormal weight loss 11/24/2018    Past Surgical History:  Procedure Laterality Date   back surgery      x 2 lumbar last 09/2016 in [place], BILATERAL     COLONOSCOPY     last 09/2018 in [place] N/A 03/21/2020   Procedure: [place];  Surgeon: Nelva Bush, MD;  Location: [facility];  Service: Cardiovascular;  Laterality: N/A;   HERNIA REPAIR     right    INTRAVASCULAR PRESSURE WIRE/FFR STUDY N/A 03/21/2020   Procedure: INTRAVASCULAR PRESSURE WIRE/FFR STUDY;  Surgeon: Nelva Bush, MD;  Location: [facility];  Service: Cardiovascular;  Laterality: N/A;   LEFT HEART CATH AND CORONARY ANGIOGRAPHY N/A 03/21/2020   Procedure: LEFT HEART CATH AND CORONARY ANGIOGRAPHY;  Surgeon: Nelva Bush, MD;  Location: [facility];  Service: Cardiovascular;  Laterality: N/A;   PENILE PROSTHESIS IMPLANT     PERCUTANEOUS CORONARY STENT INTERVENTION (PCI-S)     x 7-8 heart   PERIPHERAL ARTERIAL STENT GRAFT     PROSTATE BIOPSY     pvd     with stenting x 2 right leg below knee and 1 left thigh    SHOULDER ARTHROSCOPY Bilateral    VITRECTOMY     right eye       Family History  Problem Relation Age of Onset   Heart disease Mother    Stroke Mother    Breast cancer Mother    Diabetes Sister    Diabetes Brother    Colon cancer Neg Hx    Esophageal cancer Neg Hx    Inflammatory bowel disease Neg Hx    Liver disease Neg Hx    Pancreatic cancer Neg Hx    Rectal cancer Neg Hx    Stomach cancer Neg Hx     Social History   Tobacco Use   Smoking status: Former    Packs/day: 2.00    Years: 40.00    Pack years: 80.00    Types: Cigarettes    Quit date: 04/20/1998    Years since quitting: 22.4   Smokeless tobacco: Never   Tobacco comments:    quit in 2000s   Vaping Use   Vaping Use: Never used  Substance Use Topics   Alcohol use: Yes    Comment: occasional   Drug use: Not Currently    Home Medications Prior to Admission medications   Medication Sig  Start Date End Date Taking? Authorizing Provider  amLODipine (NORVASC) 5 MG tablet Take 1 tablet (5 mg total) by mouth daily. 08/16/20 11/14/20  End, Harrell Gave, MD  aspirin 81 MG chewable tablet Chew 1 tablet (81 mg total) by mouth daily. 03/22/20   End, Harrell Gave, MD  atorvastatin (LIPITOR) 40 MG tablet TAKE 1 TABLET BY MOUTH EVERY DAY 07/31/20   End, Harrell Gave, MD  clopidogrel (PLAVIX) 75 MG tablet Take 1 tablet (75 mg total) by mouth daily. 07/29/20   End, Harrell Gave, MD  cyclobenzaprine (FLEXERIL) 5 MG tablet TAKE 1-2  TABLETS (5-10 MG TOTAL) BY MOUTH AT BEDTIME AS NEEDED FOR MUSCLE SPASMS. 09/17/20   [name], [name], MD  famotidine (PEPCID) 40 MG tablet TAKE 1 TABLET BY MOUTH TWICE A DAY 07/26/20   Mansouraty, Telford Nab., MD  hyoscyamine (LEVBID) 0.375 MG 12 hr tablet TAKE 1 TABLET (0.375 MG TOTAL) BY MOUTH 2 (TWO) TIMES DAILY. (NOTCOVERED) 10/02/20   Mansouraty, Telford Nab., MD  isosorbide mononitrate (IMDUR) 60 MG 24 hr tablet Take 1 tablet (60 mg total) by mouth daily. 04/05/20   Loel Dubonnet, NP  lidocaine (LIDODERM) 5 % Place 1 patch onto the skin daily as needed (pain). Remove patch after 12 hours. 06/17/18   [provider]  meloxicam (MOBIC) 7.5 MG tablet Take 7.5 mg by mouth daily. 04/26/20   [provider]  mesalamine (LIALDA) 1.2 g EC tablet TAKE 2 TABLETS BY MOUTH DAILY WITH BREAKFAST. 09/09/20   Mansouraty, Telford Nab., MD  metoprolol succinate (TOPROL-XL) 100 MG 24 hr tablet TAKE 1 TABLET BY MOUTH DAILY. TAKE WITH OR IMMEDIATELY FOLLOWING A MEAL. 08/30/20   End, Harrell Gave, MD  nitroGLYCERIN (NITROSTAT) 0.4 MG SL tablet Place 1 tablet (0.4 mg total) under the tongue every 5 (five) minutes as needed for chest pain. If taking 2 call 911 08/04/19 03/15/20  [name], [name], MD  oxyCODONE-acetaminophen (PERCOCET) 5-325 MG tablet Take 1 tablet by mouth 2 (two) times daily as needed for severe pain. 04/02/20   [name], [name], MD  pantoprazole  (PROTONIX) 40 MG tablet TAKE 1 TABLET BY MOUTH EVERY DAY 04/15/20   Loel Dubonnet, NP  potassium chloride 20 MEQ TBCR Take 20 mEq by mouth daily for 7 days. 06/04/20 06/11/20  Mansouraty, Telford Nab., MD  RESTASIS 0.05 % ophthalmic emulsion Place 1 drop into both eyes 2 (two) times daily.  09/14/18   [provider]  tamsulosin (FLOMAX) 0.4 MG CAPS capsule Take 0.4 mg by mouth daily. 08/22/19   [provider]  telmisartan-hydrochlorothiazide (MICARDIS HCT) 80-12.5 MG tablet Take 1 tablet by mouth daily. In am d/c 40-12.5 mg dose 08/06/20   [name], [name], MD  Tiotropium Bromide Monohydrate (SPIRIVA RESPIMAT) 2.5 MCG/ACT AERS Inhale 2 puffs into the lungs daily. 08/20/20   Flora Lipps, MD  Tiotropium Bromide Monohydrate (SPIRIVA RESPIMAT) 2.5 MCG/ACT AERS Inhale 2 puffs into the lungs daily. 08/20/20   Flora Lipps, MD    Allergies    Cymbalta [duloxetine hcl]  Review of Systems   Review of Systems  Neurological:  Positive for dizziness and light-headedness.  All other systems reviewed and are negative.  Physical Exam Updated Vital Signs BP 117/77   Pulse 60   Temp 98.2 F (36.8 C) (Oral)   Resp 19   Ht 5' 7"  (1.702 m)   Wt 77.1 kg   SpO2 97%   BMI 26.63 kg/m   Physical Exam Vitals and nursing note reviewed.  Constitutional:      General: He is not in acute distress.    Appearance: Normal appearance.     Comments: Resting in the bed in NAD  HENT:     Head: Normocephalic and atraumatic.  Eyes:     Conjunctiva/sclera: Conjunctivae normal.     Pupils: Pupils are equal, round, and reactive to light.  Cardiovascular:     Rate and Rhythm: Normal rate and regular rhythm.     Pulses: Normal pulses.  Pulmonary:     Effort: Pulmonary effort is normal. No respiratory distress.     Breath sounds:  Normal breath sounds. No wheezing.     Comments: Speaking in full sentences.  Clear lung sounds in all fields. Abdominal:     General: There is no distension.      Palpations: Abdomen is soft.     Tenderness: There is no abdominal tenderness.  Musculoskeletal:        General: Normal range of motion.     Cervical back: Normal range of motion and neck supple.  Skin:    General: Skin is warm and dry.     Capillary Refill: Capillary refill takes less than 2 seconds.  Neurological:     Mental Status: He is alert and oriented to person, place, and time.     GCS: GCS eye subscore is 4. GCS verbal subscore is 5. GCS motor subscore is 6.     Cranial Nerves: Cranial nerves are intact.     Sensory: Sensory deficit (baseline) present.     Motor: Motor function is intact.     Coordination: Coordination is intact.     Gait: Gait is intact. Gait normal.     Comments: No new neurologic deficits.  CN intact.  Nose to finger intact. Fine movement and coordination intact.  Negative pronator drift and Romberg.  Ambulatory without difficulty or abnormal gait.  Patient with slight decrease sensation of the right lower extremity, which he states is baseline due to his history of back problems.  Psychiatric:        Mood and Affect: Mood and affect normal.        Speech: Speech normal.        Behavior: Behavior normal.    ED Results / Procedures / Treatments   Labs (all labs ordered are listed, but only abnormal results are displayed) Labs Reviewed  CBC WITH DIFFERENTIAL/PLATELET - Abnormal; Notable for the following components:      Result Value   RBC 3.67 (*)    Hemoglobin 12.2 (*)    HCT 37.3 (*)    MCV 101.6 (*)    All other components within normal limits  BASIC METABOLIC PANEL - Abnormal; Notable for the following components:   Glucose, Bld 133 (*)    All other components within normal limits  BRAIN NATRIURETIC PEPTIDE  URINALYSIS, ROUTINE W REFLEX MICROSCOPIC  TROPONIN I (HIGH SENSITIVITY)  TROPONIN I (HIGH SENSITIVITY)    EKG None  Radiology DG Chest 2 View  Result Date: 10/02/2020 CLINICAL DATA:  Dizziness short of breath EXAM: CHEST - 2 VIEW  COMPARISON:  05/17/2020 FINDINGS: Heart size upper normal. Right coronary artery stent. Vascularity normal. Lungs clear without infiltrate or effusion. No acute skeletal abnormality. IMPRESSION: No active cardiopulmonary disease. Electronically Signed   By: Franchot Gallo M.D.   On: 10/02/2020 08:13   CT Head Wo Contrast  Result Date: 10/02/2020 CLINICAL DATA:  Dizziness, acute neuro deficit EXAM: CT HEAD WITHOUT CONTRAST TECHNIQUE: Contiguous axial images were obtained from the base of the skull through the vertex without intravenous contrast. COMPARISON:  None. FINDINGS: Brain: No evidence of acute infarction, hemorrhage, hydrocephalus, extra-axial collection or mass lesion/mass effect. Vascular: Atherosclerotic and physiologic intracranial calcifications. Skull: Normal. Negative for fracture or focal lesion. Sinuses/Orbits: No acute finding. Other: None. IMPRESSION: Negative Electronically Signed   By: Lucrezia Europe M.D.   On: 10/02/2020 10:21    Procedures Procedures   Medications Ordered in ED Medications  meclizine (ANTIVERT) tablet 25 mg (25 mg Oral Given 10/02/20 1120)    ED Course  I have reviewed the triage  vital signs and the nursing notes.  Pertinent labs & imaging results that were available during my care of the patient were reviewed by me and considered in my medical decision making (see chart for details).    MDM Rules/Calculators/A&P                          Patient presenting for evaluation of dizziness/lightheadedness.  On exam, patient is nontoxic.  No neurologic deficits.  Consider pharmacologic cause.  Consider vertigo.  Consider stroke.  Consider infection.  Consider metabolic abnormality or anemia.  Will obtain labs, EKG, CT head, urine.  Will obtain orthostatic vital signs.  If work-up is negative, likely vertigo and/or pharmacologic, and can be treated symptomatically and follow-up.  Labs interpreted by me, overall reassuring.  Hemoglobin at baseline.  Electrolytes are  stable.  Troponin negative x2.  Urine without signs of infection.  Chest x-ray viewed and independently interpreted by me, no pneumonia, thorax effusion.  CT head negative for acute findings.  Orthostatics were negative.  On reassessment, patient states his symptoms had not changed much, but have not gotten worse.  Meclizine did not improve his symptoms greatly.  As patient does describe dizziness is more of a lightheaded feeling, lower suspicion for vertigo.  Patient's blood pressure has been stable in the ER throughout his several hour visit.  Discussed that he should continue to hold his amlodipine, and follow-up closely with his cardiologist.  At this time, patient appears safe for discharge.  Return precautions given.  Patient states he understands and agrees to plan.  Final Clinical Impression(s) / ED Diagnoses Final diagnoses:  Dizziness  Lightheadedness    Rx / DC Orders ED Discharge Orders     None        Franchot Heidelberg, PA-C 10/02/20 1323    Milton Ferguson, MD 10/04/20 [phone]

## 2020-10-07 ENCOUNTER — Other Ambulatory Visit: Payer: Self-pay | Admitting: Family

## 2020-10-16 ENCOUNTER — Ambulatory Visit
Admission: EM | Admit: 2020-10-16 | Discharge: 2020-10-16 | Disposition: A | Discharge status: Home / Self Care | Payer: Medicare Other | Attending: Emergency Medicine | Admitting: Emergency Medicine

## 2020-10-16 ENCOUNTER — Other Ambulatory Visit: Payer: Self-pay

## 2020-10-16 ENCOUNTER — Encounter: Payer: Self-pay | Admitting: Emergency Medicine

## 2020-10-16 DIAGNOSIS — M25431 Effusion, right wrist: Secondary | ICD-10-CM

## 2020-10-16 DIAGNOSIS — M25532 Pain in left wrist: Secondary | ICD-10-CM | POA: Diagnosis not present

## 2020-10-16 DIAGNOSIS — M25531 Pain in right wrist: Secondary | ICD-10-CM

## 2020-10-16 MED ORDER — PREDNISONE 10 MG (21) PO TBPK: ORAL_TABLET | Freq: Every day | ORAL | 0 refills | Status: DC

## 2020-10-16 NOTE — Discharge Instructions (Addendum)
Take the prednisone as directed.  Schedule a follow-up appointment with an orthopedic hand specialist such as the one listed below.

## 2020-10-16 NOTE — ED Provider Notes (Signed)
Erik Nicholson    CSN: [id number] Arrival date & time: 10/16/20  0841      History   Chief Complaint Chief Complaint  Patient presents with   Fall   Arm Injury    bilateral    HPI Erik Nicholson is a 70 y.o. male.  Patient presents with bilateral wrist and hand pain, right worse than left, after tripping and falling in his yard 10 days ago.  No open wounds, redness, bruising, numbness, weakness, paresthesias.  His pain is worse with range of motion in his right wrist; mild pain with range of motion and left.  Treatment attempted with topical pain relieving ointment.  His medical history includes impingement syndrome of left shoulder region, nerve damage and back, chronic back pain, lumbar degenerative disc disease, chronic pain syndrome, neuropathy, uncontrolled hypertension, ulcerative colitis, peripheral artery disease, BPH, long-term anticoagulant use, CHF.  The history is provided by the patient and medical records.   Past Medical History:  Diagnosis Date   Anemia    Arthritis    BPH (benign prostatic hyperplasia)    CAD (coronary artery disease)    x 7 cardiac stents   Chronic back pain    L4/5 with chronic right leg pain    [place], vitreous, left    Hx of hepatitis C    treated with Harvoni in 2014   Hyperlipidemia    Hypertension    Neuromuscular disorder ([place])    nerve damage in back   Neuropathy    Prostate cancer ([place])    PVD (peripheral vascular disease) ([place])    2 stents right leg and 1 in left    Ulcerative colitis ([place])    with diarrhea    Patient Active Problem List   Diagnosis Date Noted   Dyspnea on exertion 08/17/2020   History of chronic ulcerative colitis 05/31/2020   Gastric nodule 05/31/2020   Accelerating angina ([place]) 03/15/2020   Chronic right-sided low back pain with right-sided sciatica 02/06/2020   Ulcerative colitis ([place])    Leg edema, right 05/03/2019   Right leg weakness 05/03/2019   Prostate cancer ([place])    Malignant  neoplasm of prostate ([place]) 03/28/2019   Lumbar degenerative disc disease 01/17/2019   Chronic radicular lumbar pain (Right) 01/17/2019   History of lumbar surgery (x2) 01/17/2019   Chronic heart failure with preserved ejection fraction (HFpEF) ([place]) 01/07/2019   Claudication in peripheral vascular disease ([place]) 01/07/2019   Hyperlipidemia LDL goal <70 01/07/2019   Ulcerative chronic pancolitis without complications ([place]) 60/01/9322   Hx of hepatitis C 12/23/2018   Rectal urgency 12/23/2018   Other hemorrhoids 12/23/2018   Abdominal gas pain 12/23/2018   Antiplatelet or antithrombotic long-term use 12/23/2018   Intestinal metaplasia of gastric mucosa 12/23/2018   Uncontrolled hypertension 11/24/2018   Chronic pain syndrome 11/24/2018   Ulcerative colitis with complication ([place]) 55/73/2202   Coronary artery disease of native artery of native heart with stable angina pectoris ([place]) 11/24/2018   Peripheral arterial disease ([place]) 11/24/2018   Benign prostatic hyperplasia with post-void dribbling 11/24/2018   Urinary incontinence 11/24/2018   Disorder of ejaculation 11/24/2018   Abnormal weight loss 11/24/2018    Past Surgical History:  Procedure Laterality Date   back surgery     x 2 lumbar last 09/2016 in [place], BILATERAL     COLONOSCOPY     last 09/2018 in [place]  03/21/2020   Procedure: CORONARY BALLOON ANGIOPLASTY;  Surgeon: Nelva Bush, MD;  Location: [facility];  Service: Cardiovascular;  Laterality: N/A;   HERNIA REPAIR     right    INTRAVASCULAR PRESSURE WIRE/FFR STUDY N/A 03/21/2020   Procedure: INTRAVASCULAR PRESSURE WIRE/FFR STUDY;  Surgeon: Nelva Bush, MD;  Location: [facility];  Service: Cardiovascular;  Laterality: N/A;   LEFT HEART CATH AND CORONARY ANGIOGRAPHY N/A 03/21/2020   Procedure: LEFT HEART CATH AND CORONARY ANGIOGRAPHY;  Surgeon:  Nelva Bush, MD;  Location: [facility];  Service: Cardiovascular;  Laterality: N/A;   PENILE PROSTHESIS IMPLANT     PERCUTANEOUS CORONARY STENT INTERVENTION (PCI-S)     x 7-8 heart   PERIPHERAL ARTERIAL STENT GRAFT     PROSTATE BIOPSY     pvd     with stenting x 2 right leg below knee and 1 left thigh    SHOULDER ARTHROSCOPY Bilateral    VITRECTOMY     right eye       Home Medications    Prior to Admission medications   Medication Sig Start Date End Date Taking? Authorizing Provider  predniSONE (STERAPRED UNI-PAK 21 TAB) 10 MG (21) TBPK tablet Take by mouth daily. As directed 10/16/20  Yes Sharion Balloon, NP  amLODipine (NORVASC) 5 MG tablet Take 1 tablet (5 mg total) by mouth daily. 08/16/20 11/14/20  End, Harrell Gave, MD  aspirin 81 MG chewable tablet Chew 1 tablet (81 mg total) by mouth daily. 03/22/20   End, Harrell Gave, MD  atorvastatin (LIPITOR) 40 MG tablet TAKE 1 TABLET BY MOUTH EVERY DAY 07/31/20   End, Harrell Gave, MD  clopidogrel (PLAVIX) 75 MG tablet Take 1 tablet (75 mg total) by mouth daily. 07/29/20   End, Harrell Gave, MD  cyclobenzaprine (FLEXERIL) 5 MG tablet TAKE 1-2 TABLETS (5-10 MG TOTAL) BY MOUTH AT BEDTIME AS NEEDED FOR MUSCLE SPASMS. 09/17/20   [name], [name], MD  famotidine (PEPCID) 40 MG tablet TAKE 1 TABLET BY MOUTH TWICE A DAY 07/26/20   Mansouraty, Telford Nab., MD  hyoscyamine (LEVBID) 0.375 MG 12 hr tablet TAKE 1 TABLET (0.375 MG TOTAL) BY MOUTH 2 (TWO) TIMES DAILY. (NOTCOVERED) 10/02/20   Mansouraty, Telford Nab., MD  isosorbide mononitrate (IMDUR) 60 MG 24 hr tablet TAKE 1 TABLET BY MOUTH EVERY DAY 10/07/20   Rise Mu, PA-C  lidocaine (LIDODERM) 5 % Place 1 patch onto the skin daily as needed (pain). Remove patch after 12 hours. 06/17/18   [provider]  meloxicam (MOBIC) 7.5 MG tablet Take 7.5 mg by mouth daily. 04/26/20   [provider]  mesalamine (LIALDA) 1.2 g EC tablet TAKE 2 TABLETS BY MOUTH DAILY WITH BREAKFAST.  09/09/20   Mansouraty, Telford Nab., MD  metoprolol succinate (TOPROL-XL) 100 MG 24 hr tablet TAKE 1 TABLET BY MOUTH DAILY. TAKE WITH OR IMMEDIATELY FOLLOWING A MEAL. 08/30/20   End, Harrell Gave, MD  nitroGLYCERIN (NITROSTAT) 0.4 MG SL tablet Place 1 tablet (0.4 mg total) under the tongue every 5 (five) minutes as needed for chest pain. If taking 2 call 911 08/04/19 03/15/20  [name], [name], MD  oxyCODONE-acetaminophen (PERCOCET) 5-325 MG tablet Take 1 tablet by mouth 2 (two) times daily as needed for severe pain. 04/02/20   [name], [name], MD  pantoprazole (PROTONIX) 40 MG tablet TAKE 1 TABLET BY MOUTH EVERY DAY 04/15/20   Loel Dubonnet, NP  potassium chloride 20 MEQ TBCR Take 20 mEq by mouth daily for 7 days. 06/04/20  06/11/20  Mansouraty, Telford Nab., MD  RESTASIS 0.05 % ophthalmic emulsion Place 1 drop into both eyes 2 (two) times daily.  09/14/18   [provider]  tamsulosin (FLOMAX) 0.4 MG CAPS capsule Take 0.4 mg by mouth daily. 08/22/19   [provider]  telmisartan-hydrochlorothiazide (MICARDIS HCT) 80-12.5 MG tablet Take 1 tablet by mouth daily. In am d/c 40-12.5 mg dose 08/06/20   [name], [name], MD  Tiotropium Bromide Monohydrate (SPIRIVA RESPIMAT) 2.5 MCG/ACT AERS Inhale 2 puffs into the lungs daily. 08/20/20   Flora Lipps, MD  Tiotropium Bromide Monohydrate (SPIRIVA RESPIMAT) 2.5 MCG/ACT AERS Inhale 2 puffs into the lungs daily. 08/20/20   Flora Lipps, MD    Family History Family History  Problem Relation Age of Onset   Heart disease Mother    Stroke Mother    Breast cancer Mother    Diabetes Sister    Diabetes Brother    Colon cancer Neg Hx    Esophageal cancer Neg Hx    Inflammatory bowel disease Neg Hx    Liver disease Neg Hx    Pancreatic cancer Neg Hx    Rectal cancer Neg Hx    Stomach cancer Neg Hx     Social History Social History   Tobacco Use   Smoking status: Former    Packs/day: 2.00    Years: 40.00    Pack years:  80.00    Types: Cigarettes    Quit date: 04/20/1998    Years since quitting: 22.5   Smokeless tobacco: Never   Tobacco comments:    quit in 2000s   Vaping Use   Vaping Use: Never used  Substance Use Topics   Alcohol use: Yes    Comment: occasional   Drug use: Not Currently     Allergies   Cymbalta [duloxetine hcl]   Review of Systems Review of Systems  Constitutional:  Negative for chills and fever.  Respiratory:  Negative for cough and shortness of breath.   Cardiovascular:  Negative for chest pain and palpitations.  Gastrointestinal:  Negative for abdominal pain and vomiting.  Musculoskeletal:  Positive for arthralgias. Negative for joint swelling.  Skin:  Negative for color change, rash and wound.  Neurological:  Negative for weakness and numbness.  All other systems reviewed and are negative.   Physical Exam Triage Vital Signs ED Triage Vitals  Enc Vitals Group     BP      Pulse      Resp      Temp      Temp src      SpO2      Weight      Height      Head Circumference      Peak Flow      Pain Score      Pain Loc      Pain Edu?      Excl. in [place]?    No data found.  Updated Vital Signs BP 108/70 (BP Location: Left Arm)   Pulse 84   Temp 98.6 F (37 C) (Oral)   Resp 18   SpO2 97%   Visual Acuity Right Eye Distance:   Left Eye Distance:   Bilateral Distance:    Right Eye Near:   Left Eye Near:    Bilateral Near:     Physical Exam Vitals and nursing note reviewed.  Constitutional:      General: He is not in acute distress.    Appearance: He is well-developed.  He is not ill-appearing.  HENT:     Head: Normocephalic and atraumatic.     Mouth/Throat:     Mouth: Mucous membranes are moist.  Eyes:     Conjunctiva/sclera: Conjunctivae normal.  Cardiovascular:     Rate and Rhythm: Normal rate and regular rhythm.     Heart sounds: Normal heart sounds.  Pulmonary:     Effort: Pulmonary effort is normal. No respiratory distress.     Breath  sounds: Normal breath sounds.  Abdominal:     Palpations: Abdomen is soft.     Tenderness: There is no abdominal tenderness.  Musculoskeletal:        General: Swelling present. No tenderness, deformity or signs of injury.     Cervical back: Neck supple.     Comments: Limited ROM of right wrist due to discomfort.  Mild edema of right wrist.  No erythema or ecchymosis.  No wounds.  Bilateral UE: 2+ pulses, strength 5/5, sensation intact, brisk capillary refill.  Skin:    General: Skin is warm and dry.     Capillary Refill: Capillary refill takes less than 2 seconds.     Findings: No bruising, erythema, lesion or rash.  Neurological:     General: No focal deficit present.     Mental Status: He is alert and oriented to person, place, and time.     Sensory: No sensory deficit.     Motor: No weakness.  Psychiatric:        Mood and Affect: Mood normal.        Behavior: Behavior normal.     UC Treatments / Results  Labs (all labs ordered are listed, but only abnormal results are displayed) Labs Reviewed - No data to display  EKG   Radiology No results found.  Procedures Procedures (including critical care time)  Medications Ordered in UC Medications - No data to display  Initial Impression / Assessment and Plan / UC Course  I have reviewed the triage vital signs and the nursing notes.  Pertinent labs & imaging results that were available during my care of the patient were reviewed by me and considered in my medical decision making (see chart for details).  Swelling of right wrist, pain in both wrist.  Patient declines x-rays today.  Treating with prednisone taper.  Discussed Tylenol as needed for discomfort.  Instructed him to follow-up with an orthopedic hand specialist.  Contact information for Dr. Caralyn Guile provided as he is on-call for hand today.  Patient agrees to plan of care.   Final Clinical Impressions(s) / UC Diagnoses   Final diagnoses:  Pain in both wrists   Swelling of right wrist     Discharge Instructions      Take the prednisone as directed.  Schedule a follow-up appointment with an orthopedic hand specialist such as the one listed below.     ED Prescriptions     Medication Sig Dispense Auth. Provider   predniSONE (STERAPRED UNI-PAK 21 TAB) 10 MG (21) TBPK tablet Take by mouth daily. As directed 21 tablet Sharion Balloon, NP      I have reviewed the PDMP during this encounter.   Sharion Balloon, NP 10/16/20 [phone]

## 2020-10-16 NOTE — ED Triage Notes (Signed)
Pt presents today with c/o of bilateral hand/wrist pain. He reports falling onto both hands approx 10 days ago.

## 2020-10-28 ENCOUNTER — Other Ambulatory Visit: Payer: Self-pay | Admitting: Internal Medicine

## 2020-10-29 ENCOUNTER — Telehealth: Payer: Medicare Other | Admitting: Physician Assistant

## 2020-10-29 DIAGNOSIS — R079 Chest pain, unspecified: Secondary | ICD-10-CM

## 2020-10-29 DIAGNOSIS — R0602 Shortness of breath: Secondary | ICD-10-CM

## 2020-10-29 DIAGNOSIS — U071 COVID-19: Secondary | ICD-10-CM

## 2020-10-29 NOTE — Progress Notes (Signed)
    E-Visit for [employer] Virus Screening  Based on what you have shared with me, you need to seek an evaluation for a severe COVID-19 illness that is causing your symptoms. I recommend that you be seen and evaluated "face to face". If you are considered high risk for Corona virus because of a known exposure, fever, shortness of breath and cough, OR if you have severe symptoms of any kind, seek medical care at an emergency room. Our Emergency Departments are best equipped to handle patients with severe symptoms.  You will be evaluated by the ER provider (or higher level of care provider) who will determine whether you need formal testing.  If you are having a true medical emergency please call 911.   Based on what you shared with me, I feel your condition warrants further evaluation as soon as possible at an Emergency department.    NOTE: There will be NO CHARGE for this eVisit   If you are having a true medical emergency please call 911.      Emergency [facility]  Get Driving Directions  [phone]  [address]  [place], [place] [address]  Open 24/7/365      [facility] at [place]  [address]  [place], [place] [address]  Open 24/7/365    Emergency [facility]  Get Driving Directions  [phone]  [address], [place] [address]  Open 24/7/365      [facility] at [facility]  Get Driving Directions  [phone]  [address]  [place], [place] [address]  Open 24/7/365    [facility]  Emergency [place]  Get Driving Directions  [phone]  [place], [place] [address]  Open 24/7/365    [place]  Get Driving Directions  [address]  [place], [place] [address]  Open 24/7/365     [facility]  Emergency [facility]  Get Driving Directions  [phone]  [address]  [place], [place] [address]  Open 24/7/365

## 2020-10-31 ENCOUNTER — Emergency Department: Payer: Medicare Other

## 2020-10-31 ENCOUNTER — Encounter: Payer: Self-pay | Admitting: *Deleted

## 2020-10-31 ENCOUNTER — Emergency Department
Admission: EM | Admit: 2020-10-31 | Discharge: 2020-10-31 | Disposition: A | Discharge status: Home / Self Care | Payer: Medicare Other | Attending: Emergency Medicine | Admitting: Emergency Medicine

## 2020-10-31 ENCOUNTER — Other Ambulatory Visit: Payer: Self-pay

## 2020-10-31 DIAGNOSIS — Z7982 Long term (current) use of aspirin: Secondary | ICD-10-CM | POA: Insufficient documentation

## 2020-10-31 DIAGNOSIS — Z8546 Personal history of malignant neoplasm of prostate: Secondary | ICD-10-CM | POA: Diagnosis not present

## 2020-10-31 DIAGNOSIS — R0789 Other chest pain: Secondary | ICD-10-CM | POA: Diagnosis not present

## 2020-10-31 DIAGNOSIS — J209 Acute bronchitis, unspecified: Secondary | ICD-10-CM | POA: Diagnosis not present

## 2020-10-31 DIAGNOSIS — R079 Chest pain, unspecified: Secondary | ICD-10-CM | POA: Diagnosis present

## 2020-10-31 DIAGNOSIS — Z87891 Personal history of nicotine dependence: Secondary | ICD-10-CM | POA: Insufficient documentation

## 2020-10-31 DIAGNOSIS — I509 Heart failure, unspecified: Secondary | ICD-10-CM | POA: Insufficient documentation

## 2020-10-31 DIAGNOSIS — Z79899 Other long term (current) drug therapy: Secondary | ICD-10-CM | POA: Insufficient documentation

## 2020-10-31 DIAGNOSIS — I110 Hypertensive heart disease with heart failure: Secondary | ICD-10-CM | POA: Diagnosis not present

## 2020-10-31 DIAGNOSIS — R0602 Shortness of breath: Secondary | ICD-10-CM | POA: Diagnosis not present

## 2020-10-31 DIAGNOSIS — Z7902 Long term (current) use of antithrombotics/antiplatelets: Secondary | ICD-10-CM | POA: Diagnosis not present

## 2020-10-31 DIAGNOSIS — J40 Bronchitis, not specified as acute or chronic: Secondary | ICD-10-CM | POA: Diagnosis not present

## 2020-10-31 DIAGNOSIS — U071 COVID-19: Secondary | ICD-10-CM | POA: Insufficient documentation

## 2020-10-31 DIAGNOSIS — J208 Acute bronchitis due to other specified organisms: Secondary | ICD-10-CM

## 2020-10-31 DIAGNOSIS — I25118 Atherosclerotic heart disease of native coronary artery with other forms of angina pectoris: Secondary | ICD-10-CM | POA: Diagnosis not present

## 2020-10-31 LAB — TROPONIN I (HIGH SENSITIVITY)
Troponin I (High Sensitivity): 4 ng/L (ref <18)
Troponin I (High Sensitivity): 4 ng/L (ref <18)

## 2020-10-31 LAB — CBC
HCT: 34.4 % — ABNORMAL LOW (ref 39.0–52.0)
Hemoglobin: 11.6 g/dL — ABNORMAL LOW (ref 13.0–17.0)
MCH: 33.9 pg (ref 26.0–34.0)
MCHC: 33.7 g/dL (ref 30.0–36.0)
MCV: 100.6 fL — ABNORMAL HIGH (ref 80.0–100.0)
Platelets: 181 10*3/uL (ref 150–400)
RBC: 3.42 MIL/uL — ABNORMAL LOW (ref 4.22–5.81)
RDW: 12.6 % (ref 11.5–15.5)
WBC: 5.6 10*3/uL (ref 4.0–10.5)
nRBC: 0 % (ref 0.0–0.2)

## 2020-10-31 LAB — BASIC METABOLIC PANEL
Anion gap: 6 (ref 5–15)
BUN: 21 mg/dL (ref 8–23)
CO2: 24 mmol/L (ref 22–32)
Calcium: 9.1 mg/dL (ref 8.9–10.3)
Chloride: 108 mmol/L (ref 98–111)
Creatinine, Ser: 0.75 mg/dL (ref 0.61–1.24)
GFR, Estimated: >60 mL/min (ref >60)
Glucose, Bld: 105 mg/dL — ABNORMAL HIGH (ref 70–99)
Potassium: 3.6 mmol/L (ref 3.5–5.1)
Sodium: 138 mmol/L (ref 135–145)

## 2020-10-31 LAB — D-DIMER, QUANTITATIVE: D-Dimer, Quant: 0.41 ug/mL-FEU (ref 0.00–0.50)

## 2020-10-31 MED ORDER — PANTOPRAZOLE SODIUM 40 MG PO TBEC: 40.0000 mg | DELAYED_RELEASE_TABLET | Freq: Every day | ORAL | 1 refills | Status: DC

## 2020-10-31 MED ORDER — ALBUTEROL SULFATE HFA 108 (90 BASE) MCG/ACT IN AERS: 2.0000 | INHALATION_SPRAY | Freq: Four times a day (QID) | RESPIRATORY_TRACT | 2 refills | Status: DC | PRN

## 2020-10-31 MED ORDER — FAMOTIDINE IN NACL 20-0.9 MG/50ML-% IV SOLN
20.0000 mg | Freq: Once | INTRAVENOUS | Status: AC
  Administered 2020-10-31: 20 mg via INTRAVENOUS
  Filled 2020-10-31: qty 50

## 2020-10-31 MED ORDER — ALUM & MAG HYDROXIDE-SIMETH 200-200-20 MG/5ML PO SUSP
30.0000 mL | Freq: Once | ORAL | Status: AC
  Administered 2020-10-31: 30 mL via ORAL
  Filled 2020-10-31: qty 30

## 2020-10-31 MED ORDER — AZITHROMYCIN 250 MG PO TABS: ORAL_TABLET | ORAL | 0 refills | Status: DC

## 2020-10-31 MED ORDER — PREDNISONE 20 MG PO TABS
60.0000 mg | ORAL_TABLET | Freq: Once | ORAL | Status: AC
  Administered 2020-10-31: 60 mg via ORAL
  Filled 2020-10-31: qty 3

## 2020-10-31 MED ORDER — PREDNISONE 20 MG PO TABS: 60.0000 mg | ORAL_TABLET | Freq: Every day | ORAL | 0 refills | Status: AC

## 2020-10-31 MED ORDER — AZITHROMYCIN 500 MG PO TABS
500.0000 mg | ORAL_TABLET | Freq: Once | ORAL | Status: AC
  Administered 2020-10-31: 500 mg via ORAL
  Filled 2020-10-31: qty 1

## 2020-10-31 NOTE — ED Notes (Signed)
Pt to Room 3 via XR tech at this time.

## 2020-10-31 NOTE — ED Notes (Signed)
ED Provider at bedside. 

## 2020-10-31 NOTE — ED Notes (Signed)
Patient transported to X-ray (aware to transport pt to room 3 when xray complete)

## 2020-10-31 NOTE — ED Notes (Signed)
Report to Kelley, RN  

## 2020-10-31 NOTE — ED Provider Notes (Signed)
[facility] Emergency Department Provider Note  ____________________________________________  Time seen: Approximately 4:50 AM  I have reviewed the triage vital signs and the nursing notes.   HISTORY  Chief Complaint Chest Pain   HPI Erik Nicholson is a 70 y.o. male with a history of CAD status post several stents on Plavix, chronic back pain, hepatitis C status posttreatment, hypertension, hyperlipidemia, remote prostate cancer, ulcerative colitis, peripheral vascular disease who presents for evaluation of chest pain.  Patient reports that he was diagnosed with COVID-19 on July 2.  Over the last week he has been having severe indigestion that he describes as a heaviness in the center of his chest.  Initially intermittent but over the last 2 days has been persistent.  He did a E-visit with his primary care doctor who recommended that he came to the emergency room for evaluation.  He reports that he still has mild dry cough and has shortness of breath associated with the pressure/heaviness in his chest.  No fever or chills, no prior history of PE or DVT, no recent travel or immobilization, no leg pain or swelling, no hemoptysis or exogenous hormones.  No abdominal pain, no wheezing, no vomiting or diarrhea.   Past Medical History:  Diagnosis Date   Anemia    Arthritis    BPH (benign prostatic hyperplasia)    CAD (coronary artery disease)    x 7 cardiac stents   Chronic back pain    L4/5 with chronic right leg pain    [place], vitreous, left    Hx of hepatitis C    treated with Harvoni in 2014   Hyperlipidemia    Hypertension    Neuromuscular disorder ([place])    nerve damage in back   Neuropathy    Prostate cancer ([place])    PVD (peripheral vascular disease) ([place])    2 stents right leg and 1 in left    Ulcerative colitis ([place])    with diarrhea    Patient Active Problem List   Diagnosis Date Noted   Dyspnea on exertion 08/17/2020   History of chronic  ulcerative colitis 05/31/2020   Gastric nodule 05/31/2020   Accelerating angina ([place]) 03/15/2020   Chronic right-sided low back pain with right-sided sciatica 02/06/2020   Ulcerative colitis ([place])    Leg edema, right 05/03/2019   Right leg weakness 05/03/2019   Prostate cancer ([place])    Malignant neoplasm of prostate ([place]) 03/28/2019   Lumbar degenerative disc disease 01/17/2019   Chronic radicular lumbar pain (Right) 01/17/2019   History of lumbar surgery (x2) 01/17/2019   Chronic heart failure with preserved ejection fraction (HFpEF) ([place]) 01/07/2019   Claudication in peripheral vascular disease ([place]) 01/07/2019   Hyperlipidemia LDL goal <70 01/07/2019   Ulcerative chronic pancolitis without complications ([place]) 73/41/9379   Hx of hepatitis C 12/23/2018   Rectal urgency 12/23/2018   Other hemorrhoids 12/23/2018   Abdominal gas pain 12/23/2018   Antiplatelet or antithrombotic long-term use 12/23/2018   Intestinal metaplasia of gastric mucosa 12/23/2018   Uncontrolled hypertension 11/24/2018   Chronic pain syndrome 11/24/2018   Ulcerative colitis with complication ([place]) 02/40/9735   Coronary artery disease of native artery of native heart with stable angina pectoris ([place]) 11/24/2018   Peripheral arterial disease ([place]) 11/24/2018   Benign prostatic hyperplasia with post-void dribbling 11/24/2018   Urinary incontinence 11/24/2018   Disorder of ejaculation 11/24/2018   Abnormal weight loss 11/24/2018    Past Surgical History:  Procedure Laterality Date  back surgery     x 2 lumbar last 09/2016 in [place], BILATERAL     COLONOSCOPY     last 09/2018 in [place] N/A 03/21/2020   Procedure: [place];  Surgeon: Nelva Bush, MD;  Location: [facility];  Service: Cardiovascular;  Laterality: N/A;   HERNIA REPAIR     right    INTRAVASCULAR PRESSURE WIRE/FFR  STUDY N/A 03/21/2020   Procedure: INTRAVASCULAR PRESSURE WIRE/FFR STUDY;  Surgeon: Nelva Bush, MD;  Location: [place] [facility];  Service: Cardiovascular;  Laterality: N/A;   LEFT HEART CATH AND CORONARY ANGIOGRAPHY N/A 03/21/2020   Procedure: LEFT HEART CATH AND CORONARY ANGIOGRAPHY;  Surgeon: Nelva Bush, MD;  Location: [place] [facility];  Service: Cardiovascular;  Laterality: N/A;   PENILE PROSTHESIS IMPLANT     PERCUTANEOUS CORONARY STENT INTERVENTION (PCI-S)     x 7-8 heart   PERIPHERAL ARTERIAL STENT GRAFT     PROSTATE BIOPSY     pvd     with stenting x 2 right leg below knee and 1 left thigh    SHOULDER ARTHROSCOPY Bilateral    VITRECTOMY     right eye    Prior to Admission medications   Medication Sig Start Date End Date Taking? Authorizing Provider  albuterol (VENTOLIN HFA) 108 (90 Base) MCG/ACT inhaler Inhale 2 puffs into the lungs every 6 (six) hours as needed for wheezing or shortness of breath. 10/31/20  Yes Alfred Levins, [place], MD  azithromycin [facility]) 250 MG tablet Take 1 a day for 4 days 10/31/20  Yes Alfred Levins, [place], MD  pantoprazole (PROTONIX) 40 MG tablet Take 1 tablet (40 mg total) by mouth daily. 10/31/20 10/31/21 Yes Cieara Stierwalt, [place], MD  predniSONE (DELTASONE) 20 MG tablet Take 3 tablets (60 mg total) by mouth daily with breakfast for 4 days. 10/31/20 11/04/20 Yes Teri Diltz, [place], MD  amLODipine (NORVASC) 5 MG tablet Take 1 tablet (5 mg total) by mouth daily. 08/16/20 11/14/20  End, Harrell Gave, MD  aspirin 81 MG chewable tablet Chew 1 tablet (81 mg total) by mouth daily. 03/22/20   End, Harrell Gave, MD  atorvastatin (LIPITOR) 40 MG tablet Take 1 tablet (40 mg total) by mouth daily. PLEASE KEEP UPCOMING SCHEDULED APPOINTMENT 10/28/20   End, Harrell Gave, MD  clopidogrel (PLAVIX) 75 MG tablet Take 1 tablet (75 mg total) by mouth daily. 07/29/20   End, Harrell Gave, MD  cyclobenzaprine (FLEXERIL) 5 MG tablet TAKE 1-2 TABLETS (5-10 MG TOTAL) BY MOUTH AT BEDTIME  AS NEEDED FOR MUSCLE SPASMS. 09/17/20   [name], [name], MD  famotidine (PEPCID) 40 MG tablet TAKE 1 TABLET BY MOUTH TWICE A DAY 07/26/20   Mansouraty, Telford Nab., MD  hyoscyamine (LEVBID) 0.375 MG 12 hr tablet TAKE 1 TABLET (0.375 MG TOTAL) BY MOUTH 2 (TWO) TIMES DAILY. (NOTCOVERED) 10/02/20   Mansouraty, Telford Nab., MD  isosorbide mononitrate (IMDUR) 60 MG 24 hr tablet TAKE 1 TABLET BY MOUTH EVERY DAY 10/07/20   Rise Mu, PA-C  lidocaine (LIDODERM) 5 % Place 1 patch onto the skin daily as needed (pain). Remove patch after 12 hours. 06/17/18   [provider]  meloxicam (MOBIC) 7.5 MG tablet Take 7.5 mg by mouth daily. 04/26/20   [provider]  mesalamine (LIALDA) 1.2 g EC tablet TAKE 2 TABLETS BY MOUTH DAILY WITH BREAKFAST. 09/09/20   Mansouraty, Telford Nab., MD  metoprolol succinate (TOPROL-XL) 100 MG 24 hr tablet  TAKE 1 TABLET BY MOUTH DAILY. TAKE WITH OR IMMEDIATELY FOLLOWING A MEAL. 08/30/20   End, Harrell Gave, MD  nitroGLYCERIN (NITROSTAT) 0.4 MG SL tablet Place 1 tablet (0.4 mg total) under the tongue every 5 (five) minutes as needed for chest pain. If taking 2 call 911 08/04/19 03/15/20  [name], [name], MD  oxyCODONE-acetaminophen (PERCOCET) 5-325 MG tablet Take 1 tablet by mouth 2 (two) times daily as needed for severe pain. 04/02/20   [name], [name], MD  potassium chloride 20 MEQ TBCR Take 20 mEq by mouth daily for 7 days. 06/04/20 06/11/20  Mansouraty, Telford Nab., MD  RESTASIS 0.05 % ophthalmic emulsion Place 1 drop into both eyes 2 (two) times daily.  09/14/18   [provider]  tamsulosin (FLOMAX) 0.4 MG CAPS capsule Take 0.4 mg by mouth daily. 08/22/19   [provider]  telmisartan-hydrochlorothiazide (MICARDIS HCT) 80-12.5 MG tablet Take 1 tablet by mouth daily. In am d/c 40-12.5 mg dose 08/06/20   [name], [name], MD  Tiotropium Bromide Monohydrate (SPIRIVA RESPIMAT) 2.5 MCG/ACT AERS Inhale 2 puffs into the lungs daily.  08/20/20   Flora Lipps, MD  Tiotropium Bromide Monohydrate (SPIRIVA RESPIMAT) 2.5 MCG/ACT AERS Inhale 2 puffs into the lungs daily. 08/20/20   Flora Lipps, MD    Allergies Cymbalta [duloxetine hcl]  Family History  Problem Relation Age of Onset   Heart disease Mother    Stroke Mother    Breast cancer Mother    Diabetes Sister    Diabetes Brother    Colon cancer Neg Hx    Esophageal cancer Neg Hx    Inflammatory bowel disease Neg Hx    Liver disease Neg Hx    Pancreatic cancer Neg Hx    Rectal cancer Neg Hx    Stomach cancer Neg Hx     Social History Social History   Tobacco Use   Smoking status: Former    Packs/day: 2.00    Years: 40.00    Pack years: 80.00    Types: Cigarettes    Quit date: 04/20/1998    Years since quitting: 22.5   Smokeless tobacco: Never   Tobacco comments:    quit in 2000s   Vaping Use   Vaping Use: Never used  Substance Use Topics   Alcohol use: Yes    Comment: occasional   Drug use: Not Currently    Review of Systems  Constitutional: Negative for fever. Eyes: Negative for visual changes. ENT: Negative for sore throat. Neck: No neck pain  Cardiovascular: + chest pain. Respiratory: + shortness of breath. Gastrointestinal: Negative for abdominal pain, vomiting or diarrhea. Genitourinary: Negative for dysuria. Musculoskeletal: Negative for back pain. Skin: Negative for rash. Neurological: Negative for headaches, weakness or numbness. Psych: No SI or HI  ____________________________________________   PHYSICAL EXAM:  VITAL SIGNS: ED Triage Vitals  Enc Vitals Group     BP 10/31/20 0424 (!) 127/91     Pulse Rate 10/31/20 0424 77     Resp 10/31/20 0424 18     Temp 10/31/20 0424 99.1 F (37.3 C)     Temp Source 10/31/20 0424 Oral     SpO2 10/31/20 0424 99 %     Weight 10/31/20 0427 165 lb (74.8 kg)     Height 10/31/20 0427 5' 7"  (1.702 m)     Head Circumference --      Peak Flow --      Pain Score 10/31/20 0424 7     Pain Loc  --  Pain Edu? --      Excl. in [place]? --     Constitutional: Alert and oriented. Well appearing and in no apparent distress. HEENT:      Head: Normocephalic and atraumatic.         Eyes: Conjunctivae are normal. Sclera is non-icteric.       Mouth/Throat: Mucous membranes are moist.       Neck: Supple with no signs of meningismus. Cardiovascular: Regular rate and rhythm. No murmurs, gallops, or rubs. 2+ symmetrical distal pulses are present in all extremities. No JVD. Respiratory: Normal respiratory effort. Lungs are clear to auscultation bilaterally.  Gastrointestinal: Soft, non tender, and non distended with positive bowel sounds. No rebound or guarding. Genitourinary: No CVA tenderness. Musculoskeletal:  No edema, cyanosis, or erythema of extremities. Neurologic: Normal speech and language. Face is symmetric. Moving all extremities. No gross focal neurologic deficits are appreciated. Skin: Skin is warm, dry and intact. No rash noted. Psychiatric: Mood and affect are normal. Speech and behavior are normal.  ____________________________________________   LABS (all labs ordered are listed, but only abnormal results are displayed)  Labs Reviewed  BASIC METABOLIC PANEL - Abnormal; Notable for the following components:      Result Value   Glucose, Bld 105 (*)    All other components within normal limits  CBC - Abnormal; Notable for the following components:   RBC 3.42 (*)    Hemoglobin 11.6 (*)    HCT 34.4 (*)    MCV 100.6 (*)    All other components within normal limits  D-DIMER, QUANTITATIVE  TROPONIN I (HIGH SENSITIVITY)  TROPONIN I (HIGH SENSITIVITY)   ____________________________________________  EKG  ED ECG REPORT I, Rudene Re, the attending physician, personally viewed and interpreted this ECG.  Sinus rhythm with occasional PACs, rate of 76, normal intervals, normal axis, no ST elevations or  depressions. ____________________________________________  RADIOLOGY  I have personally reviewed the images performed during this visit and I agree with the Radiologist's read.   Interpretation by Radiologist:  DG Chest 2 View  Result Date: 10/31/2020 CLINICAL DATA:  70 year old male with history of chest pain and shortness of breath. EXAM: CHEST - 2 VIEW COMPARISON:  Chest x-ray 10/02/2020. FINDINGS: Lung volumes are normal. No consolidative airspace disease. No pleural effusions. No pneumothorax. No pulmonary nodule or mass noted. Pulmonary vasculature and the cardiomediastinal silhouette are within normal limits. Atherosclerosis in the thoracic aorta. IMPRESSION: 1. No radiographic evidence of acute cardiopulmonary disease. 2. Aortic atherosclerosis. Electronically Signed   By: Vinnie Langton M.D.   On: 10/31/2020 05:06     ____________________________________________   PROCEDURES  Procedure(s) performed:yes .1-3 Lead EKG Interpretation  Date/Time: 10/31/2020 4:52 AM Performed by: Rudene Re, MD Authorized by: Rudene Re, MD     Interpretation: non-specific     ECG rate assessment: normal     Rhythm: sinus rhythm     Ectopy: PAC     Conduction: normal   Critical Care performed:  None ____________________________________________   INITIAL IMPRESSION / ASSESSMENT AND PLAN / ED COURSE  70 y.o. male with a history of CAD status post several stents on Plavix, chronic back pain, hepatitis C status posttreatment, hypertension, hyperlipidemia, remote prostate cancer, ulcerative colitis, peripheral vascular disease who presents for evaluation of " indigestion" which she describes as heaviness in his chest associated with shortness of breath and cough intermittent for the last week and constant for the last 2 days.  Patient is otherwise well-appearing in no distress with normal vital  signs, normal work of breathing and normal sats, lungs are clear to auscultation with  good air movement, no wheezing or crackles.  Patient looks euvolemic with no asymmetric leg swelling or edema.  Abdomen is soft and nontender.  EKG showing no signs of acute ischemia.  Ddx covid PNA, bacterial pneumonia, bronchitis, myocarditis, pericarditis, indigestion/GERD, PE, pericardial effusion.  Will get chest x-ray, cardiac enzymes, D-dimer, basic labs.  Will give Maalox and IV Pepcid.  Patient placed on telemetry for close monitoring of cardiorespiratory status.  Old medical records reviewed  _________________________ 6:08 AM on 10/31/2020 ----------------------------------------- Chest x-ray visualized by me with no acute findings, confirmed by radiology.  For troponin is negative.  D-dimer is normal.  Basic labs with no significant abnormalities.  Second troponin is due at 7 AM.  Since patient has a persistent cough we will treat as bronchitis with Z-Pak, inhaler, and some prednisone as long as repeat troponin is negative.  _________________________ 6:48 AM on 10/31/2020 ----------------------------------------- Patient feels improved after receiving IV Pepcid and Maalox.  Second troponin is pending.  Care transferred to incoming MD    _____________________________________________ Please note:  Patient was evaluated in Emergency Department today for the symptoms described in the history of present illness. Patient was evaluated in the context of the global COVID-19 pandemic, which necessitated consideration that the patient might be at risk for infection with the SARS-CoV-2 virus that causes COVID-19. Institutional protocols and algorithms that pertain to the evaluation of patients at risk for COVID-19 are in a state of rapid change based on information released by regulatory bodies including the CDC and federal and state organizations. These policies and algorithms were followed during the patient's care in the ED.  Some ED evaluations and interventions may be delayed as a result of  limited staffing during the pandemic.   [place] Controlled Substance Database was reviewed by me. ____________________________________________   FINAL CLINICAL IMPRESSION(S) / ED DIAGNOSES   Final diagnoses:  Chest pain, unspecified type  Acute bronchitis due to COVID-19 virus      NEW MEDICATIONS STARTED DURING THIS VISIT:  ED Discharge Orders          Ordered    azithromycin (ZITHROMAX) 250 MG tablet        10/31/20 0610    predniSONE (DELTASONE) 20 MG tablet  Daily with breakfast        10/31/20 0610    albuterol (VENTOLIN HFA) 108 (90 Base) MCG/ACT inhaler  Every 6 hours PRN        10/31/20 0610    pantoprazole (PROTONIX) 40 MG tablet  Daily        10/31/20 0647             Note:  This document was prepared using Dragon voice recognition software and may include unintentional dictation errors.    Alfred Levins, [place], MD 10/31/20 [phone]

## 2020-10-31 NOTE — ED Provider Notes (Signed)
-----------------------------------------   7:49 AM on 10/31/2020 ----------------------------------------- Patient's repeat troponin remains negative.  Given the patient's reassuring work-up highly suspect symptoms are related to recent COVID diagnosis.  I believe patient is safe for discharge home with outpatient follow-up.  Patient is feeling much better and is agreeable to plan of care.   Harvest Dark, MD 10/31/20 [phone]

## 2020-10-31 NOTE — Discharge Instructions (Addendum)

## 2020-10-31 NOTE — ED Triage Notes (Signed)
Pt reporting a heaviness/pressure in his chest, "feels like indigestion. COVID + July 2nd, still has a cough, some SOB.

## 2020-11-05 ENCOUNTER — Encounter: Payer: Self-pay | Admitting: Internal Medicine

## 2020-11-05 ENCOUNTER — Telehealth (INDEPENDENT_AMBULATORY_CARE_PROVIDER_SITE_OTHER): Payer: Medicare Other | Admitting: Internal Medicine

## 2020-11-05 ENCOUNTER — Other Ambulatory Visit: Payer: Self-pay

## 2020-11-05 VITALS — BP 172/71 | HR 71 | Ht 67.0 in | Wt 170.0 lb

## 2020-11-05 DIAGNOSIS — U071 COVID-19: Secondary | ICD-10-CM

## 2020-11-05 DIAGNOSIS — I10 Essential (primary) hypertension: Secondary | ICD-10-CM

## 2020-11-05 DIAGNOSIS — J9801 Acute bronchospasm: Secondary | ICD-10-CM

## 2020-11-05 MED ORDER — AMLODIPINE BESYLATE 10 MG PO TABS: 10.0000 mg | ORAL_TABLET | Freq: Every day | ORAL | 3 refills | Status: DC

## 2020-11-05 MED ORDER — ALBUTEROL SULFATE HFA 108 (90 BASE) MCG/ACT IN AERS: 1.0000 | INHALATION_SPRAY | Freq: Four times a day (QID) | RESPIRATORY_TRACT | 2 refills | Status: DC | PRN

## 2020-11-05 NOTE — Progress Notes (Signed)
Tested positive July 3rd. Patient had a bad cough and mild fever. Patient had chills, SOB that got severe enough for a hospital visit 1 week ago. Patient states he is still testing positive.

## 2020-11-07 ENCOUNTER — Ambulatory Visit: Payer: Medicare Other | Admitting: Internal Medicine

## 2020-11-08 ENCOUNTER — Other Ambulatory Visit: Payer: Self-pay | Admitting: Internal Medicine

## 2020-11-08 DIAGNOSIS — I2510 Atherosclerotic heart disease of native coronary artery without angina pectoris: Secondary | ICD-10-CM

## 2020-11-12 ENCOUNTER — Other Ambulatory Visit: Payer: Self-pay | Admitting: Internal Medicine

## 2020-11-12 DIAGNOSIS — I2510 Atherosclerotic heart disease of native coronary artery without angina pectoris: Secondary | ICD-10-CM

## 2020-11-15 NOTE — Progress Notes (Signed)
Virtual Visit via Video Note  I connected with Erik Nicholson  on 11/05/20 at  4:00 PM EDT by a video enabled telemedicine application and verified that I am speaking with the correct person using two identifiers.  Location patient: home, [place] Location provider:work or home office Persons participating in the virtual visit: patient, provider  I discussed the limitations of evaluation and management by telemedicine and the availability of in person appointments. The patient expressed understanding and agreed to proceed.   HPI:  Acute telemedicine visit for : Covid + 10/20/20 fatigue, body aches, h/a fever 99, cold chills home test + had sob but now O2 98-99% had cough cxr 10/31/20 no pneumonia given zack prednisone, albuterol and protonix due to was also having chest tightness today feeling better at the [employer] with his wife and she is getting new car. He was also given spiriva    Htn elevated on norasc 5 mg qd , imdur 60 mg qd, toprol xl 100 mg qd, micardis 80-12.5 mg qd  -COVID-19 vaccine status: 3/3  ROS: See pertinent positives and negatives per HPI.  Past Medical History:  Diagnosis Date   Anemia    Arthritis    BPH (benign prostatic hyperplasia)    CAD (coronary artery disease)    x 7 cardiac stents   Chronic back pain    L4/5 with chronic right leg pain    COVID-19    10/20/20   [place], vitreous, left    Hx of hepatitis C    treated with Harvoni in 2014   Hyperlipidemia    Hypertension    Neuromuscular disorder ([place])    nerve damage in back   Neuropathy    Prostate cancer ([place])    PVD (peripheral vascular disease) ([place])    2 stents right leg and 1 in left    Ulcerative colitis ([place])    with diarrhea    Past Surgical History:  Procedure Laterality Date   back surgery     x 2 lumbar last 09/2016 in [place], BILATERAL     COLONOSCOPY     last 09/2018 in [place] [facility] N/A 03/21/2020   Procedure: [place];  Surgeon: Nelva Bush, MD;  Location: [facility];  Service: Cardiovascular;  Laterality: N/A;   HERNIA REPAIR     right    INTRAVASCULAR PRESSURE WIRE/FFR STUDY N/A 03/21/2020   Procedure: INTRAVASCULAR PRESSURE WIRE/FFR STUDY;  Surgeon: Nelva Bush, MD;  Location: [facility];  Service: Cardiovascular;  Laterality: N/A;   LEFT HEART CATH AND CORONARY ANGIOGRAPHY N/A 03/21/2020   Procedure: LEFT HEART CATH AND CORONARY ANGIOGRAPHY;  Surgeon: Nelva Bush, MD;  Location: [facility];  Service: Cardiovascular;  Laterality: N/A;   PENILE PROSTHESIS IMPLANT     PERCUTANEOUS CORONARY STENT INTERVENTION (PCI-S)     x 7-8 heart   PERIPHERAL ARTERIAL STENT GRAFT     PROSTATE BIOPSY     pvd     with stenting x 2 right leg below knee and 1 left thigh    SHOULDER ARTHROSCOPY Bilateral    VITRECTOMY     right eye     Current Outpatient Medications:    atorvastatin (LIPITOR) 40 MG tablet, Take 1 tablet (40 mg total) by mouth daily. PLEASE KEEP UPCOMING SCHEDULED APPOINTMENT, Disp: 90 tablet, Rfl: 0   clopidogrel (PLAVIX) 75 MG tablet, Take 1 tablet (  75 mg total) by mouth daily., Disp: 90 tablet, Rfl: 3   cyclobenzaprine (FLEXERIL) 5 MG tablet, TAKE 1-2 TABLETS (5-10 MG TOTAL) BY MOUTH AT BEDTIME AS NEEDED FOR MUSCLE SPASMS., Disp: 60 tablet, Rfl: 11   famotidine (PEPCID) 40 MG tablet, TAKE 1 TABLET BY MOUTH TWICE A DAY, Disp: 180 tablet, Rfl: 0   hyoscyamine (LEVBID) 0.375 MG 12 hr tablet, TAKE 1 TABLET (0.375 MG TOTAL) BY MOUTH 2 (TWO) TIMES DAILY. (NOTCOVERED), Disp: 180 tablet, Rfl: 1   isosorbide mononitrate (IMDUR) 60 MG 24 hr tablet, TAKE 1 TABLET BY MOUTH EVERY DAY, Disp: 90 tablet, Rfl: 0   lidocaine (LIDODERM) 5 %, Place 1 patch onto the skin daily as needed (pain). Remove patch after 12 hours., Disp: , Rfl:    meloxicam (MOBIC) 7.5 MG tablet, Take 7.5 mg by mouth daily., Disp: , Rfl:     mesalamine (LIALDA) 1.2 g EC tablet, TAKE 2 TABLETS BY MOUTH DAILY WITH BREAKFAST., Disp: 180 tablet, Rfl: 1   metoprolol succinate (TOPROL-XL) 100 MG 24 hr tablet, TAKE 1 TABLET BY MOUTH DAILY. TAKE WITH OR IMMEDIATELY FOLLOWING A MEAL., Disp: 90 tablet, Rfl: 1   pantoprazole (PROTONIX) 40 MG tablet, Take 1 tablet (40 mg total) by mouth daily., Disp: 30 tablet, Rfl: 1   potassium chloride 20 MEQ TBCR, Take 20 mEq by mouth daily for 7 days., Disp: 7 tablet, Rfl: 0   RESTASIS 0.05 % ophthalmic emulsion, Place 1 drop into both eyes 2 (two) times daily. , Disp: , Rfl:    telmisartan-hydrochlorothiazide (MICARDIS HCT) 80-12.5 MG tablet, Take 1 tablet by mouth daily. In am d/c 40-12.5 mg dose, Disp: 90 tablet, Rfl: 3   albuterol (VENTOLIN HFA) 108 (90 Base) MCG/ACT inhaler, Inhale 1-2 puffs into the lungs every 6 (six) hours as needed for wheezing or shortness of breath., Disp: 18 g, Rfl: 2   amLODipine (NORVASC) 10 MG tablet, Take 1 tablet (10 mg total) by mouth daily., Disp: 90 tablet, Rfl: 3   aspirin 81 MG chewable tablet, Chew 1 tablet (81 mg total) by mouth daily. (Patient not taking: Reported on 11/05/2020), Disp: , Rfl:    azithromycin (ZITHROMAX) 250 MG tablet, Take 1 a day for 4 days (Patient not taking: Reported on 11/05/2020), Disp: 4 each, Rfl: 0   nitroGLYCERIN (NITROSTAT) 0.4 MG SL tablet, TAKE 1 TABLET BY MOUTH SUBLINGUALLY EVERY 5 MINS AS NEEDED FOR CHEST PAIN. IF >1 NEEDED, CALL 911, Disp: 75 tablet, Rfl: 4   oxyCODONE-acetaminophen (PERCOCET) 5-325 MG tablet, Take 1 tablet by mouth 2 (two) times daily as needed for severe pain. (Patient not taking: Reported on 11/05/2020), Disp: 14 tablet, Rfl: 0   tamsulosin (FLOMAX) 0.4 MG CAPS capsule, Take 0.4 mg by mouth daily. (Patient not taking: Reported on 11/05/2020), Disp: , Rfl:    Tiotropium Bromide Monohydrate (SPIRIVA RESPIMAT) 2.5 MCG/ACT AERS, Inhale 2 puffs into the lungs daily. (Patient not taking: Reported on 11/05/2020), Disp: 1 each, Rfl:  5   Tiotropium Bromide Monohydrate (SPIRIVA RESPIMAT) 2.5 MCG/ACT AERS, Inhale 2 puffs into the lungs daily. (Patient not taking: Reported on 11/05/2020), Disp: 4 g, Rfl: 0  EXAM:  VITALS per patient if applicable:  GENERAL: alert, oriented, appears well and in no acute distress  HEENT: atraumatic, conjunttiva clear, no obvious abnormalities on inspection of external nose and ears  NECK: normal movements of the head and neck  LUNGS: on inspection no signs of respiratory distress, breathing rate appears normal, no obvious gross SOB, gasping or  wheezing  CV: no obvious cyanosis  MS: moves all visible extremities without noticeable abnormality  PSYCH/NEURO: pleasant and cooperative, no obvious depression or anxiety, speech and thought processing grossly intact  ASSESSMENT AND PLAN:  Discussed the following assessment and plan:  COVID-19 - Plan: albuterol (VENTOLIN HFA) 108 (90 Base) MCG/ACT inhaler Bronchospasm - Plan: albuterol (VENTOLIN HFA) 108 (90 Base) MCG/ACT inhaler Spiriva 2.5 also has on zpack as well doing better  If not better consider add symbicort in the future s/o covid + 10/20/20  Rec mvt and supportive care   Hypertension, unspecified type - Plan: amLODipine (NORVASC) 10 MG tablet increased from 5  norasc 5 mg qd , imdur 60 mg qd, toprol xl 100 mg qd, micardis 80-12.5 mg qd Monitor BP   -we discussed possible serious and likely etiologies, options for evaluation and workup, limitations of telemedicine visit vs in person visit, treatment, treatment risks and precautions. Pt prefers to treat via telemedicine empirically rather than in person at this moment.     I discussed the assessment and treatment plan with the patient. The patient was provided an opportunity to ask questions and all were answered. The patient agreed with the plan and demonstrated an understanding of the instructions.    Time spent 20 minutes Delorise Jackson, MD

## 2020-11-18 ENCOUNTER — Other Ambulatory Visit: Payer: Self-pay

## 2020-11-18 ENCOUNTER — Ambulatory Visit: Payer: Medicare Other | Admitting: Physician Assistant

## 2020-11-18 ENCOUNTER — Encounter: Payer: Self-pay | Admitting: Physician Assistant

## 2020-11-18 VITALS — BP 120/68 | HR 71 | Ht 67.0 in | Wt 175.0 lb

## 2020-11-18 DIAGNOSIS — I10 Essential (primary) hypertension: Secondary | ICD-10-CM | POA: Diagnosis not present

## 2020-11-18 DIAGNOSIS — E785 Hyperlipidemia, unspecified: Secondary | ICD-10-CM | POA: Diagnosis not present

## 2020-11-18 DIAGNOSIS — I25118 Atherosclerotic heart disease of native coronary artery with other forms of angina pectoris: Secondary | ICD-10-CM

## 2020-11-18 DIAGNOSIS — J449 Chronic obstructive pulmonary disease, unspecified: Secondary | ICD-10-CM

## 2020-11-18 DIAGNOSIS — R0600 Dyspnea, unspecified: Secondary | ICD-10-CM | POA: Diagnosis not present

## 2020-11-18 DIAGNOSIS — R0609 Other forms of dyspnea: Secondary | ICD-10-CM

## 2020-11-18 DIAGNOSIS — I739 Peripheral vascular disease, unspecified: Secondary | ICD-10-CM | POA: Diagnosis not present

## 2020-11-18 NOTE — Patient Instructions (Addendum)
Medication Instructions:  No changes at this time.  *If you need a refill on your cardiac medications before your next appointment, please call your pharmacy*   Lab Work: None  If you have labs (blood work) drawn today and your tests are completely normal, you will receive your results only by: [facility] (if you have MyChart) OR A paper copy in the mail If you have any lab test that is abnormal or we need to change your treatment, we will call you to review the results.   Testing/Procedures: [place]  Your caregiver has ordered a Stress Test with nuclear imaging.   The purpose of this test is to evaluate the blood supply to your heart muscle. This procedure is referred to as a "Non-Invasive Stress Test." This is because other than having an IV started in your vein, nothing is inserted or "invades" your body. Cardiac stress tests are done to find areas of poor blood flow to the heart by determining the extent of coronary artery disease (CAD). Some patients exercise on a treadmill, which naturally increases the blood flow to your heart, while others who are  unable to walk on a treadmill due to physical limitations have a pharmacologic/chemical stress agent called Lexiscan . This medicine will mimic walking on a treadmill by temporarily increasing your coronary blood flow.   Please note: these test may take anywhere between 2-4 hours to complete  PLEASE REPORT TO [facility] AT THE FIRST DESK WILL DIRECT YOU WHERE TO GO  Date of Procedure:_____________________________________  Arrival Time for Procedure:______________________________   PLEASE NOTIFY THE OFFICE AT LEAST 24 HOURS IN ADVANCE IF YOU ARE UNABLE TO KEEP YOUR APPOINTMENT.  [phone] AND  PLEASE NOTIFY NUCLEAR MEDICINE AT [facility] AT LEAST 24 HOURS IN ADVANCE IF YOU ARE UNABLE TO KEEP YOUR APPOINTMENT. [phone]  How to prepare for your Myoview test:  Do not eat or drink after  midnight No caffeine for 24 hours prior to test No smoking 24 hours prior to test. Your medication may be taken with water.  If your doctor stopped a medication because of this test, do not take that medication. Ladies, please do not wear dresses.  Skirts or pants are appropriate. Please wear a short sleeve shirt. No perfume, cologne or lotion. Wear comfortable walking shoes. No heels!    Follow-Up: At [facility], you and your health needs are our priority.  As part of our continuing mission to provide you with exceptional heart care, we have created designated Provider Care Teams.  These Care Teams include your primary Cardiologist (physician) and Advanced Practice Providers (APPs -  Physician Assistants and Nurse Practitioners) who all work together to provide you with the care you need, when you need it.   Your next appointment:   3 month(s)  The format for your next appointment:   In Person  Provider:   Nelva Bush, MD or Christell Faith, PA-C

## 2020-11-18 NOTE — Progress Notes (Signed)
Cardiology Office Note    Date:  11/18/2020   ID:  Erik Nicholson, DOB [birth year], MRN [id number]  PCP:  [name], [name], MD  Cardiologist:  Nelva Bush, MD  Electrophysiologist:  None   Chief Complaint: Follow-up  History of Present Illness:   Erik Nicholson is a 70 y.o. male with history of CAD status post multiple PCI's, PVD status post bilateral lower extremity stenting followed by vascular surgery, ulcerative colitis, COVID infection in 10/2020, hepatitis C, probable COPD, HTN, HLD, prostate cancer, and BPH who presents for follow-up of his CAD.  With regards to his CAD and PVD, notes indicate he has a history of 7 coronary and 3 lower extremity stents while living in the [place] area.  Most recent [place] in 03/2020 demonstrated severe single-vessel CAD with up to 75% ISR of the mid/distal RCA stent as well as 60 to 70% ostial stenosis of RPDA and RPL 1.  There was mild to moderate noncritical left coronary disease with most severe lesion located in a small to moderate caliber D1 branch that was jailed by prior LAD stent with up to 70% stenosis.  Normal LV systolic function with mildly elevated filling pressures.  He underwent PTCA of the mid/distal RCA with reduction in stenosis from 75% to 20% with TIMI-3 flow.  Stent placement was not attempted due to multiple layers of stent already present and incomplete expansion of a 3.5 mm noncompliant balloon at 22 ATM.  With percutaneous intervention he did note improvement in chest pain, though did continue to have significant exertional dyspnea.  PFTs showed probable small obstructive airway disease with bronchodilator response, for which she is followed by pulmonology.  Most recent echo from 05/14/2020 showed an EF of 55 to 60%, no regional wall motion abnormalities, mild LVH, grade 1 diastolic dysfunction, normal RV systolic function and ventricular cavity size, trivial mitral regurgitation, and an estimated right atrial pressure of 3  mmHg.  He was last seen in the office in 07/2020 and continued to feel about the same as he had on his prior visit with mild chest tightness rated a 1 out of 10 with briskly walking.  His chronic exertional dyspnea was stable.  Chronic leg pain and mild lower extremity edema were stable.  BP was elevated in the 562Z systolic.  He noted his telmisartan/HCTZ had recently been doubled, though despite this his BP remained elevated.  In this setting, he was started on amlodipine 5 mg.  He was seen in the ED on 10/02/2020 with intermittent acute on chronic dizziness that was worse with rotational movements of the head and positional changes.  High-sensitivity troponin negative x2.  BNP 11.  CT head showed no acute intracranial pathology.  Symptoms did not improve with meclizine.  He was advised to hold amlodipine. Marland Kitchen He tested positive for COVID infection on 10/20/2020.  He was seen in the ED on 10/31/2020 with intermittent chest discomfort, shortness of breath, and a mild dry cough.  High-sensitivity troponin negative x2.  D-dimer normal.  EKG showed sinus rhythm with PACs and possible prior inferior infarct.  Chest x-ray showed no evidence of acute cardiopulmonary disease.  Symptoms were felt to be in the setting of his recent COVID diagnosis versus GI in etiology with noted symptom improvement following IV Pepcid and Maalox.  He was treated with a Z-Pak, prednisone, and an inhaler.  He comes in doing reasonably well from a cardiac perspective.  He has not had any recent chest pain.  He does continue to note chronic exertional dyspnea with activities such as climbing stairs or walking at a brisk pace.  This dyspnea is unchanged.  He indicates if he takes his time the shortness of breath is not as prevalent.  No lower extremity swelling, abdominal distention, or orthopnea.  He has not noted a significant improvement in his dyspnea with his inhalers.  He does seem to know improvement in symptoms when he is prescribed  prednisone.  Blood pressure is improved.  He is tolerating all cardiac medications without issues.  At times he does miss some doses of aspirin though not clopidogrel.  He is recovering well from [place].   Labs independently reviewed: 10/2020 - Hgb 11.6, PLT 181, potassium 3.6, BUN 21, serum creatinine 0.75 07/2020 - magnesium 1.9, albumin 3.8, AST/ALT normal 01/2020 - A1c 4.9, TC 126, TG 113, HDL 58, LDL 45, TSH normal  Past Medical History:  Diagnosis Date   Anemia    Arthritis    BPH (benign prostatic hyperplasia)    CAD (coronary artery disease)    x 7 cardiac stents   Chronic back pain    L4/5 with chronic right leg pain    COVID-19    10/20/20   [place], vitreous, left    Hx of hepatitis C    treated with Harvoni in 2014   Hyperlipidemia    Hypertension    Neuromuscular disorder ([place])    nerve damage in back   Neuropathy    Prostate cancer ([place])    PVD (peripheral vascular disease) ([place])    2 stents right leg and 1 in left    Ulcerative colitis ([place])    with diarrhea    Past Surgical History:  Procedure Laterality Date   back surgery     x 2 lumbar last 09/2016 in [facility], BILATERAL     COLONOSCOPY     last 09/2018 in [facility] N/A 03/21/2020   Procedure: [place];  Surgeon: Nelva Bush, MD;  Location: [facility];  Service: Cardiovascular;  Laterality: N/A;   HERNIA REPAIR     right    INTRAVASCULAR PRESSURE WIRE/FFR STUDY N/A 03/21/2020   Procedure: INTRAVASCULAR PRESSURE WIRE/FFR STUDY;  Surgeon: Nelva Bush, MD;  Location: [facility];  Service: Cardiovascular;  Laterality: N/A;   LEFT HEART CATH AND CORONARY ANGIOGRAPHY N/A 03/21/2020   Procedure: LEFT HEART CATH AND CORONARY ANGIOGRAPHY;  Surgeon: Nelva Bush, MD;  Location: [facility];  Service: Cardiovascular;  Laterality: N/A;   PENILE PROSTHESIS IMPLANT      PERCUTANEOUS CORONARY STENT INTERVENTION (PCI-S)     x 7-8 heart   PERIPHERAL ARTERIAL STENT GRAFT     PROSTATE BIOPSY     pvd     with stenting x 2 right leg below knee and 1 left thigh    SHOULDER ARTHROSCOPY Bilateral    VITRECTOMY     right eye    Current Medications: Current Meds  Medication Sig   albuterol (VENTOLIN HFA) 108 (90 Base) MCG/ACT inhaler Inhale 1-2 puffs into the lungs every 6 (six) hours as needed for wheezing or shortness of breath.   amLODipine (NORVASC) 10 MG tablet Take 1 tablet (10 mg total) by mouth daily.   atorvastatin (LIPITOR) 40 MG tablet Take 1 tablet (40 mg total) by mouth daily. PLEASE KEEP UPCOMING SCHEDULED APPOINTMENT   clopidogrel (PLAVIX) 75 MG  tablet Take 1 tablet (75 mg total) by mouth daily.   cyclobenzaprine (FLEXERIL) 5 MG tablet TAKE 1-2 TABLETS (5-10 MG TOTAL) BY MOUTH AT BEDTIME AS NEEDED FOR MUSCLE SPASMS.   famotidine (PEPCID) 40 MG tablet TAKE 1 TABLET BY MOUTH TWICE A DAY   hyoscyamine (LEVBID) 0.375 MG 12 hr tablet TAKE 1 TABLET (0.375 MG TOTAL) BY MOUTH 2 (TWO) TIMES DAILY. (NOTCOVERED)   isosorbide mononitrate (IMDUR) 60 MG 24 hr tablet TAKE 1 TABLET BY MOUTH EVERY DAY   lidocaine (LIDODERM) 5 % Place 1 patch onto the skin daily as needed (pain). Remove patch after 12 hours.   meloxicam (MOBIC) 7.5 MG tablet Take 7.5 mg by mouth daily.   mesalamine (LIALDA) 1.2 g EC tablet TAKE 2 TABLETS BY MOUTH DAILY WITH BREAKFAST.   metoprolol succinate (TOPROL-XL) 100 MG 24 hr tablet TAKE 1 TABLET BY MOUTH DAILY. TAKE WITH OR IMMEDIATELY FOLLOWING A MEAL.   nitroGLYCERIN (NITROSTAT) 0.4 MG SL tablet TAKE 1 TABLET BY MOUTH SUBLINGUALLY EVERY 5 MINS AS NEEDED FOR CHEST PAIN. IF >1 NEEDED, CALL 911   pantoprazole (PROTONIX) 40 MG tablet Take 1 tablet (40 mg total) by mouth daily.   Potassium Chloride ER 20 MEQ TBCR Take 1 tablet by mouth daily.   RESTASIS 0.05 % ophthalmic emulsion Place 1 drop into both eyes 2 (two) times daily.    tamsulosin  (FLOMAX) 0.4 MG CAPS capsule Take 0.4 mg by mouth daily.   telmisartan-hydrochlorothiazide (MICARDIS HCT) 80-12.5 MG tablet Take 1 tablet by mouth daily. In am d/c 40-12.5 mg dose   [DISCONTINUED] potassium chloride 20 MEQ TBCR Take 20 mEq by mouth daily for 7 days.    Allergies:   Cymbalta [duloxetine hcl]   Social History   Socioeconomic History   Marital status: Married    Spouse name: Not on file   Number of children: 2   Years of education: Not on file   Highest education level: Not on file  Occupational History   Not on file  Tobacco Use   Smoking status: Former    Packs/day: 2.00    Years: 40.00    Pack years: 80.00    Types: Cigarettes    Quit date: 04/20/1998    Years since quitting: 22.5   Smokeless tobacco: Never   Tobacco comments:    quit in 2000s   Vaping Use   Vaping Use: Never used  Substance and Sexual Activity   Alcohol use: Yes    Comment: occasional   Drug use: Not Currently   Sexual activity: Yes  Other Topics Concern   Not on file  Social History Narrative   Moved from [place] in 10/2018   2 sons in 63s as of 11/24/2018    Married wife is DPR Pamala Hurry x 45 years as of 04/2019   Social Determinants of Radio broadcast assistant Strain: Low Risk    Difficulty of Paying Living Expenses: Not hard at all  Food Insecurity: No Food Insecurity   Worried About Charity fundraiser in the Last Year: Never true   Arboriculturist in the Last Year: Never true  Transportation Needs: No Transportation Needs   Lack of Transportation (Medical): No   Lack of Transportation (Non-Medical): No  Physical Activity: Not on file  Stress: No Stress Concern Present   Feeling of Stress : Not at all  Social Connections: Unknown   Frequency of Communication with Friends and Family: Not on file   Frequency  of Social Gatherings with Friends and Family: Not on file   Attends Religious Services: Not on file   Active Member of Clubs or Organizations: Not on file    Attends Archivist Meetings: Not on file   Marital Status: Married     Family History:  The patient's family history includes Breast cancer in his mother; Diabetes in his brother and sister; Heart disease in his mother; Stroke in his mother. There is no history of Colon cancer, Esophageal cancer, Inflammatory bowel disease, Liver disease, Pancreatic cancer, Rectal cancer, or Stomach cancer.  ROS:   Review of Systems  Constitutional:  Negative for chills, diaphoresis, fever, malaise/fatigue and weight loss.  HENT:  Negative for congestion.   Eyes:  Negative for discharge and redness.  Respiratory:  Positive for shortness of breath. Negative for cough, sputum production and wheezing.   Cardiovascular:  Negative for chest pain, palpitations, orthopnea, claudication, leg swelling and PND.  Gastrointestinal:  Negative for abdominal pain, blood in stool, heartburn, melena, nausea and vomiting.  Musculoskeletal:  Negative for falls and myalgias.  Skin:  Negative for rash.  Neurological:  Negative for dizziness, tingling, tremors, sensory change, speech change, focal weakness, loss of consciousness and weakness.  Endo/Heme/Allergies:  Does not bruise/bleed easily.  Psychiatric/Behavioral:  Negative for substance abuse. The patient is not nervous/anxious.   All other systems reviewed and are negative.   EKGs/Labs/Other Studies Reviewed:    Studies reviewed were summarized above. The additional studies were reviewed today:  2D echo 05/14/2020: 1. Left ventricular ejection fraction, by estimation, is 55 to 60%. The  left ventricle has normal function. The left ventricle has no regional  wall motion abnormalities. There is mild left ventricular hypertrophy.  Left ventricular diastolic parameters  are consistent with Grade I diastolic dysfunction (impaired relaxation).  The average left ventricular global longitudinal strain is -16.3 %.   2. Right ventricular systolic function is  normal. The right ventricular  size is normal.   3. The mitral valve is grossly normal. Trivial mitral valve  regurgitation. No evidence of mitral stenosis.   4. The aortic valve was not well visualized. Aortic valve regurgitation  is not visualized. No aortic stenosis is present.   5. The inferior vena cava is normal in size with greater than 50%  respiratory variability, suggesting right atrial pressure of 3 mmHg. __________  [facility] 03/21/2020: Conclusions: Severe single-vessel coronary artery disease with up to 75% in-stent restenosis of the mid/distal RCA (FFR 0.70), as well as 60-70% ostial stenoses of rPDA and rPL1. Mild to moderate, non-critical left coronary artery disease.  The most severe lesion is a small to moderate caliber D1 branch that is jailed by LAD stent with up to 70% stenosis. Normal left ventricular systolic function with mildly elevated filling pressure. Successful PTCA of mid/distal RCA with reduction in stenosis from 75% to 20% with TIMI-3 flow.  Stent placement not attempted due to multiple layers of stent already present and incomplete expansion of 3.5 mm non-compliant balloon at 22 atm.   Recommendations: Continue aggressive secondary prevention. Indefinite dual antiplatelet therapy with aspirin and clopidogrel; discontinuation of aspirin could be considered after 6 months of therapy. Anticipate same-day discharge if no post-catheterization complication occur. __________  [facility] 03/07/2019: Normal pharmacologic myocardial perfusion stress test without significant ischemia or scar. The left ventricular ejection fraction is normal by visual estimation and Siemens calculation (56%). Dense coronary artery calcifications and/or prior coronary stents are noted on the attenuation correction CT. This is a  low risk study. __________  2D echo 03/02/2019: 1. Left ventricular ejection fraction, by visual estimation, is 60 to  65%. The left ventricle has normal  function. There is no left ventricular  hypertrophy.   2. Left ventricular diastolic parameters are consistent with Grade I  diastolic dysfunction (impaired relaxation).   3. Global right ventricle has normal systolic function.The right  ventricular size is normal. No increase in right ventricular wall  thickness.   4. Left atrial size was normal.   5. Normal pulmonary artery systolic pressure. __________  [facility] 02/25/2012 [facility]): Patent stents in the proximal and mid RCA, 40% mid RCA stenosis, normal left heart pressures, normal LV systolic function   EKG:  EKG is ordered today.  The EKG ordered today demonstrates NSR, 71 bpm,, prior inferior infarct, no acute ST-T changes  Recent Labs: 01/30/2020: TSH 0.72 08/06/2020: ALT 28; Magnesium 1.9 10/02/2020: B Natriuretic Peptide 11.9 10/31/2020: BUN 21; Creatinine, Ser 0.75; Hemoglobin 11.6; Platelets 181; Potassium 3.6; Sodium 138  Recent Lipid Panel    Component Value Date/Time   CHOL 126 01/30/2020 0803   TRIG 113.0 01/30/2020 0803   HDL 58.80 01/30/2020 0803   CHOLHDL 2 01/30/2020 0803   VLDL 22.6 01/30/2020 0803   LDLCALC 45 01/30/2020 0803    PHYSICAL EXAM:    VS:  BP 120/68 (BP Location: Left Arm, Patient Position: Sitting, Cuff Size: Normal)   Pulse 71   Ht 5' 7"  (1.702 m)   Wt 175 lb (79.4 kg)   SpO2 98%   BMI 27.41 kg/m   BMI: Body mass index is 27.41 kg/m.  Physical Exam Vitals reviewed.  Constitutional:      Appearance: He is well-developed.  HENT:     Head: Normocephalic and atraumatic.  Eyes:     General:        Right eye: No discharge.        Left eye: No discharge.  Neck:     Vascular: No JVD.  Cardiovascular:     Rate and Rhythm: Normal rate and regular rhythm.     Pulses:          Posterior tibial pulses are 2+ on the right side and 2+ on the left side.     Heart sounds: Normal heart sounds, S1 normal and S2 normal. Heart sounds not distant. No midsystolic click and no  opening snap. No murmur heard.   No friction rub.  Pulmonary:     Effort: Pulmonary effort is normal. No respiratory distress.     Breath sounds: Normal breath sounds. No decreased breath sounds, wheezing or rales.  Chest:     Chest wall: No tenderness.  Abdominal:     General: There is no distension.     Palpations: Abdomen is soft.     Tenderness: There is no abdominal tenderness.  Musculoskeletal:     Cervical back: Normal range of motion.     Right lower leg: No edema.     Left lower leg: No edema.  Skin:    General: Skin is warm and dry.     Nails: There is no clubbing.  Neurological:     Mental Status: He is alert and oriented to person, place, and time.  Psychiatric:        Speech: Speech normal.        Behavior: Behavior normal.        Thought Content: Thought content normal.        Judgment: Judgment normal.  Wt Readings from Last 3 Encounters:  11/18/20 175 lb (79.4 kg)  11/05/20 170 lb (77.1 kg)  10/31/20 165 lb (74.8 kg)     ASSESSMENT & PLAN:   CAD involving the native coronary arteries with stable angina: No recent episodes of chest pain or symptoms concerning for angina.  Given ongoing dyspnea, despite inhaler use for possible underlying COPD we will plan to proceed with a Lexiscan MPI to evaluate for high risk ischemia.  Recent echo earlier this year demonstrated a preserved LV systolic function with no regional wall motion abnormalities or significant valvular pathology.  Otherwise, he will continue secondary prevention and current medications including aspirin, clopidogrel, amlodipine, atorvastatin, isosorbide mononitrate, metoprolol succinate, and as needed SL NTG.  Informed decision-making was discussed with regards to [facility] MPI.  Exertional dyspnea with probable COPD: Symptoms of exertional dyspnea are chronic and unchanged.  We will pursue noninvasive ischemic testing as outlined above.  If this is reassuring no further ischemic testing would be  indicated at this time and he should continue to follow-up with pulmonology.  HTN: Blood pressure is well controlled in the office today.  Continue current medical therapy.  HLD: LDL 45 in 01/2020.  He remains on atorvastatin.  PAD with stable claudication: No symptoms of lifestyle limiting claudication.  He remains on aspirin, clopidogrel, and atorvastatin.  Follow-up with vascular surgery as directed.   Disposition: F/u with Dr. Saunders Revel or an APP in 3 months.   Medication Adjustments/Labs and Tests Ordered: Current medicines are reviewed at length with the patient today.  Concerns regarding medicines are outlined above. Medication changes, Labs and Tests ordered today are summarized above and listed in the Patient Instructions accessible in Encounters.   Signed, Christell Faith, PA-C 11/18/2020 12:46 PM     [address], [place] [address] [phone]

## 2020-11-19 ENCOUNTER — Encounter: Payer: Self-pay | Admitting: Gastroenterology

## 2020-11-25 ENCOUNTER — Other Ambulatory Visit: Payer: Self-pay

## 2020-11-25 ENCOUNTER — Ambulatory Visit
Admission: RE | Admit: 2020-11-25 | Discharge: 2020-11-25 | Disposition: A | Discharge status: Home / Self Care | Payer: Medicare Other | Source: Ambulatory Visit | Attending: Physician Assistant | Admitting: Physician Assistant

## 2020-11-25 DIAGNOSIS — R0600 Dyspnea, unspecified: Secondary | ICD-10-CM | POA: Insufficient documentation

## 2020-11-25 DIAGNOSIS — R0609 Other forms of dyspnea: Secondary | ICD-10-CM

## 2020-11-25 LAB — NM MYOCAR MULTI W/SPECT W/WALL MOTION / EF
LV dias vol: 74 mL (ref 62–150)
LV sys vol: 31 mL
MPHR: 148 {beats}/min
Peak HR: 80 {beats}/min
Percent HR: 54 %
Rest HR: 69 {beats}/min
SDS: 0
SRS: 1
SSS: 0
TID: 1

## 2020-11-25 MED ORDER — REGADENOSON 0.4 MG/5ML IV SOLN
0.4000 mg | Freq: Once | INTRAVENOUS | Status: AC
  Administered 2020-11-25: 0.4 mg via INTRAVENOUS
  Filled 2020-11-25: qty 5

## 2020-11-25 MED ORDER — TECHNETIUM TC 99M TETROFOSMIN IV KIT
10.0000 | PACK | Freq: Once | INTRAVENOUS | Status: AC | PRN
  Administered 2020-11-25: 9.3 via INTRAVENOUS

## 2020-11-25 MED ORDER — TECHNETIUM TC 99M TETROFOSMIN IV KIT
31.0500 | PACK | Freq: Once | INTRAVENOUS | Status: AC | PRN
  Administered 2020-11-25: 31.05 via INTRAVENOUS

## 2020-11-27 ENCOUNTER — Other Ambulatory Visit: Payer: Self-pay | Admitting: *Deleted

## 2020-11-27 MED ORDER — CLOPIDOGREL BISULFATE 75 MG PO TABS: 75.0000 mg | ORAL_TABLET | Freq: Every day | ORAL | 3 refills | Status: DC

## 2020-11-27 NOTE — Addendum Note (Signed)
Addended by: Raelene Bott, Rigley Niess L on: 11/27/2020 07:58 AM   Modules accepted: Orders

## 2021-01-31 ENCOUNTER — Other Ambulatory Visit: Payer: Self-pay | Admitting: Gastroenterology

## 2021-02-01 ENCOUNTER — Other Ambulatory Visit: Payer: Self-pay | Admitting: Internal Medicine

## 2021-02-06 ENCOUNTER — Other Ambulatory Visit: Payer: Self-pay

## 2021-02-06 ENCOUNTER — Ambulatory Visit (INDEPENDENT_AMBULATORY_CARE_PROVIDER_SITE_OTHER): Payer: Medicare Other

## 2021-02-06 ENCOUNTER — Ambulatory Visit (INDEPENDENT_AMBULATORY_CARE_PROVIDER_SITE_OTHER): Payer: Medicare Other | Admitting: Internal Medicine

## 2021-02-06 ENCOUNTER — Encounter: Payer: Self-pay | Admitting: Internal Medicine

## 2021-02-06 VITALS — BP 124/70 | HR 88 | Temp 98.7°F | Ht 67.0 in | Wt 182.2 lb

## 2021-02-06 DIAGNOSIS — R32 Unspecified urinary incontinence: Secondary | ICD-10-CM

## 2021-02-06 DIAGNOSIS — E049 Nontoxic goiter, unspecified: Secondary | ICD-10-CM

## 2021-02-06 DIAGNOSIS — Z043 Encounter for examination and observation following other accident: Secondary | ICD-10-CM | POA: Diagnosis not present

## 2021-02-06 DIAGNOSIS — M25531 Pain in right wrist: Secondary | ICD-10-CM

## 2021-02-06 DIAGNOSIS — R002 Palpitations: Secondary | ICD-10-CM

## 2021-02-06 DIAGNOSIS — Z8546 Personal history of malignant neoplasm of prostate: Secondary | ICD-10-CM

## 2021-02-06 DIAGNOSIS — M25532 Pain in left wrist: Secondary | ICD-10-CM

## 2021-02-06 DIAGNOSIS — I10 Essential (primary) hypertension: Secondary | ICD-10-CM

## 2021-02-06 NOTE — Progress Notes (Signed)
Chief Complaint  Patient presents with   Follow-up   Wrist Pain    Bilateral wrist pain after a fall around a month ago. Pain rated 10/10 in both wrist as Patient put both hands down to catch his fall.    F/u  1. B/l wrist pain 1-2 months ago he feel in yard hands outstretched on concrete slab forward and wrists were swollen having 10/10 pain with ROM and hand and wrist swelling had to open jars. Pain with flexion and extension esp in inner wrists b/l but R>L and has trouble pushing up  Of note he has had 2 falls  2. Urinary incontinence and leakage cc'ed Dr Jeffie Pollock [facility] to follow up s/p prostate cancer and h/o radiation  Flomax 0.4 Is not helping 3. Palpitations will have pt f/u with Christell Faith upcoming disc could be due to pain but may need zio 4. Htn improved on norvasc 10 mg qd imdur 60 toprol 100 xl, micardis 80-12.5 mg qd    Review of Systems  Cardiovascular:  Positive for palpitations. Negative for chest pain.  Genitourinary:        +incontinence  Musculoskeletal:  Positive for falls and joint pain.  Psychiatric/Behavioral:  Negative for memory loss.   Past Medical History:  Diagnosis Date   Anemia    Arthritis    BPH (benign prostatic hyperplasia)    CAD (coronary artery disease)    x 7 cardiac stents   Chronic back pain    L4/5 with chronic right leg pain    COVID-19    10/20/20   Floater, vitreous, left    Hx of hepatitis C    treated with Harvoni in 2014   Hyperlipidemia    Hypertension    Neuromuscular disorder ([place])    nerve damage in back   Neuropathy    Prostate cancer ([place])    PVD (peripheral vascular disease) ([place])    2 stents right leg and 1 in left    Ulcerative colitis ([place])    with diarrhea   Past Surgical History:  Procedure Laterality Date   back surgery     x 2 lumbar last 09/2016 in [place], BILATERAL     COLONOSCOPY     last 09/2018 in [place] N/A 03/21/2020   Procedure: [place];  Surgeon: Nelva Bush, MD;  Location: [facility];  Service: Cardiovascular;  Laterality: N/A;   HERNIA REPAIR     right    INTRAVASCULAR PRESSURE WIRE/FFR STUDY N/A 03/21/2020   Procedure: INTRAVASCULAR PRESSURE WIRE/FFR STUDY;  Surgeon: Nelva Bush, MD;  Location: [facility];  Service: Cardiovascular;  Laterality: N/A;   LEFT HEART CATH AND CORONARY ANGIOGRAPHY N/A 03/21/2020   Procedure: LEFT HEART CATH AND CORONARY ANGIOGRAPHY;  Surgeon: Nelva Bush, MD;  Location: [facility];  Service: Cardiovascular;  Laterality: N/A;   PENILE PROSTHESIS IMPLANT     PERCUTANEOUS CORONARY STENT INTERVENTION (PCI-S)     x 7-8 heart   PERIPHERAL ARTERIAL STENT GRAFT     PROSTATE BIOPSY     pvd     with stenting x 2 right leg below knee and 1 left thigh    SHOULDER ARTHROSCOPY Bilateral    VITRECTOMY     right eye   Family History  Problem Relation Age of Onset   Heart disease Mother    Stroke Mother  Breast cancer Mother    Diabetes Sister    Diabetes Brother    Colon cancer Neg Hx    Esophageal cancer Neg Hx    Inflammatory bowel disease Neg Hx    Liver disease Neg Hx    Pancreatic cancer Neg Hx    Rectal cancer Neg Hx    Stomach cancer Neg Hx    Social History   Socioeconomic History   Marital status: Married    Spouse name: Not on file   Number of children: 2   Years of education: Not on file   Highest education level: Not on file  Occupational History   Not on file  Tobacco Use   Smoking status: Former    Packs/day: 2.00    Years: 40.00    Pack years: 80.00    Types: Cigarettes    Quit date: 04/20/1998    Years since quitting: 22.8   Smokeless tobacco: Never   Tobacco comments:    quit in 2000s   Vaping Use   Vaping Use: Never used  Substance and Sexual Activity   Alcohol use: Yes    Comment: occasional   Drug use: Not Currently   Sexual activity: Yes  Other  Topics Concern   Not on file  Social History Narrative   Moved from [place] in 10/2018   2 sons in 67s as of 11/24/2018    Married wife is DPR Pamala Hurry x 45 years as of 04/2019   Social Determinants of Radio broadcast assistant Strain: Low Risk    Difficulty of Paying Living Expenses: Not hard at all  Food Insecurity: No Food Insecurity   Worried About Charity fundraiser in the Last Year: Never true   Arboriculturist in the Last Year: Never true  Transportation Needs: No Transportation Needs   Lack of Transportation (Medical): No   Lack of Transportation (Non-Medical): No  Physical Activity: Not on file  Stress: No Stress Concern Present   Feeling of Stress : Not at all  Social Connections: Unknown   Frequency of Communication with Friends and Family: Not on file   Frequency of Social Gatherings with Friends and Family: Not on file   Attends Religious Services: Not on file   Active Member of Clubs or Organizations: Not on file   Attends Archivist Meetings: Not on file   Marital Status: Married  Human resources officer Violence: Not At Risk   Fear of Current or Ex-Partner: No   Emotionally Abused: No   Physically Abused: No   Sexually Abused: No   Current Meds  Medication Sig   albuterol (VENTOLIN HFA) 108 (90 Base) MCG/ACT inhaler Inhale 1-2 puffs into the lungs every 6 (six) hours as needed for wheezing or shortness of breath.   amLODipine (NORVASC) 10 MG tablet Take 1 tablet (10 mg total) by mouth daily.   atorvastatin (LIPITOR) 40 MG tablet TAKE 1 TABLET (40 MG TOTAL) BY MOUTH DAILY. PLEASE KEEP UPCOMING SCHEDULED APPOINTMENT   clopidogrel (PLAVIX) 75 MG tablet Take 1 tablet (75 mg total) by mouth daily.   cyclobenzaprine (FLEXERIL) 5 MG tablet TAKE 1-2 TABLETS (5-10 MG TOTAL) BY MOUTH AT BEDTIME AS NEEDED FOR MUSCLE SPASMS.   famotidine (PEPCID) 40 MG tablet TAKE 1 TABLET BY MOUTH TWICE A DAY   hyoscyamine (LEVBID) 0.375 MG 12 hr tablet TAKE 1 TABLET (0.375 MG  TOTAL) BY MOUTH 2 (TWO) TIMES DAILY. (NOTCOVERED)   isosorbide mononitrate (IMDUR) 60 MG 24  hr tablet TAKE 1 TABLET BY MOUTH EVERY DAY   lidocaine (LIDODERM) 5 % Place 1 patch onto the skin daily as needed (pain). Remove patch after 12 hours.   mesalamine (LIALDA) 1.2 g EC tablet TAKE 2 TABLETS BY MOUTH DAILY WITH BREAKFAST.   metoprolol succinate (TOPROL-XL) 100 MG 24 hr tablet TAKE 1 TABLET BY MOUTH DAILY. TAKE WITH OR IMMEDIATELY FOLLOWING A MEAL.   nitroGLYCERIN (NITROSTAT) 0.4 MG SL tablet TAKE 1 TABLET BY MOUTH SUBLINGUALLY EVERY 5 MINS AS NEEDED FOR CHEST PAIN. IF >1 NEEDED, CALL 911   pantoprazole (PROTONIX) 40 MG tablet Take 1 tablet (40 mg total) by mouth daily.   Potassium Chloride ER 20 MEQ TBCR Take 1 tablet by mouth daily.   RESTASIS 0.05 % ophthalmic emulsion Place 1 drop into both eyes 2 (two) times daily.    telmisartan-hydrochlorothiazide (MICARDIS HCT) 80-12.5 MG tablet Take 1 tablet by mouth daily. In am d/c 40-12.5 mg dose   Allergies  Allergen Reactions   Cymbalta [Duloxetine Hcl] Other (See Comments)    drowsiness   Recent Results (from the past 2160 hour(s))  NM Myocar Multi W/Spect W/Wall Motion / EF     Status: None   Collection Time: 11/25/20 11:23 AM  Result Value Ref Range   Rest HR 69 bpm   Rest BP 132/70 mmHg   Percent HR 54 %   Peak HR 80 bpm   Peak BP 132/70 mmHg   MPHR 148 bpm   SSS 0    SRS 1    SDS 0    TID 1.00    LV sys vol 31 mL   LV dias vol 74 62 - 150 mL   Objective  Body mass index is 28.54 kg/m. Wt Readings from Last 3 Encounters:  02/06/21 182 lb 3.2 oz (82.6 kg)  11/18/20 175 lb (79.4 kg)  11/05/20 170 lb (77.1 kg)   Temp Readings from Last 3 Encounters:  02/06/21 98.7 F (37.1 C) (Oral)  10/31/20 99.1 F (37.3 C) (Oral)  10/16/20 98.6 F (37 C) (Oral)   BP Readings from Last 3 Encounters:  02/06/21 124/70  11/18/20 120/68  11/05/20 (!) 172/71   Pulse Readings from Last 3 Encounters:  02/06/21 88  11/18/20 71   11/05/20 71    Physical Exam Vitals and nursing note reviewed.  Constitutional:      Appearance: Normal appearance. He is well-developed and well-groomed.  HENT:     Head: Normocephalic and atraumatic.  Eyes:     Conjunctiva/sclera: Conjunctivae normal.     Pupils: Pupils are equal, round, and reactive to light.  Cardiovascular:     Rate and Rhythm: Normal rate and regular rhythm.     Heart sounds: Normal heart sounds. No murmur heard. Pulmonary:     Effort: Pulmonary effort is normal.     Breath sounds: Normal breath sounds.  Musculoskeletal:     Right wrist: Swelling, tenderness, bony tenderness and snuff box tenderness present. Decreased range of motion.     Left wrist: Tenderness and bony tenderness present. Decreased range of motion.  Skin:    General: Skin is warm and dry.  Neurological:     General: No focal deficit present.     Mental Status: He is alert and oriented to person, place, and time. Mental status is at baseline.     Gait: Gait normal.  Psychiatric:        Attention and Perception: Attention and perception normal.  Mood and Affect: Mood and affect normal.        Speech: Speech normal.        Behavior: Behavior normal. Behavior is cooperative.        Thought Content: Thought content normal.        Cognition and Memory: Cognition and memory normal.        Judgment: Judgment normal.    Assessment  Plan  Pain in both wrists R>L s/p fall 1-2 months ago on concrete- Plan: DG Wrist Complete Left, DG Wrist Complete Right, Ambulatory referral to Orthopedic [facility] EXAM: RIGHT WRIST - COMPLETE 4 VIEW   COMPARISON:  None.   FINDINGS: Soft tissue calcifications are seen adjacent to the scaphoid and capitate. No other evidence of fracture. No dislocation. Vascular calcifications are noted.   IMPRESSION: Soft tissue calcifications adjacent to the scaphoid and lunate within the scapholunate interval are likely sequela of age indeterminate  trauma. Recommend clinical correlation.     Electronically Signed   By: Dorise Bullion III M.D.   On: 02/07/2021 15:09 LEFT WRIST - COMPLETE 3+ VIEW   COMPARISON:  None.   FINDINGS: Vascular calcifications are noted. The scapholunate interval measures 4 mm proximally but is more narrow distally. Cysts are seen within the capitate. No acute fracture or dislocation. No other abnormalities.   IMPRESSION: No acute fracture or dislocation. The scapholunate interval on the right is widened proximally but not distally. This raises the possibility of a scapholunate ligament injury, age indeterminate.     Electronically Signed   By: Dorise Bullion III M.D.   On: 02/07/2021 15:07  Htn improved on meds norvasc 10, imdue 60, metoprolol xl 100 mg qd, micardis hct 80-12.5 mg qd   Palpitations F/u ryan Dunn could be 2/2 pain may need zio   Urinary incontinence/leakage s/p tx prostate cancer and radiation flomax did not work  F/u urology Dr. Jeffie Pollock   HM Get copy of records in future from Dr. Lyman Speller in [place] (I.e vaccines utd flu shot 02/05/21 covid vx 4/4 pfizer    -vaccines ROI sent prev per pt had shingles vaccine, tdap, and pna shots [facility] [place] [facility] sent prev    colonoscopy/EGD repeat leb GI utd and UC in remission    Colonoscopy per pt last 09/2018 in [place] per pt  Had 05/2019 and EGD with IH/H, polyps negative path nodule and gastritis EGD rec repeat EUS and EGD in 3 months per GI  Colonoscopy 05/30/19 as of 08/06/20 declines further    Former smoker quit in 2000s    PCP Dr. Lyman Speller in [place] still need copy of prior vaccines      Recurrent prostate cancer tx tbd Dr. Karsten [facility] and Dr. Tammi Klippel rad on s/p rad x 2 sessions stopped 2 weeks from 08/04/19   Prior specialist: Dr. Stephanie [facility] appt 06/07/19 Rx percocet 5-325 1/2 po bid prn lyrica 50 mg bid rtc in 3 weeks     Provider: Dr. Olivia Mackie  [name]

## 2021-02-06 NOTE — Patient Instructions (Addendum)
Lidocaine pain patch for wrists over the counter   Wrist and Forearm Exercises Ask your health care provider which exercises are safe for you. Do exercises exactly as told by your health care provider and adjust them as directed. It is normal to feel mild stretching, pulling, tightness, or discomfort as you do these exercises. Stop right away if you feel sudden pain or your pain gets worse. Do not begin these exercises until told by your health care provider. Range-of-motion exercises These exercises warm up your muscles and joints and improve the movement and flexibility of your injured wrist and forearm. These exercises also help to relieve pain, numbness, and tingling. These exercises are done using the muscles in your injured wrist and forearm. Wrist flexion Bend your left / right elbow to a 90-degree angle (right angle) with your palm facing the floor. Bend your wrist so that your fingers point toward the floor (flexion). Hold this position for __________ seconds. Slowly return to the starting position. Repeat __________ times. Complete this exercise __________ times a day. Wrist extension Bend your left / right elbow to a 90-degree angle (right angle) with your palm facing the floor. Bend your wrist so that your fingers point toward the ceiling (extension). Hold this position for __________ seconds. Slowly return to the starting position. Repeat __________ times. Complete this exercise __________ times a day. Ulnar deviation Bend your left / right elbow to a 90-degree angle (right angle), and rest your forearm on a table with your palm facing down. Keeping your hand flat on the table, bend your left /right wrist toward your small finger (pinkie). This is ulnar deviation. Hold this position for __________ seconds. Slowly return to the starting position. Repeat __________ times. Complete this exercise __________ times a day. Radial deviation Bend your left / right elbow to a 90-degree  angle (right angle), and rest your forearm on a table with your palm facing down. Keeping your hand flat on the table, bend your left /right wrist toward your thumb. This is radial deviation. Hold this position for __________ seconds. Slowly return to the starting position. Repeat __________ times. Complete this exercise __________ times a day. Forearm rotation, supination  Sit with your left / right elbow bent to a 90-degree angle (right angle). Position your forearm so that the thumb is facing the ceiling (neutral position). Turn (rotate) your palm up toward the ceiling (supination), stopping when you feel a gentle stretch. Hold this position for __________ seconds. Slowly return to the starting position. Repeat __________ times. Complete this exercise __________ times a day. Forearm rotation, pronation  Sit with your left / right elbow bent to a 90-degree angle (right angle). Position your forearm so that the thumb is facing the ceiling (neutral position). Rotate your palm down toward the floor (pronation), stopping when you feel a gentle stretch. Hold this position for __________ seconds. Slowly return to the starting position. Repeat __________ times. Complete this exercise __________ times a day. Stretching These exercises warm up your muscles and joints and improve the movement and flexibility of your injured wrist and forearm. These exercises also help to relieve pain, numbness, and tingling. These exercises are done using your healthy wrist and forearm to help stretch the muscles in your injured wrist and forearm. Wrist flexion  Extend your left / right arm in front of you, and turn your palm down toward the floor. If told by your health care provider, bend your left / right elbow to a 90-degree angle (right angle)  at your side. Using your uninjured hand, gently press over the back of your left / right hand to bend your wrist and fingers toward the floor (flexion). Go as far as you  can to feel a stretch without causing pain. Hold this position for __________ seconds. Slowly return to the starting position. Repeat __________ times. Complete this exercise __________ times a day. Wrist extension  Extend your left / right arm in front of you and turn your palm up toward the ceiling. If told by your health care provider, bend your left / right elbow to a 90-degree angle (right angle) at your side. Using your uninjured hand, gently press over the palm of your left / right hand to bend your wrist and fingers toward the floor (extension). Go as far as you can to feel a stretch without causing pain. Hold this position for __________ seconds. Slowly return to the starting position. Repeat __________ times. Complete this exercise __________ times a day. Forearm rotation, supination Sit with your left / right elbow bent to a 90-degree angle (right angle). Position your forearm so that the thumb is facing the ceiling (neutral position). Rotate your palm up toward the ceiling as far as you can on your own (supination). Then, use your uninjured hand to help turn your forearm more, stopping when you feel a gentle stretch. Hold this position for __________ seconds. Slowly return to the starting position. Repeat __________ times. Complete this exercise __________ times a day. Forearm rotation, pronation Sit with your left / right elbow bent to a 90-degree angle (right angle). Position your forearm so that the thumb is facing the ceiling (neutral position). Rotate your palm down toward the floor as far as you can on your own (pronation). Then, use your uninjured hand to help turn your forearm more, stopping when you feel a gentle stretch. Hold this position for __________ seconds. Slowly return to the starting position. Repeat __________ times. Complete this exercise __________ times a day. Strengthening exercises These exercises build strength and endurance in your wrist and forearm.  Endurance is the ability to use your muscles for a long time, even after they get tired. Wrist flexion  Sit with your left / right forearm supported on a table or other surface. Bend your elbow to a 90-degree angle (right angle), and rest your hand palm-up over the edge of the table. Hold a __________ weight in your left / right hand. Or, hold an exercise band or tube in both hands, keeping your hands at the same level and hip distance apart. There should be a slight tension in the exercise band or tube. Slowly curl your hand up toward the ceiling (flexion). Hold this position for __________ seconds. Slowly lower your hand back to the starting position. Repeat __________ times. Complete this exercise __________ times a day. Wrist extension  Sit with your left / right forearm supported on a table or other surface. Bend your elbow to a 90-degree angle (right angle), and rest your hand palm-down over the edge of the table. Hold a __________ weight in your left / right hand. Or, hold an exercise band or tube in both hands, keeping your hands at the same level and hip distance apart. There should be a slight tension in the exercise band or tube. Slowly curl your hand up toward the ceiling (extension). Hold this position for __________ seconds. Slowly lower your hand back to the starting position. Repeat __________ times. Complete this exercise __________ times a day. Forearm rotation,  supination  Sit with your left / right forearm supported on a table or other surface. Bend your elbow to a 90-degree angle (right angle). Position your forearm so that your thumb is facing the ceiling (neutral position) and your hand is resting over the edge of the table. Hold a hammer in your left / right hand. This exercise will be easier if you hold the hammer near the head of the hammer. This exercise will be harder if you hold the hammer near the end of the handle. Without moving your elbow, slowly rotate your  palm up toward the ceiling (supination). Hold this position for __________ seconds. Slowly return to the starting position. Repeat __________ times. Complete this exercise __________ times a day. Forearm rotation, pronation  Sit with your left / right forearm supported on a table or other surface. Bend your elbow to a 90-degree angle (right angle). Position your forearm so that the thumb is facing the ceiling (neutral position), with your hand resting over the edge of the table. Hold a hammer in your left / right hand. This exercise will be easier if you hold the hammer near the head of the hammer. This exercise will be harder if you hold the hammer near the end of the handle. Without moving your elbow, slowly rotate your palm down toward the floor (pronation). Hold this position for __________ seconds. Slowly return to the starting position. Repeat __________ times. Complete this exercise __________ times a day. Grip strengthening  Grasp a stress ball or other ball in the middle of your left / right hand. Start with your elbow bent to a 90-degree angle (right angle). Slowly increase the pressure, squeezing the ball as hard as you can without causing pain. Think of bringing the tips of your fingers into the middle of your palm. All of your finger joints should bend when doing this exercise. To make this exercise harder, gradually try to straighten your elbow in front of you, until you can do the exercise with your elbow fully straight. Hold your squeeze for __________ seconds, then relax. If instructed by your health care provider, do this exercise: With your forearm positioned so that the thumb is facing the ceiling (neutral position). With your forearm turned palm down. With your forearm turned palm up. Repeat __________ times. Complete this exercise __________ times a day. This information is not intended to replace advice given to you by your health care provider. Make sure you discuss  any questions you have with your health care provider. Document Revised: 05/26/2018 Document Reviewed: 05/26/2018 Elsevier Patient Education  [place].  Urinary Incontinence Urinary incontinence refers to a condition in which a person is unable to control where and when to pass urine. A person with this condition will urinate when he or she does not mean to (involuntarily). What are the causes? This condition may be caused by: Medicines. Infections. Constipation. Overactive bladder muscles. Weak bladder muscles. Weak pelvic floor muscles. These muscles provide support for the bladder, intestine, and, in women, the uterus. Enlarged prostate in men. The prostate is a gland near the bladder. When it gets too big, it can pinch the urethra. With the urethra blocked, the bladder can weaken and lose the ability to empty properly. Surgery. Emotional factors, such as anxiety, stress, or post-traumatic stress disorder (PTSD). Pelvic organ prolapse. This happens in women when organs shift out of place and into the vagina. This shift can prevent the bladder and urethra from working properly. What increases the  risk? The following factors may make you more likely to develop this condition: Older age. Obesity and physical inactivity. Pregnancy and childbirth. Menopause. Diseases that affect the nerves or spinal cord (neurological diseases). Long-term (chronic) coughing. This can increase pressure on the bladder and pelvic floor muscles. What are the signs or symptoms? Symptoms may vary depending on the type of urinary incontinence you have. They include: A sudden urge to urinate, but passing urine involuntarily before you can get to a bathroom (urge incontinence). Suddenly passing urine with any activity that forces urine to pass, such as coughing, laughing, exercise, or sneezing (stress incontinence). Needing to urinate often, but urinating only a small amount, or constantly dribbling  urine (overflow incontinence). Urinating because you cannot get to the bathroom in time due to a physical disability, such as arthritis or injury, or communication and thinking problems, such as Alzheimer disease (functional incontinence). How is this diagnosed? This condition may be diagnosed based on: Your medical history. A physical exam. Tests, such as: Urine tests. X-rays of your kidney and bladder. Ultrasound. CT scan. Cystoscopy. In this procedure, a health care provider inserts a tube with a light and camera (cystoscope) through the urethra and into the bladder in order to check for problems. Urodynamic testing. These tests assess how well the bladder, urethra, and sphincter can store and release urine. There are different types of urodynamic tests, and they vary depending on what the test is measuring. To help diagnose your condition, your health care provider may recommend that you keep a log of when you urinate and how much you urinate. How is this treated? Treatment for this condition depends on the type of incontinence that you have and its cause. Treatment may include: Lifestyle changes, such as: Quitting smoking. Maintaining a healthy weight. Staying active. Try to get 150 minutes of moderate-intensity exercise every week. Ask your health care provider which activities are safe for you. Eating a healthy diet. Avoid high-fat foods, like fried foods. Avoid refined carbohydrates like white bread and white rice. Limit how much alcohol and caffeine you drink. Increase your fiber intake. Foods such as fresh fruits, vegetables, beans, and whole grains are healthy sources of fiber. Pelvic floor muscle exercises. Bladder training, such as lengthening the amount of time between bathroom breaks, or using the bathroom at regular intervals. Using techniques to suppress bladder urges. This can include distraction techniques or controlled breathing exercises. Medicines to relax the  bladder muscles and prevent bladder spasms. Medicines to help slow or prevent the growth of a man's prostate. Botox injections. These can help relax the bladder muscles. Using pulses of electricity to help change bladder reflexes (electrical nerve stimulation). For women, using a medical device to prevent urine leaks. This is a small, tampon-like, disposable device that is inserted into the urethra. Injecting collagen or carbon beads (bulking agents) into the urinary sphincter. These can help thicken tissue and close the bladder opening. Surgery. Follow these instructions at home: Lifestyle Limit alcohol and caffeine. These can fill your bladder quickly and irritate it. Keep yourself clean to help prevent odors and skin damage. Ask your doctor about special skin creams and cleansers that can protect the skin from urine. Consider wearing pads or adult diapers. Make sure to change them regularly, and always change them right after experiencing incontinence. General instructions Take over-the-counter and prescription medicines only as told by your health care provider. Use the bathroom about every 3-4 hours, even if you do not feel the need to urinate. Try  to empty your bladder completely every time. After urinating, wait a minute. Then try to urinate again. Make sure you are in a relaxed position while urinating. If your incontinence is caused by nerve problems, keep a log of the medicines you take and the times you go to the bathroom. Keep all follow-up visits as told by your health care provider. This is important. Contact a health care provider if: You have pain that gets worse. Your incontinence gets worse. Get help right away if: You have a fever or chills. You are unable to urinate. You have redness in your groin area or down your legs. Summary Urinary incontinence refers to a condition in which a person is unable to control where and when to pass urine. This condition may be caused by  medicines, infection, weak bladder muscles, weak pelvic floor muscles, enlargement of the prostate (in men), or surgery. The following factors increase your risk for developing this condition: older age, obesity, pregnancy and childbirth, menopause, neurological diseases, and chronic coughing. There are several types of urinary incontinence. They include urge incontinence, stress incontinence, overflow incontinence, and functional incontinence. This condition is usually treated first with lifestyle and behavioral changes, such as quitting smoking, eating a healthier diet, and doing regular pelvic floor exercises. Other treatment options include medicines, bulking agents, medical devices, electrical nerve stimulation, or surgery. This information is not intended to replace advice given to you by your health care provider. Make sure you discuss any questions you have with your health care provider. Document Revised: 04/16/2017 Document Reviewed: 07/16/2016 Elsevier Patient Education  2021 [facility] Dr. Jeffie Pollock  [phone] [id number] [email or web address] [address]   [place] [address]

## 2021-02-07 ENCOUNTER — Telehealth: Payer: Self-pay | Admitting: Internal Medicine

## 2021-02-07 NOTE — Telephone Encounter (Signed)
-----   Message from Delorise Jackson, MD sent at 02/06/2021 11:47 AM EDT ----- Dr. Lyman Speller in [place] MD(I.e vaccines -call and update vaccines in chart he had above doctor   -vaccinesROI sentprevper pt had shingles vaccine, tdap, and pna shots [facility] [place] or Dr. Posey Pronto above   Call both to log vaccines please

## 2021-02-10 DIAGNOSIS — R002 Palpitations: Secondary | ICD-10-CM | POA: Insufficient documentation

## 2021-02-10 DIAGNOSIS — Z8546 Personal history of malignant neoplasm of prostate: Secondary | ICD-10-CM | POA: Insufficient documentation

## 2021-02-10 HISTORY — DX: Personal history of malignant neoplasm of prostate: Z85.46

## 2021-02-10 HISTORY — DX: Palpitations: R00.2

## 2021-02-12 DIAGNOSIS — H35373 Puckering of macula, bilateral: Secondary | ICD-10-CM | POA: Diagnosis not present

## 2021-02-12 DIAGNOSIS — H26493 Other secondary cataract, bilateral: Secondary | ICD-10-CM | POA: Diagnosis not present

## 2021-02-12 DIAGNOSIS — H04123 Dry eye syndrome of bilateral lacrimal glands: Secondary | ICD-10-CM | POA: Diagnosis not present

## 2021-02-12 DIAGNOSIS — H35341 Macular cyst, hole, or pseudohole, right eye: Secondary | ICD-10-CM | POA: Diagnosis not present

## 2021-02-12 NOTE — Telephone Encounter (Signed)
Unable to find the exact provider and pharmacies that Patient saw in that state.   Needing to know the name of what [place] office Patient was seen at, not just the provider name. Multiple providers in different areas with the same name.   Needing to know the address or phone numbers of which [facility] the Patient went to in [place].   Left message to return call.

## 2021-02-17 ENCOUNTER — Telehealth: Payer: Self-pay | Admitting: Gastroenterology

## 2021-02-17 MED ORDER — MESALAMINE 1.2 G PO TBEC: 2.4000 g | DELAYED_RELEASE_TABLET | Freq: Every day | ORAL | 0 refills | Status: DC

## 2021-02-17 NOTE — Telephone Encounter (Signed)
1 refill of Lialda provided for patient. Patient must keep follow up for further refills.

## 2021-02-18 NOTE — Progress Notes (Signed)
Cardiology Office Note    Date:  02/19/2021   ID:  Erik Nicholson, DOB 1950/07/23, MRN [id number]  PCP:  [name], [name], MD  Cardiologist:  Nelva Bush, MD  Electrophysiologist:  None   Chief Complaint: Follow-up  History of Present Illness:   Erik Nicholson is a 70 y.o. male with history of CAD status post multiple PCI's, HFpEF, PVD status post bilateral lower extremity stenting followed by vascular surgery, ulcerative colitis, COVID infection in 10/2020, hepatitis C, probable COPD, HTN, HLD, prostate cancer, and BPH who presents for follow-up of his CAD.   With regards to his CAD and PVD, notes indicate he has a history of 7 coronary and 3 lower extremity stents while living in the [place] area.  Most recent [place] in 03/2020 demonstrated severe single-vessel CAD with up to 75% ISR of the mid/distal RCA stent as well as 60 to 70% ostial stenosis of RPDA and RPL 1.  There was mild to moderate noncritical left coronary disease with most severe lesion located in a small to moderate caliber D1 branch that was jailed by prior LAD stent with up to 70% stenosis.  Normal LV systolic function with mildly elevated filling pressures.  He underwent PTCA of the mid/distal RCA with reduction in stenosis from 75% to 20% with TIMI-3 flow.  Stent placement was not attempted due to multiple layers of stent already present and incomplete expansion of a 3.5 mm noncompliant balloon at 22 ATM.  With percutaneous intervention he did note improvement in chest pain, though did continue to have significant exertional dyspnea.  PFTs showed probable small obstructive airway disease with bronchodilator response, for which he is followed by pulmonology.  Most recent echo from 05/14/2020 showed an EF of 55 to 60%, no regional wall motion abnormalities, mild LVH, grade 1 diastolic dysfunction, normal RV systolic function and ventricular cavity size, trivial mitral regurgitation, and an estimated right atrial  pressure of 3 mmHg.   He was seen in the office in 07/2020 and continued to feel about the same as he had on his prior visit with mild chest tightness rated a 1 out of 10 with briskly walking.  His chronic exertional dyspnea was stable.  Chronic leg pain and mild lower extremity edema were stable.  BP was elevated in the 326Z systolic.  He noted his telmisartan/HCTZ had recently been doubled, though despite this his BP remained elevated.  In this setting, he was started on amlodipine 5 mg.   He was seen in the ED on 10/02/2020 with intermittent acute on chronic dizziness that was worse with rotational movements of the head and positional changes.  High-sensitivity troponin negative x2.  BNP 11.  CT head showed no acute intracranial pathology.  Symptoms did not improve with meclizine.  He was advised to hold amlodipine. Marland Kitchen He tested positive for COVID infection on 10/20/2020.   He was seen in the ED on 10/31/2020 with intermittent chest discomfort, shortness of breath, and a mild dry cough.  High-sensitivity troponin negative x2.  D-dimer normal.  EKG showed sinus rhythm with PACs and possible prior inferior infarct.  Chest x-ray showed no evidence of acute cardiopulmonary disease.  Symptoms were felt to be in the setting of his recent COVID diagnosis versus GI in etiology with noted symptom improvement following IV Pepcid and Maalox.  He was treated with a Z-Pak, prednisone, and an inhaler.  He was last seen in the office on 11/18/2020 and had not had any recent chest  discomfort.  He continued to note chronic exertional dyspnea with activity such as climbing stairs or walking at a brisk pace.  Dyspnea improved with previously prescribed prednisone.  He was euvolemic.  Given symptoms, he underwent Lexiscan MPI on 11/25/2020 which showed no significant ischemia and was a low risk study with an EF of 55 to 65%.  He was recently evaluated by his PCP in 01/2021 with bilateral wrist pain stemming from a [place] injury  with noted to 10 out of 10 pain and swelling of the bilateral wrists.  Imaging showed concern for age-indeterminate injury versus possible ligamentous injury.  He was referred to orthopedics.  There was concern for possible palpitations with a rate of 88 bpm.  He comes in today and is doing reasonably well from a cardiac perspective.  He continues to note stable, chronic exertional dyspnea.  He has not noted a significant improvement in his dyspnea with inhalers.  This dyspnea is most noticeable after getting out of a hot shower into the cold air.  His weight is up 5 pounds today when compared to his visit in 11/2020.  No frank chest pain.  He also notes some mild lower extremity swelling and abdominal distention.  No orthopnea or early satiety.  He is watching his salt and fluid intake.  He has been monitoring his blood pressure more closely at home with most readings in the 120s to 130s millimeters mercury systolic.  With this monitoring, he has noted his heart rates will trend from the 60s to 80s to 90s bpm.  His main focus at this time is continued dyspnea.     Labs independently reviewed: 10/2020 - Hgb 11.6, PLT 181, potassium 3.6, BUN 21, serum creatinine 0.75 07/2020 - magnesium 1.9, albumin 3.8, AST/ALT normal 01/2020 - A1c 4.9, TC 126, TG 113, HDL 58, LDL 45, TSH normal   Past Medical History:  Diagnosis Date   Anemia    Arthritis    BPH (benign prostatic hyperplasia)    CAD (coronary artery disease)    x 7 cardiac stents   Chronic back pain    L4/5 with chronic right leg pain    COVID-19    10/20/20   [place], vitreous, left    Hx of hepatitis C    treated with Harvoni in 2014   Hyperlipidemia    Hypertension    Neuromuscular disorder ([place])    nerve damage in back   Neuropathy    Prostate cancer ([place])    PVD (peripheral vascular disease) ([place])    2 stents right leg and 1 in left    Ulcerative colitis ([place])    with diarrhea    Past Surgical History:  Procedure Laterality  Date   back surgery     x 2 lumbar last 09/2016 in [place], BILATERAL     COLONOSCOPY     last 09/2018 in [place] N/A 03/21/2020   Procedure: [place];  Surgeon: Nelva Bush, MD;  Location: [facility];  Service: Cardiovascular;  Laterality: N/A;   HERNIA REPAIR     right    INTRAVASCULAR PRESSURE WIRE/FFR STUDY N/A 03/21/2020   Procedure: INTRAVASCULAR PRESSURE WIRE/FFR STUDY;  Surgeon: Nelva Bush, MD;  Location: [facility];  Service: Cardiovascular;  Laterality: N/A;   LEFT HEART CATH AND CORONARY ANGIOGRAPHY N/A 03/21/2020   Procedure: LEFT HEART CATH AND CORONARY ANGIOGRAPHY;  Surgeon:  End, Harrell Gave, MD;  Location: [facility];  Service: Cardiovascular;  Laterality: N/A;   PENILE PROSTHESIS IMPLANT     PERCUTANEOUS CORONARY STENT INTERVENTION (PCI-S)     x 7-8 heart   PERIPHERAL ARTERIAL STENT GRAFT     PROSTATE BIOPSY     pvd     with stenting x 2 right leg below knee and 1 left thigh    SHOULDER ARTHROSCOPY Bilateral    VITRECTOMY     right eye    Current Medications: Current Meds  Medication Sig   albuterol (VENTOLIN HFA) 108 (90 Base) MCG/ACT inhaler Inhale 1-2 puffs into the lungs every 6 (six) hours as needed for wheezing or shortness of breath.   amLODipine (NORVASC) 10 MG tablet Take 1 tablet (10 mg total) by mouth daily.   atorvastatin (LIPITOR) 40 MG tablet TAKE 1 TABLET (40 MG TOTAL) BY MOUTH DAILY. PLEASE KEEP UPCOMING SCHEDULED APPOINTMENT   clopidogrel (PLAVIX) 75 MG tablet Take 1 tablet (75 mg total) by mouth daily.   cyclobenzaprine (FLEXERIL) 5 MG tablet TAKE 1-2 TABLETS (5-10 MG TOTAL) BY MOUTH AT BEDTIME AS NEEDED FOR MUSCLE SPASMS.   famotidine (PEPCID) 40 MG tablet TAKE 1 TABLET BY MOUTH TWICE A DAY   furosemide (LASIX) 20 MG tablet Take 1 tablet (20 mg total) by mouth as directed. Take 1 tablet daily for one  week.   hyoscyamine (LEVBID) 0.375 MG 12 hr tablet TAKE 1 TABLET (0.375 MG TOTAL) BY MOUTH 2 (TWO) TIMES DAILY. (NOTCOVERED)   isosorbide mononitrate (IMDUR) 60 MG 24 hr tablet TAKE 1 TABLET BY MOUTH EVERY DAY   lidocaine (LIDODERM) 5 % Place 1 patch onto the skin daily as needed (pain). Remove patch after 12 hours.   mesalamine (LIALDA) 1.2 g EC tablet Take 2 tablets (2.4 g total) by mouth daily with breakfast.   metoprolol succinate (TOPROL-XL) 100 MG 24 hr tablet TAKE 1 TABLET BY MOUTH DAILY. TAKE WITH OR IMMEDIATELY FOLLOWING A MEAL.   nitroGLYCERIN (NITROSTAT) 0.4 MG SL tablet TAKE 1 TABLET BY MOUTH SUBLINGUALLY EVERY 5 MINS AS NEEDED FOR CHEST PAIN. IF >1 NEEDED, CALL 911   pantoprazole (PROTONIX) 40 MG tablet Take 1 tablet (40 mg total) by mouth daily.   Potassium Chloride ER 20 MEQ TBCR Take 1 tablet by mouth daily.   RESTASIS 0.05 % ophthalmic emulsion Place 1 drop into both eyes 2 (two) times daily.    telmisartan-hydrochlorothiazide (MICARDIS HCT) 80-12.5 MG tablet Take 1 tablet by mouth daily. In am d/c 40-12.5 mg dose    Allergies:   Cymbalta [duloxetine hcl]   Social History   Socioeconomic History   Marital status: Married    Spouse name: Not on file   Number of children: 2   Years of education: Not on file   Highest education level: Not on file  Occupational History   Not on file  Tobacco Use   Smoking status: Former    Packs/day: 2.00    Years: 40.00    Pack years: 80.00    Types: Cigarettes    Quit date: 04/20/1998    Years since quitting: 22.8   Smokeless tobacco: Never   Tobacco comments:    quit in 2000s   Vaping Use   Vaping Use: Never used  Substance and Sexual Activity   Alcohol use: Yes    Comment: occasional   Drug use: Not Currently   Sexual activity: Yes  Other Topics Concern   Not on file  Social History  Narrative   Moved from [place] in 10/2018   2 sons in 66s as of 11/24/2018    Married wife is DPR Pamala Hurry x 45 years as of 04/2019    Social Determinants of Health   Financial Resource Strain: Low Risk    Difficulty of Paying Living Expenses: Not hard at all  Food Insecurity: No Food Insecurity   Worried About Charity fundraiser in the Last Year: Never true   Arboriculturist in the Last Year: Never true  Transportation Needs: No Transportation Needs   Lack of Transportation (Medical): No   Lack of Transportation (Non-Medical): No  Physical Activity: Not on file  Stress: No Stress Concern Present   Feeling of Stress : Not at all  Social Connections: Unknown   Frequency of Communication with Friends and Family: Not on file   Frequency of Social Gatherings with Friends and Family: Not on file   Attends Religious Services: Not on file   Active Member of Clubs or Organizations: Not on file   Attends Archivist Meetings: Not on file   Marital Status: Married     Family History:  The patient's family history includes Breast cancer in his mother; Diabetes in his brother and sister; Heart disease in his mother; Stroke in his mother. There is no history of Colon cancer, Esophageal cancer, Inflammatory bowel disease, Liver disease, Pancreatic cancer, Rectal cancer, or Stomach cancer.  ROS:   Review of Systems  Constitutional:  Positive for malaise/fatigue. Negative for chills, diaphoresis, fever and weight loss.  HENT:  Negative for congestion.   Eyes:  Negative for discharge and redness.  Respiratory:  Positive for shortness of breath. Negative for cough, sputum production and wheezing.   Cardiovascular:  Positive for leg swelling. Negative for chest pain, palpitations, orthopnea, claudication and PND.  Gastrointestinal:  Negative for abdominal pain, blood in stool, heartburn, melena, nausea and vomiting.  Musculoskeletal:  Positive for falls and joint pain. Negative for myalgias.  Skin:  Negative for rash.  Neurological:  Negative for dizziness, tingling, tremors, sensory change, speech change, focal  weakness, loss of consciousness and weakness.  Endo/Heme/Allergies:  Does not bruise/bleed easily.  Psychiatric/Behavioral:  Negative for substance abuse. The patient is not nervous/anxious.   All other systems reviewed and are negative.   EKGs/Labs/Other Studies Reviewed:    Studies reviewed were summarized above. The additional studies were reviewed today:  Lexiscan MPI 11/2020: ST segment elevation was noted during stress. No T wave inversion was noted during stress. The study is normal. This is a low risk study. The left ventricular ejection fraction is normal (55-65%). CT attenuation images showed significant coronary calcifications and mild aortic calcifications. __________  2D echo 05/14/2020: 1. Left ventricular ejection fraction, by estimation, is 55 to 60%. The  left ventricle has normal function. The left ventricle has no regional  wall motion abnormalities. There is mild left ventricular hypertrophy.  Left ventricular diastolic parameters  are consistent with Grade I diastolic dysfunction (impaired relaxation).  The average left ventricular global longitudinal strain is -16.3 %.   2. Right ventricular systolic function is normal. The right ventricular  size is normal.   3. The mitral valve is grossly normal. Trivial mitral valve  regurgitation. No evidence of mitral stenosis.   4. The aortic valve was not well visualized. Aortic valve regurgitation  is not visualized. No aortic stenosis is present.   5. The inferior vena cava is normal in size with greater than 50%  respiratory variability, suggesting right atrial pressure of 3 mmHg. __________   LHC 03/21/2020: Conclusions: Severe single-vessel coronary artery disease with up to 75% in-stent restenosis of the mid/distal RCA (FFR 0.70), as well as 60-70% ostial stenoses of rPDA and rPL1. Mild to moderate, non-critical left coronary artery disease.  The most severe lesion is a small to moderate caliber D1 branch that is  jailed by LAD stent with up to 70% stenosis. Normal left ventricular systolic function with mildly elevated filling pressure. Successful PTCA of mid/distal RCA with reduction in stenosis from 75% to 20% with TIMI-3 flow.  Stent placement not attempted due to multiple layers of stent already present and incomplete expansion of 3.5 mm non-compliant balloon at 22 atm.   Recommendations: Continue aggressive secondary prevention. Indefinite dual antiplatelet therapy with aspirin and clopidogrel; discontinuation of aspirin could be considered after 6 months of therapy. Anticipate same-day discharge if no post-catheterization complication occur. __________   [facility] Adam MPI 03/07/2019: Normal pharmacologic myocardial perfusion stress test without significant ischemia or scar. The left ventricular ejection fraction is normal by visual estimation and Siemens calculation (56%). Dense coronary artery calcifications and/or prior coronary stents are noted on the attenuation correction CT. This is a low risk study. __________   2D echo 03/02/2019: 1. Left ventricular ejection fraction, by visual estimation, is 60 to  65%. The left ventricle has normal function. There is no left ventricular  hypertrophy.   2. Left ventricular diastolic parameters are consistent with Grade I  diastolic dysfunction (impaired relaxation).   3. Global right ventricle has normal systolic function.The right  ventricular size is normal. No increase in right ventricular wall  thickness.   4. Left atrial size was normal.   5. Normal pulmonary artery systolic pressure. __________   [facility] 02/25/2012 [facility]): Patent stents in the proximal and mid RCA, 40% mid RCA stenosis, normal left heart pressures, normal LV systolic function  EKG:  EKG is ordered today.  The EKG ordered today demonstrates NSR, 64 bpm, first-degree AV block, inferior Q waves, no acute ST-T changes  Recent Labs: 08/06/2020: ALT 28;  Magnesium 1.9 10/02/2020: B Natriuretic Peptide 11.9 10/31/2020: BUN 21; Creatinine, Ser 0.75; Hemoglobin 11.6; Platelets 181; Potassium 3.6; Sodium 138  Recent Lipid Panel    Component Value Date/Time   CHOL 126 01/30/2020 0803   TRIG 113.0 01/30/2020 0803   HDL 58.80 01/30/2020 0803   CHOLHDL 2 01/30/2020 0803   VLDL 22.6 01/30/2020 0803   LDLCALC 45 01/30/2020 0803    PHYSICAL EXAM:    VS:  BP 128/70 (BP Location: Left Arm, Patient Position: Sitting, Cuff Size: Normal)   Pulse 64   Ht _0  (1.702 m)   Wt 180 lb 6 oz (81.8 kg)   SpO2 98%   BMI 28.25 kg/m   BMI: Body mass index is 28.25 kg/m.  Physical Exam Vitals reviewed.  Constitutional:      Appearance: He is well-developed.  HENT:     Head: Normocephalic and atraumatic.  Eyes:     General:        Right eye: No discharge.        Left eye: No discharge.  Neck:     Vascular: No JVD.  Cardiovascular:     Rate and Rhythm: Normal rate and regular rhythm.     Pulses:          Posterior tibial pulses are 2+ on the right side and 2+ on the left side.     Heart  sounds: Normal heart sounds, S1 normal and S2 normal. Heart sounds not distant. No midsystolic click and no opening snap. No murmur heard.   No friction rub.  Pulmonary:     Effort: Pulmonary effort is normal. No respiratory distress.     Breath sounds: Examination of the right-lower field reveals rales. Examination of the left-lower field reveals rales. Rales present. No decreased breath sounds or wheezing.  Chest:     Chest wall: No tenderness.  Abdominal:     General: There is distension.     Palpations: Abdomen is soft.     Tenderness: There is no abdominal tenderness.  Musculoskeletal:     Cervical back: Normal range of motion.     Right lower leg: Edema present.     Left lower leg: Edema present.     Comments: Trace bilateral ankle edema  Skin:    General: Skin is warm and dry.     Nails: There is no clubbing.  Neurological:     Mental Status: He  is alert and oriented to person, place, and time.  Psychiatric:        Speech: Speech normal.        Behavior: Behavior normal.        Thought Content: Thought content normal.        Judgment: Judgment normal.    Wt Readings from Last 3 Encounters:  02/19/21 180 lb 6 oz (81.8 kg)  02/06/21 182 lb 3.2 oz (82.6 kg)  11/18/20 175 lb (79.4 kg)     ASSESSMENT & PLAN:   Exertional dyspnea: Symptoms are overall stable and likely multifactorial including underlying CAD, HFpEF, pulmonary disease, recent COVID infection, and possible component of physical deconditioning.  Plan as outlined below.  CAD involving the native coronary arteries with stable angina: He is without symptoms of chest pain, though does continue to note exertional dyspnea.  Recent Lexiscan MPI was low risk.  We will undergo a trial of diuresis given volume overload as outlined below.,  If despite optimization of medical therapy, and diuresis, his symptoms of exertional dyspnea persist, we may need to consider R/LHC.  He will continue clopidogrel, amlodipine, atorvastatin, isosorbide mononitrate, and metoprolol succinate.  Aggressive risk factor modification.  HFpEF: He does appear mildly volume overloaded on exam today.  His weight is up 4 to 5 pounds when compared to his last several visits.  His abdomen is mildly distended and there are faint crackles along his bilateral bases.  Initiate furosemide 20 mg daily along with continuation of KCl.  We will see him back in 2 weeks to reassess volume status and dyspnea.  We will follow-up labs at that time.  Palpitations: Patient has noted his heart rate fluctuates between the 60s to 80s to 90s bpm at home with closer blood pressure monitoring.  He noted palpitations at his recent PCP visit.  EKG not performed at that time.  Currently, he is in sinus rhythm with a known first-degree AV block.  Place Zio patch.  He remains on Toprol-XL.  Probable COPD: Dyspnea is most noticeable after  getting out of a hot shower into the cold air, possibly raising the question of a pulmonary component.  Follow-up with pulmonology as directed.  HTN: Blood pressure is well controlled in the office.  He remains on amlodipine, isosorbide mononitrate, metoprolol succinate, and telmisartan/HCTZ.  Low-sodium diet recommended.  HLD: LDL 45 in 01/2020.  He remains on atorvastatin 40 mg.  PAD with stable claudication: No symptoms of lifestyle  limiting claudication.  He remains on clopidogrel and atorvastatin.  Follow-up with vascular surgery as directed.   Disposition: F/u with Dr. Saunders Revel or an APP in 2 weeks.   Medication Adjustments/Labs and Tests Ordered: Current medicines are reviewed at length with the patient today.  Concerns regarding medicines are outlined above. Medication changes, Labs and Tests ordered today are summarized above and listed in the Patient Instructions accessible in Encounters.   Signed, Christell Faith, PA-C 02/19/2021 11:23 AM     [facility] [address] [place], [place] [address] [phone]

## 2021-02-19 ENCOUNTER — Ambulatory Visit: Payer: Medicare Other | Admitting: Physician Assistant

## 2021-02-19 ENCOUNTER — Ambulatory Visit (INDEPENDENT_AMBULATORY_CARE_PROVIDER_SITE_OTHER): Payer: Medicare Other

## 2021-02-19 ENCOUNTER — Encounter: Payer: Self-pay | Admitting: Physician Assistant

## 2021-02-19 ENCOUNTER — Other Ambulatory Visit: Payer: Self-pay

## 2021-02-19 VITALS — BP 128/70 | HR 64 | Ht 67.0 in | Wt 180.4 lb

## 2021-02-19 DIAGNOSIS — I25118 Atherosclerotic heart disease of native coronary artery with other forms of angina pectoris: Secondary | ICD-10-CM | POA: Diagnosis not present

## 2021-02-19 DIAGNOSIS — R0609 Other forms of dyspnea: Secondary | ICD-10-CM

## 2021-02-19 DIAGNOSIS — R002 Palpitations: Secondary | ICD-10-CM | POA: Diagnosis not present

## 2021-02-19 DIAGNOSIS — E785 Hyperlipidemia, unspecified: Secondary | ICD-10-CM | POA: Diagnosis not present

## 2021-02-19 DIAGNOSIS — I5032 Chronic diastolic (congestive) heart failure: Secondary | ICD-10-CM | POA: Diagnosis not present

## 2021-02-19 DIAGNOSIS — I739 Peripheral vascular disease, unspecified: Secondary | ICD-10-CM

## 2021-02-19 DIAGNOSIS — I10 Essential (primary) hypertension: Secondary | ICD-10-CM | POA: Diagnosis not present

## 2021-02-19 DIAGNOSIS — J449 Chronic obstructive pulmonary disease, unspecified: Secondary | ICD-10-CM

## 2021-02-19 MED ORDER — FUROSEMIDE 20 MG PO TABS: 20.0000 mg | ORAL_TABLET | ORAL | 3 refills | Status: DC

## 2021-02-19 NOTE — Patient Instructions (Signed)
Medication Instructions:  Your physician has recommended you make the following change in your medication:   START Furosemide 20 mg once a day for one week.   *If you need a refill on your cardiac medications before your next appointment, please call your pharmacy*   Lab Work: None  If you have labs (blood work) drawn today and your tests are completely normal, you will receive your results only by: [place] (if you have MyChart) OR A paper copy in the mail If you have any lab test that is abnormal or we need to change your treatment, we will call you to review the results.   Testing/Procedures: Your physician has recommended that you wear a Zio monitor. Remove monitor on March 05, 2021 and return in box provided.  This monitor is a medical device that records the heart's electrical activity. Doctors most often use these monitors to diagnose arrhythmias. Arrhythmias are problems with the speed or rhythm of the heartbeat. The monitor is a small device applied to your chest. You can wear one while you do your normal daily activities. While wearing this monitor if you have any symptoms to push the button and record what you felt. Once you have worn this monitor for the period of time provider prescribed (Usually 14 days), you will return the monitor device in the postage paid box. Once it is returned they will download the data collected and provide [place] with a report which the provider will then review and we will call you with those results. Important tips:  Avoid showering during the first 24 hours of wearing the monitor. Avoid excessive sweating to help maximize wear time. Do not submerge the device, no hot tubs, and no swimming pools. Keep any lotions or oils away from the patch. After 24 hours you may shower with the patch on. Take brief showers with your back facing the shower head.  Do not remove patch once it has been placed because that will interrupt data and decrease  adhesive wear time. Push the button when you have any symptoms and write down what you were feeling. Once you have completed wearing your monitor, remove and place into box which has postage paid and place in your outgoing mailbox.  If for some reason you have misplaced your box then call our office and we can provide another box and/or mail it off for you.      Follow-Up: At [facility], you and your health needs are our priority.  As part of our continuing mission to provide you with exceptional heart care, we have created designated Provider Care Teams.  These Care Teams include your primary Cardiologist (physician) and Advanced Practice Providers (APPs -  Physician Assistants and Nurse Practitioners) who all work together to provide you with the care you need, when you need it.    Your next appointment:   2 week(s)  The format for your next appointment:   In Person  Provider:   Nelva Bush, MD or Christell Faith, PA-C

## 2021-02-20 NOTE — Telephone Encounter (Signed)
Called and Patient states he went to [facility] in [place]. Patient thinks he got it a Insurance underwriter [phone].   Patient states that he does not know the name of the clinic. States provider's middle name is Architectural technologist.   Will look in to these places to get immunizations.

## 2021-02-23 ENCOUNTER — Other Ambulatory Visit: Payer: Self-pay | Admitting: Internal Medicine

## 2021-02-25 DIAGNOSIS — M25531 Pain in right wrist: Secondary | ICD-10-CM | POA: Diagnosis not present

## 2021-02-25 DIAGNOSIS — M25532 Pain in left wrist: Secondary | ICD-10-CM | POA: Diagnosis not present

## 2021-02-25 DIAGNOSIS — S63501A Unspecified sprain of right wrist, initial encounter: Secondary | ICD-10-CM | POA: Diagnosis not present

## 2021-03-10 ENCOUNTER — Ambulatory Visit: Payer: Medicare Other | Admitting: Physician Assistant

## 2021-03-10 ENCOUNTER — Other Ambulatory Visit: Payer: Self-pay

## 2021-03-10 ENCOUNTER — Encounter: Payer: Self-pay | Admitting: Physician Assistant

## 2021-03-10 VITALS — BP 130/80 | HR 74 | Ht 67.0 in | Wt 184.0 lb

## 2021-03-10 DIAGNOSIS — I739 Peripheral vascular disease, unspecified: Secondary | ICD-10-CM

## 2021-03-10 DIAGNOSIS — J449 Chronic obstructive pulmonary disease, unspecified: Secondary | ICD-10-CM

## 2021-03-10 DIAGNOSIS — I5032 Chronic diastolic (congestive) heart failure: Secondary | ICD-10-CM | POA: Diagnosis not present

## 2021-03-10 DIAGNOSIS — I10 Essential (primary) hypertension: Secondary | ICD-10-CM

## 2021-03-10 DIAGNOSIS — R0609 Other forms of dyspnea: Secondary | ICD-10-CM | POA: Diagnosis not present

## 2021-03-10 DIAGNOSIS — E785 Hyperlipidemia, unspecified: Secondary | ICD-10-CM | POA: Diagnosis not present

## 2021-03-10 DIAGNOSIS — I25118 Atherosclerotic heart disease of native coronary artery with other forms of angina pectoris: Secondary | ICD-10-CM | POA: Diagnosis not present

## 2021-03-10 DIAGNOSIS — R002 Palpitations: Secondary | ICD-10-CM | POA: Diagnosis not present

## 2021-03-10 NOTE — Patient Instructions (Signed)
Medication Instructions:  No changes at this time.  *If you need a refill on your cardiac medications before your next appointment, please call your pharmacy*   Lab Work: BMET today  If you have labs (blood work) drawn today and your tests are completely normal, you will receive your results only by: [facility] (if you have MyChart) OR A paper copy in the mail If you have any lab test that is abnormal or we need to change your treatment, we will call you to review the results.   Testing/Procedures: None   Follow-Up: At [facility], you and your health needs are our priority.  As part of our continuing mission to provide you with exceptional heart care, we have created designated Provider Care Teams.  These Care Teams include your primary Cardiologist (physician) and Advanced Practice Providers (APPs -  Physician Assistants and Nurse Practitioners) who all work together to provide you with the care you need, when you need it.   Your next appointment:   6 month(s)  The format for your next appointment:   In Person  Provider:   Nelva Bush, MD or Christell Faith, PA-C

## 2021-03-10 NOTE — Progress Notes (Signed)
Cardiology Office Note    Date:  03/10/2021   ID:  Erik Nicholson, Erik Nicholson 17-Oct-1950, MRN [id number]  PCP:  [name], [name], MD  Cardiologist:  Nelva Bush, MD  Electrophysiologist:  None   Chief Complaint: Follow-up  History of Present Illness:   Orion Mole is a 70 y.o. male with history of CAD status post multiple PCI's, HFpEF, PVD status post bilateral lower extremity stenting followed by vascular surgery, ulcerative colitis, COVID infection in 10/2020, hepatitis C, probable COPD, HTN, HLD, prostate cancer, and BPH who presents for follow-up of palpitations and volume overload.   With regards to his CAD and PVD, notes indicate he has a history of 7 coronary and 3 lower extremity stents while living in the [place] area.  Most recent [place] in 03/2020 demonstrated severe single-vessel CAD with up to 75% ISR of the mid/distal RCA stent as well as 60 to 70% ostial stenosis of RPDA and RPL 1.  There was mild to moderate noncritical left coronary disease with most severe lesion located in a small to moderate caliber D1 branch that was jailed by prior LAD stent with up to 70% stenosis.  Normal LV systolic function with mildly elevated filling pressures.  He underwent PTCA of the mid/distal RCA with reduction in stenosis from 75% to 20% with TIMI-3 flow.  Stent placement was not attempted due to multiple layers of stent already present and incomplete expansion of a 3.5 mm noncompliant balloon at 22 ATM.  With percutaneous intervention he did note improvement in chest pain, though did continue to have significant exertional dyspnea.  PFTs showed probable small obstructive airway disease with bronchodilator response, for which he is followed by pulmonology.  Most recent echo from 05/14/2020 showed an EF of 55 to 60%, no regional wall motion abnormalities, mild LVH, grade 1 diastolic dysfunction, normal RV systolic function and ventricular cavity size, trivial mitral regurgitation, and an  estimated right atrial pressure of 3 mmHg.  PFTs in 05/2020 showed evidence of obstructive airway disease.   He was seen in the office in 07/2020 and continued to feel about the same as he had on his prior visit with mild chest tightness rated a 1 out of 10 with briskly walking.  His chronic exertional dyspnea was stable.  Chronic leg pain and mild lower extremity edema were stable.  BP was elevated in the 160V systolic.  He noted his telmisartan/HCTZ had recently been doubled, though despite this his BP remained elevated.  In this setting, he was started on amlodipine 5 mg.   He was seen in the ED on 10/02/2020 with intermittent acute on chronic dizziness that was worse with rotational movements of the head and positional changes.  High-sensitivity troponin negative x2.  BNP 11.  CT head showed no acute intracranial pathology.  Symptoms did not improve with meclizine.  He was advised to hold amlodipine.  He tested positive for COVID infection on 10/20/2020.   He was seen in the ED on 10/31/2020 with intermittent chest discomfort, shortness of breath, and a mild dry cough.  High-sensitivity troponin negative x2.  D-dimer normal.  EKG showed sinus rhythm with PACs and possible prior inferior infarct.  Chest x-ray showed no evidence of acute cardiopulmonary disease.  Symptoms were felt to be in the setting of his recent COVID diagnosis versus GI in etiology with noted symptom improvement following IV Pepcid and Maalox.  He was treated with a Z-Pak, prednisone, and an inhaler.   He was seen  in the office on 11/18/2020 and had not had any recent chest discomfort.  He continued to note chronic exertional dyspnea with activity such as climbing stairs or walking at a brisk pace.  Dyspnea improved with previously prescribed prednisone.  He was euvolemic.  Given symptoms, he underwent Lexiscan MPI on 11/25/2020 which showed no significant ischemia and was a low risk study with an EF of 55 to 65%.   He was recently  evaluated by his PCP in 01/2021 with bilateral wrist pain stemming from a Hutchins injury with noted to 10 out of 10 pain and swelling of the bilateral wrists.  Imaging showed concern for age-indeterminate injury versus possible ligamentous injury.  He was referred to orthopedics.  There was concern for possible palpitations.  He was last seen in the office on 02/19/2021 and was doing reasonably well from a cardiac perspective with stable chronic exertional dyspnea.  He also noted fluctuations in his heart rates between the 60s to 90s bpm.  He was mildly volume up and initiated on Lasix 20 mg daily.  Zio patch was placed which is pending at this time.   He comes in doing reasonably well from a cardiac perspective.  He notes stable chronic dyspnea which is most noticeable after getting out of a hot shower into the cold air.  He did note vigorous urine output with Lasix, though no symptomatic change in his dyspnea or weight.  His weight remains stable at home around 180 pounds.  He is trying to watch his salt intake.  No chest pain or palpitations.  No dizziness, presyncope, or syncope.  Zio patch results remain pending.  Given stable symptoms, he prefers a less invasive approach at this time.     Labs independently reviewed: 10/2020 - Hgb 11.6, PLT 181, potassium 3.6, BUN 21, serum creatinine 0.75 07/2020 - magnesium 1.9, albumin 3.8, AST/ALT normal 01/2020 - A1c 4.9, TC 126, TG 113, HDL 58, LDL 45, TSH normal   Past Medical History:  Diagnosis Date   Anemia    Arthritis    BPH (benign prostatic hyperplasia)    CAD (coronary artery disease)    x 7 cardiac stents   Chronic back pain    L4/5 with chronic right leg pain    COVID-19    10/20/20   [place], vitreous, left    Hx of hepatitis C    treated with Harvoni in 2014   Hyperlipidemia    Hypertension    Neuromuscular disorder ([place])    nerve damage in back   Neuropathy    Prostate cancer ([place])    PVD (peripheral vascular disease) ([place])    2  stents right leg and 1 in left    Ulcerative colitis ([place])    with diarrhea    Past Surgical History:  Procedure Laterality Date   back surgery     x 2 lumbar last 09/2016 in [place], BILATERAL     COLONOSCOPY     last 09/2018 in [place] N/A 03/21/2020   Procedure: [place];  Surgeon: Nelva Bush, MD;  Location: [facility];  Service: Cardiovascular;  Laterality: N/A;   HERNIA REPAIR     right    INTRAVASCULAR PRESSURE WIRE/FFR STUDY N/A 03/21/2020   Procedure: INTRAVASCULAR PRESSURE WIRE/FFR STUDY;  Surgeon: Nelva Bush, MD;  Location: [facility];  Service: Cardiovascular;  Laterality: N/A;   LEFT  HEART CATH AND CORONARY ANGIOGRAPHY N/A 03/21/2020   Procedure: LEFT HEART CATH AND CORONARY ANGIOGRAPHY;  Surgeon: Nelva Bush, MD;  Location: [facility];  Service: Cardiovascular;  Laterality: N/A;   PENILE PROSTHESIS IMPLANT     PERCUTANEOUS CORONARY STENT INTERVENTION (PCI-S)     x 7-8 heart   PERIPHERAL ARTERIAL STENT GRAFT     PROSTATE BIOPSY     pvd     with stenting x 2 right leg below knee and 1 left thigh    SHOULDER ARTHROSCOPY Bilateral    VITRECTOMY     right eye    Current Medications: Current Meds  Medication Sig   albuterol (VENTOLIN HFA) 108 (90 Base) MCG/ACT inhaler Inhale 1-2 puffs into the lungs every 6 (six) hours as needed for wheezing or shortness of breath.   amLODipine (NORVASC) 10 MG tablet Take 1 tablet (10 mg total) by mouth daily.   atorvastatin (LIPITOR) 40 MG tablet TAKE 1 TABLET (40 MG TOTAL) BY MOUTH DAILY. PLEASE KEEP UPCOMING SCHEDULED APPOINTMENT   clopidogrel (PLAVIX) 75 MG tablet Take 1 tablet (75 mg total) by mouth daily.   cyclobenzaprine (FLEXERIL) 5 MG tablet TAKE 1-2 TABLETS (5-10 MG TOTAL) BY MOUTH AT BEDTIME AS NEEDED FOR MUSCLE SPASMS.   famotidine (PEPCID) 40 MG tablet TAKE 1 TABLET BY  MOUTH TWICE A DAY   hyoscyamine (LEVBID) 0.375 MG 12 hr tablet TAKE 1 TABLET (0.375 MG TOTAL) BY MOUTH 2 (TWO) TIMES DAILY. (NOTCOVERED)   isosorbide mononitrate (IMDUR) 60 MG 24 hr tablet TAKE 1 TABLET BY MOUTH EVERY DAY   lidocaine (LIDODERM) 5 % Place 1 patch onto the skin daily as needed (pain). Remove patch after 12 hours.   mesalamine (LIALDA) 1.2 g EC tablet Take 2 tablets (2.4 g total) by mouth daily with breakfast.   metoprolol succinate (TOPROL-XL) 100 MG 24 hr tablet TAKE 1 TABLET BY MOUTH DAILY. TAKE WITH OR IMMEDIATELY FOLLOWING A MEAL.   nitroGLYCERIN (NITROSTAT) 0.4 MG SL tablet TAKE 1 TABLET BY MOUTH SUBLINGUALLY EVERY 5 MINS AS NEEDED FOR CHEST PAIN. IF >1 NEEDED, CALL 911   pantoprazole (PROTONIX) 40 MG tablet Take 1 tablet (40 mg total) by mouth daily.   Potassium Chloride ER 20 MEQ TBCR Take 1 tablet by mouth daily.   RESTASIS 0.05 % ophthalmic emulsion Place 1 drop into both eyes 2 (two) times daily.    telmisartan-hydrochlorothiazide (MICARDIS HCT) 80-12.5 MG tablet Take 1 tablet by mouth daily. In am d/c 40-12.5 mg dose    Allergies:   Cymbalta [duloxetine hcl]   Social History   Socioeconomic History   Marital status: Married    Spouse name: Not on file   Number of children: 2   Years of education: Not on file   Highest education level: Not on file  Occupational History   Not on file  Tobacco Use   Smoking status: Former    Packs/day: 2.00    Years: 40.00    Pack years: 80.00    Types: Cigarettes    Quit date: 04/20/1998    Years since quitting: 22.9   Smokeless tobacco: Never   Tobacco comments:    quit in 2000s   Vaping Use   Vaping Use: Never used  Substance and Sexual Activity   Alcohol use: Yes    Comment: occasional   Drug use: Not Currently   Sexual activity: Yes  Other Topics Concern   Not on file  Social History Narrative   Moved from [place] in 10/2018   2 sons in 6s as of 11/24/2018    Married wife is [place] x 45 years as of  04/2019   Social Determinants of Radio broadcast assistant Strain: Low Risk    Difficulty of Paying Living Expenses: Not hard at all  Food Insecurity: No Food Insecurity   Worried About Charity fundraiser in the Last Year: Never true   Arboriculturist in the Last Year: Never true  Transportation Needs: No Transportation Needs   Lack of Transportation (Medical): No   Lack of Transportation (Non-Medical): No  Physical Activity: Not on file  Stress: No Stress Concern Present   Feeling of Stress : Not at all  Social Connections: Unknown   Frequency of Communication with Friends and Family: Not on file   Frequency of Social Gatherings with Friends and Family: Not on file   Attends Religious Services: Not on file   Active Member of Clubs or Organizations: Not on file   Attends Archivist Meetings: Not on file   Marital Status: Married     Family History:  The patient's family history includes Breast cancer in his mother; Diabetes in his brother and sister; Heart disease in his mother; Stroke in his mother. There is no history of Colon cancer, Esophageal cancer, Inflammatory bowel disease, Liver disease, Pancreatic cancer, Rectal cancer, or Stomach cancer.  ROS:   Review of Systems  Constitutional:  Positive for malaise/fatigue. Negative for chills, diaphoresis, fever and weight loss.  HENT:  Negative for congestion.   Eyes:  Negative for discharge and redness.  Respiratory:  Positive for shortness of breath. Negative for cough, sputum production and wheezing.   Cardiovascular:  Negative for chest pain, palpitations, orthopnea, claudication, leg swelling and PND.  Gastrointestinal:  Negative for abdominal pain, heartburn, nausea and vomiting.  Musculoskeletal:  Positive for joint pain. Negative for falls and myalgias.  Skin:  Negative for rash.  Neurological:  Negative for dizziness, tingling, tremors, sensory change, speech change, focal weakness, loss of consciousness and  weakness.  Endo/Heme/Allergies:  Does not bruise/bleed easily.  Psychiatric/Behavioral:  Negative for substance abuse. The patient is not nervous/anxious.   All other systems reviewed and are negative.   EKGs/Labs/Other Studies Reviewed:    Studies reviewed were summarized above. The additional studies were reviewed today:  Lexiscan MPI 11/2020: ST segment elevation was noted during stress. No T wave inversion was noted during stress. The study is normal. This is a low risk study. The left ventricular ejection fraction is normal (55-65%). CT attenuation images showed significant coronary calcifications and mild aortic calcifications. __________   2D echo 05/14/2020: 1. Left ventricular ejection fraction, by estimation, is 55 to 60%. The  left ventricle has normal function. The left ventricle has no regional  wall motion abnormalities. There is mild left ventricular hypertrophy.  Left ventricular diastolic parameters  are consistent with Grade I diastolic dysfunction (impaired relaxation).  The average left ventricular global longitudinal strain is -16.3 %.   2. Right ventricular systolic function is normal. The right ventricular  size is normal.   3. The mitral valve is grossly normal. Trivial mitral valve  regurgitation. No evidence of mitral stenosis.   4. The aortic valve was not well visualized. Aortic valve regurgitation  is not visualized. No aortic stenosis is present.   5. The inferior vena cava is normal in size with greater than 50%  respiratory variability, suggesting right atrial pressure of 3 mmHg. __________  LHC 03/21/2020: Conclusions: Severe single-vessel coronary artery disease with up to 75% in-stent restenosis of the mid/distal RCA (FFR 0.70), as well as 60-70% ostial stenoses of rPDA and rPL1. Mild to moderate, non-critical left coronary artery disease.  The most severe lesion is a small to moderate caliber D1 branch that is jailed by LAD stent with up to 70%  stenosis. Normal left ventricular systolic function with mildly elevated filling pressure. Successful PTCA of mid/distal RCA with reduction in stenosis from 75% to 20% with TIMI-3 flow.  Stent placement not attempted due to multiple layers of stent already present and incomplete expansion of 3.5 mm non-compliant balloon at 22 atm.   Recommendations: Continue aggressive secondary prevention. Indefinite dual antiplatelet therapy with aspirin and clopidogrel; discontinuation of aspirin could be considered after 6 months of therapy. Anticipate same-day discharge if no post-catheterization complication occur. __________   [facility] MPI 03/07/2019: Normal pharmacologic myocardial perfusion stress test without significant ischemia or scar. The left ventricular ejection fraction is normal by visual estimation and Siemens calculation (56%). Dense coronary artery calcifications and/or prior coronary stents are noted on the attenuation correction CT. This is a low risk study. __________   2D echo 03/02/2019: 1. Left ventricular ejection fraction, by visual estimation, is 60 to  65%. The left ventricle has normal function. There is no left ventricular  hypertrophy.   2. Left ventricular diastolic parameters are consistent with Grade I  diastolic dysfunction (impaired relaxation).   3. Global right ventricle has normal systolic function.The right  ventricular size is normal. No increase in right ventricular wall  thickness.   4. Left atrial size was normal.   5. Normal pulmonary artery systolic pressure. __________   [facility] 02/25/2012 [facility]): Patent stents in the proximal and mid RCA, 40% mid RCA stenosis, normal left heart pressures, normal LV systolic function    EKG:  EKG is not ordered today.   Recent Labs: 08/06/2020: ALT 28; Magnesium 1.9 10/02/2020: B Natriuretic Peptide 11.9 10/31/2020: BUN 21; Creatinine, Ser 0.75; Hemoglobin 11.6; Platelets 181; Potassium 3.6;  Sodium 138  Recent Lipid Panel    Component Value Date/Time   CHOL 126 01/30/2020 0803   TRIG 113.0 01/30/2020 0803   HDL 58.80 01/30/2020 0803   CHOLHDL 2 01/30/2020 0803   VLDL 22.6 01/30/2020 0803   LDLCALC 45 01/30/2020 0803    PHYSICAL EXAM:    VS:  BP 130/80 (BP Location: Left Arm, Patient Position: Sitting, Cuff Size: Normal)   Pulse 74   Ht _0  (1.702 m)   Wt 184 lb (83.5 kg)   SpO2 98%   BMI 28.82 kg/m   BMI: Body mass index is 28.82 kg/m.  Physical Exam Vitals reviewed.  Constitutional:      Appearance: He is well-developed.  HENT:     Head: Normocephalic and atraumatic.  Eyes:     General:        Right eye: No discharge.        Left eye: No discharge.  Neck:     Vascular: No JVD.  Cardiovascular:     Rate and Rhythm: Normal rate and regular rhythm.     Pulses:          Posterior tibial pulses are 2+ on the right side and 2+ on the left side.     Heart sounds: Normal heart sounds, S1 normal and S2 normal. Heart sounds not distant. No midsystolic click and no opening snap. No murmur heard.   No friction rub.  Pulmonary:     Effort: Pulmonary effort is normal. No respiratory distress.     Breath sounds: Normal breath sounds. No decreased breath sounds, wheezing or rales.  Chest:     Chest wall: No tenderness.  Abdominal:     General: There is no distension.     Palpations: Abdomen is soft.     Tenderness: There is no abdominal tenderness.  Musculoskeletal:     Cervical back: Normal range of motion.     Right lower leg: No edema.     Left lower leg: No edema.     Comments: Wrist brace on the right upper extremity  Skin:    General: Skin is warm and dry.     Nails: There is no clubbing.  Neurological:     Mental Status: He is alert and oriented to person, place, and time.  Psychiatric:        Speech: Speech normal.        Behavior: Behavior normal.        Thought Content: Thought content normal.        Judgment: Judgment normal.    Wt  Readings from Last 3 Encounters:  03/10/21 184 lb (83.5 kg)  02/19/21 180 lb 6 oz (81.8 kg)  02/06/21 182 lb 3.2 oz (82.6 kg)     ASSESSMENT & PLAN:   Exertional dyspnea: Chronic and stable.  Felt to be multifactorial including underlying CAD, HFpEF, pulmonary disease, recent COVID infection, and physical deconditioning.  He prefers no further invasive cardiac testing at this time.  Follow-up with PCP and pulmonology as directed.  CAD involving the native coronary arteries with stable angina: No symptoms concerning for progressive angina.  Recent Lexiscan MPI was low risk.  We did discuss the possibility of pursuing a diagnostic R/LHC, or just an RHC, however he prefers to avoid further testing at this time given stable symptoms.  We will continue aggressive risk factor modification and secondary prevention including clopidogrel, amlodipine, atorvastatin, isosorbide mononitrate, and metoprolol succinate.  HFpEF: He appears euvolemic and well compensated.  We did undergo a trial of Lasix 20 mg daily with noted vigorous urine output, though no symptomatic improvement.  We will obtain a BMP to ensure stable renal function and potassium.  CHF education.  Palpitations: Quiescent.  Zio patch pending.  He remains on Toprol-XL.  Probable COPD: Dyspnea is most noticeable after getting out of a hot shower into the cold air.  Follow-up with pulmonology as instructed.  HTN: Blood pressure is well controlled in the office today.  He remains on amlodipine, Imdur, Toprol, and telmisartan/HCTZ.  Low-sodium diet encouraged.  HLD: LDL 45 in 01/2020.  He remains on atorvastatin.  PAD: No symptoms of lifestyle limiting claudication.  He remains on clopidogrel and atorvastatin.  Follow-up with vascular surgery as directed.   Disposition: F/u with Dr. Saunders Revel or an APP in 6 months.   Medication Adjustments/Labs and Tests Ordered: Current medicines are reviewed at length with the patient today.  Concerns regarding  medicines are outlined above. Medication changes, Labs and Tests ordered today are summarized above and listed in the Patient Instructions accessible in Encounters.   Signed, Christell Faith, PA-C 03/10/2021 11:05 AM     [facility] [address] [place] [address] [place], [place] [address] [phone]

## 2021-03-11 LAB — BASIC METABOLIC PANEL
BUN/Creatinine Ratio: 15 (ref 10–24)
BUN: 14 mg/dL (ref 8–27)
CO2: 25 mmol/L (ref 20–29)
Calcium: 10.3 mg/dL — ABNORMAL HIGH (ref 8.6–10.2)
Chloride: 106 mmol/L (ref 96–106)
Creatinine, Ser: 0.95 mg/dL (ref 0.76–1.27)
Glucose: 131 mg/dL — ABNORMAL HIGH (ref 70–99)
Potassium: 4 mmol/L (ref 3.5–5.2)
Sodium: 146 mmol/L — ABNORMAL HIGH (ref 134–144)
eGFR: 85 mL/min/{1.73_m2} (ref >59)

## 2021-03-14 DIAGNOSIS — R002 Palpitations: Secondary | ICD-10-CM | POA: Diagnosis not present

## 2021-03-20 ENCOUNTER — Telehealth: Payer: Self-pay | Admitting: *Deleted

## 2021-03-20 NOTE — Telephone Encounter (Signed)
Left voicemail message to call back for review of monitor results.

## 2021-03-20 NOTE — Telephone Encounter (Signed)
-----   Message from Rise Mu, PA-C sent at 03/20/2021  8:55 AM EST ----- Outpatient cardiac monitoring demonstrated a predominant rhythm of sinus with an average rate of 77 bpm (range 52 to 118 bpm in sinus), rare PACs and PVCs, a single 4 beat run of NSVT occurred with a maximal rate of 193 bpm, 6 atrial runs occurred lasting up to 7 beats with a maximal rate of 139 bpm.  No sustained arrhythmias or prolonged pauses were observed.  Patient triggered events corresponded with sinus rhythm and PACs.  Recommendations: -In an effort to not overmedicate, without sustained arrhythmias noted, would continue current dose of Toprol-XL, and if symptoms become worse we could consider titration of this medication

## 2021-03-24 NOTE — Telephone Encounter (Signed)
Reviewed results and recommendations with patient. He verbalized understanding with no further questions at this time.

## 2021-03-25 ENCOUNTER — Encounter: Payer: Self-pay | Admitting: Gastroenterology

## 2021-03-25 ENCOUNTER — Telehealth: Payer: Self-pay

## 2021-03-25 ENCOUNTER — Ambulatory Visit: Payer: Medicare Other | Admitting: Gastroenterology

## 2021-03-25 VITALS — BP 120/70 | HR 84 | Ht 67.0 in | Wt 187.6 lb

## 2021-03-25 DIAGNOSIS — Z8719 Personal history of other diseases of the digestive system: Secondary | ICD-10-CM | POA: Diagnosis not present

## 2021-03-25 DIAGNOSIS — K3189 Other diseases of stomach and duodenum: Secondary | ICD-10-CM | POA: Diagnosis not present

## 2021-03-25 DIAGNOSIS — R635 Abnormal weight gain: Secondary | ICD-10-CM | POA: Insufficient documentation

## 2021-03-25 DIAGNOSIS — K319 Disease of stomach and duodenum, unspecified: Secondary | ICD-10-CM | POA: Insufficient documentation

## 2021-03-25 DIAGNOSIS — K219 Gastro-esophageal reflux disease without esophagitis: Secondary | ICD-10-CM

## 2021-03-25 HISTORY — DX: Abnormal weight gain: R63.5

## 2021-03-25 HISTORY — DX: Disease of stomach and duodenum, unspecified: K31.9

## 2021-03-25 MED ORDER — NA SULFATE-K SULFATE-MG SULF 17.5-3.13-1.6 GM/177ML PO SOLN: 1.0000 | ORAL | 0 refills | Status: DC

## 2021-03-25 MED ORDER — MESALAMINE 1.2 G PO TBEC: 2.4000 g | DELAYED_RELEASE_TABLET | Freq: Every day | ORAL | 3 refills | Status: DC

## 2021-03-25 MED ORDER — HYOSCYAMINE SULFATE ER 0.375 MG PO TB12: 0.3750 mg | ORAL_TABLET | Freq: Two times a day (BID) | ORAL | 0 refills | Status: DC | PRN

## 2021-03-25 NOTE — Progress Notes (Signed)
[place] VISIT   Primary Care Provider [name], [name], MD [place] [address] [phone]  Patient Profile: Erik Nicholson is a 70 y.o. male with a pmh significant for reported pan ulcerative colitis (dx >16 years ago), CAD (on Plavix), PAD status post stenting, hypertension, hyperlipidemia, chronic back pain, BPH, prostate cancer status post brachytherapy, prior hepatitis C (status post treatment), possible pancreatic gastric rest, penile implant, osteoarthritis.  The patient presents to the [facility] for an evaluation and management of problem(s) noted below:  Problem List 1. History of chronic ulcerative colitis   2. Gastric nodule   3. Subepithelial gastric lesion   4. Gastroesophageal reflux disease, unspecified whether esophagitis present   5. Weight gain      History of Present Illness Please see prior notes for full details of HPI.  Interval History Since seeing the patient earlier this year, he has done extremely well.  The patient actually ran out of hyoscyamine and has been doing okay without it.  He is having between 1 and 2 bowel movements per day.  No significant "flares".  He has not been on any steroids.  Rectal urgency occurs infrequently at this time.  He is not having any nocturnal wake ups to have bowel movements.  No new progressive abdominal pain.  He has gained weight over the course of the last few months but will be working on trying to get this down.  GI Review of Systems Positive as above Negative for dysphagia, odynophagia, pyrosis, alteration of bowel habits, melena, hematochezia  Review of Systems General: Denies fevers/chills/weight loss unintentionally HEENT: Denies oral lesions Cardiovascular: Denies chest pain Pulmonary: Denies shortness of breath Gastroenterological: See HPI Genitourinary: Denies darkened urine Hematological: Positive for easy bruising/bleeding due  to Plavix use Dermatological: Denies jaundice Psychological: Mood is stable   Medications Current Outpatient Medications  Medication Sig Dispense Refill   albuterol (VENTOLIN HFA) 108 (90 Base) MCG/ACT inhaler Inhale 1-2 puffs into the lungs every 6 (six) hours as needed for wheezing or shortness of breath. 18 g 2   amLODipine (NORVASC) 10 MG tablet Take 1 tablet (10 mg total) by mouth daily. 90 tablet 3   atorvastatin (LIPITOR) 40 MG tablet TAKE 1 TABLET (40 MG TOTAL) BY MOUTH DAILY. PLEASE KEEP UPCOMING SCHEDULED APPOINTMENT 90 tablet 0   clopidogrel (PLAVIX) 75 MG tablet Take 1 tablet (75 mg total) by mouth daily. 90 tablet 3   cyclobenzaprine (FLEXERIL) 5 MG tablet TAKE 1-2 TABLETS (5-10 MG TOTAL) BY MOUTH AT BEDTIME AS NEEDED FOR MUSCLE SPASMS. 60 tablet 11   hyoscyamine (LEVBID) 0.375 MG 12 hr tablet Take 1 tablet (0.375 mg total) by mouth every 12 (twelve) hours as needed. 15 tablet 0   lidocaine (LIDODERM) 5 % Place 1 patch onto the skin daily as needed (pain). Remove patch after 12 hours.     metoprolol succinate (TOPROL-XL) 100 MG 24 hr tablet TAKE 1 TABLET BY MOUTH DAILY. TAKE WITH OR IMMEDIATELY FOLLOWING A MEAL. 90 tablet 1   Na Sulfate-K Sulfate-Mg Sulf (SUPREP BOWEL PREP KIT) 17.5-3.13-1.6 GM/177ML SOLN Take 1 kit by mouth as directed. For colonoscopy prep 354 mL 0   nitroGLYCERIN (NITROSTAT) 0.4 MG SL tablet TAKE 1 TABLET BY MOUTH SUBLINGUALLY EVERY 5 MINS AS NEEDED FOR CHEST PAIN. IF >1 NEEDED, CALL 911 75 tablet 4   pantoprazole (PROTONIX) 40 MG tablet Take 1 tablet (40 mg total) by mouth daily. 30 tablet 1   RESTASIS 0.05 %  ophthalmic emulsion Place 1 drop into both eyes 2 (two) times daily.      telmisartan-hydrochlorothiazide (MICARDIS HCT) 80-12.5 MG tablet Take 1 tablet by mouth daily. In am d/c 40-12.5 mg dose 90 tablet 3   mesalamine (LIALDA) 1.2 g EC tablet Take 2 tablets (2.4 g total) by mouth daily with breakfast. 180 tablet 3   No current facility-administered  medications for this visit.    Allergies Allergies  Allergen Reactions   Cymbalta [Duloxetine Hcl] Other (See Comments)    drowsiness    Histories Past Medical History:  Diagnosis Date   Anemia    Arthritis    BPH (benign prostatic hyperplasia)    CAD (coronary artery disease)    x 7 cardiac stents   Chronic back pain    L4/5 with chronic right leg pain    COVID-19    10/20/20   [place], vitreous, left    Hx of hepatitis C    treated with Harvoni in 2014   Hyperlipidemia    Hypertension    Neuromuscular disorder ([place])    nerve damage in back   Neuropathy    Prostate cancer ([place])    PVD (peripheral vascular disease) ([place])    2 stents right leg and 1 in left    Ulcerative colitis ([place])    with diarrhea   Past Surgical History:  Procedure Laterality Date   back surgery     x 2 lumbar last 09/2016 in [place], BILATERAL     COLONOSCOPY     last 09/2018 in [place] N/A 03/21/2020   Procedure: [place];  Surgeon: Nelva Bush, MD;  Location: [facility];  Service: Cardiovascular;  Laterality: N/A;   HERNIA REPAIR     right    INTRAVASCULAR PRESSURE WIRE/FFR STUDY N/A 03/21/2020   Procedure: INTRAVASCULAR PRESSURE WIRE/FFR STUDY;  Surgeon: Nelva Bush, MD;  Location: [facility];  Service: Cardiovascular;  Laterality: N/A;   LEFT HEART CATH AND CORONARY ANGIOGRAPHY N/A 03/21/2020   Procedure: LEFT HEART CATH AND CORONARY ANGIOGRAPHY;  Surgeon: Nelva Bush, MD;  Location: [facility];  Service: Cardiovascular;  Laterality: N/A;   PENILE PROSTHESIS IMPLANT     PERCUTANEOUS CORONARY STENT INTERVENTION (PCI-S)     x 7-8 heart   PERIPHERAL ARTERIAL STENT GRAFT     PROSTATE BIOPSY     pvd     with stenting x 2 right leg below knee and 1 left thigh    SHOULDER ARTHROSCOPY Bilateral    VITRECTOMY     right eye   Social History    Socioeconomic History   Marital status: Married    Spouse name: Not on file   Number of children: 2   Years of education: Not on file   Highest education level: Not on file  Occupational History   Not on file  Tobacco Use   Smoking status: Former    Packs/day: 2.00    Years: 40.00    Pack years: 80.00    Types: Cigarettes    Quit date: 04/20/1998    Years since quitting: 22.9   Smokeless tobacco: Never   Tobacco comments:    quit in 2000s   Vaping Use   Vaping Use: Never used  Substance and Sexual Activity   Alcohol use: Yes    Comment: occasional   Drug use: Not Currently   Sexual  activity: Yes  Other Topics Concern   Not on file  Social History Narrative   Moved from [place] in 10/2018   2 sons in 65s as of 11/24/2018    Married wife is DPR Pamala Hurry x 45 years as of 04/2019   Social Determinants of Health   Financial Resource Strain: Low Risk    Difficulty of Paying Living Expenses: Not hard at all  Food Insecurity: No Food Insecurity   Worried About Charity fundraiser in the Last Year: Never true   Arboriculturist in the Last Year: Never true  Transportation Needs: No Transportation Needs   Lack of Transportation (Medical): No   Lack of Transportation (Non-Medical): No  Physical Activity: Not on file  Stress: No Stress Concern Present   Feeling of Stress : Not at all  Social Connections: Unknown   Frequency of Communication with Friends and Family: Not on file   Frequency of Social Gatherings with Friends and Family: Not on file   Attends Religious Services: Not on Electrical engineer or Organizations: Not on file   Attends Archivist Meetings: Not on file   Marital Status: Married  Human resources officer Violence: Not At Risk   Fear of Current or Ex-Partner: No   Emotionally Abused: No   Physically Abused: No   Sexually Abused: No   Family History  Problem Relation Age of Onset   Heart disease Mother    Stroke Mother    Breast  cancer Mother    Diabetes Sister    Diabetes Brother    Colon cancer Neg Hx    Esophageal cancer Neg Hx    Inflammatory bowel disease Neg Hx    Liver disease Neg Hx    Pancreatic cancer Neg Hx    Rectal cancer Neg Hx    Stomach cancer Neg Hx    I have reviewed his medical, social, and family history in detail and updated the electronic medical record as necessary.    PHYSICAL EXAMINATION  BP 120/70   Pulse 84   Ht 5' 7"  (1.702 m)   Wt 187 lb 9.6 oz (85.1 kg)   BMI 29.38 kg/m  Wt Readings from Last 3 Encounters:  03/25/21 187 lb 9.6 oz (85.1 kg)  03/10/21 184 lb (83.5 kg)  02/19/21 180 lb 6 oz (81.8 kg)  GEN: NAD, appears stated age, doesn't appear chronically ill PSYCH: Cooperative, without pressured speech EYE: Conjunctivae pink, sclerae anicteric ENT: MMM, no oral lesions noted CV: Nontachycardic RESP: No audible wheezing GI: NABS, soft, NT/ND, without rebound or guarding MSK/EXT: No lower extremity edema SKIN: No jaundice NEURO:  Alert & Oriented x 3, no focal deficits   REVIEW OF DATA  I reviewed the following data at the time of this encounter:  GI Procedures and Studies  No new imaging studies to review  Laboratory Studies  Reviewed those in epic  Imaging Studies  No new imaging studies to review   ASSESSMENT  Erik Nicholson is a 70 y.o. male with a pmh significant for reported pan ulcerative colitis (dx >16 years ago), CAD (on Plavix), PAD status post stenting, hypertension, hyperlipidemia, chronic back pain, BPH, prostate cancer status post brachytherapy, prior hepatitis C (status post treatment), possible pancreatic gastric rest, penile implant, osteoarthritis.  The patient is seen today for evaluation and management of:  1. History of chronic ulcerative colitis   2. Gastric nodule   3. Subepithelial gastric lesion  4. Gastroesophageal reflux disease, unspecified whether esophagitis present   5. Weight gain    The patient is hemodynamically and  clinically stable.  He is doing well on his Lialda therapy.  The patient will be due for colonoscopy for surveillance in 2023.  We will plan that to be done in conjunction with a endoscopic ultrasound to evaluate the gastric subepithelial lesion.  There is previous thought that this was a potential pancreatic rest.  We will ensure that there is no evidence of a true subepithelial lesion and then decide on follow-up that may be required from there.  We will need to get approval for Plavix hold x5 days prior to procedures.  The risks of an EUS including intestinal perforation, bleeding, infection, aspiration, and medication effects were discussed as was the possibility it may not give a definitive diagnosis if a biopsy is performed.  When a biopsy of the pancreas is done as part of the EUS, there is an additional risk of pancreatitis at the rate of about 1-2%.  It was explained that procedure related pancreatitis is typically mild, although it can be severe and even life threatening, which is why we do not perform random pancreatic biopsies and only biopsy a lesion/area we feel is concerning enough to warrant the risk.  The risks and benefits of endoscopic evaluation were discussed with the patient; these include but are not limited to the risk of perforation, infection, bleeding, missed lesions, lack of diagnosis, severe illness requiring hospitalization, as well as anesthesia and sedation related illnesses.  The patient and/or family is agreeable to proceed.  We will give the patient a prescription of Levbid that he may use on an as-needed basis but hopefully he will not need to restart it significantly.  All patient questions were answered to the best of my ability, and the patient agrees to the aforementioned plan of action with follow-up as indicated.     PLAN  Obtain fecal calprotectin when able Proceed with scheduling endoscopic ultrasound to further evaluate subepithelial lesion Proceed with scheduling  colonoscopy for surveillance IBD -We will obtain approval for Plavix hold 5 days prior to procedure Continue Lialda 2.4 g daily Refilled hyoscyamine to be used as needed   Orders Placed This Encounter  Procedures   Procedural/ Surgical Case Request: ESOPHAGOGASTRODUODENOSCOPY (EGD) WITH PROPOFOL, COLONOSCOPY WITH PROPOFOL, UPPER ESOPHAGEAL ENDOSCOPIC ULTRASOUND (EUS)   Calprotectin, Fecal   Ambulatory referral to Gastroenterology     New Prescriptions   HYOSCYAMINE (LEVBID) 0.375 MG 12 HR TABLET    Take 1 tablet (0.375 mg total) by mouth every 12 (twelve) hours as needed.   NA SULFATE-K SULFATE-MG SULF (SUPREP BOWEL PREP KIT) 17.5-3.13-1.6 GM/177ML SOLN    Take 1 kit by mouth as directed. For colonoscopy prep   Modified Medications   Modified Medication Previous Medication   MESALAMINE (LIALDA) 1.2 G EC TABLET mesalamine (LIALDA) 1.2 g EC tablet      Take 2 tablets (2.4 g total) by mouth daily with breakfast.    Take 2 tablets (2.4 g total) by mouth daily with breakfast.    Planned Follow Up No follow-ups on file.  Total Time in Face-to-Face and in Coordination of Care for patient including independent/personal interpretation/review of prior testing, medical history, examination, medication adjustment, communicating results with the patient directly, and documentation with the EHR is 25 minutes.   Justice Britain, MD [facility]oscopy Office # [phone]

## 2021-03-25 NOTE — Telephone Encounter (Signed)
Request for surgical clearance:     Endoscopy Procedure  What type of surgery is being performed?     Colonoscopy, EGD, and EUS   When is this surgery scheduled?     06/02/2021  What type of clearance is required ?   Pharmacy  Are there any medications that need to be held prior to surgery and how long? Plavix x5 days prior to procedure.  Practice name and name of physician performing surgery?      [facility]  What is your office phone and fax number?      Phone- [phone]  [phone]  Anesthesia type (None, local, MAC, general) ?       MAC

## 2021-03-25 NOTE — Patient Instructions (Addendum)
We have sent the following medications to your pharmacy for you to pick up at your convenience: [place], [place], [place] provider has requested that you go to the basement level for lab work before leaving today. Press "B" on the elevator. The lab is located at the first door on the left as you exit the elevator.   You have been scheduled for an endoscopy and colonoscopy. Please follow the written instructions given to you at your visit today. Please pick up your prep supplies at the pharmacy within the next 1-3 days. If you use inhalers (even only as needed), please bring them with you on the day of your procedure.  You will be contaced by our office prior to your procedure for directions on holding your Plavix.  If you do not hear from our office 1 week prior to your scheduled procedure, please call [phone] to discuss.   If you are age 63 or older, your body mass index should be between 23-30. Your Body mass index is 29.38 kg/m. If this is out of the aforementioned range listed, please consider follow up with your Primary Care Provider.  ________________________________________________________  The [facility] providers would like to encourage you to use [facility] to communicate with providers for non-urgent requests or questions.  Due to long hold times on the telephone, sending your provider a message by [facility] may be a faster and more efficient way to get a response.  Please allow 48 business hours for a response.  Please remember that this is for non-urgent requests.   Thank you for choosing me and [facility].  Dr. Rush Landmark

## 2021-03-26 NOTE — Telephone Encounter (Signed)
   Name: Erik Nicholson  DOB: 04-21-1950  MRN: [id number]   Primary Cardiologist: Nelva Bush, MD  Chart reviewed as part of pre-operative protocol coverage. We are asked for guidance to hold plavix.   Per Dr. Saunders Revel: Given that [name] is 12 months out from PTCA to in-stent restenosis of the RCA, I think it is reasonable to hold clopidogrel for 5 days before planned endoscopic procedures and to restart it when it is felt safe to do so by his gastroenterologist.  I will route this recommendation to the requesting party via [facility] fax function and remove from pre-op pool. Please call with questions.  [place], PA 03/26/2021, 3:02 PM

## 2021-03-26 NOTE — Telephone Encounter (Signed)
Dr. Saunders Revel Pt has a history of CAD and PAD, with most recent intervention 03/2020 with PTCA of ISR of distal RCA. We are asked to hold plavix 5 day sprior to colonoscopy, EGD, and EUS. OK to hold?

## 2021-03-26 NOTE — Telephone Encounter (Signed)
Given that Mr. Edger Husain is 12 months out from PTCA to in-stent restenosis of the RCA, I think it is reasonable to hold clopidogrel for 5 days before planned endoscopic procedures and to restart it when it is felt safe to do so by his gastroenterologist.  Nelva Bush, MD [place]

## 2021-03-31 NOTE — Telephone Encounter (Signed)
Patient has been informed, Okay to hold Plavix x5 days prior to procedure. Pt voiced understanding.

## 2021-04-23 ENCOUNTER — Telehealth: Payer: Self-pay | Admitting: Internal Medicine

## 2021-04-23 NOTE — Telephone Encounter (Signed)
Patient called in having back pain  and I schedule appt   for 1/11 but  patient need some pain medication,and  wanted to see if the Dr can be prescribe something. Please call patient @ [phone]

## 2021-04-23 NOTE — Telephone Encounter (Signed)
Please advise, have you seen Patient for back pain before or does he need to await his appointment for 04/30/21?

## 2021-04-24 ENCOUNTER — Other Ambulatory Visit: Payer: Self-pay | Admitting: Family

## 2021-04-25 ENCOUNTER — Other Ambulatory Visit: Payer: Self-pay | Admitting: Internal Medicine

## 2021-04-25 DIAGNOSIS — M25512 Pain in left shoulder: Secondary | ICD-10-CM

## 2021-04-25 DIAGNOSIS — G8929 Other chronic pain: Secondary | ICD-10-CM

## 2021-04-25 MED ORDER — OXYCODONE-ACETAMINOPHEN 5-325 MG PO TABS: 1.0000 | ORAL_TABLET | Freq: Two times a day (BID) | ORAL | 0 refills | Status: DC | PRN

## 2021-04-25 NOTE — Telephone Encounter (Signed)
Sent percocet  Does pt need referral to pain clinic again?

## 2021-04-28 NOTE — Telephone Encounter (Signed)
Will discuss at Patient's 04/30/20 appointment

## 2021-04-30 ENCOUNTER — Encounter: Payer: Self-pay | Admitting: Internal Medicine

## 2021-04-30 ENCOUNTER — Ambulatory Visit (INDEPENDENT_AMBULATORY_CARE_PROVIDER_SITE_OTHER): Payer: Medicare Other | Admitting: Internal Medicine

## 2021-04-30 ENCOUNTER — Other Ambulatory Visit: Payer: Self-pay

## 2021-04-30 ENCOUNTER — Other Ambulatory Visit: Payer: Self-pay | Admitting: Internal Medicine

## 2021-04-30 VITALS — BP 110/68 | HR 72 | Temp 97.5°F | Ht 67.0 in | Wt 189.2 lb

## 2021-04-30 DIAGNOSIS — R269 Unspecified abnormalities of gait and mobility: Secondary | ICD-10-CM

## 2021-04-30 DIAGNOSIS — M5416 Radiculopathy, lumbar region: Secondary | ICD-10-CM | POA: Insufficient documentation

## 2021-04-30 DIAGNOSIS — M549 Dorsalgia, unspecified: Secondary | ICD-10-CM

## 2021-04-30 DIAGNOSIS — Z7689 Persons encountering health services in other specified circumstances: Secondary | ICD-10-CM

## 2021-04-30 DIAGNOSIS — R600 Localized edema: Secondary | ICD-10-CM

## 2021-04-30 DIAGNOSIS — M25531 Pain in right wrist: Secondary | ICD-10-CM

## 2021-04-30 DIAGNOSIS — M25551 Pain in right hip: Secondary | ICD-10-CM | POA: Diagnosis not present

## 2021-04-30 DIAGNOSIS — E876 Hypokalemia: Secondary | ICD-10-CM

## 2021-04-30 DIAGNOSIS — R140 Abdominal distension (gaseous): Secondary | ICD-10-CM

## 2021-04-30 DIAGNOSIS — R29898 Other symptoms and signs involving the musculoskeletal system: Secondary | ICD-10-CM

## 2021-04-30 HISTORY — DX: Unspecified abnormalities of gait and mobility: R26.9

## 2021-04-30 HISTORY — DX: Pain in right wrist: M25.531

## 2021-04-30 HISTORY — DX: Dorsalgia, unspecified: M54.9

## 2021-04-30 HISTORY — DX: Localized edema: R60.0

## 2021-04-30 HISTORY — DX: Radiculopathy, lumbar region: M54.16

## 2021-04-30 HISTORY — DX: Other symptoms and signs involving the musculoskeletal system: R29.898

## 2021-04-30 HISTORY — DX: Pain in right hip: M25.551

## 2021-04-30 LAB — CBC WITH DIFFERENTIAL/PLATELET
Basophils Absolute: 0 10*3/uL (ref 0.0–0.1)
Basophils Relative: 0.5 % (ref 0.0–3.0)
Eosinophils Absolute: 0.2 10*3/uL (ref 0.0–0.7)
Eosinophils Relative: 3.6 % (ref 0.0–5.0)
HCT: 35.6 % — ABNORMAL LOW (ref 39.0–52.0)
Hemoglobin: 11.8 g/dL — ABNORMAL LOW (ref 13.0–17.0)
Lymphocytes Relative: 27.6 % (ref 12.0–46.0)
Lymphs Abs: 1.6 10*3/uL (ref 0.7–4.0)
MCHC: 33.1 g/dL (ref 30.0–36.0)
MCV: 98.4 fl (ref 78.0–100.0)
Monocytes Absolute: 0.7 10*3/uL (ref 0.1–1.0)
Monocytes Relative: 12.7 % — ABNORMAL HIGH (ref 3.0–12.0)
Neutro Abs: 3.2 10*3/uL (ref 1.4–7.7)
Neutrophils Relative %: 55.6 % (ref 43.0–77.0)
Platelets: 224 10*3/uL (ref 150.0–400.0)
RBC: 3.62 Mil/uL — ABNORMAL LOW (ref 4.22–5.81)
RDW: 13.4 % (ref 11.5–15.5)
WBC: 5.7 10*3/uL (ref 4.0–10.5)

## 2021-04-30 LAB — COMPREHENSIVE METABOLIC PANEL
ALT: 24 U/L (ref 0–53)
AST: 27 U/L (ref 0–37)
Albumin: 4.3 g/dL (ref 3.5–5.2)
Alkaline Phosphatase: 65 U/L (ref 39–117)
BUN: 12 mg/dL (ref 6–23)
CO2: 30 mEq/L (ref 19–32)
Calcium: 9.8 mg/dL (ref 8.4–10.5)
Chloride: 104 mEq/L (ref 96–112)
Creatinine, Ser: 0.91 mg/dL (ref 0.40–1.50)
GFR: 84.14 mL/min (ref >60.00)
Glucose, Bld: 98 mg/dL (ref 70–99)
Potassium: 3.3 mEq/L — ABNORMAL LOW (ref 3.5–5.1)
Sodium: 140 mEq/L (ref 135–145)
Total Bilirubin: 0.8 mg/dL (ref 0.2–1.2)
Total Protein: 7.8 g/dL (ref 6.0–8.3)

## 2021-04-30 LAB — BRAIN NATRIURETIC PEPTIDE: Pro B Natriuretic peptide (BNP): 29 pg/mL (ref 0.0–100.0)

## 2021-04-30 LAB — TSH: TSH: 1.01 u[IU]/mL (ref 0.35–5.50)

## 2021-04-30 LAB — D-DIMER, QUANTITATIVE: D-Dimer, Quant: 0.36 mcg/mL FEU (ref <0.50)

## 2021-04-30 MED ORDER — METHYLPREDNISOLONE 4 MG PO TBPK: ORAL_TABLET | ORAL | 0 refills | Status: DC

## 2021-04-30 MED ORDER — POTASSIUM CHLORIDE CRYS ER 20 MEQ PO TBCR: 20.0000 meq | EXTENDED_RELEASE_TABLET | Freq: Every day | ORAL | 0 refills | Status: DC

## 2021-04-30 NOTE — Progress Notes (Addendum)
Chief Complaint  Patient presents with   Back Pain   Mobility assessment and back pain flare  1. Leg edema b/l to thighs and abdomen new and weight gain + pt thinks about 20 lbs he is drinking about 80 ounces of water daily prior echo nl ef >50% but grade 1 DD and given lasix 20 mg qd prn 03/10/21 cardiology visit also for htn on norvasc 10 mg qd, toprol 100 mg xl qd and micardis 80-12.5 mg qd   2. Acute on chronic back pain and trouble walking/C/o gait problem (I.e short distances) trouble sitting turning standing due to low back pain  10/10 and h/o back surgery. We'd given him temporary supply of percocet 5-325 mg bid prn and back pain flare is acute on chronic. He has cane using it today, has used rollator, manual wheelchair but wants motorized wheelchair. He has handicap plates on his cars He is established with [facility] D.[facility] Lacinda Axon who in 2021 wanted to order MRI T spine Low back pain radiates to right hip and legs and has b/l leg weakness. He is unable to toss and turn like he normally does in bed and right hip feels like needle sticking in it and sharp pain He has handicap sticker  mRI abnormal 04/2019 (lumbar) IMPRESSION: 1. Prior posterior and interbody fusion at L3-L5. No evidence of residual foraminal or canal stenosis at these levels. 2. Adjacent segment disease at L2-3 with mild canal stenosis and mild-to-moderate bilateral foraminal stenosis. 3. Diffuse marrow heterogeneity without discrete marrow replacing lesion. Findings are nonspecific but can be seen in the setting of chronic anemia, smoking, and/or obesity. 4. Intramuscular edema within the posterior paraspinal muscles of the lower lumbar spine, is nonspecific and could be secondary to muscular strain. 5. Small simple appearing postoperative fluid collection posterior to the L3-4 and L4-5 levels, most likely representing a small seroma.     Electronically Signed   By: Davina Poke D.O.   On: 05/07/2019 13:23  3. He c/o  abdominal swelling [place] in the past negative  4. C/o right wrist pain s/p steroid injection prior fall and injury saw [facility] in [place] declines to see them again and wants 2nd opinion has wrist splints pain with ROM right wrist and limited ROM  IMPRESSION: Soft tissue calcifications adjacent to the scaphoid and lunate within the scapholunate interval are likely sequela of age indeterminate trauma. Recommend clinical correlation.     Electronically Signed   By: Dorise Bullion III M.D.   On: 02/07/2021 15:09  Review of Systems  Constitutional:  Negative for weight loss.  HENT:  Negative for hearing loss.   Eyes:  Negative for blurred vision.  Respiratory:  Negative for shortness of breath.   Cardiovascular:  Positive for leg swelling. Negative for chest pain.  Gastrointestinal:  Negative for abdominal pain and blood in stool.  Musculoskeletal:  Positive for back pain.  Skin:  Negative for rash.  Neurological:  Positive for weakness. Negative for headaches.  Psychiatric/Behavioral:  Negative for depression.   Past Medical History:  Diagnosis Date   Anemia    Arthritis    BPH (benign prostatic hyperplasia)    CAD (coronary artery disease)    x 7 cardiac stents   Chronic back pain    L4/5 with chronic right leg pain    COVID-19    10/20/20   [place], vitreous, left    Hx of hepatitis C    treated with Harvoni in 2014   Hyperlipidemia  Hypertension    Neuromuscular disorder ([place])    nerve damage in back   Neuropathy    Prostate cancer ([place])    PVD (peripheral vascular disease) ([place])    2 stents right leg and 1 in left    Ulcerative colitis ([place])    with diarrhea   Past Surgical History:  Procedure Laterality Date   back surgery     x 2 lumbar last 09/2016 in [place], BILATERAL     COLONOSCOPY     last 09/2018 in [place] N/A 03/21/2020   Procedure: [place];  Surgeon: Nelva Bush, MD;  Location: [facility];  Service: Cardiovascular;  Laterality: N/A;   HERNIA REPAIR     right    INTRAVASCULAR PRESSURE WIRE/FFR STUDY N/A 03/21/2020   Procedure: INTRAVASCULAR PRESSURE WIRE/FFR STUDY;  Surgeon: Nelva Bush, MD;  Location: [facility];  Service: Cardiovascular;  Laterality: N/A;   LEFT HEART CATH AND CORONARY ANGIOGRAPHY N/A 03/21/2020   Procedure: LEFT HEART CATH AND CORONARY ANGIOGRAPHY;  Surgeon: Nelva Bush, MD;  Location: [facility];  Service: Cardiovascular;  Laterality: N/A;   PENILE PROSTHESIS IMPLANT     PERCUTANEOUS CORONARY STENT INTERVENTION (PCI-S)     x 7-8 heart   PERIPHERAL ARTERIAL STENT GRAFT     PROSTATE BIOPSY     pvd     with stenting x 2 right leg below knee and 1 left thigh    SHOULDER ARTHROSCOPY Bilateral    VITRECTOMY     right eye   Family History  Problem Relation Age of Onset   Heart disease Mother    Stroke Mother    Breast cancer Mother    Diabetes Sister    Diabetes Brother    Colon cancer Neg Hx    Esophageal cancer Neg Hx    Inflammatory bowel disease Neg Hx    Liver disease Neg Hx    Pancreatic cancer Neg Hx    Rectal cancer Neg Hx    Stomach cancer Neg Hx    Social History   Socioeconomic History   Marital status: Married    Spouse name: Not on file   Number of children: 2   Years of education: Not on file   Highest education level: Not on file  Occupational History   Not on file  Tobacco Use   Smoking status: Former    Packs/day: 2.00    Years: 40.00    Pack years: 80.00    Types: Cigarettes    Quit date: 04/20/1998    Years since quitting: 23.0   Smokeless tobacco: Never   Tobacco comments:    quit in 2000s   Vaping Use   Vaping Use: Never used  Substance and Sexual Activity   Alcohol use: Yes    Comment: occasional   Drug use: Not Currently   Sexual activity: Yes  Other Topics Concern   Not on file  Social History  Narrative   Moved from [place] in 10/2018   2 sons in 7s as of 11/24/2018    Married wife is DPR Pamala Hurry x 45 years as of 04/2019   Social Determinants of Health   Financial Resource Strain: Low Risk    Difficulty of Paying Living Expenses: Not hard at all  Food Insecurity: No Food Insecurity   Worried About Charity fundraiser in the Last  Year: Never true   [place] in the Last Year: Never true  Transportation Needs: No Transportation Needs   Lack of Transportation (Medical): No   Lack of Transportation (Non-Medical): No  Physical Activity: Not on file  Stress: No Stress Concern Present   Feeling of Stress : Not at all  Social Connections: Unknown   Frequency of Communication with Friends and Family: Not on file   Frequency of Social Gatherings with Friends and Family: Not on file   Attends Religious Services: Not on file   Active Member of Clubs or Organizations: Not on file   Attends Archivist Meetings: Not on file   Marital Status: Married  Human resources officer Violence: Not At Risk   Fear of Current or Ex-Partner: No   Emotionally Abused: No   Physically Abused: No   Sexually Abused: No   Current Meds  Medication Sig   amLODipine (NORVASC) 10 MG tablet Take 1 tablet (10 mg total) by mouth daily.   atorvastatin (LIPITOR) 40 MG tablet TAKE 1 TABLET (40 MG TOTAL) BY MOUTH DAILY. PLEASE KEEP UPCOMING SCHEDULED APPOINTMENT   clopidogrel (PLAVIX) 75 MG tablet Take 1 tablet (75 mg total) by mouth daily.   cyclobenzaprine (FLEXERIL) 5 MG tablet TAKE 1-2 TABLETS (5-10 MG TOTAL) BY MOUTH AT BEDTIME AS NEEDED FOR MUSCLE SPASMS.   furosemide (LASIX) 20 MG tablet Take 20 mg by mouth daily as needed.   hyoscyamine (LEVBID) 0.375 MG 12 hr tablet Take 1 tablet (0.375 mg total) by mouth every 12 (twelve) hours as needed.   lidocaine (LIDODERM) 5 % Place 1 patch onto the skin daily as needed (pain). Remove patch after 12 hours.   mesalamine (LIALDA) 1.2 g EC tablet Take  2 tablets (2.4 g total) by mouth daily with breakfast.   methylPREDNISolone (MEDROL DOSEPAK) 4 MG TBPK tablet As directed   metoprolol succinate (TOPROL-XL) 100 MG 24 hr tablet TAKE 1 TABLET BY MOUTH DAILY. TAKE WITH OR IMMEDIATELY FOLLOWING A MEAL.   Na Sulfate-K Sulfate-Mg Sulf (SUPREP BOWEL PREP KIT) 17.5-3.13-1.6 GM/177ML SOLN Take 1 kit by mouth as directed. For colonoscopy prep   nitroGLYCERIN (NITROSTAT) 0.4 MG SL tablet TAKE 1 TABLET BY MOUTH SUBLINGUALLY EVERY 5 MINS AS NEEDED FOR CHEST PAIN. IF >1 NEEDED, CALL 911   oxyCODONE-acetaminophen (PERCOCET) 5-325 MG tablet Take 1 tablet by mouth 2 (two) times daily as needed for severe pain.   pantoprazole (PROTONIX) 40 MG tablet Take 1 tablet (40 mg total) by mouth daily.   RESTASIS 0.05 % ophthalmic emulsion Place 1 drop into both eyes 2 (two) times daily.    telmisartan-hydrochlorothiazide (MICARDIS HCT) 80-12.5 MG tablet Take 1 tablet by mouth daily. In am d/c 40-12.5 mg dose   Allergies  Allergen Reactions   Cymbalta [Duloxetine Hcl] Other (See Comments)    drowsiness   Recent Results (from the past 2160 hour(s))  Basic Metabolic Panel (BMET)     Status: Abnormal   Collection Time: 03/10/21 10:41 AM  Result Value Ref Range   Glucose 131 (H) 70 - 99 mg/dL   BUN 14 8 - 27 mg/dL   Creatinine, Ser 0.95 0.76 - 1.27 mg/dL   eGFR 85 >59 mL/min/1.73   BUN/Creatinine Ratio 15 10 - 24   Sodium 146 (H) 134 - 144 mmol/L   Potassium 4.0 3.5 - 5.2 mmol/L   Chloride 106 96 - 106 mmol/L   CO2 25 20 - 29 mmol/L   Calcium 10.3 (H) 8.6 - 10.2  mg/dL   Objective  Body mass index is 29.63 kg/m. Wt Readings from Last 3 Encounters:  04/30/21 189 lb 3.2 oz (85.8 kg)  03/25/21 187 lb 9.6 oz (85.1 kg)  03/10/21 184 lb (83.5 kg)   Temp Readings from Last 3 Encounters:  04/30/21 (!) 97.5 F (36.4 C) (Temporal)  02/06/21 98.7 F (37.1 C) (Oral)  10/31/20 99.1 F (37.3 C) (Oral)   BP Readings from Last 3 Encounters:  04/30/21 110/68   03/25/21 120/70  03/10/21 130/80   Pulse Readings from Last 3 Encounters:  04/30/21 72  03/25/21 84  03/10/21 74    Physical Exam Vitals and nursing note reviewed.  Constitutional:      Appearance: Normal appearance. He is well-developed and well-groomed. He is obese.  HENT:     Head: Normocephalic and atraumatic.  Eyes:     Conjunctiva/sclera: Conjunctivae normal.     Pupils: Pupils are equal, round, and reactive to light.  Cardiovascular:     Rate and Rhythm: Normal rate and regular rhythm.     Heart sounds: Normal heart sounds.  Pulmonary:     Effort: Pulmonary effort is normal. No respiratory distress.     Breath sounds: Normal breath sounds.  Abdominal:     Tenderness: There is no abdominal tenderness.  Musculoskeletal:     Lumbar back: Tenderness present. Decreased range of motion. Negative right straight leg raise test and negative left straight leg raise test.       Back:     Right lower leg: 2+ Pitting Edema present.     Left lower leg: 2+ Pitting Edema present.  Skin:    General: Skin is warm and moist.  Neurological:     General: No focal deficit present.     Mental Status: He is alert and oriented to person, place, and time. Mental status is at baseline.     Cranial Nerves: Cranial nerves 2-12 are intact.     Sensory: Sensation is intact.     Motor: Weakness present.     Coordination: Coordination is intact.     Gait: Gait abnormal.     Comments: Walking with cane today  5/5 strength upper both b/l  And 3+/5 strength b/l lower legs    Psychiatric:        Attention and Perception: Attention and perception normal.        Mood and Affect: Mood and affect normal.        Speech: Speech normal.        Behavior: Behavior normal. Behavior is cooperative.        Thought Content: Thought content normal.        Cognition and Memory: Cognition and memory normal.        Judgment: Judgment normal.    Assessment  Plan  Encounter for power mobility device  assessment  Power Mobility evaluation device assessment (device and batteries) due to chronic back pain abnormal MRI lumbar s/p back surgery leg weakness and health decline.    Patient has mobility limitations which impairs his ability to perform ADLs and IADLS at home and outside of the home (I.e ). grocery store shopping with a lot of walking, activities outside of the home walking more than 15-20 feet. He has already tried to use a cane and manual wheelchair outside of the home and these do not help him.    Power mobility device and batteries are necessary (ELECTRIC COMPACT MOTORIZED WHEELCHAIR WITH BATTERIES). He is already using a rollator  with brakes and seat and has used a cane with some difficulty walking and due to chronic right wrist pain and leg weakness bilaterally is at risk of falls. This patient is unable to use a manual wheelchair due to chronic pain limited ROM mobility with right wrist and electric wheelchair with batteries will be better Patient needs PMD for out of the home activities as he has trouble walking due to leg edema and weakness with impairs mobility. He has used a manual wheelchair and this is not helpful   Normal ROM upper and lower extremities bilateral except for right wrist chronic pain with ROM and limited ROM by almost 50% after prior fall referral placed to ortho.    Strength see physical exam   A walking device will not resolve the issue as he is currently using a walking device I.e cane An electric wheelchair will allow the patient to safely perform daily activities and IADLs.    He does propel herself at home with a rollator/cane most of the time but this is difficult due to the above mentioned. He is currently using a cane today and has a handicap plate tag.   He also has balance issues with walking , bilateral leg weakness and is at risk for falls so a cane or walker/rollator will not medically meet my patients needs in the home    He is currently  using a cane/manual wheelchair longer distances and rollator and he is unable due to chronic pain right wrist and lower back to fully execute and perform ADLS and IADLS. His gait is unable and slow.    Please approve a power mobility device (power wheelchair) with batteries. He prefers a compact power device which you can move the battery and handles and it folds in more compact    A scooter would be of concern due to balance issues.     The patient can safely operate the power mobility device both mentally and physically and he is willing and motivated to use the power mobility device in the home.   The patient will be able to safely transfer to and from a power mobility device w/in 5-10 minutes.    The patient is currently using a cane and a rollator with seat and brakes due to mobility/ balance, chronic pain  issues. Her upper body (I.le right wrist) and lower body (I.e bilateral legs) use is limited due to musculoskeletal pain which have chronically flare.   Unable to propel manual wheelchair due to reduced ROM and strength in right wrist due to prior injury    The distance moved rollator and or cane is 10 feet before stopping to rest and projected distance with self propulsion or manual wheelchair is 5 to 10 feet but he has chronic right wrist pain and mobility issues after a fall months ago    Abnormal gait Lumbar radiculopathy, right s/p back surgery and abnormal MRI 04/2019- Plan: Ambulatory referral to Neurosurgery Dr. Lacinda Axon, methylPREDNISolone (MEDROL DOSEPAK) 4 MG TBPK tablet,  MRI T and L spine ordered Pt declines PT for now   Mid back pain - Plan: methylPREDNISolone (MEDROL DOSEPAK) 4 MG TBPK tablet, MR Thoracic Spine Wo Contrast, MR Lumbar Spine Wo Contrast, CBC with Differential/Platelet, CANCELED: MR Lumbar Spine Wo Contrast  Weakness of both lower extremities - Plan: MR Thoracic Spine Wo Contrast, MR Lumbar Spine Wo Contrast  Right hip pain - Plan: DG Hip Unilat W OR W/O  Pelvis 2-3 Views Right, methylPREDNISolone (MEDROL DOSEPAK) 4  MG TBPK tablet, CBC with Differential/Platelet  Right wrist pain - Plan: methylPREDNISolone (MEDROL DOSEPAK) 4 MG TBPK tablet Ambulatory referral to Orthopedic Surgery Dr. Amedeo Plenty in [place]   Leg edema - Plan: Comprehensive metabolic panel, D-Dimer, Quantitative, B Nat Peptide, TSH, CBC with Differential/Platelet Consider reduce norvasc to 5 mg qd due to leg swelling with 10 mg qd Has prn lasix 20 mg qd prn also in micardis hctz 80-12.5 mg qd Advised reduced fluids from 80 oz to 60 oz with nl ef but grade 1 dd echo from 2022   Abdominal bloating  Consider CT ab/pelvis in the future   HM  See prior   Time spent 30 + minutes coordinating care and mobility assessment  Provider: Dr. Olivia Mackie [name]-[facility] Medicine

## 2021-04-30 NOTE — Patient Instructions (Addendum)
Neurosurgery  Dr. Lacinda Axon or Dr. Cari Caraway   Phone Fax E-mail Address  [phone] Not available [place] [address]     Specialties     Neurosurgery      Dr. Nicola Police ortho  Phone Fax E-mail Address  [phone] Not available [address]   [place]   [place] [address]     Specialties     Orthopedic Surgery

## 2021-05-01 IMAGING — MR MR LUMBAR SPINE W/O CM
5 series · 31 of 48 positions shown · non-contrast
Comparison: None.

CLINICAL DATA: Low back pain with right leg weakness. History of
prior lumbar surgery 8282

EXAM:
MRI LUMBAR SPINE WITHOUT CONTRAST
TECHNIQUE: Multiplanar, multisequence MR imaging of the lumbar spine was
performed. No intravenous contrast was administered.

[Series 5: T2 · sagittal · 4.0mm · 0.81mm/px · 6 of 17 slices shown (1 of 2)]
[im 1/17]
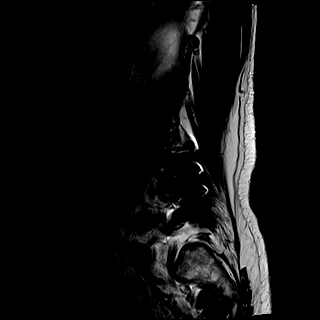
[im 4/17]
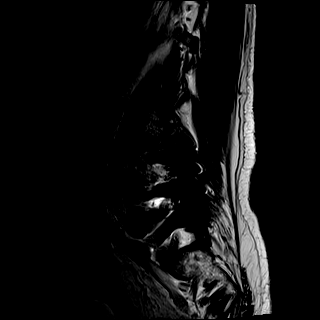
[im 7/17]
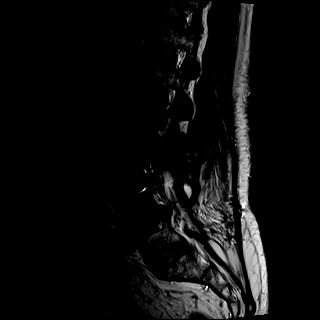
[im 10/17]
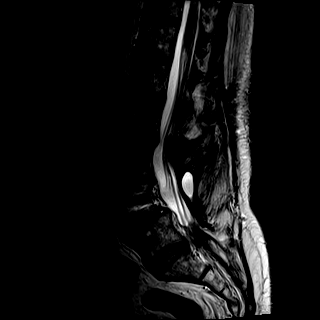
[im 13/17]
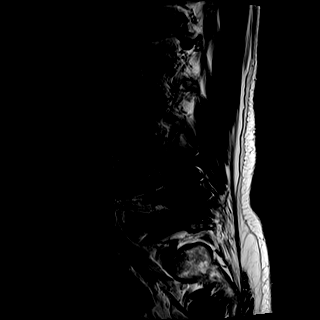
[im 17/17]
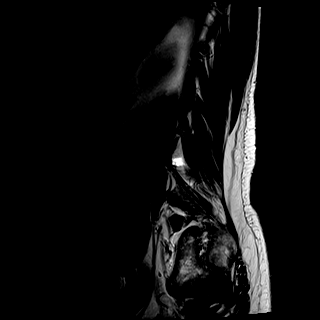

[Series 6: STIR · sagittal · 4.0mm · 0.41mm/px · 1 of 17 slices shown]
[im 1/17]
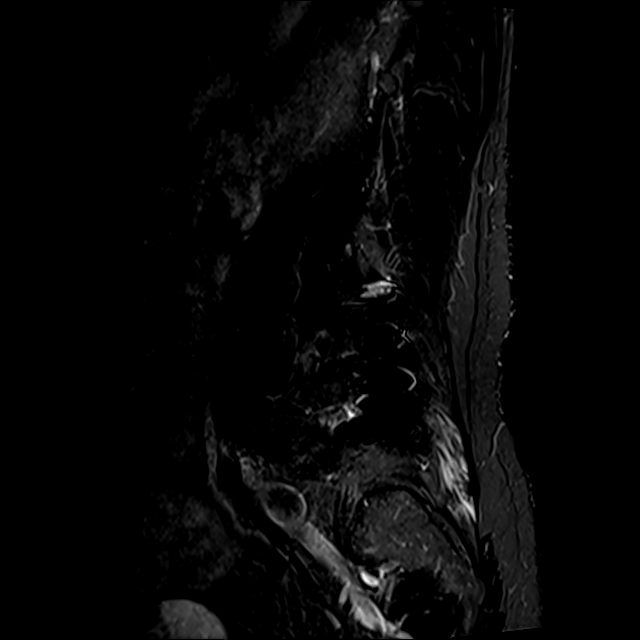

[Series 8: T1 · sagittal · 4.0mm · 0.81mm/px · 6 of 17 slices shown (1 of 2)]
[im 1/17]
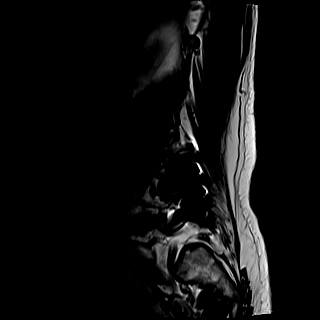
[im 4/17]
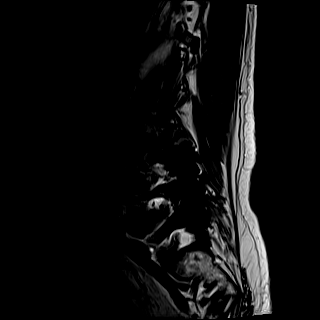
[im 7/17]
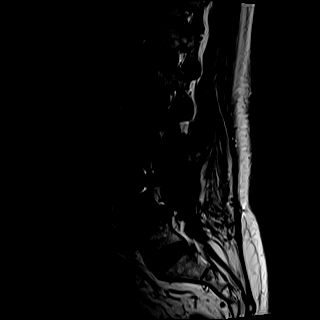
[im 10/17]
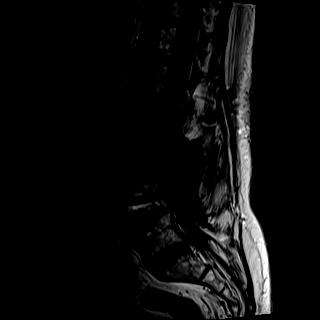
[im 13/17]
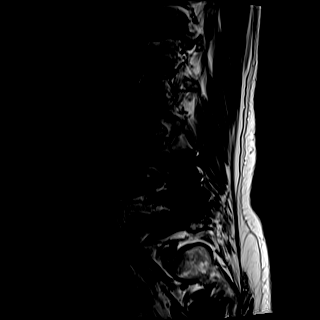
[im 17/17]
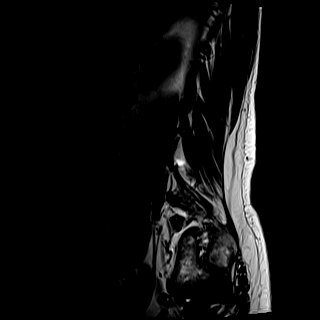

[Series 11: T2 · axial · 4.0mm · 0.78mm/px · z∈[-123,+113]mm · 9 of 40 slices shown (2 of 2)]
[im 1/40]
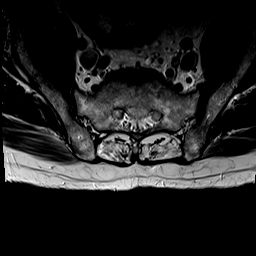
[im 6/40]
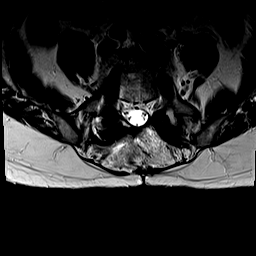
[im 12/40]
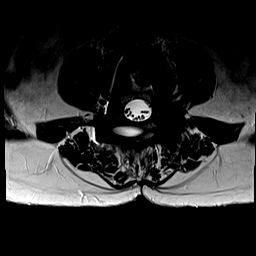
[im 17/40]
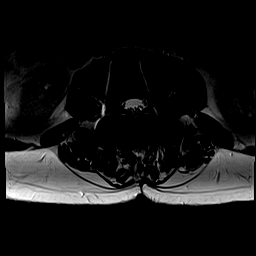
[im 20/40]
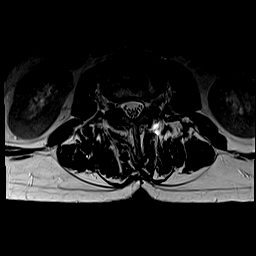
[im 23/40]
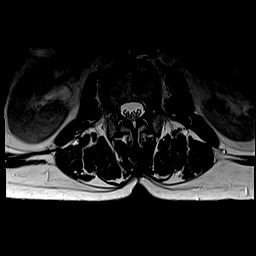
[im 28/40]
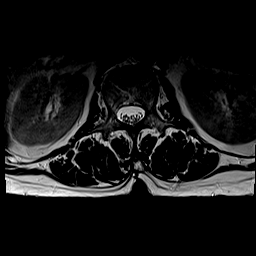
[im 34/40]
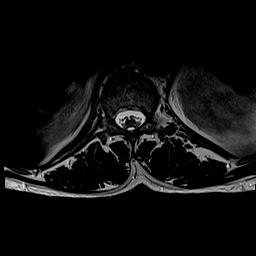
[im 40/40]
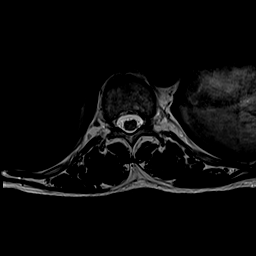

[Series 12: T1 · axial · 4.0mm · 0.39mm/px · z∈[-123,+113]mm · 9 of 40 slices shown (2 of 2)]
[im 1/40]
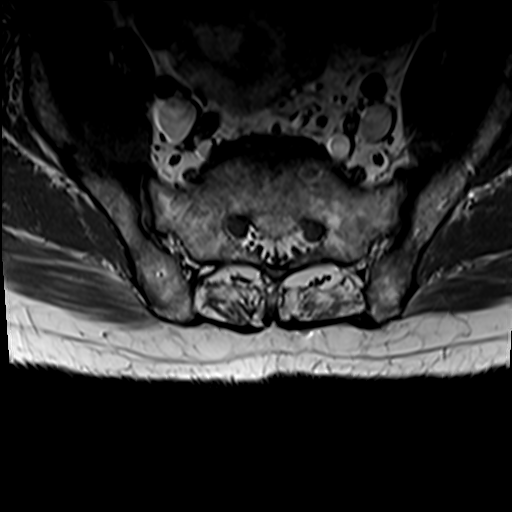
[im 6/40]
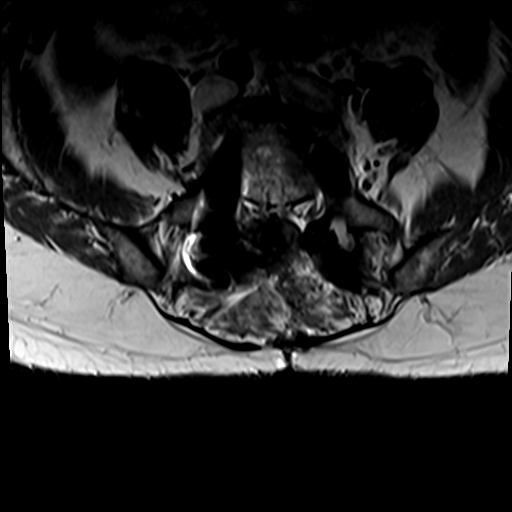
[im 12/40]
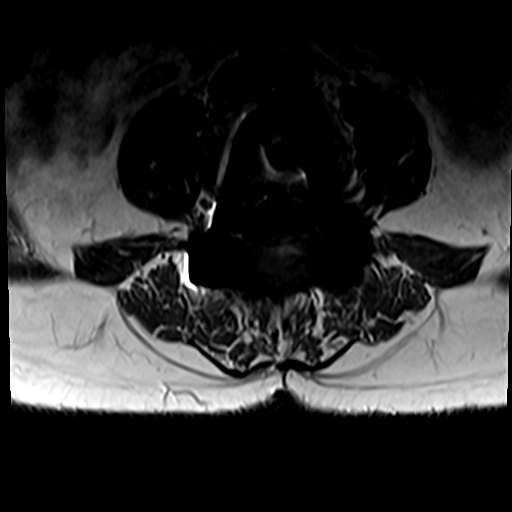
[im 17/40]
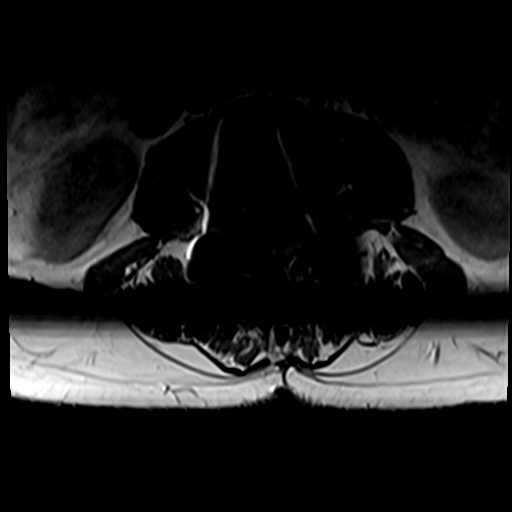
[im 20/40]
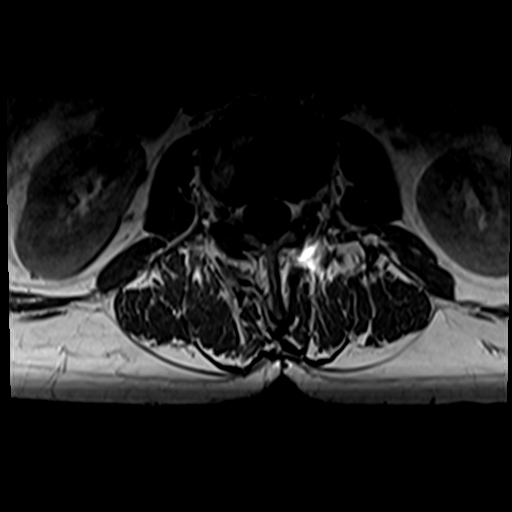
[im 23/40]
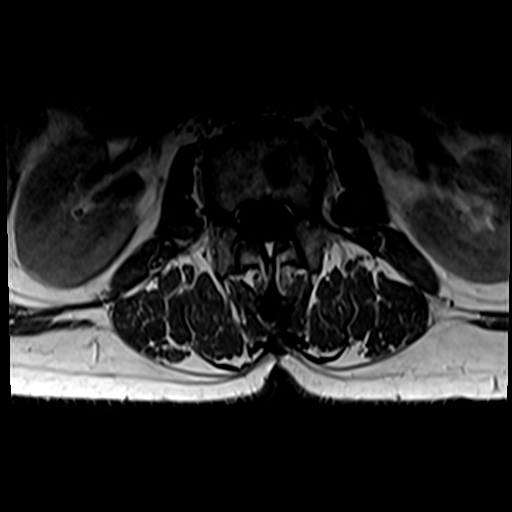
[im 28/40]
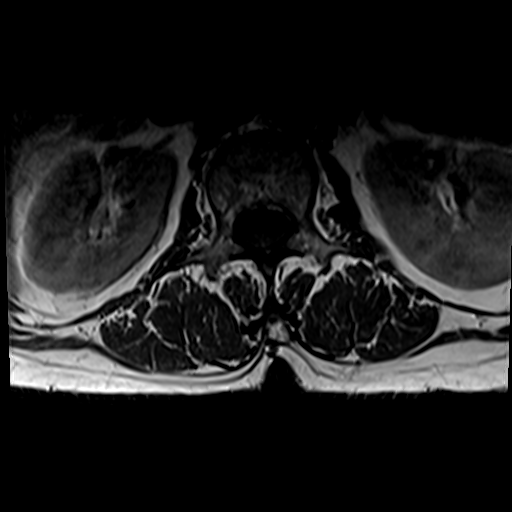
[im 34/40]
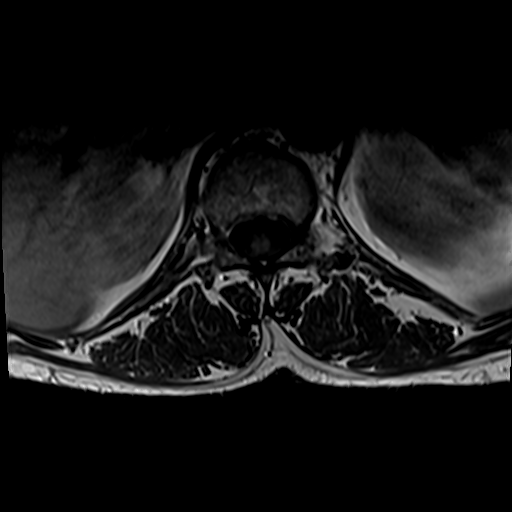
[im 40/40]
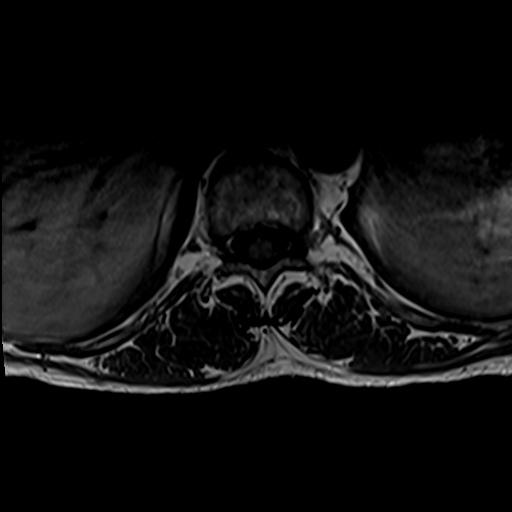

[31 of 48 positions shown; findings below may reference images not displayed]

FINDINGS: Segmentation:  Standard.

Alignment: 8 mm anterolisthesis L4 on L5. 4 mm retrolisthesis L2 on
L3.

Vertebrae: Susceptibility artifact related to posterior and
interbody fusion at L3-L5. No evidence of acute fracture or
discitis. Diffuse marrow heterogeneity without discrete marrow
replacing lesion. Findings are nonspecific but can be seen in the
setting of chronic anemia, smoking, and/or obesity.

Conus medullaris and cauda equina: Conus extends to the T12-L1
level. Conus and cauda equina appear normal.

Paraspinal and other soft tissues: Well-circumscribed ovoid fluid
collection at laminotomy site posterior to L3-4 and L4-5 measuring
approximately 2.7 x 0.9 x 2.2 cm. Findings suggestive of a
postoperative seroma there is fatty infiltration of the posterior
paraspinal musculature below the L3-4 level with associated mild
intramuscular edema. No acute prevertebral soft tissue abnormality.

Disc levels:

T12-L1: No significant disc protrusion, foraminal stenosis, or canal
stenosis.

L1-L2: No significant disc protrusion, foraminal stenosis, or canal
stenosis.

L2-L3: Slight retrolisthesis with diffuse disc bulge and mild
posterior element hypertrophy resulting in mild canal stenosis and
mild-to-moderate bilateral foraminal stenosis.

L3-L4: Prior fusion and posterior decompression. No evidence of
residual foraminal or canal stenosis.

L4-L5: Prior fusion and posterior decompression. Grade 1
anterolisthesis. No evidence residual foraminal or canal stenosis.

L5-S1: Minimal disc bulging. Mild bilateral facet arthrosis. No
foraminal or canal stenosis.
IMPRESSION: 1. Prior posterior and interbody fusion at L3-L5. No evidence of
residual foraminal or canal stenosis at these levels.
2. Adjacent segment disease at L2-3 with mild canal stenosis and
mild-to-moderate bilateral foraminal stenosis.
3. Diffuse marrow heterogeneity without discrete marrow replacing
lesion. Findings are nonspecific but can be seen in the setting of
chronic anemia, smoking, and/or obesity.
4. Intramuscular edema within the posterior paraspinal muscles of
the lower lumbar spine, is nonspecific and could be secondary to
muscular strain.
5. Small simple appearing postoperative fluid collection posterior
to the L3-4 and L4-5 levels, most likely representing a small
seroma.

## 2021-05-02 ENCOUNTER — Ambulatory Visit
Admission: RE | Admit: 2021-05-02 | Discharge: 2021-05-02 | Disposition: A | Discharge status: Home / Self Care | Payer: Medicare Other | Source: Ambulatory Visit | Attending: Internal Medicine | Admitting: Internal Medicine

## 2021-05-02 DIAGNOSIS — R29898 Other symptoms and signs involving the musculoskeletal system: Secondary | ICD-10-CM | POA: Diagnosis present

## 2021-05-02 DIAGNOSIS — M25551 Pain in right hip: Secondary | ICD-10-CM | POA: Diagnosis present

## 2021-05-02 DIAGNOSIS — M549 Dorsalgia, unspecified: Secondary | ICD-10-CM

## 2021-05-02 DIAGNOSIS — M5416 Radiculopathy, lumbar region: Secondary | ICD-10-CM | POA: Insufficient documentation

## 2021-05-05 ENCOUNTER — Encounter: Payer: Self-pay | Admitting: Internal Medicine

## 2021-05-05 ENCOUNTER — Telehealth: Payer: Self-pay | Admitting: Internal Medicine

## 2021-05-05 DIAGNOSIS — M1611 Unilateral primary osteoarthritis, right hip: Secondary | ICD-10-CM | POA: Insufficient documentation

## 2021-05-05 DIAGNOSIS — E049 Nontoxic goiter, unspecified: Secondary | ICD-10-CM | POA: Insufficient documentation

## 2021-05-05 HISTORY — DX: Unilateral primary osteoarthritis, right hip: M16.11

## 2021-05-05 NOTE — Telephone Encounter (Signed)
Nicky from [facility] scheduling called stating that the pt can eat. No restrictions

## 2021-05-05 NOTE — Addendum Note (Signed)
Addended by: Orland Mustard on: 05/05/2021 03:02 PM   Modules accepted: Orders

## 2021-05-06 ENCOUNTER — Other Ambulatory Visit: Payer: Self-pay | Admitting: Internal Medicine

## 2021-05-09 ENCOUNTER — Encounter: Payer: Self-pay | Admitting: Internal Medicine

## 2021-05-09 ENCOUNTER — Other Ambulatory Visit: Payer: Self-pay

## 2021-05-09 ENCOUNTER — Ambulatory Visit (INDEPENDENT_AMBULATORY_CARE_PROVIDER_SITE_OTHER): Payer: Medicare Other | Admitting: Internal Medicine

## 2021-05-09 VITALS — BP 106/66 | HR 65 | Temp 97.5°F | Ht 67.0 in | Wt 186.0 lb

## 2021-05-09 DIAGNOSIS — R600 Localized edema: Secondary | ICD-10-CM

## 2021-05-09 DIAGNOSIS — E049 Nontoxic goiter, unspecified: Secondary | ICD-10-CM

## 2021-05-09 DIAGNOSIS — R937 Abnormal findings on diagnostic imaging of other parts of musculoskeletal system: Secondary | ICD-10-CM | POA: Insufficient documentation

## 2021-05-09 DIAGNOSIS — M25531 Pain in right wrist: Secondary | ICD-10-CM | POA: Diagnosis not present

## 2021-05-09 DIAGNOSIS — E876 Hypokalemia: Secondary | ICD-10-CM

## 2021-05-09 DIAGNOSIS — R140 Abdominal distension (gaseous): Secondary | ICD-10-CM

## 2021-05-09 DIAGNOSIS — M1611 Unilateral primary osteoarthritis, right hip: Secondary | ICD-10-CM | POA: Diagnosis not present

## 2021-05-09 DIAGNOSIS — E041 Nontoxic single thyroid nodule: Secondary | ICD-10-CM

## 2021-05-09 DIAGNOSIS — I10 Essential (primary) hypertension: Secondary | ICD-10-CM

## 2021-05-09 HISTORY — DX: Abnormal findings on diagnostic imaging of other parts of musculoskeletal system: R93.7

## 2021-05-09 MED ORDER — POTASSIUM CHLORIDE ER 10 MEQ PO CPCR: 20.0000 meq | ORAL_CAPSULE | Freq: Every day | ORAL | 3 refills | Status: DC

## 2021-05-09 NOTE — Progress Notes (Addendum)
Chief Complaint  Patient presents with   Follow-up   F/u  1. Chronic right hip pain arthritis 7/10 and right wrist pain s/p fall wants 2nd opinion wrist and abnormal MRIs T and L spine feels like right leg is heavy leaning forward helps back and standing str8 or walking worse  Pt wants to hold PT for now 2. Still with ab bloating disc ct ab/pelvis will let me know 3 leg edema better no DVT could be due to norvasc 10 mg qd pt does not want to change for now and will try prn lasix 20  4. Goiter thyroid US 05/12/21    Review of Systems  Musculoskeletal:  Positive for back pain and joint pain.  Past Medical History:  Diagnosis Date   Anemia    Arthritis    BPH (benign prostatic hyperplasia)    CAD (coronary artery disease)    x 7 cardiac stents   Chronic back pain    L4/5 with chronic right leg pain    COVID-19    10/20/20   [place], vitreous, left    Hx of hepatitis C    treated with Harvoni in 2014   Hyperlipidemia    Hypertension    Neuromuscular disorder ([place])    nerve damage in back   Neuropathy    Prostate cancer ([place])    PVD (peripheral vascular disease) ([place])    2 stents right leg and 1 in left    Ulcerative colitis ([place])    with diarrhea   Past Surgical History:  Procedure Laterality Date   back surgery     x 2 lumbar last 09/2016 in [place]  06/02/2021   Procedure: BIOPSY;  Surgeon: Irving Copas., MD;  Location: [facility];  Service: Gastroenterology;;  EGD and COLON   CARDIAC CATHETERIZATION     CATARACT EXTRACTION, BILATERAL     COLONOSCOPY     last 09/2018 in [place] N/A 06/02/2021   Procedure: COLONOSCOPY WITH PROPOFOL;  Surgeon: Irving Copas., MD;  Location: [facility];  Service: Gastroenterology;  Laterality: N/A;   CORONARY BALLOON ANGIOPLASTY N/A 03/21/2020   Procedure: CORONARY BALLOON ANGIOPLASTY;  Surgeon: Nelva Bush, MD;  Location: [facility];  Service:  Cardiovascular;  Laterality: N/A;   ESOPHAGOGASTRODUODENOSCOPY (EGD) WITH PROPOFOL N/A 06/02/2021   Procedure: ESOPHAGOGASTRODUODENOSCOPY (EGD) WITH PROPOFOL;  Surgeon: Rush Landmark Telford Nab., MD;  Location: [facility];  Service: Gastroenterology;  Laterality: N/A;   HERNIA REPAIR     right    INTRAVASCULAR PRESSURE WIRE/FFR STUDY N/A 03/21/2020   Procedure: INTRAVASCULAR PRESSURE WIRE/FFR STUDY;  Surgeon: Nelva Bush, MD;  Location: [facility];  Service: Cardiovascular;  Laterality: N/A;   LEFT HEART CATH AND CORONARY ANGIOGRAPHY N/A 03/21/2020   Procedure: LEFT HEART CATH AND CORONARY ANGIOGRAPHY;  Surgeon: Nelva Bush, MD;  Location: [facility];  Service: Cardiovascular;  Laterality: N/A;   PENILE PROSTHESIS IMPLANT     PERCUTANEOUS CORONARY STENT INTERVENTION (PCI-S)     x 7-8 heart   PERIPHERAL ARTERIAL STENT GRAFT     PROSTATE BIOPSY     pvd     with stenting x 2 right leg below knee and 1 left thigh    SHOULDER ARTHROSCOPY Bilateral    UPPER ESOPHAGEAL ENDOSCOPIC ULTRASOUND (EUS) N/A 06/02/2021   Procedure: UPPER ESOPHAGEAL ENDOSCOPIC ULTRASOUND (EUS);  Surgeon: Irving Copas., MD;  Location: [facility];  Service: Gastroenterology;  Laterality: N/A;  VITRECTOMY     right eye   Family History  Problem Relation Age of Onset   Heart disease Mother    Stroke Mother    Breast cancer Mother    Diabetes Sister    Diabetes Brother    Colon cancer Neg Hx    Esophageal cancer Neg Hx    Inflammatory bowel disease Neg Hx    Liver disease Neg Hx    Pancreatic cancer Neg Hx    Rectal cancer Neg Hx    Stomach cancer Neg Hx    Social History   Socioeconomic History   Marital status: Married    Spouse name: Not on file   Number of children: 2   Years of education: Not on file   Highest education level: Not on file  Occupational History   Not on file  Tobacco Use   Smoking status: Former    Packs/day: 2.00    Years: 40.00    Pack years:  80.00    Types: Cigarettes    Quit date: 04/20/1998    Years since quitting: 23.1   Smokeless tobacco: Never   Tobacco comments:    quit in 2000s   Vaping Use   Vaping Use: Never used  Substance and Sexual Activity   Alcohol use: Yes    Comment: occasional   Drug use: Not Currently   Sexual activity: Yes  Other Topics Concern   Not on file  Social History Narrative   Moved from [place] in 10/2018   2 sons in 64s as of 11/24/2018    Married wife is DPR Pamala Hurry x 45 years as of 04/2019   Social Determinants of Radio broadcast assistant Strain: Low Risk    Difficulty of Paying Living Expenses: Not hard at all  Food Insecurity: No Food Insecurity   Worried About Charity fundraiser in the Last Year: Never true   Arboriculturist in the Last Year: Never true  Transportation Needs: No Transportation Needs   Lack of Transportation (Medical): No   Lack of Transportation (Non-Medical): No  Physical Activity: Not on file  Stress: No Stress Concern Present   Feeling of Stress : Not at all  Social Connections: Unknown   Frequency of Communication with Friends and Family: Not on file   Frequency of Social Gatherings with Friends and Family: Not on file   Attends Religious Services: Not on file   Active Member of Clubs or Organizations: Not on file   Attends Archivist Meetings: Not on file   Marital Status: Married  Human resources officer Violence: Not At Risk   Fear of Current or Ex-Partner: No   Emotionally Abused: No   Physically Abused: No   Sexually Abused: No   Current Meds  Medication Sig   amLODipine (NORVASC) 10 MG tablet Take 1 tablet (10 mg total) by mouth daily.   atorvastatin (LIPITOR) 40 MG tablet Take 1 tablet (40 mg total) by mouth daily.   clopidogrel (PLAVIX) 75 MG tablet Take 1 tablet (75 mg total) by mouth daily.   cyclobenzaprine (FLEXERIL) 5 MG tablet TAKE 1-2 TABLETS (5-10 MG TOTAL) BY MOUTH AT BEDTIME AS NEEDED FOR MUSCLE SPASMS. (Patient taking  differently: Take 10 mg by mouth at bedtime.)   furosemide (LASIX) 20 MG tablet Take 20 mg by mouth daily as needed for fluid.   hyoscyamine (LEVBID) 0.375 MG 12 hr tablet Take 1 tablet (0.375 mg total) by mouth every 12 (twelve) hours  as needed.   lidocaine (LIDODERM) 5 % Place 1 patch onto the skin daily as needed (pain). Remove patch after 12 hours.   mesalamine (LIALDA) 1.2 g EC tablet Take 2 tablets (2.4 g total) by mouth daily with breakfast.   metoprolol succinate (TOPROL-XL) 100 MG 24 hr tablet TAKE 1 TABLET BY MOUTH DAILY. TAKE WITH OR IMMEDIATELY FOLLOWING A MEAL.   nitroGLYCERIN (NITROSTAT) 0.4 MG SL tablet TAKE 1 TABLET BY MOUTH SUBLINGUALLY EVERY 5 MINS AS NEEDED FOR CHEST PAIN. IF >1 NEEDED, CALL 911   potassium chloride (MICRO-K) 10 MEQ CR capsule Take 2 capsules (20 mEq total) by mouth daily. D/c tablet   RESTASIS 0.05 % ophthalmic emulsion Place 1 drop into both eyes See admin instructions. Instill 1 drop into both eyes (scheduled) every morning & may instill in the evening if needed for eye irritation/dryness   telmisartan-hydrochlorothiazide (MICARDIS HCT) 80-12.5 MG tablet Take 1 tablet by mouth daily. In am d/c 40-12.5 mg dose   [DISCONTINUED] methylPREDNISolone (MEDROL DOSEPAK) 4 MG TBPK tablet As directed (Patient not taking: Reported on 05/27/2021)   [DISCONTINUED] Na Sulfate-K Sulfate-Mg Sulf (SUPREP BOWEL PREP KIT) 17.5-3.13-1.6 GM/177ML SOLN Take 1 kit by mouth as directed. For colonoscopy prep   [DISCONTINUED] oxyCODONE-acetaminophen (PERCOCET) 5-325 MG tablet Take 1 tablet by mouth 2 (two) times daily as needed for severe pain.   [DISCONTINUED] pantoprazole (PROTONIX) 40 MG tablet Take 1 tablet (40 mg total) by mouth daily. (Patient not taking: Reported on 05/27/2021)   [DISCONTINUED] potassium chloride SA (KLOR-CON M) 20 MEQ tablet Take 1 tablet (20 mEq total) by mouth daily. With lasix dose as needed   Allergies  Allergen Reactions   Cymbalta [Duloxetine Hcl] Other (See  Comments)    drowsiness   Recent Results (from the past 2160 hour(s))  Comprehensive metabolic panel     Status: Abnormal   Collection Time: 04/30/21  3:27 PM  Result Value Ref Range   Sodium 140 135 - 145 mEq/L   Potassium 3.3 (L) 3.5 - 5.1 mEq/L   Chloride 104 96 - 112 mEq/L   CO2 30 19 - 32 mEq/L   Glucose, Bld 98 70 - 99 mg/dL   BUN 12 6 - 23 mg/dL   Creatinine, Ser 0.91 0.40 - 1.50 mg/dL   Total Bilirubin 0.8 0.2 - 1.2 mg/dL   Alkaline Phosphatase 65 39 - 117 U/L   AST 27 0 - 37 U/L   ALT 24 0 - 53 U/L   Total Protein 7.8 6.0 - 8.3 g/dL   Albumin 4.3 3.5 - 5.2 g/dL   GFR 84.14 >60.00 mL/min    Comment: Calculated using the CKD-EPI Creatinine Equation (2021)   Calcium 9.8 8.4 - 10.5 mg/dL  D-Dimer, Quantitative     Status: None   Collection Time: 04/30/21  3:27 PM  Result Value Ref Range   D-Dimer, Quant 0.36 <0.50 mcg/mL FEU    Comment: . The D-Dimer test is used frequently to exclude an acute PE or DVT. In patients with a low to moderate clinical risk assessment and a D-Dimer result <0.50 mcg/mL FEU, the likelihood of a PE or DVT is very low. However, a thromboembolic event should not be excluded solely on the basis of the D-Dimer level. Increased levels of D-Dimer are associated with a PE, DVT, DIC, malignancies, inflammation, sepsis, surgery, trauma, pregnancy, and advancing patient age. [[place]ama 2006 11:295(2):199-207] . For additional information, please refer to: http://education.questdiagnostics.com/faq/FAQ149 (This link is being provided for informational/ educational purposes only) .  B Nat Peptide     Status: None   Collection Time: 04/30/21  3:27 PM  Result Value Ref Range   Pro B Natriuretic peptide (BNP) 29.0 0.0 - 100.0 pg/mL  TSH     Status: None   Collection Time: 04/30/21  3:27 PM  Result Value Ref Range   TSH 1.01 0.35 - 5.50 uIU/mL  CBC with Differential/Platelet     Status: Abnormal   Collection Time: 04/30/21  3:27 PM  Result Value Ref  Range   WBC 5.7 4.0 - 10.5 K/uL   RBC 3.62 (L) 4.22 - 5.81 Mil/uL   Hemoglobin 11.8 (L) 13.0 - 17.0 g/dL   HCT 35.6 (L) 39.0 - 52.0 %   MCV 98.4 78.0 - 100.0 fl   MCHC 33.1 30.0 - 36.0 g/dL   RDW 13.4 11.5 - 15.5 %   Platelets 224.0 150.0 - 400.0 K/uL   Neutrophils Relative % 55.6 43.0 - 77.0 %   Lymphocytes Relative 27.6 12.0 - 46.0 %   Monocytes Relative 12.7 (H) 3.0 - 12.0 %   Eosinophils Relative 3.6 0.0 - 5.0 %   Basophils Relative 0.5 0.0 - 3.0 %   Neutro Abs 3.2 1.4 - 7.7 K/uL   Lymphs Abs 1.6 0.7 - 4.0 K/uL   Monocytes Absolute 0.7 0.1 - 1.0 K/uL   Eosinophils Absolute 0.2 0.0 - 0.7 K/uL   Basophils Absolute 0.0 0.0 - 0.1 K/uL  Calprotectin, Fecal     Status: None   Collection Time: 05/27/21  7:37 AM   Specimen: Stool   Stool  Result Value Ref Range   Calprotectin, Fecal <16 0 - 120 ug/g    Comment: Concentration     Interpretation   Follow-Up <16 - 50 ug/g     Normal           None >50 -120 ug/g     Borderline       Re-evaluate in 4-6 weeks     >120 ug/g     Abnormal         Repeat as clinically                                    indicated   Surgical pathology     Status: None   Collection Time: 06/02/21 12:17 PM  Result Value Ref Range   SURGICAL PATHOLOGY      SURGICAL PATHOLOGY CASE: [id number] PATIENT: Arlan Organ Surgical Pathology Report     Clinical History: Gastric SE, ulcerative colitis, GERD, weight gain ([facility])     FINAL MICROSCOPIC DIAGNOSIS:  A. DUODENUM, ULCERS, BIOPSY: - Duodenitis with focal erosion consistent with peptic injury.  B. STOMACH, BIOPSY: - Antral and oxyntic mucosa with hyperemia. - No Helicobacter pylori identified.  C. COLON, RIGHT, BIOPSY: - Unremarkable colonic mucosa. - No active inflammation or chronic changes. - Negative for dysplasia.  D. COLON, LEFT, BIOPSY: - Unremarkable colonic mucosa. - No active inflammation or chronic changes. - Negative for dysplasia.  E. COLON, RECTUM, BIOPSY: -  Unremarkable colonic mucosa. - No active inflammation or chronic changes. - Negative for dysplasia.    GROSS DESCRIPTION:  A.  Received in formalin are tan, soft tissue fragments that are submitted in toto. Number: 5 size: 0.1 to 0.2 cm blocks: 1  B.  Received in formalin are  tan, soft tissue fragments that are submitted in toto. Number: 6 size: 0.1 to 0.4  cm blocks: 1  C.  Received in formalin are tan, soft tissue fragments that are submitted in toto. Number: 9 size: 0.1 to 0.2 cm blocks: 1  D.  Received in formalin are tan, soft tissue fragments that are submitted in toto. Number: 12 size: 0.1 to 0.2 cm blocks: 1  E.  Received in formalin are tan, soft tissue fragments that are submitted in toto. Number: 4 size: 0.1 to 0.2 cm blocks: 1 (KW, 06/02/2021)   Final Diagnosis performed by Claudette Laws, MD.   Electronically signed 06/03/2021 Technical and / or Professional components performed at [facility], [place] [address], [place], [place] [address].  Immunohistochemistry Technical component (if applicable) was performed at [facility]. [address], [place], [place], [place] [address].   IMMUNOHISTOCHEMISTRY DISCLAIMER (if applicable): Some of these immunohistochemical stains may have been developed  and the performance characteristics determine by [facility]. Some may not have been cleared or approved by the U.S. Food and Drug Administration. The FDA has determined that such clearance or approval is not necessary. This test is used for clinical purposes. It should not be regarded as investigational or for research. This laboratory is certified under the [place] (CLIA-88) as qualified to perform high complexity clinical laboratory testing.  The controls stained appropriately.    Objective  Body mass index is 29.13 kg/m. Wt Readings from Last 3 Encounters:  06/18/21 180 lb  (81.6 kg)  06/02/21 180 lb (81.6 kg)  05/09/21 186 lb (84.4 kg)   Temp Readings from Last 3 Encounters:  06/18/21 98.2 F (36.8 C) (Oral)  06/02/21 (!) 97.4 F (36.3 C) (Temporal)  05/09/21 (!) 97.5 F (36.4 C) (Oral)   BP Readings from Last 3 Encounters:  06/18/21 115/78  06/02/21 (!) 148/78  05/09/21 106/66   Pulse Readings from Last 3 Encounters:  06/18/21 79  06/02/21 74  05/09/21 65    Physical Exam Vitals and nursing note reviewed.  Constitutional:      Appearance: Normal appearance. He is well-developed and well-groomed.  HENT:     Head: Normocephalic and atraumatic.  Eyes:     Conjunctiva/sclera: Conjunctivae normal.     Pupils: Pupils are equal, round, and reactive to light.  Cardiovascular:     Rate and Rhythm: Normal rate and regular rhythm.     Heart sounds: Normal heart sounds.  Pulmonary:     Effort: Pulmonary effort is normal. No respiratory distress.     Breath sounds: Normal breath sounds.  Abdominal:     Tenderness: There is no abdominal tenderness.  Musculoskeletal:     Lumbar back: Tenderness present. Negative right straight leg raise test and negative left straight leg raise test.  Skin:    General: Skin is warm and moist.  Neurological:     General: No focal deficit present.     Mental Status: He is alert and oriented to person, place, and time. Mental status is at baseline.     Sensory: Sensation is intact.     Motor: Motor function is intact.     Coordination: Coordination is intact.     Gait: Gait is intact. Gait normal.  Psychiatric:        Attention and Perception: Attention and perception normal.        Mood and Affect: Mood and affect normal.        Speech: Speech normal.        Behavior: Behavior normal. Behavior is  cooperative.        Thought Content: Thought content normal.        Cognition and Memory: Cognition and memory normal.        Judgment: Judgment normal.    Assessment  Plan  Right wrist pain - Plan: Ambulatory  referral to Orthopedic Surgery Arthritis of right hip - Plan: Ambulatory referral to Orthopedic Surgery Drs Amedeo Plenty and Alvan Dame  Hypokalemia - Plan: potassium chloride (MICRO-K) 10 MEQ CR capsule to take with prn lasix leg edema  Goiter US thyroid sch 05/12/21   Abdominal bloating Consider CT ab/pelvis in the future   Abnormal MRI, lumbar spine both 05/02/21  Abnormal MRI, thoracic spine  NS pending  Referral to pain clinic  CLINICAL DATA:  Lumbar radiculopathy, symptoms persist with > 6 wks treatment; right lumbar radiculopathy with right hip pain trouble walking standing; Mid-back pain and leg weakness   EXAM: MRI THORACIC AND LUMBAR SPINE WITHOUT CONTRAST   TECHNIQUE: Multiplanar and multiecho pulse sequences of the thoracic and lumbar spine were obtained without intravenous contrast.   COMPARISON:  Lumbar spine MRI May 07, 2019   FINDINGS: MRI THORACIC SPINE   Alignment:  Preserved.   Vertebrae: Vertebral body heights are maintained. Marrow signal is mildly heterogeneous. There is a vertebral body hemangioma at T10.   Cord:  No abnormal signal.   Paraspinal and other soft tissues: Enlarged, heterogeneous right thyroid with superior mediastinal extension.   Disc levels: Minor degenerative changes are present. There is no notable canal or foraminal stenosis at any level.   MRI LUMBAR SPINE   Segmentation:  Standard.   Alignment: Stable with retrolisthesis at L2-L3 and anterolisthesis at L4-L5.   Vertebrae: Stable vertebral body heights. Postoperative changes are again identified at L3-L5 with rods and pedicle screws and interbody spacers. Hardware is not well evaluated on this study and there is associated susceptibility artifact. Marrow signal is mildly heterogeneous. Minor degenerative endplate marrow edema at L2-L3.   Conus medullaris and cauda equina: Conus extends to the T12-L1 level. Conus and cauda equina appear normal.   Paraspinal and other soft  tissues: Postoperative changes. There is a similar small fluid collection within the laminectomy bed at the L4 level without mass effect on the thecal sac.   Disc levels:   L1-L2:  No canal or foraminal stenosis.   L2-L3: Disc bulge with endplate osteophytic ridging and facet arthropathy with ligamentum flavum infolding. Stable to minimally increased mild canal stenosis. Slightly increased mild to moderate right and similar mild to moderate left foraminal stenosis.   L3-L4: Postoperative level. Canal remains decompressed. Endplate osteophytes are present. No significant foraminal narrowing.   L4-L5: Postoperative level. Canal remains decompressed. Endplate osteophytes are present. No significant foraminal narrowing.   L5-S1:  Mild facet arthropathy.  No canal or foraminal stenosis.   IMPRESSION: Minor thoracic spine degenerative changes without stenosis.   Postoperative and multilevel degenerative changes of the lumbar spine. Slight progression at L2-L3 without high-grade stenosis.   Enlarged, heterogeneous right thyroid with superior mediastinal extension. Consider ultrasound evaluation if not performed previously and there are no significant comorbidities or limited life expectancy.     Htn  Cont same meds for now if leg edema an issue cont reduce norvasc to 5 mg  Use prn lasix leg edema with K 20 meq   Goiter and thyroid nodule  [place] 04/2021  IMPRESSION: 1. Enlarged multinodular thyroid gland. 2. Nodules labeled 2, 3 and 4 meet criteria for biopsy. Based on TI-RADS recommendations,  the 2 most suspicious nodules should be biopsied. Therefore, nodules 3 and 4 are recommended for biopsy. 3. Nodule labeled 1 meets criteria for follow-up ultrasound in 1 year.   The above is in keeping with the ACR TI-RADS recommendations - J Am Coll Radiol 2017;14:587-595.     Electronically Signed   By: Albin Felling M.D.   On: 05/15/2021 08:17 HM Get copy of records in future  from Dr. Lyman Speller in [place] (I.e vaccines utd flu shot 02/05/21 covid vx 4/4 pfizer    -vaccines ROI sent prev per pt had shingles vaccine, tdap, and pna shots [facility] [place] ROI sent prev    colonoscopy/EGD repeat leb GI utd and UC in remission    Colonoscopy per pt last 09/2018 in [place] per pt  Had 05/2019 and EGD with IH/H, polyps negative path nodule and gastritis EGD rec repeat EUS and EGD in 3 months per GI  Colonoscopy 05/30/19 as of 08/06/20 declines further    Former smoker quit in 2000s    PCP Dr. Lyman Speller in [place] still need copy of prior vaccines      Recurrent prostate cancer tx tbd Dr. [facility] and Dr. Tammi Klippel rad on s/p rad x 2 sessions stopped 2 weeks from 08/04/19   Prior specialist: Dr. Stephanie [facility] appt 06/07/19 Rx percocet 5-325 1/2 po bid prn lyrica 50 mg bid rtc in 3 weeks  Provider: Dr. Olivia Mackie [name]

## 2021-05-09 NOTE — Patient Instructions (Addendum)
[facility] in [place] Dr. Amedeo Plenty (wrist) and Dr.Olin (hip  Phone Fax E-mail Address  [phone] Not available [address]   [address]   [place] [address]     Specialties      Consider CT abdomen/pelvis with abdominal bloating  Consider PT for back and hip ([facility] or [facility] [address]) If leg swelling is not tolerable cut norvasc to 5 mg

## 2021-05-12 ENCOUNTER — Ambulatory Visit: Admission: RE | Admit: 2021-05-12 | Payer: Medicare Other | Source: Ambulatory Visit

## 2021-05-14 ENCOUNTER — Ambulatory Visit
Admission: RE | Admit: 2021-05-14 | Discharge: 2021-05-14 | Disposition: A | Discharge status: Home / Self Care | Payer: Medicare Other | Source: Ambulatory Visit | Attending: Internal Medicine | Admitting: Internal Medicine

## 2021-05-14 ENCOUNTER — Other Ambulatory Visit: Payer: Self-pay

## 2021-05-14 DIAGNOSIS — E049 Nontoxic goiter, unspecified: Secondary | ICD-10-CM | POA: Insufficient documentation

## 2021-05-15 NOTE — Addendum Note (Signed)
Addended by: Orland Mustard on: 05/15/2021 01:20 PM   Modules accepted: Level of Service

## 2021-05-19 ENCOUNTER — Telehealth: Payer: Self-pay | Admitting: Internal Medicine

## 2021-05-19 NOTE — Telephone Encounter (Signed)
Deb From [employer] called in wondering if Dr. Olivia Mackie received paperwork from there office about the Pt wheel chair. Suzi Roots is requesting callback at [phone]. Reference #  [id number]

## 2021-05-21 NOTE — Addendum Note (Signed)
Addended by: Orland Mustard on: 05/21/2021 01:51 PM   Modules accepted: Orders

## 2021-05-23 ENCOUNTER — Encounter (HOSPITAL_COMMUNITY): Payer: Self-pay | Admitting: Gastroenterology

## 2021-05-25 ENCOUNTER — Other Ambulatory Visit: Payer: Self-pay | Admitting: Internal Medicine

## 2021-05-25 DIAGNOSIS — E876 Hypokalemia: Secondary | ICD-10-CM

## 2021-05-26 ENCOUNTER — Telehealth: Payer: Self-pay | Admitting: Internal Medicine

## 2021-05-26 NOTE — Telephone Encounter (Signed)
Rejection Reason - Patient Declined - Thank you for the referral. We reached out to the patient and he said he would call us back later when he's able to schedule. Thanks!" Amy Deck said on May 26, 2021 2:14 PM   Msg from [facility]

## 2021-05-26 NOTE — Telephone Encounter (Signed)
Please advise, have you received any additional paperwork

## 2021-05-26 NOTE — Telephone Encounter (Signed)
Please look in your stack I did receive another form to fill out for them it you do not see call the company and get so you can re-fax whats needed   Thank you

## 2021-05-27 ENCOUNTER — Other Ambulatory Visit: Payer: Medicare Other

## 2021-05-27 DIAGNOSIS — K219 Gastro-esophageal reflux disease without esophagitis: Secondary | ICD-10-CM

## 2021-05-27 DIAGNOSIS — K3189 Other diseases of stomach and duodenum: Secondary | ICD-10-CM

## 2021-05-27 DIAGNOSIS — R635 Abnormal weight gain: Secondary | ICD-10-CM

## 2021-05-27 NOTE — Telephone Encounter (Signed)
Deb called about the status of the wheelchair paper work.

## 2021-05-30 LAB — CALPROTECTIN, FECAL: Calprotectin, Fecal: <16 ug/g (ref 0–120)

## 2021-05-30 NOTE — Telephone Encounter (Signed)
Had to re-fax multiple times to get paperwork to go through.   Have received a confirmation this morning that the fax did go through. 05/30/21

## 2021-06-02 ENCOUNTER — Ambulatory Visit (HOSPITAL_COMMUNITY): Payer: Medicare Other | Admitting: Anesthesiology

## 2021-06-02 ENCOUNTER — Encounter (HOSPITAL_COMMUNITY): Payer: Self-pay | Admitting: Gastroenterology

## 2021-06-02 ENCOUNTER — Ambulatory Visit (HOSPITAL_COMMUNITY)
Admission: RE | Admit: 2021-06-02 | Discharge: 2021-06-02 | Disposition: A | Discharge status: Home / Self Care | Payer: Medicare Other | Attending: Gastroenterology | Admitting: Gastroenterology

## 2021-06-02 ENCOUNTER — Encounter (HOSPITAL_COMMUNITY)
Admission: RE | Disposition: A | Discharge status: Home / Self Care | Payer: Self-pay | Source: Home / Self Care | Attending: Gastroenterology

## 2021-06-02 ENCOUNTER — Other Ambulatory Visit: Payer: Self-pay

## 2021-06-02 DIAGNOSIS — K269 Duodenal ulcer, unspecified as acute or chronic, without hemorrhage or perforation: Secondary | ICD-10-CM

## 2021-06-02 DIAGNOSIS — R635 Abnormal weight gain: Secondary | ICD-10-CM

## 2021-06-02 DIAGNOSIS — K2980 Duodenitis without bleeding: Secondary | ICD-10-CM | POA: Diagnosis not present

## 2021-06-02 DIAGNOSIS — K2970 Gastritis, unspecified, without bleeding: Secondary | ICD-10-CM

## 2021-06-02 DIAGNOSIS — K5100 Ulcerative (chronic) pancolitis without complications: Secondary | ICD-10-CM | POA: Diagnosis not present

## 2021-06-02 DIAGNOSIS — I899 Noninfective disorder of lymphatic vessels and lymph nodes, unspecified: Secondary | ICD-10-CM

## 2021-06-02 DIAGNOSIS — K641 Second degree hemorrhoids: Secondary | ICD-10-CM | POA: Diagnosis not present

## 2021-06-02 DIAGNOSIS — K31A0 Gastric intestinal metaplasia, unspecified: Secondary | ICD-10-CM | POA: Insufficient documentation

## 2021-06-02 DIAGNOSIS — Z1211 Encounter for screening for malignant neoplasm of colon: Secondary | ICD-10-CM | POA: Diagnosis not present

## 2021-06-02 DIAGNOSIS — K644 Residual hemorrhoidal skin tags: Secondary | ICD-10-CM | POA: Diagnosis not present

## 2021-06-02 DIAGNOSIS — K219 Gastro-esophageal reflux disease without esophagitis: Secondary | ICD-10-CM

## 2021-06-02 DIAGNOSIS — Z87891 Personal history of nicotine dependence: Secondary | ICD-10-CM | POA: Insufficient documentation

## 2021-06-02 DIAGNOSIS — K3189 Other diseases of stomach and duodenum: Secondary | ICD-10-CM

## 2021-06-02 DIAGNOSIS — K589 Irritable bowel syndrome without diarrhea: Secondary | ICD-10-CM | POA: Diagnosis present

## 2021-06-02 HISTORY — PX: UPPER ESOPHAGEAL ENDOSCOPIC ULTRASOUND (EUS): SHX6562

## 2021-06-02 HISTORY — PX: ESOPHAGOGASTRODUODENOSCOPY (EGD) WITH PROPOFOL: SHX5813

## 2021-06-02 HISTORY — PX: BIOPSY: SHX5522

## 2021-06-02 HISTORY — PX: COLONOSCOPY WITH PROPOFOL: SHX5780

## 2021-06-02 SURGERY — ESOPHAGOGASTRODUODENOSCOPY (EGD) WITH PROPOFOL
Anesthesia: Monitor Anesthesia Care

## 2021-06-02 MED ORDER — SODIUM CHLORIDE 0.9 % IV SOLN: INTRAVENOUS | Status: DC

## 2021-06-02 MED ORDER — LACTATED RINGERS IV SOLN
INTRAVENOUS | Status: AC | PRN
  Administered 2021-06-02: 1000 mL via INTRAVENOUS

## 2021-06-02 MED ORDER — PROPOFOL 1000 MG/100ML IV EMUL
INTRAVENOUS | Status: AC
  Filled 2021-06-02: qty 100

## 2021-06-02 MED ORDER — PROPOFOL 10 MG/ML IV BOLUS
INTRAVENOUS | Status: DC | PRN
  Administered 2021-06-02 (×2): 30 mg via INTRAVENOUS
  Administered 2021-06-02 (×2): 20 mg via INTRAVENOUS

## 2021-06-02 MED ORDER — PANTOPRAZOLE SODIUM 40 MG PO TBEC
40.0000 mg | DELAYED_RELEASE_TABLET | Freq: Two times a day (BID) | ORAL | 6 refills | Status: DC
Start: 2021-06-02 — End: 2021-08-20

## 2021-06-02 MED ORDER — PROPOFOL 500 MG/50ML IV EMUL
INTRAVENOUS | Status: DC | PRN
  Administered 2021-06-02: 130 ug/kg/min via INTRAVENOUS

## 2021-06-02 MED ORDER — LIDOCAINE 2% (20 MG/ML) 5 ML SYRINGE
INTRAMUSCULAR | Status: DC | PRN
  Administered 2021-06-02: 100 mg via INTRAVENOUS

## 2021-06-02 SURGICAL SUPPLY — 25 items

## 2021-06-02 NOTE — Op Note (Signed)
[facility] Patient Name: Erik Nicholson Procedure Date: 06/02/2021 MRN: [id number] Attending MD: Justice Britain , MD Date of Birth: 05-Jul-1950 CSN: [id number] Age: 71 Admit Type: Outpatient Procedure:                Colonoscopy Indications:              High risk colon cancer surveillance: Ulcerative                            pancolitis of 8 (or more) years duration Providers:                Justice Britain, MD, Burtis Junes, RN, Tyna Jaksch Technician Referring MD:             [name] [name] MD, MD Medicines:                Monitored Anesthesia Care Complications:            No immediate complications. Estimated Blood Loss:     Estimated blood loss was minimal. Procedure:                Pre-Anesthesia Assessment:                           - Prior to the procedure, a History and Physical                            was performed, and patient medications and                            allergies were reviewed. The patient's tolerance of                            previous anesthesia was also reviewed. The risks                            and benefits of the procedure and the sedation                            options and risks were discussed with the patient.                            All questions were answered, and informed consent                            was obtained. Prior Anticoagulants: The patient has                            taken no previous anticoagulant or antiplatelet                            agents. ASA Grade Assessment: III - A patient with  severe systemic disease. After reviewing the risks                            and benefits, the patient was deemed in                            satisfactory condition to undergo the procedure.                           After obtaining informed consent, the colonoscope                            was passed under direct vision. Throughout the                             procedure, the patient's blood pressure, pulse, and                            oxygen saturations were monitored continuously. The                            [id number]-HQ190L ([id number]) Olympus colonoscope was                            introduced through the anus and advanced to the 5                            cm into the ileum. The colonoscopy was performed                            without difficulty. The patient tolerated the                            procedure. The quality of the bowel preparation was                            adequate. The terminal ileum, ileocecal valve,                            appendiceal orifice, and rectum were photographed. Scope In: 12:35:30 PM Scope Out: 12:50:11 PM Total Procedure Duration: 0 hours 14 minutes 41 seconds  Findings:      The digital rectal exam findings include hemorrhoids. Pertinent       negatives include no palpable rectal lesions.      The terminal ileum and ileocecal valve appeared normal.      Normal mucosa was found in the entire colon. IBD surveillance biopsies       were performed today so biopsies were taken with a cold forceps for       histology from the right colon. Biopsies were taken with a cold forceps       for histology from the left colon. Biopsies were taken with a cold       forceps for histology from the rectum.      Non-bleeding non-thrombosed external and internal hemorrhoids were found  during retroflexion, during perianal exam and during digital exam. The       hemorrhoids were Grade II (internal hemorrhoids that prolapse but reduce       spontaneously). Impression:               - Hemorrhoids found on digital rectal exam.                           - The examined portion of the ileum was normal.                           - Normal mucosa in the entire examined colon.                            Biopsied for surveillance.                           - Non-bleeding non-thrombosed external  and internal                            hemorrhoids. Moderate Sedation:      Not Applicable - Patient had care per Anesthesia. Recommendation:           - The patient will be observed post-procedure,                            until all discharge criteria are met.                           - Discharge patient to home.                           - Patient has a contact number available for                            emergencies. The signs and symptoms of potential                            delayed complications were discussed with the                            patient. Return to normal activities tomorrow.                            Written discharge instructions were provided to the                            patient.                           - High fiber diet.                           - Use FiberCon 1-2 tablets PO daily.                           - Await pathology  results.                           - Repeat colonoscopy in 2 years for surveillance.                           - The findings and recommendations were discussed                            with the patient.                           - The findings and recommendations were discussed                            with the patient's family. Procedure Code(s):        --- Professional ---                           [phone], Colonoscopy, flexible; with biopsy, single                            or multiple Diagnosis Code(s):        --- Professional ---                           K51.00, Ulcerative (chronic) pancolitis without                            complications                           K64.1, Second degree hemorrhoids CPT copyright 2019 American Medical Association. All rights reserved. The codes documented in this report are preliminary and upon coder review may  be revised to meet current compliance requirements. Justice Britain, MD 06/02/2021 1:13:24 PM Number of Addenda: 0

## 2021-06-02 NOTE — Transfer of Care (Signed)
Immediate Anesthesia Transfer of Care Note  Patient: Erik Nicholson  Procedure(s) Performed: ESOPHAGOGASTRODUODENOSCOPY (EGD) WITH PROPOFOL COLONOSCOPY WITH PROPOFOL UPPER ESOPHAGEAL ENDOSCOPIC ULTRASOUND (EUS) BIOPSY  Patient Location: PACU  Anesthesia Type:MAC  Level of Consciousness: sedated  Airway & Oxygen Therapy: Patient Spontanous Breathing and Patient connected to face mask oxygen  Post-op Assessment: Report given to RN and Post -op Vital signs reviewed and stable  Post vital signs: Reviewed and stable  Last Vitals:  Vitals Value Taken Time  BP    Temp    Pulse    Resp    SpO2      Last Pain:  Vitals:   06/02/21 0955  TempSrc: Temporal  PainSc: 0-No pain         Complications: No notable events documented.

## 2021-06-02 NOTE — Op Note (Signed)
[facility] Patient Name: Erik Nicholson Procedure Date: 06/02/2021 MRN: [id number] Attending MD: Justice Britain , MD Date of Birth: 07-21-1950 CSN: [id number] Age: 71 Admit Type: Outpatient Procedure:                Upper EUS Indications:              Gastric deformity on endoscopy/Subepithelial tumor                            versus extrinsic compression Providers:                Justice Britain, MD, Burtis Junes, RN, Tyna Jaksch Technician Referring MD:             [name] [name] MD, MD Medicines:                Monitored Anesthesia Care Complications:            No immediate complications. Estimated Blood Loss:     Estimated blood loss was minimal. Procedure:                Pre-Anesthesia Assessment:                           - Prior to the procedure, a History and Physical                            was performed, and patient medications and                            allergies were reviewed. The patient's tolerance of                            previous anesthesia was also reviewed. The risks                            and benefits of the procedure and the sedation                            options and risks were discussed with the patient.                            All questions were answered, and informed consent                            was obtained. Prior Anticoagulants: The patient has                            taken no previous anticoagulant or antiplatelet                            agents. ASA Grade Assessment: III - A patient with  severe systemic disease. After reviewing the risks                            and benefits, the patient was deemed in                            satisfactory condition to undergo the procedure.                           After obtaining informed consent, the endoscope was                            passed under direct vision. Throughout the                             procedure, the patient's blood pressure, pulse, and                            oxygen saturations were monitored continuously. The                            [id number]-[id number] ([id number]) Olympus endoscope was introduced                            through the mouth, and advanced to the second part                            of duodenum. The [id number]-[id number] ([id number]) Olympus                            radial ultrasound scope was introduced through the                            mouth, and advanced to the stomach for ultrasound                            examination. After obtaining informed consent, the                            endoscope was passed under direct vision.                            Throughout the procedure, the patient's blood                            pressure, pulse, and oxygen saturations were                            monitored continuously.The upper EUS was                            accomplished without difficulty. The patient  tolerated the procedure. Scope In: Scope Out: Findings:      ENDOSONOGRAPHIC FINDING: :      There was extrinsic compression in the antrum of the stomach.       Endosonographic examination showed this compression to be due to a       normal-appearing gallbladder.      Endosonographic imaging in the visualized portion of the liver showed no       mass.      No malignant-appearing lymph nodes were visualized in the celiac region       (level 20), perigastric region and peripancreatic region.      The celiac region was visualized.      ENDOSCOPIC FINDING: :      No gross lesions were noted in the esophagus.      The Z-line was regular and was found 40 cm from the incisors.      Patchy moderate inflammation characterized by erosions, erythema and       friability was found in the entire examined stomach. Biopsies were taken       with a cold forceps for histology and Helicobacter pylori testing.      Multiple cratered  duodenal ulcers with a clean ulcer base (Forrest Class       III) were found in the duodenal bulb and in the first portion of the       duodenum. The largest lesion was 8 mm in largest dimension.      Patchy moderate inflammation characterized by erosions, erythema and       friability was found in the duodenal bulb, in the first portion of the       duodenum and in the second portion of the duodenum. Biopsies were taken       with a cold forceps for histology and Helicobacter pylori testing. Impression:               EGD Impression:                           - No gross lesions in esophagus.                           - Z-line regular, 40 cm from the incisors.                           - Gastritis. Biopsied.                           - Multiple duodenal ulcers with a clean ulcer base                            (Forrest Class III).                           - Duodenitis. Biopsied.                           EUS Impression:                           - The gastric SEL is consistent with extrinsic  compression was noted in the antrum of the stomach                            due to a normal-appearing gallbladder.                           - No malignant-appearing lymph nodes were                            visualized in the celiac region (level 20),                            perigastric region and peripancreatic region. Moderate Sedation:      Not Applicable - Patient had care per Anesthesia. Recommendation:           - Proceed to scheduled colonoscopy.                           - Observe patient's clinical course.                           - Await path results.                           - No further EUS is required from a SEL perspective                            since it is the gallbladder in place.                           - Consider repeat EGD in 5-month to ensure healing                            of duodenal ulcers and gastritis.                           -  The findings and recommendations were discussed                            with the patient.                           - The findings and recommendations were discussed                            with the patient's family. Procedure Code(s):        --- Professional ---                           [phone] Esophagogastroduodenoscopy, flexible,                            transoral; with endoscopic ultrasound examination                            limited to the esophagus,  stomach or duodenum, and                            adjacent structures                           43239, Esophagogastroduodenoscopy, flexible,                            transoral; with biopsy, single or multiple Diagnosis Code(s):        --- Professional ---                           K31.89, Other diseases of stomach and duodenum                           I89.9, Noninfective disorder of lymphatic vessels                            and lymph nodes, unspecified                           K29.70, Gastritis, unspecified, without bleeding                           K26.9, Duodenal ulcer, unspecified as acute or                            chronic, without hemorrhage or perforation                           K29.80, Duodenitis without bleeding CPT copyright 2019 American Medical Association. All rights reserved. The codes documented in this report are preliminary and upon coder review may  be revised to meet current compliance requirements. Justice Britain, MD 06/02/2021 1:07:43 PM Number of Addenda: 0

## 2021-06-02 NOTE — Discharge Instructions (Signed)
YOU HAD AN ENDOSCOPIC PROCEDURE TODAY: Refer to the procedure report and other information in the discharge instructions given to you for any specific questions about what was found during the examination. If this information does not answer your questions, please call [place] office at [phone] to clarify.   YOU SHOULD EXPECT: Some feelings of bloating in the abdomen. Passage of more gas than usual. Walking can help get rid of the air that was put into your GI tract during the procedure and reduce the bloating. If you had a lower endoscopy (such as a colonoscopy or flexible sigmoidoscopy) you may notice spotting of blood in your stool or on the toilet paper. Some abdominal soreness may be present for a day or two, also.  DIET: Your first meal following the procedure should be a light meal and then it is ok to progress to your normal diet. A half-sandwich or bowl of soup is an example of a good first meal. Heavy or fried foods are harder to digest and may make you feel nauseous or bloated. Drink plenty of fluids but you should avoid alcoholic beverages for 24 hours.   ACTIVITY: Your care partner should take you home directly after the procedure. You should plan to take it easy, moving slowly for the rest of the day. You can resume normal activity the day after the procedure however YOU SHOULD NOT DRIVE, use power tools, machinery or perform tasks that involve climbing or major physical exertion for 24 hours (because of the sedation medicines used during the test).   SYMPTOMS TO REPORT IMMEDIATELY: A gastroenterologist can be reached at any hour. Please call [phone]  for any of the following symptoms:  Following lower endoscopy (colonoscopy, flexible sigmoidoscopy) Excessive amounts of blood in the stool  Significant tenderness, worsening of abdominal pains  Swelling of the abdomen that is new, acute  Fever of 100 or higher  Following upper endoscopy (EGD, EUS, ERCP, esophageal  dilation) Vomiting of blood or coffee ground material  New, significant abdominal pain  New, significant chest pain or pain under the shoulder blades  Painful or persistently difficult swallowing  New shortness of breath  Black, tarry-looking or red, bloody stools  FOLLOW UP:  If any biopsies were taken you will be contacted by phone or by letter within the next 1-3 weeks. Call [phone]  if you have not heard about the biopsies in 3 weeks.  Please also call with any specific questions about appointments or follow up tests.

## 2021-06-02 NOTE — H&P (Signed)
GASTROENTEROLOGY PROCEDURE H&P NOTE   Primary Care Physician: [name], [name], MD  HPI: Erik Nicholson is a 71 y.o. male who presents for EGD/EUS for follow-up of gastric nodule and gastric intestinal metaplasia and colonoscopy for history of IBD greater than 10 years pan UC (last colonoscopy 2 years ago normal).  Past Medical History:  Diagnosis Date   Anemia    Arthritis    BPH (benign prostatic hyperplasia)    CAD (coronary artery disease)    x 7 cardiac stents   Chronic back pain    L4/5 with chronic right leg pain    COVID-19    10/20/20   [place], vitreous, left    Hx of hepatitis C    treated with Harvoni in 2014   Hyperlipidemia    Hypertension    Neuromuscular disorder ([place])    nerve damage in back   Neuropathy    Prostate cancer ([place])    PVD (peripheral vascular disease) ([place])    2 stents right leg and 1 in left    Ulcerative colitis ([place])    with diarrhea   Past Surgical History:  Procedure Laterality Date   back surgery     x 2 lumbar last 09/2016 in [place], BILATERAL     COLONOSCOPY     last 09/2018 in [place] N/A 03/21/2020   Procedure: [place];  Surgeon: Nelva Bush, MD;  Location: [facility];  Service: Cardiovascular;  Laterality: N/A;   HERNIA REPAIR     right    INTRAVASCULAR PRESSURE WIRE/FFR STUDY N/A 03/21/2020   Procedure: INTRAVASCULAR PRESSURE WIRE/FFR STUDY;  Surgeon: Nelva Bush, MD;  Location: [facility];  Service: Cardiovascular;  Laterality: N/A;   LEFT HEART CATH AND CORONARY ANGIOGRAPHY N/A 03/21/2020   Procedure: LEFT HEART CATH AND CORONARY ANGIOGRAPHY;  Surgeon: Nelva Bush, MD;  Location: [facility];  Service: Cardiovascular;  Laterality: N/A;   PENILE PROSTHESIS IMPLANT     PERCUTANEOUS CORONARY STENT INTERVENTION (PCI-S)     x 7-8 heart   PERIPHERAL ARTERIAL STENT  GRAFT     PROSTATE BIOPSY     pvd     with stenting x 2 right leg below knee and 1 left thigh    SHOULDER ARTHROSCOPY Bilateral    VITRECTOMY     right eye   Current Facility-Administered Medications  Medication Dose Route Frequency Provider Last Rate Last Admin   0.9 %  sodium chloride infusion   Intravenous Continuous Mansouraty, Telford Nab., MD       lactated ringers infusion    Continuous PRN Mansouraty, Telford Nab., MD 10 mL/hr at 06/02/21 1002 1,000 mL at 06/02/21 1002    Current Facility-Administered Medications:    0.9 %  sodium chloride infusion, , Intravenous, Continuous, Mansouraty, Telford Nab., MD   lactated ringers infusion, , , Continuous PRN, Mansouraty, Telford Nab., MD, Last Rate: 10 mL/hr at 06/02/21 1002, 1,000 mL at 06/02/21 1002 Allergies  Allergen Reactions   Cymbalta [Duloxetine Hcl] Other (See Comments)    drowsiness   Family History  Problem Relation Age of Onset   Heart disease Mother    Stroke Mother    Breast cancer Mother    Diabetes Sister    Diabetes Brother    Colon cancer Neg Hx    Esophageal cancer Neg Hx    Inflammatory bowel disease Neg Hx  Liver disease Neg Hx    Pancreatic cancer Neg Hx    Rectal cancer Neg Hx    Stomach cancer Neg Hx    Social History   Socioeconomic History   Marital status: Married    Spouse name: Not on file   Number of children: 2   Years of education: Not on file   Highest education level: Not on file  Occupational History   Not on file  Tobacco Use   Smoking status: Former    Packs/day: 2.00    Years: 40.00    Pack years: 80.00    Types: Cigarettes    Quit date: 04/20/1998    Years since quitting: 23.1   Smokeless tobacco: Never   Tobacco comments:    quit in 2000s   Vaping Use   Vaping Use: Never used  Substance and Sexual Activity   Alcohol use: Yes    Comment: occasional   Drug use: Not Currently   Sexual activity: Yes  Other Topics Concern   Not on file  Social History Narrative    Moved from [place] in 10/2018   2 sons in 33s as of 11/24/2018    Married wife is [name] x 45 years as of 04/2019   Social Determinants of Radio broadcast assistant Strain: Low Risk    Difficulty of Paying Living Expenses: Not hard at all  Food Insecurity: No Food Insecurity   Worried About Charity fundraiser in the Last Year: Never true   Arboriculturist in the Last Year: Never true  Transportation Needs: No Transportation Needs   Lack of Transportation (Medical): No   Lack of Transportation (Non-Medical): No  Physical Activity: Not on file  Stress: No Stress Concern Present   Feeling of Stress : Not at all  Social Connections: Unknown   Frequency of Communication with Friends and Family: Not on file   Frequency of Social Gatherings with Friends and Family: Not on file   Attends Religious Services: Not on file   Active Member of Clubs or Organizations: Not on file   Attends Archivist Meetings: Not on file   Marital Status: Married  Human resources officer Violence: Not At Risk   Fear of Current or Ex-Partner: No   Emotionally Abused: No   Physically Abused: No   Sexually Abused: No    Physical Exam: Today's Vitals   06/02/21 0955  BP: (!) 152/84  Pulse: 83  Resp: 12  Temp: 98.3 F (36.8 C)  TempSrc: Temporal  SpO2: 100%  Weight: 81.6 kg  Height: 5' 7"  (1.702 m)  PainSc: 0-No pain   Body mass index is 28.19 kg/m. GEN: NAD EYE: Sclerae anicteric ENT: MMM CV: Non-tachycardic GI: Soft, NT/ND NEURO:  Alert & Oriented x 3  Lab Results: No results for input(s): WBC, HGB, HCT, PLT in the last 72 hours. BMET No results for input(s): NA, K, CL, CO2, GLUCOSE, BUN, CREATININE, CALCIUM in the last 72 hours. LFT No results for input(s): PROT, ALBUMIN, AST, ALT, ALKPHOS, BILITOT, BILIDIR, IBILI in the last 72 hours. PT/INR No results for input(s): LABPROT, INR in the last 72 hours.   Impression / Plan: This is a 71 y.o.male who presents for EGD/EUS  for follow-up of gastric nodule and gastric intestinal metaplasia and colonoscopy for history of IBD greater than 10 years pan UC (last colonoscopy 2 years ago normal).  The risks and benefits of endoscopic evaluation/treatment were discussed with the patient and/or  family; these include but are not limited to the risk of perforation, infection, bleeding, missed lesions, lack of diagnosis, severe illness requiring hospitalization, as well as anesthesia and sedation related illnesses.  The patient's history has been reviewed, patient examined, no change in status, and deemed stable for procedure.  The patient and/or family is agreeable to proceed.    Justice Britain, MD [facility] Office # [phone]

## 2021-06-02 NOTE — Anesthesia Preprocedure Evaluation (Signed)
Anesthesia Evaluation  Patient identified by MRN, date of birth, ID band Patient awake    Reviewed: Allergy & Precautions, NPO status , Patient's Chart, lab work & pertinent test results  Airway Mallampati: II  TM Distance: >3 FB Neck ROM: Full    Dental no notable dental hx.    Pulmonary neg pulmonary ROS, former smoker,    Pulmonary exam normal breath sounds clear to auscultation       Cardiovascular hypertension, + CAD, + Cardiac Stents and + Peripheral Vascular Disease  Normal cardiovascular exam Rhythm:Regular Rate:Normal     Neuro/Psych negative neurological ROS  negative psych ROS   GI/Hepatic negative GI ROS, Neg liver ROS,   Endo/Other  negative endocrine ROS  Renal/GU negative Renal ROS  negative genitourinary   Musculoskeletal negative musculoskeletal ROS (+)   Abdominal   Peds negative pediatric ROS (+)  Hematology negative hematology ROS (+)   Anesthesia Other Findings   Reproductive/Obstetrics negative OB ROS                             Anesthesia Physical Anesthesia Plan  ASA: 3  Anesthesia Plan: MAC   Post-op Pain Management: Minimal or no pain anticipated   Induction: Intravenous  PONV Risk Score and Plan: 1 and Propofol infusion and Treatment may vary due to age or medical condition  Airway Management Planned: Simple Face Mask  Additional Equipment:   Intra-op Plan:   Post-operative Plan:   Informed Consent: I have reviewed the patients History and Physical, chart, labs and discussed the procedure including the risks, benefits and alternatives for the proposed anesthesia with the patient or authorized representative who has indicated his/her understanding and acceptance.     Dental advisory given  Plan Discussed with: CRNA and Surgeon  Anesthesia Plan Comments:         Anesthesia Quick Evaluation

## 2021-06-03 ENCOUNTER — Encounter: Payer: Self-pay | Admitting: Gastroenterology

## 2021-06-03 LAB — SURGICAL PATHOLOGY

## 2021-06-03 NOTE — Anesthesia Postprocedure Evaluation (Signed)
Anesthesia Post Note  Patient: Erik Nicholson  Procedure(s) Performed: ESOPHAGOGASTRODUODENOSCOPY (EGD) WITH PROPOFOL COLONOSCOPY WITH PROPOFOL UPPER ESOPHAGEAL ENDOSCOPIC ULTRASOUND (EUS) BIOPSY     Patient location during evaluation: PACU Anesthesia Type: MAC Level of consciousness: awake and alert Pain management: pain level controlled Vital Signs Assessment: post-procedure vital signs reviewed and stable Respiratory status: spontaneous breathing, nonlabored ventilation, respiratory function stable and patient connected to nasal cannula oxygen Cardiovascular status: stable and blood pressure returned to baseline Postop Assessment: no apparent nausea or vomiting Anesthetic complications: no   No notable events documented.  Last Vitals:  Vitals:   06/02/21 1310 06/02/21 1320  BP: (!) 146/83 (!) 148/78  Pulse: 73 74  Resp: 20 16  Temp:    SpO2: 98% 100%    Last Pain:  Vitals:   06/02/21 1320  TempSrc:   PainSc: 0-No pain                 Lahoma Constantin S

## 2021-06-04 ENCOUNTER — Encounter (HOSPITAL_COMMUNITY): Payer: Self-pay | Admitting: Gastroenterology

## 2021-06-18 ENCOUNTER — Telehealth: Payer: Self-pay | Admitting: Internal Medicine

## 2021-06-18 ENCOUNTER — Emergency Department
Admission: EM | Admit: 2021-06-18 | Discharge: 2021-06-18 | Disposition: A | Discharge status: Home / Self Care | Payer: Medicare Other | Attending: Emergency Medicine | Admitting: Emergency Medicine

## 2021-06-18 ENCOUNTER — Encounter: Payer: Self-pay | Admitting: Emergency Medicine

## 2021-06-18 ENCOUNTER — Other Ambulatory Visit: Payer: Self-pay

## 2021-06-18 DIAGNOSIS — I10 Essential (primary) hypertension: Secondary | ICD-10-CM | POA: Insufficient documentation

## 2021-06-18 DIAGNOSIS — I2510 Atherosclerotic heart disease of native coronary artery without angina pectoris: Secondary | ICD-10-CM | POA: Diagnosis not present

## 2021-06-18 DIAGNOSIS — Z8546 Personal history of malignant neoplasm of prostate: Secondary | ICD-10-CM | POA: Insufficient documentation

## 2021-06-18 DIAGNOSIS — M5441 Lumbago with sciatica, right side: Secondary | ICD-10-CM | POA: Insufficient documentation

## 2021-06-18 DIAGNOSIS — M545 Low back pain, unspecified: Secondary | ICD-10-CM | POA: Diagnosis present

## 2021-06-18 MED ORDER — OXYCODONE HCL 5 MG PO TABS: 5.0000 mg | ORAL_TABLET | Freq: Four times a day (QID) | ORAL | 0 refills | Status: DC | PRN

## 2021-06-18 NOTE — ED Triage Notes (Signed)
Patient ambulatory to triage with steady gait, without difficulty or distress noted with aid of crutches; st hx chronic back pain; since Sunday having increased lower back pain radiating down rt leg ?

## 2021-06-18 NOTE — Telephone Encounter (Signed)
Ronalee Belts from [place], [phone], Fax was sent back to get both signatures dated. The insurance requires that both signatures have dates. Please fax back. ?

## 2021-06-18 NOTE — Telephone Encounter (Signed)
Pt wanted the provider to know that he went to the emergency room today for right sided low back pain. Pt want to see if he can see a different doctor then what the provider referred him to last time. Pt was not happy with that provider. Pt want to be seen ASAP ?

## 2021-06-18 NOTE — ED Provider Notes (Signed)
? ?[facility] ?Provider Note ? ? ? Event Date/Time  ? First MD Initiated Contact with Patient 06/18/21 [phone]   ?  (approximate) ? ? ?History  ? ?Back Pain ? ? ?HPI ? ?Erik Nicholson is a 71 y.o. male history of chronic low back pain, hepatitis C, peripheral vascular disease ulcerative colitis coronary disease with multiple stents several years of chronic low back pain lumbar region ? ?He has been experiencing a flareup of his low back pain.  Its pain over his right lower back and lumbar region.  Reports he is experiences off-and-on many times for throughout the last several years.  He has had previous lumbar surgery, and recently had pain injections over the right side a couple weeks ago.  The pain will subside but then he will start with a flareup and start with a burning searing discomfort right of his low spine needs to develops a feeling of a water dripping or unusual hard to describe feeling that we will run down the side of his right back and his right buttock and the back of his right leg. ? ?He still has good strength.  He is still able to feel any has not lost any sense of touch.  His bowel and bladder habits have remained normal.  No incontinence ? ?No abdominal pain no nausea or fevers. ? ?Reports that he typically uses oxycodone during these types of flareups and has tried multiple other treatment strategies.  Feels like if he could get pain medication he be able to contact his primary care and reach out to our pain control clinic ?  ?Past Medical History:  ?Diagnosis Date  ? Anemia   ? Arthritis   ? BPH (benign prostatic hyperplasia)   ? CAD (coronary artery disease)   ? x 7 cardiac stents  ? Chronic back pain   ? L4/5 with chronic right leg pain   ? COVID-19   ? 10/20/20  ? Floater, vitreous, left   ? Hx of hepatitis C   ? treated with Harvoni in 2014  ? Hyperlipidemia   ? Hypertension   ? Neuromuscular disorder ([place])   ? nerve damage in back  ? Neuropathy   ? Prostate cancer ([place])    ? PVD (peripheral vascular disease) ([place])   ? 2 stents right leg and 1 in left   ? Ulcerative colitis ([place])   ? with diarrhea  ? ? ? ?Reviewed recent notes and Procedure:  ?06/03/2021: Right L3-4 and right L2-3 transforaminal ESI ? ?Physical Exam  ? ?Triage Vital Signs: ?ED Triage Vitals  ?Enc Vitals Group  ?   BP 06/18/21 0619 (!) 149/76  ?   Pulse Rate 06/18/21 0619 72  ?   Resp 06/18/21 0619 20  ?   Temp 06/18/21 0619 98.2 ?F (36.8 ?C)  ?   Temp Source 06/18/21 0619 Oral  ?   SpO2 06/18/21 0619 100 %  ?   Weight 06/18/21 0617 180 lb (81.6 kg)  ?   Height 06/18/21 0617 5' 7"  (1.702 m)  ?   Head Circumference --   ?   Peak Flow --   ?   Pain Score 06/18/21 0617 10  ?   Pain Loc --   ?   Pain Edu? --   ?   Excl. in [place]? --   ? ? ?Most recent vital signs: ?Vitals:  ? 06/18/21 0619 06/18/21 0630  ?BP: (!) 149/76 127/77  ?Pulse: 72 75  ?Resp:  20   ?Temp: 98.2 ?F (36.8 ?C)   ?SpO2: 100% 100%  ? ? ? ?General: Awake, no distress.  ?CV:  Good peripheral perfusion.  ?Resp:  Normal effort.  ?Abd:  No distention.  ?Other:  Patient reports pain to palpation along the right inferior and lateral portions of the lumbar paraspinous region and some discomfort in the mid lumbar region.  Denies pain on the left.  He is in relatively good little pain when sitting but when he attempts to stand he reports putting pressure or standing tends to make the pain worse in his right lower back.  Normal and palpable dorsalis pedis pulse with normal skin color of the right lower extremity.  He reports normal sensation of the bilateral lower extremities.  Normal sensation across the buttock ? ? ? ? ?ED Results / Procedures / Treatments  ? ?Labs ?(all labs ordered are listed, but only abnormal results are displayed) ?Labs Reviewed - No data to display ? ? ?EKG ? ? ? ? ?RADIOLOGY ? ?Reviewed patient's MRI of his lumbar spine from January of this year, Minor thoracic degenerative changes.  Postoperative multilevel degenerative changes of the lumbar  spine.  There is no high-grade stenosis ? ? ?PROCEDURES: ? ?Critical Care performed: No ? ?Procedures ? ? ?MEDICATIONS ORDERED IN ED: ?Medications - No data to display ? ? ?IMPRESSION / MDM / ASSESSMENT AND PLAN / ED COURSE  ?I reviewed the triage vital signs and the nursing notes. ?             ?               ? ?Differential diagnosis includes, but is not limited to, exacerbation of chronic lumbar ago, herniated nucleus pulposus, referred discomfort for intra-abdominal etiology, vascular abnormality etc. ? ?Patient reports a chronic history of waxing and waning symptoms of same nature.  His exam is very reassuring.  Neurovascular intact distally.  No associated abdominal pain.  No infectious symptoms.  He reports this pain is, on and off many times over the course of several years of the same nature.  Currently does not have prescription for breakthrough pain currently only using Tylenol.  Discussed risks and benefits of oxycodone as well as careful precautions around not using more than prescribed and not driving while taking it he is understanding agreeable.  We will follow-up with a pain control physician.  Based upon the severity and the intermittent nature of his flareups of same nature, I do not suspect cauda equina, septic hematoma, septic arthritis, epidural hematoma or other obvious acute cause but did discuss careful return precautions with the patient which include to return right away if he develops a fever, severe worsening of pain, develops numbness weakness or inability to use the lower leg or feels like his limb is becoming weak in any way.  He is agreeable with this ? ?We did discuss additional imaging and work-up such as obtaining another MRI here to evaluate for worsening or signs of a worsening pinching of the nerve that could lead to injury or deadening of the limb, but patient reports this is very typical of his pain and he would prefer outpatient treatment with pain control which I think is  quite reasonable ? ?I will prescribe the patient a narcotic pain medicine due to their condition which I anticipate will cause at least moderate pain short term. I discussed with the patient safe use of narcotic pain medicines, and that they are not to drive, work  in dangerous areas, or ever take more than prescribed (no more than 1 pill every 6 hours). We discussed that this is the type of medication that can be  overdosed on and the risks of this type of medicine. Patient is very agreeable to only use as prescribed and to never use more than prescribed. ? ? ?The patient is on the cardiac monitor to evaluate for evidence of arrhythmia and/or significant heart rate changes. ? ?Return precautions and treatment recommendations and follow-up discussed with the patient who is agreeable with the plan. ? ?  ? ? ?FINAL CLINICAL IMPRESSION(S) / ED DIAGNOSES  ? ?Final diagnoses:  ?Acute right-sided low back pain with right-sided sciatica  ? ? ? ?Rx / DC Orders  ? ?ED Discharge Orders   ? ?      Ordered  ?  oxyCODONE (OXY IR/ROXICODONE) 5 MG immediate release tablet  Every 6 hours PRN       ? 06/18/21 0731  ? ?  ?  ? ?  ? ? ? ?Note:  This document was prepared using Dragon voice recognition software and may include unintentional dictation errors. ?  Delman Kitten, MD ?06/18/21 [phone] ? ?

## 2021-06-18 NOTE — Telephone Encounter (Signed)
Vaughan Basta want to be called regarding some requirements from [employer] ?

## 2021-06-18 NOTE — Telephone Encounter (Signed)
Pain clinic look who insurance covers and will refer either [place], [place], [place]  ? ?If wants back injections can do [facility] in [place] (with pain clinic) or [place]  ? ?Back issues are chronic if wants pain med needs to see pain clinic  ? ?[place] pain clinic I think he went to ?

## 2021-06-18 NOTE — Telephone Encounter (Signed)
Called to inform that new paperwork had been faxed and receipt confirmed this morning. Vaughan Basta states that new information is needed for approval of the power wheel chair.  ? ?States that the Grip strength of the Patient and the pain scale for the wrist needs to be added to the last note. Currently the way note is now, Patient's grip strength and wrist are normal, showing there is no reason he would not be able to use a manual wheelchair. Also needing the noted addended to say why the Patient need a power wheelchair in the home (ie. To get to kitchen, etc).  ? ?Vaughan Basta states that since the last note has been changed and signed so many times, in order for the order to be approved the note changes have to be underlined and dated. For each change made on each date, what part of the note that was added or changed will need to be underlined with the date it was changed/added beside the underlined portion. So far note has been changed and signed four times so there should be at least four underlined sections with different dates.  ? ?They newly requested information above will also need to be underlined and dated once added to the note.  ?

## 2021-06-18 NOTE — Telephone Encounter (Signed)
Please advise on new referral  ?

## 2021-06-19 NOTE — Telephone Encounter (Signed)
Patient called and would like a referral to pain management in [place]. ?

## 2021-06-20 ENCOUNTER — Telehealth: Payer: Self-pay | Admitting: Internal Medicine

## 2021-06-20 NOTE — Telephone Encounter (Signed)
? ?  Pre-operative Risk Assessment  ?  ?Patient Name: Erik Nicholson  ?DOB: 1950-12-19 ?MRN: [id number]  ? ?  ? ?Request for Surgical Clearance   ? ?Procedure:   Removal and replacement of inflatable penile prosthesis  ? ?Date of Surgery:  Clearance TBD                              ?   ?Surgeon:  Mayer Camel ?Surgeon's Group or Practice Name:  [facility] ?Phone number:  [phone] ?Fax number:  [phone] ?  ?Type of Clearance Requested:   ?Medical ?  ?Type of Anesthesia:  Not Indicated ?  ?Additional requests/questions:  Please advise surgeon/provider what medications should be held. ? ?Signed, ?Caryl Pina Gerringer   ?06/20/2021, 3:49 PM  ? ?

## 2021-06-23 ENCOUNTER — Telehealth: Payer: Self-pay | Admitting: *Deleted

## 2021-06-23 ENCOUNTER — Ambulatory Visit (INDEPENDENT_AMBULATORY_CARE_PROVIDER_SITE_OTHER): Payer: Medicare Other | Admitting: General Practice

## 2021-06-23 ENCOUNTER — Encounter: Payer: Self-pay | Admitting: General Practice

## 2021-06-23 DIAGNOSIS — Z01810 Encounter for preprocedural cardiovascular examination: Secondary | ICD-10-CM

## 2021-06-23 NOTE — Telephone Encounter (Signed)
Preoperative team, please contact this patient and set up a phone visit for further cardiac evaluation.  Thank you for your help. ? ?Jossie Ng. Carrina Schoenberger NP-C ? ?  ?06/23/2021, 10:31 AM ?[place] ?[address] ?Office [phone] Fax [phone] ? ?

## 2021-06-23 NOTE — Progress Notes (Signed)
Virtual Visit via Telephone Note   This visit type was conducted due to national recommendations for restrictions regarding the COVID-19 Pandemic (e.g. social distancing) in an effort to limit this patient's exposure and mitigate transmission in our community.  Due to his co-morbid illnesses, this patient is at least at moderate risk for complications without adequate follow up.  This format is felt to be most appropriate for this patient at this time.  The patient did not have access to video technology/had technical difficulties with video requiring transitioning to audio format only (telephone).  All issues noted in this document were discussed and addressed.  No physical exam could be performed with this format.  Please refer to the patient's chart for his  consent to telehealth for Erik Nicholson. Evaluation Performed:  Preoperative cardiovascular risk assessment  This visit type was conducted due to national recommendations for restrictions regarding the COVID-19 Pandemic (e.g. social distancing).  This format is felt to be most appropriate for this patient at this time.  All issues noted in this document were discussed and addressed.  No physical exam was performed (except for noted visual exam findings with Video Visits).  Please refer to the patient's chart (MyChart message for video visits and phone note for telephone visits) for the patient's consent to telehealth for Erik Nicholson. _____________   Date:  06/23/2021   Patient ID:  Erik Nicholson, DOB 03/11/1951, MRN [id number] Patient Location:  Home Provider location:   Office  Primary Care Provider:  [name], [name], MD Primary Cardiologist:  Nelva Bush, MD  Chief Complaint    71 y.o. y/o male with a h/o hypertension coronary artery disease, chronic heart failure preserved ejection fraction, who is pending removal and replacement of inflatable penile prosthesis, and presents today for telephonic preoperative  cardiovascular risk assessment.  Past Medical History    Past Medical History:  Diagnosis Date   Anemia    Arthritis    BPH (benign prostatic hyperplasia)    CAD (coronary artery disease)    x 7 cardiac stents   Chronic back pain    L4/5 with chronic right leg pain    COVID-19    10/20/20   Floater, vitreous, left    Hx of hepatitis C    treated with Harvoni in 2014   Hyperlipidemia    Hypertension    Neuromuscular disorder ([place])    nerve damage in back   Neuropathy    Prostate cancer ([place])    PVD (peripheral vascular disease) ([place])    2 stents right leg and 1 in left    Ulcerative colitis ([place])    with diarrhea   Past Surgical History:  Procedure Laterality Date   back surgery     x 2 lumbar last 09/2016 in [place]  06/02/2021   Procedure: BIOPSY;  Surgeon: Irving Copas., MD;  Location: [facility];  Service: Gastroenterology;;  EGD and COLON   CARDIAC CATHETERIZATION     CATARACT EXTRACTION, BILATERAL     COLONOSCOPY     last 09/2018 in [place] N/A 06/02/2021   Procedure: COLONOSCOPY WITH PROPOFOL;  Surgeon: Irving Copas., MD;  Location: [facility];  Service: Gastroenterology;  Laterality: N/A;   CORONARY BALLOON ANGIOPLASTY N/A 03/21/2020   Procedure: CORONARY BALLOON ANGIOPLASTY;  Surgeon: Nelva Bush, MD;  Location: [place] [facility];  Service: Cardiovascular;  Laterality: N/A;   ESOPHAGOGASTRODUODENOSCOPY (EGD) WITH PROPOFOL N/A 06/02/2021   Procedure:  ESOPHAGOGASTRODUODENOSCOPY (EGD) WITH PROPOFOL;  Surgeon: Mansouraty, Telford Nab., MD;  Location: [facility];  Service: Gastroenterology;  Laterality: N/A;   HERNIA REPAIR     right    INTRAVASCULAR PRESSURE WIRE/FFR STUDY N/A 03/21/2020   Procedure: INTRAVASCULAR PRESSURE WIRE/FFR STUDY;  Surgeon: Nelva Bush, MD;  Location: [place] [facility];  Service: Cardiovascular;  Laterality: N/A;   LEFT HEART CATH AND CORONARY ANGIOGRAPHY  N/A 03/21/2020   Procedure: LEFT HEART CATH AND CORONARY ANGIOGRAPHY;  Surgeon: Nelva Bush, MD;  Location: [place] [facility];  Service: Cardiovascular;  Laterality: N/A;   PENILE PROSTHESIS IMPLANT     PERCUTANEOUS CORONARY STENT INTERVENTION (PCI-S)     x 7-8 heart   PERIPHERAL ARTERIAL STENT GRAFT     PROSTATE BIOPSY     pvd     with stenting x 2 right leg below knee and 1 left thigh    SHOULDER ARTHROSCOPY Bilateral    UPPER ESOPHAGEAL ENDOSCOPIC ULTRASOUND (EUS) N/A 06/02/2021   Procedure: UPPER ESOPHAGEAL ENDOSCOPIC ULTRASOUND (EUS);  Surgeon: Irving Copas., MD;  Location: [facility];  Service: Gastroenterology;  Laterality: N/A;   VITRECTOMY     right eye    Allergies  Allergies  Allergen Reactions   Cymbalta [Duloxetine Hcl] Other (See Comments)    drowsiness    History of Present Illness    Erik Nicholson is a 71 y.o. male who presents via audio/video conferencing for a telehealth visit today.  Pt was last seen in cardiology clinic on 03/10/21, by Christell Faith, PA-C.  At that time Erik Nicholson was doing well .  he is now pending removal and replacement of inflatable penile prosthesis.  Since his last visit, he continues to be stable from a cardiac standpoint.  He has no cardiac complaints at this time.  I will route his preoperative cardiac evaluation to [facility] urology; Dr. Mayer Camel.   Home Medications    Prior to Admission medications   Medication Sig Start Date End Date Taking? Authorizing Provider  acetaminophen (TYLENOL) 500 MG tablet Take 500-1,000 mg by mouth every 6 (six) hours as needed (pain.).   Yes [provider]  amLODipine (NORVASC) 10 MG tablet Take 1 tablet (10 mg total) by mouth daily. 11/05/20  Yes [name], [name], MD  atorvastatin (LIPITOR) 40 MG tablet Take 1 tablet (40 mg total) by mouth daily. 05/06/21  Yes End, Harrell Gave, MD  clopidogrel (PLAVIX) 75 MG tablet Take 1 tablet (75 mg total) by mouth daily. 11/27/20  Yes  Dunn, Areta Haber, PA-C  cyclobenzaprine (FLEXERIL) 5 MG tablet TAKE 1-2 TABLETS (5-10 MG TOTAL) BY MOUTH AT BEDTIME AS NEEDED FOR MUSCLE SPASMS. Patient taking differently: Take 10 mg by mouth at bedtime. 09/17/20  Yes [name], [name], MD  furosemide (LASIX) 20 MG tablet Take 20 mg by mouth daily as needed for fluid.   Yes [provider]  hyoscyamine (LEVBID) 0.375 MG 12 hr tablet Take 1 tablet (0.375 mg total) by mouth every 12 (twelve) hours as needed. 03/25/21  Yes Mansouraty, Telford Nab., MD  lidocaine (LIDODERM) 5 % Place 1 patch onto the skin daily as needed (pain). Remove patch after 12 hours. 06/17/18  Yes [provider]  mesalamine (LIALDA) 1.2 g EC tablet Take 2 tablets (2.4 g total) by mouth daily with breakfast. 03/25/21  Yes Mansouraty, Telford Nab., MD  metoprolol succinate (TOPROL-XL) 100 MG 24 hr tablet TAKE 1 TABLET BY MOUTH DAILY. TAKE WITH OR IMMEDIATELY FOLLOWING A MEAL. 02/24/21  Yes End, Har[place] Gave, MD  nitroGLYCERIN (NITROSTAT) 0.4 MG SL tablet TAKE 1 TABLET BY MOUTH SUBLINGUALLY EVERY 5 MINS AS NEEDED FOR CHEST PAIN. IF >1 NEEDED, CALL 911 11/12/20  Yes [name], [name], MD  oxyCODONE (OXY IR/ROXICODONE) 5 MG immediate release tablet Take 1 tablet (5 mg total) by mouth every 6 (six) hours as needed for severe pain. 06/18/21  Yes Delman Kitten, MD  pantoprazole (PROTONIX) 40 MG tablet Take 1 tablet (40 mg total) by mouth 2 (two) times daily before a meal. 06/02/21 06/02/22 Yes Mansouraty, Telford Nab., MD  potassium chloride (MICRO-K) 10 MEQ CR capsule Take 2 capsules (20 mEq total) by mouth daily. D/c tablet 05/09/21  Yes [name], [name], MD  RESTASIS 0.05 % ophthalmic emulsion Place 1 drop into both eyes See admin instructions. Instill 1 drop into both eyes (scheduled) every morning & may instill in the evening if needed for eye irritation/dryness 09/14/18  Yes [provider]  telmisartan-hydrochlorothiazide (MICARDIS HCT) 80-12.5 MG tablet  Take 1 tablet by mouth daily. In am d/c 40-12.5 mg dose 08/06/20  Yes [name], [name], MD    Physical Exam    Vital Signs:  Dia Jefferys does not have vital signs available for review today.  Given telephonic nature of communication, physical exam is limited. AAOx3. NAD. Normal affect.  Speech and respirations are unlabored.  Accessory Clinical Findings    None  Assessment & Plan    1.  Preoperative Cardiovascular Risk Assessment:  Chart reviewed as part of pre-operative protocol coverage. Given past medical history and time since last visit, based on ACC/AHA guidelines, Tydarius Yawn would be at acceptable risk for the planned procedure without further cardiovascular testing.   His RCRI is a class II risk, 0.9% risk of major cardiac event.  He is able to complete greater than 4 METS of physical activity.   Patient was advised that if he develops new symptoms prior to surgery to contact our office to arrange a follow-up appointment.  He verbalized understanding.    COVID-19 Education: The signs and symptoms of COVID-19 were discussed with the patient and how to seek care for testing (follow up with PCP or arrange E-visit).  The importance of social distancing was discussed today.  Patient Risk:   After full review of this patient's history and clinical status, I feel that he is at least moderate risk for cardiac complications at this time, thus necessitating a telehealth visit sooner than our first available in office visit.  Time:   Today, I have spent 10 minutes with the patient with telehealth technology discussing medical history, symptoms, and management plan.     Deberah Pelton, NP  06/23/2021, 2:34 PM

## 2021-06-23 NOTE — Telephone Encounter (Signed)
I s/w the pt today and he is agreeable to televisit appt @ 4 pm with Coletta Memos, FNP. Med rec and consent has been done.   ?

## 2021-06-23 NOTE — Telephone Encounter (Signed)
?  Patient Consent for Virtual Visit  ? ? ?   ? ?Erik Nicholson has provided verbal consent on 06/23/2021 for a virtual visit (video or telephone). ? ? ?CONSENT FOR VIRTUAL VISIT FOR:  Erik Nicholson  ?By participating in this virtual visit I agree to the following: ? ?I hereby voluntarily request, consent and authorize [facility] and its employed or contracted physicians, physician assistants, nurse practitioners or other licensed health care professionals (the Practitioner), to provide me with telemedicine health care services (the ?Services") as deemed necessary by the treating Practitioner. I acknowledge and consent to receive the Services by the Practitioner via telemedicine. I understand that the telemedicine visit will involve communicating with the Practitioner through live audiovisual communication technology and the disclosure of certain medical information by electronic transmission. I acknowledge that I have been given the opportunity to request an in-person assessment or other available alternative prior to the telemedicine visit and am voluntarily participating in the telemedicine visit. ? ?I understand that I have the right to withhold or withdraw my consent to the use of telemedicine in the course of my care at any time, without affecting my right to future care or treatment, and that the Practitioner or I may terminate the telemedicine visit at any time. I understand that I have the right to inspect all information obtained and/or recorded in the course of the telemedicine visit and may receive copies of available information for a reasonable fee.  I understand that some of the potential risks of receiving the Services via telemedicine include:  ?Delay or interruption in medical evaluation due to technological equipment failure or disruption; ?Information transmitted may not be sufficient (e.g. poor resolution of images) to allow for appropriate medical decision making by the Practitioner; and/or   ?In rare instances, security protocols could fail, causing a breach of personal health information. ? ?Furthermore, I acknowledge that it is my responsibility to provide information about my medical history, conditions and care that is complete and accurate to the best of my ability. I acknowledge that Practitioner's advice, recommendations, and/or decision may be based on factors not within their control, such as incomplete or inaccurate data provided by me or distortions of diagnostic images or specimens that may result from electronic transmissions. I understand that the practice of medicine is not an exact science and that Practitioner makes no warranties or guarantees regarding treatment outcomes. I acknowledge that a copy of this consent can be made available to me via my patient portal ([facility]), or I can request a printed copy by calling the office of [facility].   ? ?I understand that my insurance will be billed for this visit.  ? ?I have read or had this consent read to me. ?I understand the contents of this consent, which adequately explains the benefits and risks of the Services being provided via telemedicine.  ?I have been provided ample opportunity to ask questions regarding this consent and the Services and have had my questions answered to my satisfaction. ?I give my informed consent for the services to be provided through the use of telemedicine in my medical care ? ?  ?

## 2021-06-23 NOTE — Telephone Encounter (Signed)
For your information  

## 2021-06-24 NOTE — Addendum Note (Signed)
Addended by: Orland Mustard on: 06/24/2021 01:08 PM   Modules accepted: Orders

## 2021-06-24 NOTE — Telephone Encounter (Signed)
Referral to Dr. Roena Malady if rejected pt has to call and she whos covered with insurance and let me know for pain clinic  ?

## 2021-06-24 NOTE — Addendum Note (Signed)
Addended by: Deberah Pelton on: 06/24/2021 11:10 AM ? ? Modules accepted: Level of Service ? ?

## 2021-06-26 NOTE — Telephone Encounter (Signed)
Noted  

## 2021-07-09 ENCOUNTER — Other Ambulatory Visit: Payer: Self-pay | Admitting: Neurosurgery

## 2021-07-09 DIAGNOSIS — M5416 Radiculopathy, lumbar region: Secondary | ICD-10-CM

## 2021-07-09 DIAGNOSIS — Z981 Arthrodesis status: Secondary | ICD-10-CM

## 2021-07-10 ENCOUNTER — Ambulatory Visit (INDEPENDENT_AMBULATORY_CARE_PROVIDER_SITE_OTHER): Payer: Medicare Other

## 2021-07-10 VITALS — Ht 67.0 in | Wt 180.0 lb

## 2021-07-10 DIAGNOSIS — Z0000 Encounter for general adult medical examination without abnormal findings: Secondary | ICD-10-CM

## 2021-07-10 NOTE — Patient Instructions (Addendum)
[name] , Thank you for taking time to come for your Medicare Wellness Visit. I appreciate your ongoing commitment to your health goals. Please review the following plan we discussed and let me know if I can assist you in the future.   These are the goals we discussed:  Goals       Patient Stated     Increase physical activity (pt-stated)      I would like to walk more for exercise when I can.        This is a list of the screening recommended for you and due dates:  Health Maintenance  Topic Date Due   Zoster (Shingles) Vaccine (1 of 2) 10/10/2021*   COVID-19 Vaccine (5 - Booster for Pfizer series) 04/20/2022*   Pneumonia Vaccine (1 - PCV) 07/11/2022*   Tetanus Vaccine  07/11/2022*   Colon Cancer Screening  06/02/2022   Flu Shot  Completed   Hepatitis C Screening: USPSTF Recommendation to screen - Ages 18-79 yo.  Completed   HPV Vaccine  Aged Out  *Topic was postponed. The date shown is not the original due date.    Opioid Pain Medicine Management Opioids are powerful medicines that are used to treat moderate to severe pain. When used for short periods of time, they can help you to: Sleep better. Do better in physical or occupational therapy. Feel better in the first few days after an injury. Recover from surgery. Opioids should be taken with the supervision of a trained health care provider. They should be taken for the shortest period of time possible. This is because opioids can be addictive, and the longer you take opioids, the greater your risk of addiction. This addiction can also be called opioid use disorder. What are the risks? Using opioid pain medicines for longer than 3 days increases your risk of side effects. Side effects include: Constipation. Nausea and vomiting. Breathing difficulties (respiratory depression). Drowsiness. Confusion. Opioid use disorder. Itching. Taking opioid pain medicine for a long period of time can affect your ability to do  daily tasks. It also puts you at risk for: Motor vehicle crashes. Depression. Suicide. Heart attack. Overdose, which can be life-threatening. What is a pain treatment plan? A pain treatment plan is an agreement between you and your health care provider. Pain is unique to each person, and treatments vary depending on your condition. To manage your pain, you and your health care provider need to work together. To help you do this: Discuss the goals of your treatment, including how much pain you might expect to have and how you will manage the pain. Review the risks and benefits of taking opioid medicines. Remember that a good treatment plan uses more than one approach and minimizes the chance of side effects. Be honest about the amount of medicines you take and about any drug or alcohol use. Get pain medicine prescriptions from only one health care provider. Pain can be managed with many types of alternative treatments. Ask your health care provider to refer you to one or more specialists who can help you manage pain through: Physical or occupational therapy. Counseling (cognitive behavioral therapy). Good nutrition. Biofeedback. Massage. Meditation. Non-opioid medicine. Following a gentle exercise program. How to use opioid pain medicine Taking medicine Take your pain medicine exactly as told by your health care provider. Take it only when you need it. If your pain gets less severe, you may take less than your prescribed dose if your health care provider approves. If  you are not having pain, do nottake pain medicine unless your health care provider tells you to take it. If your pain is severe, do nottry to treat it yourself by taking more pills than instructed on your prescription. Contact your health care provider for help. Write down the times when you take your pain medicine. It is easy to become confused while on pain medicine. Writing the time can help you avoid overdose. Take other  over-the-counter or prescription medicines only as told by your health care provider. Keeping yourself and others safe  While you are taking opioid pain medicine: Do not drive, use machinery, or power tools. Do not sign legal documents. Do not drink alcohol. Do not take sleeping pills. Do not supervise children by yourself. Do not do activities that require climbing or being in high places. Do not go to a lake, river, ocean, spa, or swimming pool. Do not share your pain medicine with anyone. Keep pain medicine in a locked cabinet or in a secure area where pets and children cannot reach it. Stopping your use of opioids If you have been taking opioid medicine for more than a few weeks, you may need to slowly decrease (taper) how much you take until you stop completely. Tapering your use of opioids can decrease your risk of symptoms of withdrawal, such as: Pain and cramping in the abdomen. Nausea. Sweating. Sleepiness. Restlessness. Uncontrollable shaking (tremors). Cravings for the medicine. Do not attempt to taper your use of opioids on your own. Talk with your health care provider about how to do this. Your health care provider may prescribe a step-down schedule based on how much medicine you are taking and how long you have been taking it. Getting rid of leftover pills Do not save any leftover pills. Get rid of leftover pills safely by: Taking the medicine to a prescription take-back program. This is usually offered by the county or law enforcement. Bringing them to a pharmacy that has a drug disposal container. Flushing them down the toilet. Check the label or package insert of your medicine to see whether this is safe to do. Throwing them out in the trash. Check the label or package insert of your medicine to see whether this is safe to do. If it is safe to throw it out, remove the medicine from the original container, put it into a sealable bag or container, and mix it with used  coffee grounds, food scraps, dirt, or cat litter before putting it in the trash. Follow these instructions at home: Activity Do exercises as told by your health care provider. Avoid activities that make your pain worse. Return to your normal activities as told by your health care provider. Ask your health care provider what activities are safe for you. General instructions You may need to take these actions to prevent or treat constipation: Drink enough fluid to keep your urine pale yellow. Take over-the-counter or prescription medicines. Eat foods that are high in fiber, such as beans, whole grains, and fresh fruits and vegetables. Limit foods that are high in fat and processed sugars, such as fried or sweet foods. Keep all follow-up visits. This is important. Where to find support If you have been taking opioids for a long time, you may benefit from receiving support for quitting from a local support group or counselor. Ask your health care provider for a referral to these resources in your area. Where to find more information Centers for Disease Control and Prevention (CDC): [employer]Exhibition.[employer].br [place] Food  and Drug Administration (FDA): PumpkinSearch.com.ee Get help right away if: You may have taken too much of an opioid (overdosed). Common symptoms of an overdose: Your breathing is slower or more shallow than normal. You have a very slow heartbeat (pulse). You have slurred speech. You have nausea and vomiting. Your pupils become very small. You have other potential symptoms: You are very confused. You faint or feel like you will faint. You have cold, clammy skin. You have blue lips or fingernails. You have thoughts of harming yourself or harming others. These symptoms may represent a serious problem that is an emergency. Do not wait to see if the symptoms will go away. Get medical help right away. Call your local emergency services (911 in the [place] Do not drive yourself to the hospital.  If you  ever feel like you may hurt yourself or others, or have thoughts about taking your own life, get help right away. Go to your nearest emergency department or: Call your local emergency services (911 in the [place] Call the [facility] (601-168-7494 in the [place] Call a suicide crisis helpline, such as the National Suicide Prevention Lifeline at (747)694-9565 or 988 in the [place] This is open 24 hours a day in the [place] Text the Crisis Text Line at 214-383-5057 (in the [place] Summary Opioid medicines can help you manage moderate to severe pain for a short period of time. A pain treatment plan is an agreement between you and your health care provider. Discuss the goals of your treatment, including how much pain you might expect to have and how you will manage the pain. If you think that you or someone else may have taken too much of an opioid, get medical help right away. This information is not intended to replace advice given to you by your health care provider. Make sure you discuss any questions you have with your health care provider. Document Revised: 10/30/2020 Document Reviewed: 07/17/2020 Elsevier Patient Education  2022 [employer].

## 2021-07-10 NOTE — Progress Notes (Signed)
Subjective:   Erik Nicholson is a 71 y.o. male who presents for Medicare Annual/Subsequent preventive examination.  Review of Systems    No ROS.  Medicare Wellness Virtual Visit.  Visual/audio telehealth visit, UTA vital signs.   See social history for additional risk factors.   Cardiac Risk Factors include: advanced age (>70men, >33 women);male gender;hypertension     Objective:    Today's Vitals   07/10/21 0841  Weight: 180 lb (81.6 kg)  Height: 5\' 7"  (1.702 m)   Body mass index is 28.19 kg/m.     07/10/2021    8:44 AM 06/18/2021    6:19 AM 06/02/2021    9:49 AM 10/02/2020    6:50 AM 07/09/2020   11:39 AM 03/21/2020    9:26 AM 05/17/2019    1:01 PM  Advanced Directives  Does Patient Have a Medical Advance Directive? Yes Yes Yes No Yes Yes Yes  Type of Estate agent of [place];Living will Living will;Healthcare Power of [employer] Power of [place];Living will  Healthcare Power of [employer] of [place];Living will Healthcare Power of [place];Living will  Does patient want to make changes to medical advance directive? No - Patient declined No - Patient declined   No - Patient declined  No - Patient declined  Copy of Healthcare Power of Attorney in Chart? No - copy requested No - copy requested Yes - validated most recent copy scanned in chart (See row information)  No - copy requested    Would patient like information on creating a medical advance directive?  No - Patient declined  No - Patient declined       Current Medications (verified) Outpatient Encounter Medications as of 07/10/2021  Medication Sig   acetaminophen (TYLENOL) 500 MG tablet Take 500-1,000 mg by mouth every 6 (six) hours as needed (pain.).   amLODipine (NORVASC) 10 MG tablet Take 1 tablet (10 mg total) by mouth daily.   atorvastatin (LIPITOR) 40 MG tablet Take 1 tablet (40 mg total) by mouth daily.   clopidogrel (PLAVIX) 75 MG tablet Take 1 tablet (75 mg total)  by mouth daily.   cyclobenzaprine (FLEXERIL) 5 MG tablet TAKE 1-2 TABLETS (5-10 MG TOTAL) BY MOUTH AT BEDTIME AS NEEDED FOR MUSCLE SPASMS. (Patient taking differently: Take 10 mg by mouth at bedtime.)   furosemide (LASIX) 20 MG tablet Take 20 mg by mouth daily as needed for fluid.   hyoscyamine (LEVBID) 0.375 MG 12 hr tablet Take 1 tablet (0.375 mg total) by mouth every 12 (twelve) hours as needed.   lidocaine (LIDODERM) 5 % Place 1 patch onto the skin daily as needed (pain). Remove patch after 12 hours.   mesalamine (LIALDA) 1.2 g EC tablet Take 2 tablets (2.4 g total) by mouth daily with breakfast.   metoprolol succinate (TOPROL-XL) 100 MG 24 hr tablet TAKE 1 TABLET BY MOUTH DAILY. TAKE WITH OR IMMEDIATELY FOLLOWING A MEAL.   nitroGLYCERIN (NITROSTAT) 0.4 MG SL tablet TAKE 1 TABLET BY MOUTH SUBLINGUALLY EVERY 5 MINS AS NEEDED FOR CHEST PAIN. IF >1 NEEDED, CALL 911   oxyCODONE (OXY IR/ROXICODONE) 5 MG immediate release tablet Take 1 tablet (5 mg total) by mouth every 6 (six) hours as needed for severe pain.   pantoprazole (PROTONIX) 40 MG tablet Take 1 tablet (40 mg total) by mouth 2 (two) times daily before a meal.   potassium chloride (MICRO-K) 10 MEQ CR capsule Take 2 capsules (20 mEq total) by mouth daily. D/c tablet   RESTASIS  0.05 % ophthalmic emulsion Place 1 drop into both eyes See admin instructions. Instill 1 drop into both eyes (scheduled) every morning & may instill in the evening if needed for eye irritation/dryness   telmisartan-hydrochlorothiazide (MICARDIS HCT) 80-12.5 MG tablet Take 1 tablet by mouth daily. In am d/c 40-12.5 mg dose   No facility-administered encounter medications on file as of 07/10/2021.    Allergies (verified) Cymbalta [duloxetine hcl]   History: Past Medical History:  Diagnosis Date   Anemia    Arthritis    BPH (benign prostatic hyperplasia)    CAD (coronary artery disease)    x 7 cardiac stents   Chronic back pain    L4/5 with chronic right leg pain     COVID-19    10/20/20   Floater, vitreous, left    Hx of hepatitis C    treated with Harvoni in 2014   Hyperlipidemia    Hypertension    Neuromuscular disorder (HCC)    nerve damage in back   Neuropathy    Prostate cancer (HCC)    PVD (peripheral vascular disease) (HCC)    2 stents right leg and 1 in left    Ulcerative colitis (HCC)    with diarrhea   Past Surgical History:  Procedure Laterality Date   back surgery     x 2 lumbar last 09/2016 in [place]   BIOPSY  06/02/2021   Procedure: BIOPSY;  Surgeon: Lemar Lofty., MD;  Location: [facility];  Service: Gastroenterology;;  EGD and COLON   CARDIAC CATHETERIZATION     CATARACT EXTRACTION, BILATERAL     COLONOSCOPY     last 09/2018 in [place]   COLONOSCOPY WITH PROPOFOL N/A 06/02/2021   Procedure: COLONOSCOPY WITH PROPOFOL;  Surgeon: Lemar Lofty., MD;  Location: [facility];  Service: Gastroenterology;  Laterality: N/A;   CORONARY BALLOON ANGIOPLASTY N/A 03/21/2020   Procedure: CORONARY BALLOON ANGIOPLASTY;  Surgeon: Yvonne Kendall, MD;  [facility];  Service: Cardiovascular;  Laterality: N/A;   ESOPHAGOGASTRODUODENOSCOPY (EGD) WITH PROPOFOL N/A 06/02/2021   Procedure: ESOPHAGOGASTRODUODENOSCOPY (EGD) WITH PROPOFOL;  Surgeon: Meridee Score Netty Starring., MD;  Location: [facility];  Service: Gastroenterology;  Laterality: N/A;   HERNIA REPAIR     right    INTRAVASCULAR PRESSURE WIRE/FFR STUDY N/A 03/21/2020   Procedure: INTRAVASCULAR PRESSURE WIRE/FFR STUDY;  Surgeon: Yvonne Kendall, MD;  [facility];  Service: Cardiovascular;  Laterality: N/A;   LEFT HEART CATH AND CORONARY ANGIOGRAPHY N/A 03/21/2020   Procedure: LEFT HEART CATH AND CORONARY ANGIOGRAPHY;  Surgeon: Yvonne Kendall, MD;  [facility];  Service: Cardiovascular;  Laterality: N/A;   PENILE PROSTHESIS IMPLANT     PERCUTANEOUS CORONARY STENT INTERVENTION (PCI-S)     x 7-8 heart    PERIPHERAL ARTERIAL STENT GRAFT     PROSTATE BIOPSY     pvd     with stenting x 2 right leg below knee and 1 left thigh    SHOULDER ARTHROSCOPY Bilateral    UPPER ESOPHAGEAL ENDOSCOPIC ULTRASOUND (EUS) N/A 06/02/2021   Procedure: UPPER ESOPHAGEAL ENDOSCOPIC ULTRASOUND (EUS);  Surgeon: Lemar Lofty., MD;  Location: [facility];  Service: Gastroenterology;  Laterality: N/A;   VITRECTOMY     right eye   Family History  Problem Relation Age of Onset   Heart disease Mother    Stroke Mother    Breast cancer Mother    Diabetes Sister    Diabetes Brother    Colon cancer  Neg Hx    Esophageal cancer Neg Hx    Inflammatory bowel disease Neg Hx    Liver disease Neg Hx    Pancreatic cancer Neg Hx    Rectal cancer Neg Hx    Stomach cancer Neg Hx    Social History   Socioeconomic History   Marital status: Married    Spouse name: Not on file   Number of children: 2   Years of education: Not on file   Highest education level: Not on file  Occupational History   Not on file  Tobacco Use   Smoking status: Former    Packs/day: 2.00    Years: 40.00    Pack years: 80.00    Types: Cigarettes    Quit date: 04/20/1998    Years since quitting: 23.2   Smokeless tobacco: Never   Tobacco comments:    quit in 2000s   Vaping Use   Vaping Use: Never used  Substance and Sexual Activity   Alcohol use: Yes    Comment: occasional   Drug use: Not Currently   Sexual activity: Yes  Other Topics Concern   Not on file  Social History Narrative   Moved from [place] in 10/2018   2 sons in 72s as of 11/24/2018    Married wife is DPR Britta Mccreedy x 45 years as of 04/2019   Social Determinants of Corporate investment banker Strain: Not on file  Food Insecurity: Not on file  Transportation Needs: Not on file  Physical Activity: Not on file  Stress: Not on file  Social Connections: Not on file    Tobacco Counseling Counseling given: Not Answered Tobacco comments: quit in 2000s     Clinical Intake:  Pre-visit preparation completed: No        Diabetes: No  How often do you need to have someone help you when you read instructions, pamphlets, or other written materials from your doctor or pharmacy?: 1 - Never    Interpreter Needed?: No      Activities of Daily Living    07/10/2021    8:48 AM  In your present state of health, do you have any difficulty performing the following activities:  Hearing? 0  Vision? 0  Difficulty concentrating or making decisions? 0  Walking or climbing stairs? 1  Comment Crutches in use when ambulating  Dressing or bathing? 0  Doing errands, shopping? 0  Preparing Food and eating ? N  Using the Toilet? N  In the past six months, have you accidently leaked urine? Y  Comment Followed by Urology  Do you have problems with loss of bowel control? N  Managing your Medications? N  Managing your Finances? N  Housekeeping or managing your Housekeeping? N    Patient Care Team: [name], Pasty Spillers, MD as PCP - General (Internal Medicine) End, Cristal Deer, MD as PCP - Cardiology (Cardiology) Ihor Gully, MD (Inactive) as Consulting Physician (Urology) Margaretmary Dys, MD as Consulting Physician (Radiation Oncology) Felicita Gage, RN Nurse Navigator as Registered Nurse (Medical Oncology)  Indicate any recent Medical Services you may have received from other than [facility] providers in the past year (date may be approximate).     Assessment:   This is a routine wellness examination for [place].  Virtual Visit via Telephone Note  I connected with  Erik Nicholson on 07/10/21 at  9:45 AM EDT by telephone and verified that I am speaking with the correct person using two identifiers.  Persons participating in  the virtual visit: patient/Nurse Health Advisor   I discussed the limitations of evaluation and management service by telehealth. The patient expressed understanding and agreed to proceed. We continued and completed visit  with audio only. Some vital signs may be absent or patient reported.   Hearing/Vision screen Hearing Screening - Comments:: Patient is able to hear conversational tones without difficulty.  No issues reported.  Vision Screening - Comments:: Wears corrective lenses They have seen their ophthalmologist in the last 12 months.    Dietary issues and exercise activities discussed: Current Exercise Habits: The patient does not participate in regular exercise at present Regular diet   Goals Addressed               This Visit's Progress     Patient Stated     Increase physical activity (pt-stated)        I would like to walk more for exercise when I can.       Depression Screen    07/10/2021    8:48 AM 02/06/2021   11:24 AM 07/09/2020   11:39 AM 08/04/2019   10:34 AM 05/03/2019   10:38 AM 01/17/2019    9:09 AM 11/24/2018   10:04 AM  PHQ 2/9 Scores  PHQ - 2 Score 0 0 0 0 0 0 0    Fall Risk    07/10/2021    8:45 AM 02/06/2021   11:24 AM 11/05/2020    3:25 PM 08/06/2020    9:14 AM 07/09/2020   11:39 AM  Fall Risk   Falls in the past year?  1 1 0 0  Number falls in past yr:  1 0 0 0  Injury with Fall?  1 1 0 0  Risk for fall due to : Impaired balance/gait History of fall(s) History of fall(s)    Follow up Falls evaluation completed Falls evaluation completed Falls evaluation completed Falls evaluation completed Falls evaluation completed    FALL RISK PREVENTION PERTAINING TO THE HOME: Home free of loose throw rugs in walkways, pet beds, electrical cords, etc? Yes  Adequate lighting in your home to reduce risk of falls? Yes   ASSISTIVE DEVICES UTILIZED TO PREVENT FALLS: Life alert? No  Use of a cane, walker or w/c? Yes , crutches  TIMED UP AND GO: Was the test performed? No .   Cognitive Function:  Patient is alert and oriented x3.  Normal cognitive status assessed by direct observation/communication.No abnormalities found.        07/09/2020   11:43 AM  6CIT Screen   What Year? 0 points  What month? 0 points  What time? 0 points  Count back from 20 0 points  Months in reverse 0 points  Repeat phrase 0 points  Total Score 0 points    Immunizations Immunization History  Administered Date(s) Administered   Fluad Quad(high Dose 65+) 01/04/2019, 01/29/2020, 02/05/2021   Influenza, High Dose Seasonal PF 01/13/2018   PFIZER Comirnaty(Gray Top)Covid-19 Tri-Sucrose Vaccine 02/05/2021   PFIZER(Purple Top)SARS-COV-2 Vaccination 06/18/2019, 07/12/2019, 03/29/2020    TDAP status: Due, Education has been provided regarding the importance of this vaccine. Advised may receive this vaccine at local pharmacy or Health Dept. Aware to provide a copy of the vaccination record if obtained from local pharmacy or Health Dept. Verbalized acceptance and understanding. Deferred.   Pneumococcal vaccine status: Due, Education has been provided regarding the importance of this vaccine. Advised may receive this vaccine at local pharmacy or Health Dept. Aware to provide a copy of  the vaccination record if obtained from local pharmacy or Health Dept. Verbalized acceptance and understanding. Deferred.  Shingrix Completed?: No.    Education has been provided regarding the importance of this vaccine. Patient has been advised to call insurance company to determine out of pocket expense if they have not yet received this vaccine. Advised may also receive vaccine at local pharmacy or Health Dept. Verbalized acceptance and understanding.  Screening Tests Health Maintenance  Topic Date Due   Zoster Vaccines- Shingrix (1 of 2) 10/10/2021 (Originally 09/19/1969)   COVID-19 Vaccine (5 - Booster for Pfizer series) 04/20/2022 (Originally 04/02/2021)   Pneumonia Vaccine 62+ Years old (1 - PCV) 07/11/2022 (Originally 09/18/2013)   TETANUS/TDAP  07/11/2022 (Originally 09/19/1969)   COLONOSCOPY (Pts 45-63yrs Insurance coverage will need to be confirmed)  06/02/2022   INFLUENZA VACCINE  Completed    Hepatitis C Screening  Completed   HPV VACCINES  Aged Out   Health Maintenance There are no preventive care reminders to display for this patient.  Lung Cancer Screening: completed 10/02/20.  Hepatitis C Screening: Completed   Vision Screening: Recommended annual ophthalmology exams for early detection of glaucoma and other disorders of the eye.  Dental Screening: Recommended annual dental exams for proper oral hygiene  Community Resource Referral / Chronic Care Management: CRR required this visit?  No   CCM required this visit?  No      Plan:   Keep all routine maintenance appointments.   I have personally reviewed and noted the following in the patient's chart:   Medical and social history Use of alcohol, tobacco or illicit drugs  Current medications and supplements including opioid prescriptions. Patient is currently taking opioid prescriptions. Information provided to patient regarding non-opioid alternatives. Patient advised to discuss non-opioid treatment plan with their provider. [facility]. Functional ability and status Nutritional status Physical activity Advanced directives List of other physicians Hospitalizations, surgeries, and ER visits in previous 12 months Vitals Screenings to include cognitive, depression, and falls Referrals and appointments  In addition, I have reviewed and discussed with patient certain preventive protocols, quality metrics, and best practice recommendations. A written personalized care plan for preventive services as well as general preventive health recommendations were provided to patient.     Ashok Pall, LPN   [id number]

## 2021-07-13 ENCOUNTER — Other Ambulatory Visit: Payer: Self-pay | Admitting: Gastroenterology

## 2021-07-15 ENCOUNTER — Encounter: Payer: Self-pay | Admitting: Internal Medicine

## 2021-07-15 ENCOUNTER — Other Ambulatory Visit: Payer: Self-pay

## 2021-07-15 ENCOUNTER — Ambulatory Visit (INDEPENDENT_AMBULATORY_CARE_PROVIDER_SITE_OTHER): Payer: Medicare Other | Admitting: Internal Medicine

## 2021-07-15 ENCOUNTER — Telehealth: Payer: Self-pay | Admitting: Internal Medicine

## 2021-07-15 VITALS — BP 112/70 | HR 65 | Temp 97.4°F | Ht 67.0 in | Wt 196.0 lb

## 2021-07-15 DIAGNOSIS — Z8546 Personal history of malignant neoplasm of prostate: Secondary | ICD-10-CM | POA: Diagnosis not present

## 2021-07-15 DIAGNOSIS — M898X9 Other specified disorders of bone, unspecified site: Secondary | ICD-10-CM

## 2021-07-15 DIAGNOSIS — M5416 Radiculopathy, lumbar region: Secondary | ICD-10-CM

## 2021-07-15 DIAGNOSIS — R739 Hyperglycemia, unspecified: Secondary | ICD-10-CM

## 2021-07-15 DIAGNOSIS — R1084 Generalized abdominal pain: Secondary | ICD-10-CM

## 2021-07-15 DIAGNOSIS — R972 Elevated prostate specific antigen [PSA]: Secondary | ICD-10-CM

## 2021-07-15 DIAGNOSIS — E876 Hypokalemia: Secondary | ICD-10-CM

## 2021-07-15 DIAGNOSIS — R140 Abdominal distension (gaseous): Secondary | ICD-10-CM

## 2021-07-15 DIAGNOSIS — M2550 Pain in unspecified joint: Secondary | ICD-10-CM

## 2021-07-15 DIAGNOSIS — M5136 Other intervertebral disc degeneration, lumbar region: Secondary | ICD-10-CM

## 2021-07-15 DIAGNOSIS — R600 Localized edema: Secondary | ICD-10-CM

## 2021-07-15 DIAGNOSIS — I10 Essential (primary) hypertension: Secondary | ICD-10-CM

## 2021-07-15 DIAGNOSIS — G894 Chronic pain syndrome: Secondary | ICD-10-CM

## 2021-07-15 DIAGNOSIS — N179 Acute kidney failure, unspecified: Secondary | ICD-10-CM

## 2021-07-15 LAB — LIPID PANEL
Cholesterol: 143 mg/dL (ref 0–200)
HDL: 44.4 mg/dL (ref >39.00)
NonHDL: 98.18
Total CHOL/HDL Ratio: 3
Triglycerides: 216 mg/dL — ABNORMAL HIGH (ref 0.0–149.0)
VLDL: 43.2 mg/dL — ABNORMAL HIGH (ref 0.0–40.0)

## 2021-07-15 LAB — BASIC METABOLIC PANEL
BUN: 21 mg/dL (ref 6–23)
CO2: 27 mEq/L (ref 19–32)
Calcium: 10 mg/dL (ref 8.4–10.5)
Chloride: 104 mEq/L (ref 96–112)
Creatinine, Ser: 1.3 mg/dL (ref 0.40–1.50)
GFR: 54.76 mL/min — ABNORMAL LOW (ref >60.00)
Glucose, Bld: 118 mg/dL — ABNORMAL HIGH (ref 70–99)
Potassium: 4.4 mEq/L (ref 3.5–5.1)
Sodium: 140 mEq/L (ref 135–145)

## 2021-07-15 LAB — PSA: PSA: 0.13 ng/mL (ref 0.10–4.00)

## 2021-07-15 LAB — HEMOGLOBIN A1C: Hgb A1c MFr Bld: 5.8 % (ref 4.6–6.5)

## 2021-07-15 LAB — LDL CHOLESTEROL, DIRECT: Direct LDL: 69 mg/dL

## 2021-07-15 MED ORDER — AMLODIPINE BESYLATE 5 MG PO TABS: 5.0000 mg | ORAL_TABLET | Freq: Every day | ORAL | 3 refills | Status: DC

## 2021-07-15 MED ORDER — CYCLOBENZAPRINE HCL 10 MG PO TABS: 10.0000 mg | ORAL_TABLET | Freq: Two times a day (BID) | ORAL | 2 refills | Status: DC | PRN

## 2021-07-15 NOTE — Patient Instructions (Addendum)
Call me back with name and mg of pain medications from the pain clinic  ? ? ?Retirement benefits I.e health insurance specialist Erik Nicholson [phone] ? ?[place] ?For CT abdomen/pelvis  ?Address: [address] [place], [place] [address] ? ?Phone: [phone] ? ?

## 2021-07-15 NOTE — Telephone Encounter (Signed)
Pt called in for medication Xtamtzaer 68m capsules every 12 hours. Pt said that the provider needed the medication name ? ?

## 2021-07-15 NOTE — Progress Notes (Signed)
Chief Complaint  ?Patient presents with  ? Follow-up  ? ?F/u  ?1. Low back pain f/u [facility] Dr. Lacinda Axon 07/18/21 Ct lumbar insurance did not approve his scooter wheelchair today walking with cane pain 8/10 seeing dr. [facility] pain clinic on extended release pain medication  ?Wearing back brace as well  ?C/o pain in lower legs last vascular US 2021 h/o PAD with patent stents advised could be referred from low back  ? ?2. Leg edema on norvasc 10 mg qd will cut back to 5  ?Htn controlled on norvasc 10 and telmisartan hctz 80-12.5 mg qd  ? ?3. Ab pain and bloating new h/o prostate caner and elevated PSA will image pt ok with this  ? ? ?Review of Systems  ?Constitutional:  Negative for weight loss.  ?HENT:  Negative for hearing loss.   ?Eyes:  Negative for blurred vision.  ?Respiratory:  Negative for shortness of breath.   ?Cardiovascular:  Positive for leg swelling. Negative for chest pain.  ?Gastrointestinal:  Positive for abdominal pain. Negative for blood in stool.  ?Musculoskeletal:  Negative for back pain.  ?Skin:  Negative for rash.  ?Neurological:  Negative for headaches.  ?Psychiatric/Behavioral:  Negative for depression.   ?Past Medical History:  ?Diagnosis Date  ? Anemia   ? Arthritis   ? BPH (benign prostatic hyperplasia)   ? CAD (coronary artery disease)   ? x 7 cardiac stents  ? Chronic back pain   ? L4/5 with chronic right leg pain   ? COVID-19   ? 10/20/20  ? Floater, vitreous, left   ? Hx of hepatitis C   ? treated with Harvoni in 2014  ? Hyperlipidemia   ? Hypertension   ? Neuromuscular disorder ([place])   ? nerve damage in back  ? Neuropathy   ? Prostate cancer ([place])   ? PVD (peripheral vascular disease) ([place])   ? 2 stents right leg and 1 in left   ? Ulcerative colitis ([place])   ? with diarrhea  ? ?Past Surgical History:  ?Procedure Laterality Date  ? back surgery    ? x 2 lumbar last 09/2016 in [place]  ? BIOPSY  06/02/2021  ? Procedure: BIOPSY;  Surgeon: Irving Copas., MD;  Location: [facility];   Service: Gastroenterology;;  EGD and COLON  ? CARDIAC CATHETERIZATION    ? CATARACT EXTRACTION, BILATERAL    ? COLONOSCOPY    ? last 09/2018 in [place]  ? COLONOSCOPY WITH PROPOFOL N/A 06/02/2021  ? Procedure: COLONOSCOPY WITH PROPOFOL;  Surgeon: Mansouraty, Telford Nab., MD;  Location: [facility];  Service: Gastroenterology;  Laterality: N/A;  ? CORONARY BALLOON ANGIOPLASTY N/A 03/21/2020  ? Procedure: CORONARY BALLOON ANGIOPLASTY;  Surgeon: Nelva Bush, MD;  Location: [facility];  Service: Cardiovascular;  Laterality: N/A;  ? ESOPHAGOGASTRODUODENOSCOPY (EGD) WITH PROPOFOL N/A 06/02/2021  ? Procedure: ESOPHAGOGASTRODUODENOSCOPY (EGD) WITH PROPOFOL;  Surgeon: Rush Landmark Telford Nab., MD;  Location: [facility];  Service: Gastroenterology;  Laterality: N/A;  ? HERNIA REPAIR    ? right   ? INTRAVASCULAR PRESSURE WIRE/FFR STUDY N/A 03/21/2020  ? Procedure: INTRAVASCULAR PRESSURE WIRE/FFR STUDY;  Surgeon: Nelva Bush, MD;  Location: [facility];  Service: Cardiovascular;  Laterality: N/A;  ? LEFT HEART CATH AND CORONARY ANGIOGRAPHY N/A 03/21/2020  ? Procedure: LEFT HEART CATH AND CORONARY ANGIOGRAPHY;  Surgeon: Nelva Bush, MD;  Location: [facility];  Service: Cardiovascular;  Laterality: N/A;  ? PENILE PROSTHESIS IMPLANT    ? PERCUTANEOUS  CORONARY STENT INTERVENTION (PCI-S)    ? x 7-8 heart  ? PERIPHERAL ARTERIAL STENT GRAFT    ? PROSTATE BIOPSY    ? pvd    ? with stenting x 2 right leg below knee and 1 left thigh   ? SHOULDER ARTHROSCOPY Bilateral   ? UPPER ESOPHAGEAL ENDOSCOPIC ULTRASOUND (EUS) N/A 06/02/2021  ? Procedure: UPPER ESOPHAGEAL ENDOSCOPIC ULTRASOUND (EUS);  Surgeon: Irving Copas., MD;  Location: [facility];  Service: Gastroenterology;  Laterality: N/A;  ? VITRECTOMY    ? right eye  ? ?Family History  ?Problem Relation Age of Onset  ? Heart disease Mother   ? Stroke Mother   ? Breast cancer Mother   ? Diabetes Sister   ? Diabetes Brother   ? Colon  cancer Neg Hx   ? Esophageal cancer Neg Hx   ? Inflammatory bowel disease Neg Hx   ? Liver disease Neg Hx   ? Pancreatic cancer Neg Hx   ? Rectal cancer Neg Hx   ? Stomach cancer Neg Hx   ? ?Social History  ? ?Socioeconomic History  ? Marital status: Married  ?  Spouse name: Not on file  ? Number of children: 2  ? Years of education: Not on file  ? Highest education level: Not on file  ?Occupational History  ? Not on file  ?Tobacco Use  ? Smoking status: Former  ?  Packs/day: 2.00  ?  Years: 40.00  ?  Pack years: 80.00  ?  Types: Cigarettes  ?  Quit date: 04/20/1998  ?  Years since quitting: 23.2  ? Smokeless tobacco: Never  ? Tobacco comments:  ?  quit in 2000s   ?Vaping Use  ? Vaping Use: Never used  ?Substance and Sexual Activity  ? Alcohol use: Yes  ?  Comment: occasional  ? Drug use: Not Currently  ? Sexual activity: Yes  ?Other Topics Concern  ? Not on file  ?Social History Narrative  ? Moved from [place] in 10/2018  ? 2 sons in 43s as of 11/24/2018   ? Married wife is DPR Barbara x 45 years as of 04/2019  ? ?Social Determinants of Health  ? ?Financial Resource Strain: Not on file  ?Food Insecurity: Not on file  ?Transportation Needs: Not on file  ?Physical Activity: Not on file  ?Stress: Not on file  ?Social Connections: Not on file  ?Intimate Partner Violence: Not on file  ? ?Current Meds  ?Medication Sig  ? acetaminophen (TYLENOL) 500 MG tablet Take 500-1,000 mg by mouth every 6 (six) hours as needed (pain.).  ? atorvastatin (LIPITOR) 40 MG tablet Take 1 tablet (40 mg total) by mouth daily.  ? clopidogrel (PLAVIX) 75 MG tablet Take 1 tablet (75 mg total) by mouth daily.  ? furosemide (LASIX) 20 MG tablet Take 20 mg by mouth daily as needed for fluid.  ? hyoscyamine (LEVBID) 0.375 MG 12 hr tablet TAKE 1 TABLET BY MOUTH EVERY 12 HOURS AS NEEDED  ? lidocaine (LIDODERM) 5 % Place 1 patch onto the skin daily as needed (pain). Remove patch after 12 hours.  ? mesalamine (LIALDA) 1.2 g EC tablet Take 2 tablets  (2.4 g total) by mouth daily with breakfast.  ? metoprolol succinate (TOPROL-XL) 100 MG 24 hr tablet TAKE 1 TABLET BY MOUTH DAILY. TAKE WITH OR IMMEDIATELY FOLLOWING A MEAL.  ? oxyCODONE (OXY IR/ROXICODONE) 5 MG immediate release tablet Take 1 tablet (5 mg total) by mouth every 6 (six) hours as  needed for severe pain.  ? pantoprazole (PROTONIX) 40 MG tablet Take 1 tablet (40 mg total) by mouth 2 (two) times daily before a meal.  ? potassium chloride (MICRO-K) 10 MEQ CR capsule Take 2 capsules (20 mEq total) by mouth daily. D/c tablet  ? RESTASIS 0.05 % ophthalmic emulsion Place 1 drop into both eyes See admin instructions. Instill 1 drop into both eyes (scheduled) every morning & may instill in the evening if needed for eye irritation/dryness  ? telmisartan-hydrochlorothiazide (MICARDIS HCT) 80-12.5 MG tablet Take 1 tablet by mouth daily. In am d/c 40-12.5 mg dose  ? [DISCONTINUED] amLODipine (NORVASC) 10 MG tablet Take 1 tablet (10 mg total) by mouth daily.  ? [DISCONTINUED] cyclobenzaprine (FLEXERIL) 5 MG tablet TAKE 1-2 TABLETS (5-10 MG TOTAL) BY MOUTH AT BEDTIME AS NEEDED FOR MUSCLE SPASMS. (Patient taking differently: Take 10 mg by mouth at bedtime.)  ? ?Allergies  ?Allergen Reactions  ? Cymbalta [Duloxetine Hcl] Other (See Comments)  ?  drowsiness  ? ?Recent Results (from the past 2160 hour(s))  ?Comprehensive metabolic panel     Status: Abnormal  ? Collection Time: 04/30/21  3:27 PM  ?Result Value Ref Range  ? Sodium 140 135 - 145 mEq/L  ? Potassium 3.3 (L) 3.5 - 5.1 mEq/L  ? Chloride 104 96 - 112 mEq/L  ? CO2 30 19 - 32 mEq/L  ? Glucose, Bld 98 70 - 99 mg/dL  ? BUN 12 6 - 23 mg/dL  ? Creatinine, Ser 0.91 0.40 - 1.50 mg/dL  ? Total Bilirubin 0.8 0.2 - 1.2 mg/dL  ? Alkaline Phosphatase 65 39 - 117 U/L  ? AST 27 0 - 37 U/L  ? ALT 24 0 - 53 U/L  ? Total Protein 7.8 6.0 - 8.3 g/dL  ? Albumin 4.3 3.5 - 5.2 g/dL  ? GFR 84.14 >60.00 mL/min  ?  Comment: Calculated using the CKD-EPI Creatinine Equation (2021)  ?  Calcium 9.8 8.4 - 10.5 mg/dL  ?D-Dimer, Quantitative     Status: None  ? Collection Time: 04/30/21  3:27 PM  ?Result Value Ref Range  ? D-Dimer, Quant 0.36 <0.50 mcg/mL FEU  ?  Comment: . ?The D-Dimer test is used

## 2021-07-16 ENCOUNTER — Encounter: Payer: Self-pay | Admitting: Internal Medicine

## 2021-07-16 DIAGNOSIS — R7303 Prediabetes: Secondary | ICD-10-CM | POA: Insufficient documentation

## 2021-07-16 NOTE — Addendum Note (Signed)
Addended by: Orland Mustard on: 07/16/2021 04:39 PM ? ? Modules accepted: Orders ? ?

## 2021-07-17 NOTE — Telephone Encounter (Signed)
For your information  

## 2021-07-17 NOTE — Telephone Encounter (Signed)
Please add to medication list ?Thank you ? ?

## 2021-07-18 ENCOUNTER — Other Ambulatory Visit: Payer: Self-pay

## 2021-07-18 ENCOUNTER — Ambulatory Visit
Admission: RE | Admit: 2021-07-18 | Discharge: 2021-07-18 | Disposition: A | Discharge status: Home / Self Care | Payer: Medicare Other | Source: Ambulatory Visit | Attending: Neurosurgery | Admitting: Neurosurgery

## 2021-07-18 DIAGNOSIS — M5416 Radiculopathy, lumbar region: Secondary | ICD-10-CM | POA: Diagnosis present

## 2021-07-18 DIAGNOSIS — Z981 Arthrodesis status: Secondary | ICD-10-CM | POA: Diagnosis present

## 2021-07-18 NOTE — Telephone Encounter (Signed)
Medication added to Patient list  ?

## 2021-07-20 ENCOUNTER — Other Ambulatory Visit: Payer: Self-pay | Admitting: Gastroenterology

## 2021-07-20 DIAGNOSIS — I10 Essential (primary) hypertension: Secondary | ICD-10-CM

## 2021-07-25 ENCOUNTER — Ambulatory Visit
Admission: RE | Admit: 2021-07-25 | Discharge: 2021-07-25 | Disposition: A | Discharge status: Home / Self Care | Payer: Medicare Other | Source: Ambulatory Visit | Attending: Internal Medicine | Admitting: Internal Medicine

## 2021-07-25 DIAGNOSIS — R1084 Generalized abdominal pain: Secondary | ICD-10-CM | POA: Insufficient documentation

## 2021-07-25 DIAGNOSIS — R140 Abdominal distension (gaseous): Secondary | ICD-10-CM | POA: Insufficient documentation

## 2021-07-25 MED ORDER — IOHEXOL 300 MG/ML  SOLN
100.0000 mL | Freq: Once | INTRAMUSCULAR | Status: AC | PRN
  Administered 2021-07-25: 100 mL via INTRAVENOUS

## 2021-07-31 ENCOUNTER — Other Ambulatory Visit (INDEPENDENT_AMBULATORY_CARE_PROVIDER_SITE_OTHER): Payer: Medicare Other

## 2021-07-31 DIAGNOSIS — M2550 Pain in unspecified joint: Secondary | ICD-10-CM | POA: Diagnosis not present

## 2021-07-31 DIAGNOSIS — N179 Acute kidney failure, unspecified: Secondary | ICD-10-CM | POA: Diagnosis not present

## 2021-07-31 DIAGNOSIS — M898X9 Other specified disorders of bone, unspecified site: Secondary | ICD-10-CM

## 2021-07-31 LAB — BASIC METABOLIC PANEL
BUN: 16 mg/dL (ref 6–23)
CO2: 25 mEq/L (ref 19–32)
Calcium: 9.7 mg/dL (ref 8.4–10.5)
Chloride: 105 mEq/L (ref 96–112)
Creatinine, Ser: 0.94 mg/dL (ref 0.40–1.50)
GFR: 80.78 mL/min (ref >60.00)
Glucose, Bld: 124 mg/dL — ABNORMAL HIGH (ref 70–99)
Potassium: 3.9 mEq/L (ref 3.5–5.1)
Sodium: 138 mEq/L (ref 135–145)

## 2021-08-01 LAB — MICROALBUMIN / CREATININE URINE RATIO
Creatinine, Urine: 127 mg/dL (ref 20–320)
Microalb Creat Ratio: 13 mcg/mg creat (ref <30)
Microalb, Ur: 1.7 mg/dL

## 2021-08-01 LAB — SODIUM, URINE, RANDOM: Sodium, Ur: 160 mmol/L (ref 28–272)

## 2021-08-04 ENCOUNTER — Encounter: Payer: Self-pay | Admitting: Internal Medicine

## 2021-08-04 DIAGNOSIS — I700 Atherosclerosis of aorta: Secondary | ICD-10-CM | POA: Insufficient documentation

## 2021-08-04 DIAGNOSIS — K7460 Unspecified cirrhosis of liver: Secondary | ICD-10-CM | POA: Insufficient documentation

## 2021-08-05 LAB — PROTEIN ELECTROPHORESIS, SERUM
Albumin ELP: 4.1 g/dL (ref 3.8–4.8)
Alpha 1: 0.4 g/dL — ABNORMAL HIGH (ref 0.2–0.3)
Alpha 2: 0.8 g/dL (ref 0.5–0.9)
Beta 2: 0.5 g/dL (ref 0.2–0.5)
Beta Globulin: 0.5 g/dL (ref 0.4–0.6)
Gamma Globulin: 1.5 g/dL (ref 0.8–1.7)
Total Protein: 7.9 g/dL (ref 6.1–8.1)

## 2021-08-05 LAB — PROTEIN ELECTROPHORESIS, URINE REFLEX
Albumin ELP, Urine: 48 %
Alpha-1-Globulin, U: 6.3 %
Alpha-2-Globulin, U: 10.1 %
Beta Globulin, U: 28.5 %
Gamma Globulin, U: 7.2 %
Protein, Ur: 9.9 mg/dL

## 2021-08-05 LAB — ANA: Anti Nuclear Antibody (ANA): NEGATIVE

## 2021-08-05 LAB — C-REACTIVE PROTEIN: CRP: 1.4 mg/L (ref <8.0)

## 2021-08-05 LAB — SEDIMENTATION RATE: Sed Rate: 11 mm/h (ref 0–20)

## 2021-08-15 ENCOUNTER — Other Ambulatory Visit: Payer: Self-pay | Admitting: Gastroenterology

## 2021-08-15 ENCOUNTER — Other Ambulatory Visit: Payer: Self-pay | Admitting: Internal Medicine

## 2021-08-15 DIAGNOSIS — E876 Hypokalemia: Secondary | ICD-10-CM

## 2021-08-19 ENCOUNTER — Other Ambulatory Visit: Payer: Self-pay | Admitting: Gastroenterology

## 2021-08-20 ENCOUNTER — Other Ambulatory Visit: Payer: Self-pay | Admitting: Gastroenterology

## 2021-08-23 ENCOUNTER — Other Ambulatory Visit: Payer: Self-pay | Admitting: Internal Medicine

## 2021-08-23 DIAGNOSIS — M5136 Other intervertebral disc degeneration, lumbar region: Secondary | ICD-10-CM

## 2021-08-23 DIAGNOSIS — G894 Chronic pain syndrome: Secondary | ICD-10-CM

## 2021-09-02 NOTE — Telephone Encounter (Signed)
? ?  Patient Name: Erik Nicholson  ?DOB: 1951-03-25 ?MRN: [id number] ? ?Primary Cardiologist: Nelva Bush, MD ? ?Chart reviewed as part of pre-operative protocol coverage.  Patient was previously cleared for this procedure. I have forwarded the note with clearance to [facility] neurology for that was provided on the original clearance request. ? ?Thank you. ? ? ?Lenna Sciara, NP ?09/02/2021, 3:26 PM ? ? ?

## 2021-09-02 NOTE — Telephone Encounter (Signed)
[facility] checking on status of clearance  ?

## 2021-09-03 ENCOUNTER — Encounter: Payer: Self-pay | Admitting: Internal Medicine

## 2021-09-03 NOTE — Progress Notes (Signed)
Follow-up Outpatient Visit Date: 09/04/2021  Primary Care Provider: [name], [name], MD [place] [address]  Chief Complaint: Shortness of breath  HPI:  [name] is a 71 y.o. male with history of coronary artery disease status post multiple PCI's, peripheral vascular disease status post bilateral lower extremity stenting followed by Dr. Scot Dock, ulcerative colitis, hypertension, hyperlipidemia, hepatitis C, prostate cancer, and BPH, who presents for follow-up of disease.  He was last seen in our office in November, at which time he was feeling fairly well from a heart standpoint.  Chronic exertional dyspnea was stable.  Today, [name] reports feeling fairly well though he still has shortness of breath when he walks or does light activity such as showering himself.  He was seen last year by Dr. Mortimer Fries in the pulmonary clinic and prescribed Spiriva and albuterol.  He never had the Spiriva filled.  He has tried taking albuterol before and after taking a shower but did not notice much improvement.  He also feels like he has put on weight and is holding onto fluid.  He sporadically takes as needed furosemide.  He denies chest pain, palpitations, and lightheadedness.  Due to nuisance bleeding, he is currently taking clopidogrel every other day.  He is scheduled for revision of his penile prosthesis at [facility] in August and states that he will need preoperative cardiac assessment.  He is currently wearing a wrist splint on his right wrist for management of tendinitis, arthritis, and bruise after a fall.  --------------------------------------------------------------------------------------------------  Cardiovascular History & Procedures: Cardiovascular Problems: Coronary artery disease Peripheral vascular disease   Risk Factors: Known CAD and PAD, hypertension, hyperlipidemia, male gender, and age greater than 11   Cath/PCI: LHC/PTCA (03/21/2020): LMCA with  minimal luminal irregularities.  LAD with 30% ostial/proximal disease and 60-70% ostial stenoses of D1 and D2.  Prox/mid LAD stent widely patent.  LCx with 40% proximal/mid stenosis.  RCA with long segment of overlapping stents (prox -> distal) with 70-80% ISR involving the mid-distal segment.  60% ostial rPDA and 70% ostial rPL1 also noted.  Successful FFR-guided angioplasty of mid/distal RCA using a 3.5 x 15 mm [place] balloon at up to 22 atm with reduction in stenosis from 75% -> 20%. RLE PTA (02/19/2014): Right mid and distal SFA laser atherectomy and PTA/stenting. Runoff (02/01/2014): Severe right SFA stenosis with 2-vessel runoff. Abdominal aortogran and runoff/PTA (04/26/2012): Right SFA stenting and left SFA PTA with DCB LHC (02/25/2012): LMCA, LAD, and LCx "without any obtructive lesions).  RCA with 40% mid vessel stenosis.  Proximal and mid RCA stents patent.  LVEF 60% Abdominal aortogram and runoff (02/25/2012): 95% bilateral SFA stenoses.   CV Surgery: None   EP Procedures and Devices: 14-day event monitor (02/19/2021): Predominantly sinus rhythm with rare PACs and PVCs.  Brief PSVT lasting up to 7 beats as well as a single 4 beat run of NSVT also noted.  Non-Invasive Evaluation(s): Pharmacologic MPI (11/25/2020): Low risk study without ischemia or scar.  LVEF 55-65%. TTE (05/14/2020): Normal LV size with mild LVH.  LVEF 55-60% with normal wall motion.  Grade 1 diastolic dysfunction.  Normal RV size and function.  No significant valvular abnormality. ABIs (05/17/2019): Normal ABIs and TBI's bilaterally. Myocardial perfusion stress test (03/07/2019): Normal study without ischemia or scar.  LVEF 56%. TTE (03/02/2019): Normal LV size and wall thickness.  LVEF 60-65% with grade 1 diastolic dysfunction.  Normal RV size and function.  No significant valvular abnormality. ABIs and bilateral lower  extremity Doppler (03/02/2019): ABIs normal (1.1 bilaterally).  Bilateral SFA stents are patent with mild  stenosis in the midsegment of the right SFA stent. Abdominal aorta duplex (07/22/2016): No AAA.  Maximal aortic diameter 2.8 cm. TTE (07/22/2016): Normal LV size with mild LVH and grade 1 diastolic dysfunction.  LVEF 65%.  Trace aortic and mitral regurgitation. Pharmacologic MPI (07/21/2016): Normal myocardial perfusion without ischemia or scar. Renal artery duplex (02/19/2012): Normal without evidence of renal artery stenosis.  Recent CV Pertinent Labs: Lab Results  Component Value Date   CHOL 143 07/15/2021   HDL 44.40 07/15/2021   LDLCALC 45 01/30/2020   LDLDIRECT 69.0 07/15/2021   TRIG 216.0 (H) 07/15/2021   CHOLHDL 3 07/15/2021   INR 1.1 (H) 12/22/2018   BNP 11.9 10/02/2020   K 3.9 07/31/2021   MG 1.9 08/06/2020   BUN 16 07/31/2021   BUN 14 03/10/2021   CREATININE 0.94 07/31/2021    Past medical and surgical history were reviewed and updated in EPIC.  Current Meds  Medication Sig   acetaminophen (TYLENOL) 500 MG tablet Take 500-1,000 mg by mouth every 6 (six) hours as needed (pain.).   albuterol (VENTOLIN HFA) 108 (90 Base) MCG/ACT inhaler Inhale 2 puffs into the lungs every 6 (six) hours as needed.   amLODipine (NORVASC) 5 MG tablet Take 1 tablet (5 mg total) by mouth daily. D/c 10 mg dose   atorvastatin (LIPITOR) 40 MG tablet Take 1 tablet (40 mg total) by mouth daily.   clopidogrel (PLAVIX) 75 MG tablet Take 1 tablet (75 mg total) by mouth daily.   cyclobenzaprine (FLEXERIL) 10 MG tablet Take 1 tablet (10 mg total) by mouth 2 (two) times daily as needed for muscle spasms.   furosemide (LASIX) 20 MG tablet Take 20 mg by mouth daily as needed for fluid.   hyoscyamine (LEVBID) 0.375 MG 12 hr tablet TAKE 1 TABLET BY MOUTH EVERY 12 HOURS AS NEEDED   lidocaine (LIDODERM) 5 % Place 1 patch onto the skin daily as needed (pain). Remove patch after 12 hours.   mesalamine (LIALDA) 1.2 g EC tablet Take 2 tablets (2.4 g total) by mouth daily with breakfast.   metoprolol succinate  (TOPROL-XL) 100 MG 24 hr tablet TAKE 1 TABLET BY MOUTH DAILY. TAKE WITH OR IMMEDIATELY FOLLOWING A MEAL.   nitroGLYCERIN (NITROSTAT) 0.4 MG SL tablet TAKE 1 TABLET BY MOUTH SUBLINGUALLY EVERY 5 MINS AS NEEDED FOR CHEST PAIN. IF >1 NEEDED, CALL 911   oxyCODONE (OXY IR/ROXICODONE) 5 MG immediate release tablet Take 1 tablet (5 mg total) by mouth every 6 (six) hours as needed for severe pain.   oxyCODONE ER (XTAMPZA ER) 9 MG C12A Take 9 mg by mouth every 12 (twelve) hours as needed.   pantoprazole (PROTONIX) 40 MG tablet TAKE 1 TABLET (40 MG TOTAL) BY MOUTH TWICE A DAY BEFORE MEALS   potassium chloride (MICRO-K) 10 MEQ CR capsule Take 2 capsules (20 mEq total) by mouth daily. D/c tablet   pregabalin (LYRICA) 50 MG capsule Take 50 mg by mouth at bedtime.   RESTASIS 0.05 % ophthalmic emulsion Place 1 drop into both eyes See admin instructions. Instill 1 drop into both eyes (scheduled) every morning & may instill in the evening if needed for eye irritation/dryness   telmisartan-hydrochlorothiazide (MICARDIS HCT) 80-12.5 MG tablet Take 1 tablet by mouth daily. In am d/c 40-12.5 mg dose    Allergies: Cymbalta [duloxetine hcl]  Social History   Tobacco Use   Smoking status: Former  Packs/day: 2.00    Years: 40.00    Pack years: 80.00    Types: Cigarettes    Quit date: 04/20/1998    Years since quitting: 23.3   Smokeless tobacco: Never   Tobacco comments:    quit in 2000s   Vaping Use   Vaping Use: Never used  Substance Use Topics   Alcohol use: Yes    Comment: occasional   Drug use: Not Currently    Family History  Problem Relation Age of Onset   Heart disease Mother    Stroke Mother    Breast cancer Mother    Diabetes Sister    Diabetes Brother    Colon cancer Neg Hx    Esophageal cancer Neg Hx    Inflammatory bowel disease Neg Hx    Liver disease Neg Hx    Pancreatic cancer Neg Hx    Rectal cancer Neg Hx    Stomach cancer Neg Hx     Review of Systems: A 12-system review of  systems was performed and was negative except as noted in the HPI.  --------------------------------------------------------------------------------------------------  Physical Exam: BP 110/70 (BP Location: Left Arm, Patient Position: Sitting, Cuff Size: Large)   Pulse 78   Ht 5' 7"  (1.702 m)   Wt 192 lb (87.1 kg)   SpO2 99%   BMI 30.07 kg/m   General:  NAD. Neck: No JVD or HJR. Lungs: Coarse breath sounds without wheezes or crackles. Heart: Regular rate and rhythm without murmurs, rubs, or gallops. Abdomen: Soft, nontender, nondistended. Extremities: No lower extremity edema.  Right wrist splint in place  EKG: Normal sinus rhythm with inferior infarct.  Compared with prior tracing from 02/19/2021, PR interval has shortened.  Otherwise, no significant interval change.  Lab Results  Component Value Date   WBC 5.7 04/30/2021   HGB 11.8 (L) 04/30/2021   HCT 35.6 (L) 04/30/2021   MCV 98.4 04/30/2021   PLT 224.0 04/30/2021    Lab Results  Component Value Date   NA 138 07/31/2021   K 3.9 07/31/2021   CL 105 07/31/2021   CO2 25 07/31/2021   BUN 16 07/31/2021   CREATININE 0.94 07/31/2021   GLUCOSE 124 (H) 07/31/2021   ALT 24 04/30/2021    Lab Results  Component Value Date   CHOL 143 07/15/2021   HDL 44.40 07/15/2021   LDLCALC 45 01/30/2020   LDLDIRECT 69.0 07/15/2021   TRIG 216.0 (H) 07/15/2021   CHOLHDL 3 07/15/2021    --------------------------------------------------------------------------------------------------  ASSESSMENT AND PLAN: Coronary artery disease with stable angina: [name] denies chest pain but has chronic exertional dyspnea.  This is likely multifactorial though it may be reflective of some coronary insufficiency.  His most recent ischemia evaluation with MPI in 11/2020 was normal without ischemia or scar.  LVEF was normal.  We have agreed to defer repeat ischemia evaluation in favor of medical therapy.  Continue amlodipine and metoprolol for  antianginal therapy.  I have encouraged [name] to take clopidogrel daily, as prescribed, rather than every other day.  If he needs suspension of clopidogrel for upcoming penile prosthesis surgery, he will need to be switched to aspirin 81 mg daily while in the perioperative period.  Chronic HFpEF: I suspect some of [name]'s shortness of breath is due to diastolic heart failure.  He appears fairly euvolemic on examination today though he feels like he is retaining some fluid.  His weight is also up 12 pounds since February and March.  I have encouraged him to minimize his sodium intake and to begin using furosemide 20 mg daily until our follow-up visit in 1 month.  We will plan to continue telmisartan-HCTZ for now, though I would have a low threshold for discontinuation of the HCTZ component if [name] will need to remain on standing furosemide in the future.  BMP should be checked at his follow-up visit.  Hypertension: Blood pressure well controlled today.  No medication changes at this time.  Hyperlipidemia: LDL reasonable at 69 on last check in 06/2021.  Continue atorvastatin 40 mg daily.  COPD: I suspect this is playing a role in Mr. Van Nicholson's shortness of breath.  I have encouraged him to try Spiriva that was previously recommended by Dr. Mortimer Fries.  I will send in a one-time prescription today, as it will need to be refilled by Dr. Stoney Bang or his PCP going forward.  Erectile dysfunction: Erik Nicholson reports that he is in need of a revision of his penile prosthesis the summer.  I would like for his breathing to improve before he undergoes elective surgery.  We will reassess his cardiovascular risk at his next follow-up appointment following aforementioned interventions.  Clopidogrel would need to be held for 5 days before the procedure.  While he is off clopidogrel, aspirin 81 mg daily will need to be taken given his numerous stents.  Follow-up: Return to clinic in 1 month with  APP.   Nelva Bush, MD 09/04/2021 10:33 AM

## 2021-09-04 ENCOUNTER — Ambulatory Visit (INDEPENDENT_AMBULATORY_CARE_PROVIDER_SITE_OTHER): Payer: Medicare Other | Admitting: Internal Medicine

## 2021-09-04 ENCOUNTER — Encounter: Payer: Self-pay | Admitting: Internal Medicine

## 2021-09-04 VITALS — BP 110/70 | HR 78 | Ht 67.0 in | Wt 192.0 lb

## 2021-09-04 DIAGNOSIS — I10 Essential (primary) hypertension: Secondary | ICD-10-CM

## 2021-09-04 DIAGNOSIS — I5032 Chronic diastolic (congestive) heart failure: Secondary | ICD-10-CM | POA: Diagnosis not present

## 2021-09-04 DIAGNOSIS — E785 Hyperlipidemia, unspecified: Secondary | ICD-10-CM | POA: Diagnosis not present

## 2021-09-04 DIAGNOSIS — J449 Chronic obstructive pulmonary disease, unspecified: Secondary | ICD-10-CM | POA: Insufficient documentation

## 2021-09-04 DIAGNOSIS — N529 Male erectile dysfunction, unspecified: Secondary | ICD-10-CM | POA: Insufficient documentation

## 2021-09-04 DIAGNOSIS — I25118 Atherosclerotic heart disease of native coronary artery with other forms of angina pectoris: Secondary | ICD-10-CM

## 2021-09-04 MED ORDER — SPIRIVA RESPIMAT 2.5 MCG/ACT IN AERS: 2.0000 | INHALATION_SPRAY | Freq: Every day | RESPIRATORY_TRACT | 0 refills | Status: DC

## 2021-09-04 MED ORDER — FUROSEMIDE 20 MG PO TABS: 20.0000 mg | ORAL_TABLET | Freq: Every day | ORAL | 0 refills | Status: DC

## 2021-09-04 NOTE — Patient Instructions (Addendum)
Medication Instructions:   Your physician has recommended you make the following change in your medication:   CHANGE Furosemide (Lasix) to 20 mg daily   TAKE Plavix (clopidogrel) daily   *If you need a refill on your cardiac medications before your next appointment, please call your pharmacy*   Lab Work:  None ordered  Testing/Procedures:  None ordered   Follow-Up: At [facility], you and your health needs are our priority.  As part of our continuing mission to provide you with exceptional heart care, we have created designated Provider Care Teams.  These Care Teams include your primary Cardiologist (physician) and Advanced Practice Providers (APPs -  Physician Assistants and Nurse Practitioners) who all work together to provide you with the care you need, when you need it.  We recommend signing up for the patient portal called "MyChart".  Sign up information is provided on this After Visit Summary.  MyChart is used to connect with patients for Virtual Visits (Telemedicine).  Patients are able to view lab/test results, encounter notes, upcoming appointments, etc.  Non-urgent messages can be sent to your provider as well.   To learn more about what you can do with MyChart, go to [email or web address].    Your next appointment:   1 month(s) w/ APP  The format for your next appointment:   In Person  Provider:   You may see one of the following Advanced Practice Providers on your designated Care Team:   Murray Hodgkins, NP Christell Faith, PA-C Cadence Kathlen Mody, PA-C{   Important Information About Sugar

## 2021-09-11 ENCOUNTER — Other Ambulatory Visit: Payer: Self-pay | Admitting: Internal Medicine

## 2021-09-11 DIAGNOSIS — I10 Essential (primary) hypertension: Secondary | ICD-10-CM

## 2021-09-15 ENCOUNTER — Other Ambulatory Visit: Payer: Self-pay | Admitting: Internal Medicine

## 2021-09-15 DIAGNOSIS — M5136 Other intervertebral disc degeneration, lumbar region: Secondary | ICD-10-CM

## 2021-09-15 DIAGNOSIS — M51369 Other intervertebral disc degeneration, lumbar region without mention of lumbar back pain or lower extremity pain: Secondary | ICD-10-CM

## 2021-09-15 DIAGNOSIS — G894 Chronic pain syndrome: Secondary | ICD-10-CM

## 2021-09-15 DIAGNOSIS — E876 Hypokalemia: Secondary | ICD-10-CM

## 2021-09-16 ENCOUNTER — Telehealth: Payer: Self-pay | Admitting: Internal Medicine

## 2021-09-16 ENCOUNTER — Other Ambulatory Visit (INDEPENDENT_AMBULATORY_CARE_PROVIDER_SITE_OTHER): Payer: Medicare Other

## 2021-09-16 ENCOUNTER — Encounter: Payer: Self-pay | Admitting: Gastroenterology

## 2021-09-16 ENCOUNTER — Other Ambulatory Visit: Payer: Self-pay | Admitting: Urology

## 2021-09-16 ENCOUNTER — Ambulatory Visit (INDEPENDENT_AMBULATORY_CARE_PROVIDER_SITE_OTHER): Payer: Medicare Other | Admitting: Gastroenterology

## 2021-09-16 VITALS — BP 110/68 | HR 82 | Ht 67.0 in | Wt 191.0 lb

## 2021-09-16 DIAGNOSIS — K269 Duodenal ulcer, unspecified as acute or chronic, without hemorrhage or perforation: Secondary | ICD-10-CM | POA: Diagnosis not present

## 2021-09-16 DIAGNOSIS — Z8619 Personal history of other infectious and parasitic diseases: Secondary | ICD-10-CM

## 2021-09-16 DIAGNOSIS — R932 Abnormal findings on diagnostic imaging of liver and biliary tract: Secondary | ICD-10-CM

## 2021-09-16 DIAGNOSIS — K5190 Ulcerative colitis, unspecified, without complications: Secondary | ICD-10-CM

## 2021-09-16 HISTORY — DX: Abnormal findings on diagnostic imaging of liver and biliary tract: R93.2

## 2021-09-16 LAB — COMPREHENSIVE METABOLIC PANEL
ALT: 28 U/L (ref 0–53)
AST: 24 U/L (ref 0–37)
Albumin: 4.6 g/dL (ref 3.5–5.2)
Alkaline Phosphatase: 74 U/L (ref 39–117)
BUN: 20 mg/dL (ref 6–23)
CO2: 25 mEq/L (ref 19–32)
Calcium: 9.8 mg/dL (ref 8.4–10.5)
Chloride: 105 mEq/L (ref 96–112)
Creatinine, Ser: 1.1 mg/dL (ref 0.40–1.50)
GFR: 66.84 mL/min (ref >60.00)
Glucose, Bld: 106 mg/dL — ABNORMAL HIGH (ref 70–99)
Potassium: 3.7 mEq/L (ref 3.5–5.1)
Sodium: 138 mEq/L (ref 135–145)
Total Bilirubin: 0.7 mg/dL (ref 0.2–1.2)
Total Protein: 8.1 g/dL (ref 6.0–8.3)

## 2021-09-16 LAB — PROTIME-INR
INR: 1 ratio (ref 0.8–1.0)
Prothrombin Time: 11.1 s (ref 9.6–13.1)

## 2021-09-16 LAB — CBC
HCT: 39.4 % (ref 39.0–52.0)
Hemoglobin: 13.1 g/dL (ref 13.0–17.0)
MCHC: 33.1 g/dL (ref 30.0–36.0)
MCV: 97 fl (ref 78.0–100.0)
Platelets: 214 10*3/uL (ref 150.0–400.0)
RBC: 4.06 Mil/uL — ABNORMAL LOW (ref 4.22–5.81)
RDW: 13.1 % (ref 11.5–15.5)
WBC: 5.9 10*3/uL (ref 4.0–10.5)

## 2021-09-16 NOTE — Telephone Encounter (Signed)
   Pre-operative Risk Assessment    Patient Name: Erik Nicholson  DOB: [birth year] MRN: [id number]      Request for Surgical Clearance    Procedure:   A removal and replcement of a Penial Prothesis  Date of Surgery:  11-11-21  Clearance                                  Surgeon:  Dr Terrilee Files Surgeon's Group or Practice Name:   Phone number:  [phone] Fax number:  [phone]   Type of Clearance Requested:   - Medical  - Pharmacy:  Hold Clopidogrel (Plavix)     Type of Anesthesia:  General    Additional requests/questions:    [name]   09/16/2021, 11:46 AM

## 2021-09-16 NOTE — Telephone Encounter (Signed)
See notes pt has appt With Christell Faith, [facility] 10/10/21, will assess for pre op clearance at the time

## 2021-09-16 NOTE — Progress Notes (Unsigned)
[place] VISIT   Primary Care Provider [name], [name], MD [place] [address] [phone]  Patient Profile: Erik Nicholson is a 71 y.o. male with a pmh significant for reported pan ulcerative colitis (dx >16 years ago), CAD (on Plavix), PAD status post stenting, hypertension, hyperlipidemia, chronic back pain, BPH, prostate cancer status post brachytherapy, prior hepatitis C (status post treatment), possible pancreatic gastric rest, penile implant, osteoarthritis.  The patient presents to the [facility] for an evaluation and management of problem(s) noted below:  Problem List No diagnosis found.   History of Present Illness Please see prior notes for full details of HPI.  Interval History Since seeing the patient earlier this year, he has done extremely well.  The patient actually ran out of hyoscyamine and has been doing okay without it.  He is having between 1 and 2 bowel movements per day.  No significant "flares".  He has not been on any steroids.  Rectal urgency occurs infrequently at this time.  He is not having any nocturnal wake ups to have bowel movements.  No new progressive abdominal pain.  He has gained weight over the course of the last few months but will be working on trying to get this down.  GI Review of Systems Positive as above Negative for dysphagia, odynophagia, pyrosis, alteration of bowel habits, melena, hematochezia  Review of Systems General: Denies fevers/chills/weight loss unintentionally HEENT: Denies oral lesions Cardiovascular: Denies chest pain Pulmonary: Denies shortness of breath Gastroenterological: See HPI Genitourinary: Denies darkened urine Hematological: Positive for easy bruising/bleeding due to Plavix use Dermatological: Denies jaundice Psychological: Mood is stable   Medications Current Outpatient Medications  Medication Sig Dispense Refill   acetaminophen  (TYLENOL) 500 MG tablet Take 500-1,000 mg by mouth every 6 (six) hours as needed (pain.).     albuterol (VENTOLIN HFA) 108 (90 Base) MCG/ACT inhaler Inhale 2 puffs into the lungs every 6 (six) hours as needed.     amLODipine (NORVASC) 5 MG tablet Take 1 tablet (5 mg total) by mouth daily. D/c 10 mg dose 90 tablet 3   atorvastatin (LIPITOR) 40 MG tablet Take 1 tablet (40 mg total) by mouth daily. 90 tablet 2   clopidogrel (PLAVIX) 75 MG tablet Take 1 tablet (75 mg total) by mouth daily. 90 tablet 3   cyclobenzaprine (FLEXERIL) 10 MG tablet Take 1 tablet (10 mg total) by mouth 2 (two) times daily as needed for muscle spasms. 180 tablet 2   furosemide (LASIX) 20 MG tablet Take 1 tablet (20 mg total) by mouth daily. 30 tablet 0   hyoscyamine (LEVBID) 0.375 MG 12 hr tablet TAKE 1 TABLET BY MOUTH EVERY 12 HOURS AS NEEDED 15 tablet 0   lidocaine (LIDODERM) 5 % Place 1 patch onto the skin daily as needed (pain). Remove patch after 12 hours.     mesalamine (LIALDA) 1.2 g EC tablet Take 2 tablets (2.4 g total) by mouth daily with breakfast. 180 tablet 3   metoprolol succinate (TOPROL-XL) 100 MG 24 hr tablet TAKE 1 TABLET BY MOUTH EVERY DAY WITH OR IMMEDIATELY FOLLOWING A MEAL 90 tablet 0   nitroGLYCERIN (NITROSTAT) 0.4 MG SL tablet TAKE 1 TABLET BY MOUTH SUBLINGUALLY EVERY 5 MINS AS NEEDED FOR CHEST PAIN. IF >1 NEEDED, CALL 911 75 tablet 4   oxyCODONE (OXY IR/ROXICODONE) 5 MG immediate release tablet Take 1 tablet (5 mg total) by mouth every 6 (six) hours as needed for severe pain. 12 tablet 0  oxyCODONE ER (XTAMPZA ER) 9 MG C12A Take 9 mg by mouth every 12 (twelve) hours as needed.     pantoprazole (PROTONIX) 40 MG tablet TAKE 1 TABLET (40 MG TOTAL) BY MOUTH TWICE A DAY BEFORE MEALS 60 tablet 6   potassium chloride (MICRO-K) 10 MEQ CR capsule Take 2 capsules (20 mEq total) by mouth daily. D/c tablet 180 capsule 3   pregabalin (LYRICA) 50 MG capsule Take 50 mg by mouth at bedtime.     RESTASIS 0.05 %  ophthalmic emulsion Place 1 drop into both eyes See admin instructions. Instill 1 drop into both eyes (scheduled) every morning & may instill in the evening if needed for eye irritation/dryness     telmisartan-hydrochlorothiazide (MICARDIS HCT) 80-12.5 MG tablet TAKE 1 TABLET BY MOUTH DAILY. IN AM D/C 40-12.5 MG DOSE 90 tablet 3   Tiotropium Bromide Monohydrate (SPIRIVA RESPIMAT) 2.5 MCG/ACT AERS Inhale 2 puffs into the lungs daily. 1 each 0   No current facility-administered medications for this visit.    Allergies Allergies  Allergen Reactions   Cymbalta [Duloxetine Hcl] Other (See Comments)    drowsiness    Histories Past Medical History:  Diagnosis Date   Anemia    Arthritis    BPH (benign prostatic hyperplasia)    CAD (coronary artery disease)    x 7 cardiac stents   Chronic back pain    L4/5 with chronic right leg pain    COVID-19    10/20/20   Floater, vitreous, left    Hx of hepatitis C    treated with Harvoni in 2014   Hyperlipidemia    Hypertension    Neuromuscular disorder ([place])    nerve damage in back   Neuropathy    Prostate cancer ([place])    PVD (peripheral vascular disease) ([place])    2 stents right leg and 1 in left    Ulcerative colitis ([place])    with diarrhea   Past Surgical History:  Procedure Laterality Date   back surgery     x 2 lumbar last 09/2016 in [place]  06/02/2021   Procedure: BIOPSY;  Surgeon: Irving Copas., MD;  Location: [facility];  Service: Gastroenterology;;  EGD and COLON   CARDIAC CATHETERIZATION     CATARACT EXTRACTION, BILATERAL     COLONOSCOPY     last 09/2018 in [place] N/A 06/02/2021   Procedure: COLONOSCOPY WITH PROPOFOL;  Surgeon: Irving Copas., MD;  Location: [facility];  Service: Gastroenterology;  Laterality: N/A;   CORONARY BALLOON ANGIOPLASTY N/A 03/21/2020   Procedure: CORONARY BALLOON ANGIOPLASTY;  Surgeon: Nelva Bush, MD;  Location: [facility];  Service: Cardiovascular;  Laterality: N/A;   ESOPHAGOGASTRODUODENOSCOPY (EGD) WITH PROPOFOL N/A 06/02/2021   Procedure: ESOPHAGOGASTRODUODENOSCOPY (EGD) WITH PROPOFOL;  Surgeon: Rush Landmark Telford Nab., MD;  Location: [facility];  Service: Gastroenterology;  Laterality: N/A;   HERNIA REPAIR     right    INTRAVASCULAR PRESSURE WIRE/FFR STUDY N/A 03/21/2020   Procedure: INTRAVASCULAR PRESSURE WIRE/FFR STUDY;  Surgeon: Nelva Bush, MD;  Location: [facility];  Service: Cardiovascular;  Laterality: N/A;   LEFT HEART CATH AND CORONARY ANGIOGRAPHY N/A 03/21/2020   Procedure: LEFT HEART CATH AND CORONARY ANGIOGRAPHY;  Surgeon: Nelva Bush, MD;  Location: [facility];  Service: Cardiovascular;  Laterality: N/A;   PENILE PROSTHESIS IMPLANT     PERCUTANEOUS CORONARY STENT INTERVENTION (PCI-S)     x 7-8 heart  PERIPHERAL ARTERIAL STENT GRAFT     PROSTATE BIOPSY     pvd     with stenting x 2 right leg below knee and 1 left thigh    SHOULDER ARTHROSCOPY Bilateral    UPPER ESOPHAGEAL ENDOSCOPIC ULTRASOUND (EUS) N/A 06/02/2021   Procedure: UPPER ESOPHAGEAL ENDOSCOPIC ULTRASOUND (EUS);  Surgeon: Irving Copas., MD;  Location: [facility];  Service: Gastroenterology;  Laterality: N/A;   VITRECTOMY     right eye   Social History   Socioeconomic History   Marital status: Married    Spouse name: Not on file   Number of children: 2   Years of education: Not on file   Highest education level: Not on file  Occupational History   Not on file  Tobacco Use   Smoking status: Former    Packs/day: 2.00    Years: 40.00    Pack years: 80.00    Types: Cigarettes    Quit date: 04/20/1998    Years since quitting: 23.4   Smokeless tobacco: Never   Tobacco comments:    quit in 2000s   Vaping Use   Vaping Use: Never used  Substance and Sexual Activity   Alcohol use: Yes    Comment: occasional   Drug use: Not Currently   Sexual activity: Yes  Other Topics  Concern   Not on file  Social History Narrative   Moved from [place] in 10/2018   2 sons in 42s as of 11/24/2018    Married wife is DPR Pamala Hurry x 45 years as of 04/2019   Social Determinants of Radio broadcast assistant Strain: Not on file  Food Insecurity: Not on file  Transportation Needs: Not on file  Physical Activity: Not on file  Stress: Not on file  Social Connections: Not on file  Intimate Partner Violence: Not on file   Family History  Problem Relation Age of Onset   Heart disease Mother    Stroke Mother    Breast cancer Mother    Diabetes Sister    Diabetes Brother    Colon cancer Neg Hx    Esophageal cancer Neg Hx    Inflammatory bowel disease Neg Hx    Liver disease Neg Hx    Pancreatic cancer Neg Hx    Rectal cancer Neg Hx    Stomach cancer Neg Hx    I have reviewed his medical, social, and family history in detail and updated the electronic medical record as necessary.    PHYSICAL EXAMINATION  BP 110/68   Pulse 82   Ht 5' 7"  (1.702 m)   Wt 191 lb (86.6 kg)   BMI 29.91 kg/m  Wt Readings from Last 3 Encounters:  09/16/21 191 lb (86.6 kg)  09/04/21 192 lb (87.1 kg)  07/15/21 196 lb (88.9 kg)  GEN: NAD, appears stated age, doesn't appear chronically ill PSYCH: Cooperative, without pressured speech EYE: Conjunctivae pink, sclerae anicteric ENT: MMM, no oral lesions noted CV: Nontachycardic RESP: No audible wheezing GI: NABS, soft, NT/ND, without rebound or guarding MSK/EXT: No lower extremity edema SKIN: No jaundice NEURO:  Alert & Oriented x 3, no focal deficits   REVIEW OF DATA  I reviewed the following data at the time of this encounter:  GI Procedures and Studies  February 2023 EGD/EUS EGD Impression: - No gross lesions in esophagus. - Z-line regular, 40 cm from the incisors. - Gastritis. Biopsied. - Multiple duodenal ulcers with a clean ulcer base (Forrest Class III). - Duodenitis.  Biopsied. EUS Impression: - The gastric SEL  is consistent with extrinsic compression was noted in the antrum of the stomach due to a normal-appearing gallbladder. - No malignant-appearing lymph nodes were visualized in the celiac region (level 20), perigastric region and peripancreatic region.  February 2023 colonoscopy - Hemorrhoids found on digital rectal exam. - The examined portion of the ileum was normal. - Normal mucosa in the entire examined colon. Biopsied for surveillance. - Non-bleeding non-thrombosed external and internal hemorrhoids.  Pathology FINAL MICROSCOPIC DIAGNOSIS:  A. DUODENUM, ULCERS, BIOPSY:  - Duodenitis with focal erosion consistent with peptic injury.  B. STOMACH, BIOPSY:  - Antral and oxyntic mucosa with hyperemia.  - No Helicobacter pylori identified.  C. COLON, RIGHT, BIOPSY:  - Unremarkable colonic mucosa.  - No active inflammation or chronic changes.  - Negative for dysplasia.  D. COLON, LEFT, BIOPSY:  - Unremarkable colonic mucosa.  - No active inflammation or chronic changes.  - Negative for dysplasia.  E. COLON, RECTUM, BIOPSY:  - Unremarkable colonic mucosa.  - No active inflammation or chronic changes.  - Negative for dysplasia.   Laboratory Studies  Reviewed those in epic  Imaging Studies  April 2023 CT abdomen pelvis with contrast IMPRESSION: 1. No acute abdominal/pelvic findings, mass lesions or lymphadenopathy. 2. No findings suspicious for active ulcerative colitis. 3. Suspect cirrhotic changes involving the liver. 4. Advanced atherosclerotic calcifications involving the aorta and branch vessels. 5. Brachytherapy seeds in the prostate gland but no findings to suggest metastatic prostate cancer.   ASSESSMENT  Mr. Marcio Hoque is a 71 y.o. male with a pmh significant for reported pan ulcerative colitis (dx >16 years ago), CAD (on Plavix), PAD status post stenting, hypertension, hyperlipidemia, chronic back pain, BPH, prostate cancer status post brachytherapy, prior hepatitis C  (status post treatment), possible pancreatic gastric rest, penile implant, osteoarthritis.  The patient is seen today for evaluation and management of:  No diagnosis found.  The patient is hemodynamically and clinically stable.  He is doing well on his Lialda therapy.  The patient will be due for colonoscopy for surveillance in 2023.  We will plan that to be done in conjunction with a endoscopic ultrasound to evaluate the gastric subepithelial lesion.  There is previous thought that this was a potential pancreatic rest.  We will ensure that there is no evidence of a true subepithelial lesion and then decide on follow-up that may be required from there.  We will need to get approval for Plavix hold x5 days prior to procedures.  The risks of an EUS including intestinal perforation, bleeding, infection, aspiration, and medication effects were discussed as was the possibility it may not give a definitive diagnosis if a biopsy is performed.  When a biopsy of the pancreas is done as part of the EUS, there is an additional risk of pancreatitis at the rate of about 1-2%.  It was explained that procedure related pancreatitis is typically mild, although it can be severe and even life threatening, which is why we do not perform random pancreatic biopsies and only biopsy a lesion/area we feel is concerning enough to warrant the risk.  The risks and benefits of endoscopic evaluation were discussed with the patient; these include but are not limited to the risk of perforation, infection, bleeding, missed lesions, lack of diagnosis, severe illness requiring hospitalization, as well as anesthesia and sedation related illnesses.  The patient and/or family is agreeable to proceed.  We will give the patient a prescription of Levbid that he  may use on an as-needed basis but hopefully he will not need to restart it significantly.  All patient questions were answered to the best of my ability, and the patient agrees to the  aforementioned plan of action with follow-up as indicated.     PLAN  Obtain fecal calprotectin when able Proceed with scheduling endoscopic ultrasound to further evaluate subepithelial lesion Proceed with scheduling colonoscopy for surveillance IBD -We will obtain approval for Plavix hold 5 days prior to procedure Continue Lialda 2.4 g daily Refilled hyoscyamine to be used as needed   No orders of the defined types were placed in this encounter.    New Prescriptions   No medications on file   Modified Medications   No medications on file    Planned Follow Up No follow-ups on file.  Total Time in Face-to-Face and in Coordination of Care for patient including independent/personal interpretation/review of prior testing, medical history, examination, medication adjustment, communicating results with the patient directly, and documentation with the EHR is 25 minutes.   Justice Britain, MD [facility] Office # [phone]

## 2021-09-16 NOTE — Patient Instructions (Signed)
You will be contacted by [facility] in the next 2 days to arrange a Ultrasound with Elastopathy. The number on your caller ID will be [phone], please answer when they call.  If you have not heard from them in 2 days please call [phone] to schedule.    Your provider has requested that you go to the basement level for lab work before leaving today. Press "B" on the elevator. The lab is located at the first door on the left as you exit the elevator.  You will need EGD in September 2023. Office to contact you at a later time to schedule.   If you are age 84 or older, your body mass index should be between 23-30. Your Body mass index is 29.91 kg/m. If this is out of the aforementioned range listed, please consider follow up with your Primary Care Provider.  If you are age 76 or younger, your body mass index should be between 19-25. Your Body mass index is 29.91 kg/m. If this is out of the aformentioned range listed, please consider follow up with your Primary Care Provider.   ________________________________________________________  The  GI providers would like to encourage you to use [facility] to communicate with providers for non-urgent requests or questions.  Due to long hold times on the telephone, sending your provider a message by [facility] may be a faster and more efficient way to get a response.  Please allow 48 business hours for a response.  Please remember that this is for non-urgent requests.  _______________________________________________________  Thank you for choosing me and [facility].  Dr. Rush Landmark

## 2021-09-17 DIAGNOSIS — Z8619 Personal history of other infectious and parasitic diseases: Secondary | ICD-10-CM | POA: Insufficient documentation

## 2021-09-17 LAB — HEPATITIS B SURFACE ANTIGEN: Hepatitis B Surface Ag: NONREACTIVE

## 2021-09-17 LAB — HEPATITIS A ANTIBODY, TOTAL: Hepatitis A AB,Total: REACTIVE — AB

## 2021-09-17 LAB — HEPATITIS B SURFACE ANTIBODY,QUALITATIVE: Hep B S Ab: NONREACTIVE

## 2021-09-17 LAB — HEPATITIS B CORE ANTIBODY, TOTAL: Hep B Core Total Ab: NONREACTIVE

## 2021-09-18 ENCOUNTER — Other Ambulatory Visit: Payer: Self-pay

## 2021-09-19 LAB — HEPATITIS C ANTIBODY
Hepatitis C Ab: REACTIVE — AB
SIGNAL TO CUT-OFF: >11 — ABNORMAL HIGH (ref <1.00)

## 2021-09-19 LAB — HCV RNA,QUANTITATIVE REAL TIME PCR
HCV Quantitative Log: <1.18 Log IU/mL
HCV RNA, PCR, QN: <15 IU/mL

## 2021-09-22 ENCOUNTER — Ambulatory Visit (INDEPENDENT_AMBULATORY_CARE_PROVIDER_SITE_OTHER): Payer: Medicare Other | Admitting: Gastroenterology

## 2021-09-22 DIAGNOSIS — Z23 Encounter for immunization: Secondary | ICD-10-CM | POA: Diagnosis not present

## 2021-10-02 ENCOUNTER — Ambulatory Visit (HOSPITAL_COMMUNITY)
Admission: RE | Admit: 2021-10-02 | Discharge: 2021-10-02 | Disposition: A | Discharge status: Home / Self Care | Payer: Medicare Other | Source: Ambulatory Visit | Attending: Gastroenterology | Admitting: Gastroenterology

## 2021-10-02 DIAGNOSIS — K269 Duodenal ulcer, unspecified as acute or chronic, without hemorrhage or perforation: Secondary | ICD-10-CM

## 2021-10-02 DIAGNOSIS — R932 Abnormal findings on diagnostic imaging of liver and biliary tract: Secondary | ICD-10-CM

## 2021-10-02 DIAGNOSIS — K5190 Ulcerative colitis, unspecified, without complications: Secondary | ICD-10-CM

## 2021-10-08 NOTE — Progress Notes (Deleted)
Cardiology Office Note    Date:  10/08/2021   ID:  Erik Nicholson, Erik Nicholson [birth year], MRN [id number]  PCP:  [name], [name], MD  Cardiologist:  Nelva Bush, MD  Electrophysiologist:  None   Chief Complaint: Follow-up  History of Present Illness:   Erik Nicholson is a 71 y.o. male with history of CAD status post multiple PCI's, HFpEF, PVD status post bilateral lower extremity stenting followed by vascular surgery, ulcerative colitis, COVID infection in 10/2020, hepatitis C, probable COPD, HTN, HLD, prostate cancer, and BPH who presents for ***.   With regards to his CAD and PVD, notes indicate he has a history of 7 coronary and 3 lower extremity stents while living in the [place] area.  Most recent [place] in 03/2020 demonstrated severe single-vessel CAD with up to 75% ISR of the mid/distal RCA stent as well as 60 to 70% ostial stenosis of RPDA and RPL 1.  There was mild to moderate noncritical left coronary disease with most severe lesion located in a small to moderate caliber D1 branch that was jailed by prior LAD stent with up to 70% stenosis.  Normal LV systolic function with mildly elevated filling pressures.  He underwent PTCA of the mid/distal RCA with reduction in stenosis from 75% to 20% with TIMI-3 flow.  Stent placement was not attempted due to multiple layers of stent already present and incomplete expansion of a 3.5 mm noncompliant balloon at 22 ATM.  With percutaneous intervention he did note improvement in chest pain, though did continue to have significant exertional dyspnea.  PFTs showed probable small obstructive airway disease with bronchodilator response, for which he is followed by pulmonology.  Most recent echo from 05/14/2020 showed an EF of 55 to 60%, no regional wall motion abnormalities, mild LVH, grade 1 diastolic dysfunction, normal RV systolic function and ventricular cavity size, trivial mitral regurgitation, and an estimated right atrial pressure of 3 mmHg.    PFTs in 05/2020 showed evidence of obstructive airway disease.   He was seen in the office in 07/2020 and continued to feel about the same as he had on his prior visit with mild chest tightness rated a 1 out of 10 with briskly walking.  His chronic exertional dyspnea was stable.  Chronic leg pain and mild lower extremity edema were stable.  BP was elevated in the 621H systolic.  He noted his telmisartan/HCTZ had recently been doubled, though despite this his BP remained elevated.  In this setting, he was started on amlodipine 5 mg.   He was seen in the ED on 10/02/2020 with intermittent acute on chronic dizziness that was worse with rotational movements of the head and positional changes.  High-sensitivity troponin negative x2.  BNP 11.  CT head showed no acute intracranial pathology.  Symptoms did not improve with meclizine.  He was advised to hold amlodipine.   He tested positive for COVID infection on 10/20/2020.   He was seen in the ED on 10/31/2020 with intermittent chest discomfort, shortness of breath, and a mild dry cough.  High-sensitivity troponin negative x2.  D-dimer normal.  EKG showed sinus rhythm with PACs and possible prior inferior infarct.  Chest x-ray showed no evidence of acute cardiopulmonary disease.  Symptoms were felt to be in the setting of his recent COVID diagnosis versus GI in etiology with noted symptom improvement following IV Pepcid and Maalox.  He was treated with a Z-Pak, prednisone, and an inhaler.   He was seen in the office  on 11/18/2020 and had not had any recent chest discomfort.  He continued to note chronic exertional dyspnea with activity such as climbing stairs or walking at a brisk pace.  Dyspnea improved with previously prescribed prednisone.  He was euvolemic.  Given symptoms, he underwent Lexiscan MPI on 11/25/2020 which showed no significant ischemia and was a low risk study with an EF of 55 to 65%.   He was evaluated by his PCP in 01/2021 with bilateral wrist pain  stemming from a [place] injury with noted to 10 out of 10 pain and swelling of the bilateral wrists.  Imaging showed concern for age-indeterminate injury versus possible ligamentous injury.  He was referred to orthopedics.  There was concern for possible palpitations.  Given this, when he was seen in 02/2021, at which time he was without symptoms of angina or decompensation with chronic stable dyspnea he underwent Zio patch which demonstrated predominantly sinus rhythm with rare PACs and PVCs with a few brief episodes of PSVT (6 runs lasting up to 7 beats) and NSVT (1 run lasting 4 beats).  He was last seen in the office on 09/04/2021 and continued to note shortness of breath with ambulation or with doing light activities such as showering.  It was noted pulmonology had recommended he start Spiriva and albuterol, though the former was not filled.  He did not note much improvement with his dyspnea when using albuterol before or after taking a shower.  He also felt like he had put on weight and was holding onto fluid.  He was sporadically taking as needed furosemide.  Due to nuisance bleeding, he was taking clopidogrel every other day.  He was encouraged to take clopidogrel every day.  With regards to his volume status, he was advised to minimize sodium intake and take furosemide 20 mg daily.  Dyspnea was felt to be multifactorial including HFpEF and COPD with a possible component of coronary insufficiency as well.  ***   Labs independently reviewed: 08/2021 - Hgb 13.1, PLT 214, potassium 3.7, BUN 20, serum creatinine 1.1, albumin 4.6, AST/ALT normal 06/2021 - direct LDL 69, TC 143, TG 216, HDL 44, A1c 5.8 04/2021 - TSH normal  Past Medical History:  Diagnosis Date   Anemia    Arthritis    BPH (benign prostatic hyperplasia)    CAD (coronary artery disease)    x 7 cardiac stents   Chronic back pain    L4/5 with chronic right leg pain    COVID-19    10/20/20   [place], vitreous, left    Hx of hepatitis C     treated with Harvoni in 2014   Hyperlipidemia    Hypertension    Neuromuscular disorder ([place])    nerve damage in back   Neuropathy    Prostate cancer ([place])    PVD (peripheral vascular disease) ([place])    2 stents right leg and 1 in left    Ulcerative colitis ([place])    with diarrhea    Past Surgical History:  Procedure Laterality Date   back surgery     x 2 lumbar last 09/2016 in [place]  06/02/2021   Procedure: BIOPSY;  Surgeon: Irving Copas., MD;  Location: [facility];  Service: Gastroenterology;;  EGD and COLON   CARDIAC CATHETERIZATION     CATARACT EXTRACTION, BILATERAL     COLONOSCOPY     last 09/2018 in [place] N/A 06/02/2021   Procedure: COLONOSCOPY WITH  PROPOFOL;  Surgeon: Irving Copas., MD;  Location: [facility];  Service: Gastroenterology;  Laterality: N/A;   CORONARY BALLOON ANGIOPLASTY N/A 03/21/2020   Procedure: CORONARY BALLOON ANGIOPLASTY;  Surgeon: Nelva Bush, MD;  Location: [facility];  Service: Cardiovascular;  Laterality: N/A;   ESOPHAGOGASTRODUODENOSCOPY (EGD) WITH PROPOFOL N/A 06/02/2021   Procedure: ESOPHAGOGASTRODUODENOSCOPY (EGD) WITH PROPOFOL;  Surgeon: Rush Landmark Telford Nab., MD;  Location: [facility];  Service: Gastroenterology;  Laterality: N/A;   HERNIA REPAIR     right    INTRAVASCULAR PRESSURE WIRE/FFR STUDY N/A 03/21/2020   Procedure: INTRAVASCULAR PRESSURE WIRE/FFR STUDY;  Surgeon: Nelva Bush, MD;  Location: [facility];  Service: Cardiovascular;  Laterality: N/A;   LEFT HEART CATH AND CORONARY ANGIOGRAPHY N/A 03/21/2020   Procedure: LEFT HEART CATH AND CORONARY ANGIOGRAPHY;  Surgeon: Nelva Bush, MD;  Location: [facility];  Service: Cardiovascular;  Laterality: N/A;   PENILE PROSTHESIS IMPLANT     PERCUTANEOUS CORONARY STENT INTERVENTION (PCI-S)     x 7-8 heart   PERIPHERAL ARTERIAL STENT GRAFT     PROSTATE BIOPSY     pvd     with  stenting x 2 right leg below knee and 1 left thigh    SHOULDER ARTHROSCOPY Bilateral    UPPER ESOPHAGEAL ENDOSCOPIC ULTRASOUND (EUS) N/A 06/02/2021   Procedure: UPPER ESOPHAGEAL ENDOSCOPIC ULTRASOUND (EUS);  Surgeon: Irving Copas., MD;  Location: [facility];  Service: Gastroenterology;  Laterality: N/A;   VITRECTOMY     right eye    Current Medications: No outpatient medications have been marked as taking for the 10/10/21 encounter (Appointment) with Rise Mu, PA-C.    Allergies:   Cymbalta [duloxetine hcl]   Social History   Socioeconomic History   Marital status: Married    Spouse name: Not on file   Number of children: 2   Years of education: Not on file   Highest education level: Not on file  Occupational History   Not on file  Tobacco Use   Smoking status: Former    Packs/day: 2.00    Years: 40.00    Total pack years: 80.00    Types: Cigarettes    Quit date: 04/20/1998    Years since quitting: 23.4   Smokeless tobacco: Never   Tobacco comments:    quit in 2000s   Vaping Use   Vaping Use: Never used  Substance and Sexual Activity   Alcohol use: Yes    Comment: occasional   Drug use: Not Currently   Sexual activity: Yes  Other Topics Concern   Not on file  Social History Narrative   Moved from [place] in 10/2018   2 sons in 73s as of 11/24/2018    Married wife is DPR Pamala Hurry x 45 years as of 04/2019   Social Determinants of Health   Financial Resource Strain: Low Risk  (07/09/2020)   Overall Financial Resource Strain (CARDIA)    Difficulty of Paying Living Expenses: Not hard at all  Food Insecurity: No Food Insecurity (07/09/2020)   Hunger Vital Sign    Worried About Running Out of Food in the Last Year: Never true    [place] in the Last Year: Never true  Transportation Needs: No Transportation Needs (07/09/2020)   PRAPARE - Hydrologist (Medical): No    Lack of Transportation (Non-Medical): No   Physical Activity: Not on file  Stress: No Stress Concern Present (07/09/2020)   [place]  [facility] [facility]    Feeling of Stress : Not at all  Social Connections: Unknown (07/09/2020)   Social Connection and Isolation Panel [NHANES]    Frequency of Communication with Friends and Family: Not on file    Frequency of Social Gatherings with Friends and Family: Not on file    Attends Religious Services: Not on file    Active Member of Clubs or Organizations: Not on file    Attends Archivist Meetings: Not on file    Marital Status: Married     Family History:  The patient's family history includes Breast cancer in his mother; Diabetes in his brother and sister; Heart disease in his mother; Stroke in his mother. There is no history of Colon cancer, Esophageal cancer, Inflammatory bowel disease, Liver disease, Pancreatic cancer, Rectal cancer, or Stomach cancer.  ROS:   ROS   EKGs/Labs/Other Studies Reviewed:    Studies reviewed were summarized above. The additional studies were reviewed today:  Zio patch 02/2021: The patient was monitored for 12 days, 23 hours. Predominant rhythm was sinus with an average rate of 77 bpm (range 52-118 bpm in sinus). There were rare PACs and PVCs. A single 4 beat run of nonsustained ventricular tachycardia occurred, with a maximum rate of 193 bpm. There were six atrial runs lasting up to 7 beats with a maximum rate of 139 bpm. No sustained arrhythmia or prolonged pause was observed. Patient triggered events correspond to sinus rhythm and PACs.   Predominantly sinus rhythm with rare PACs and PVCs.  A few brief episodes of PSVT and NSVT occurred (as detailed above). __________  Carlton Adam MPI 11/2020: ST segment elevation was noted during stress. No T wave inversion was noted during stress. The study is normal. This is a low risk study. The left ventricular ejection fraction is normal  (55-65%). CT attenuation images showed significant coronary calcifications and mild aortic calcifications. __________   2D echo 05/14/2020: 1. Left ventricular ejection fraction, by estimation, is 55 to 60%. The  left ventricle has normal function. The left ventricle has no regional  wall motion abnormalities. There is mild left ventricular hypertrophy.  Left ventricular diastolic parameters  are consistent with Grade I diastolic dysfunction (impaired relaxation).  The average left ventricular global longitudinal strain is -16.3 %.   2. Right ventricular systolic function is normal. The right ventricular  size is normal.   3. The mitral valve is grossly normal. Trivial mitral valve  regurgitation. No evidence of mitral stenosis.   4. The aortic valve was not well visualized. Aortic valve regurgitation  is not visualized. No aortic stenosis is present.   5. The inferior vena cava is normal in size with greater than 50%  respiratory variability, suggesting right atrial pressure of 3 mmHg. __________   [facility] 03/21/2020: Conclusions: Severe single-vessel coronary artery disease with up to 75% in-stent restenosis of the mid/distal RCA (FFR 0.70), as well as 60-70% ostial stenoses of rPDA and rPL1. Mild to moderate, non-critical left coronary artery disease.  The most severe lesion is a small to moderate caliber D1 branch that is jailed by LAD stent with up to 70% stenosis. Normal left ventricular systolic function with mildly elevated filling pressure. Successful PTCA of mid/distal RCA with reduction in stenosis from 75% to 20% with TIMI-3 flow.  Stent placement not attempted due to multiple layers of stent already present and incomplete expansion of 3.5 mm non-compliant balloon at 22 atm.   Recommendations: Continue aggressive  secondary prevention. Indefinite dual antiplatelet therapy with aspirin and clopidogrel; discontinuation of aspirin could be considered after 6 months of  therapy. Anticipate same-day discharge if no post-catheterization complication occur. __________   Carlton Adam MPI 03/07/2019: Normal pharmacologic myocardial perfusion stress test without significant ischemia or scar. The left ventricular ejection fraction is normal by visual estimation and Siemens calculation (56%). Dense coronary artery calcifications and/or prior coronary stents are noted on the attenuation correction CT. This is a low risk study. __________   2D echo 03/02/2019: 1. Left ventricular ejection fraction, by visual estimation, is 60 to  65%. The left ventricle has normal function. There is no left ventricular  hypertrophy.   2. Left ventricular diastolic parameters are consistent with Grade I  diastolic dysfunction (impaired relaxation).   3. Global right ventricle has normal systolic function.The right  ventricular size is normal. No increase in right ventricular wall  thickness.   4. Left atrial size was normal.   5. Normal pulmonary artery systolic pressure. __________   [facility] 02/25/2012 [facility]): Patent stents in the proximal and mid RCA, 40% mid RCA stenosis, normal left heart pressures, normal LV systolic function  EKG:  EKG is ordered today.  The EKG ordered today demonstrates ***  Recent Labs: 04/30/2021: Pro B Natriuretic peptide (BNP) 29.0; TSH 1.01 09/16/2021: ALT 28; BUN 20; Creatinine, Ser 1.10; Hemoglobin 13.1; Platelets 214.0; Potassium 3.7; Sodium 138  Recent Lipid Panel    Component Value Date/Time   CHOL 143 07/15/2021 1101   TRIG 216.0 (H) 07/15/2021 1101   HDL 44.40 07/15/2021 1101   CHOLHDL 3 07/15/2021 1101   VLDL 43.2 (H) 07/15/2021 1101   LDLCALC 45 01/30/2020 0803   LDLDIRECT 69.0 07/15/2021 1101    PHYSICAL EXAM:    VS:  There were no vitals taken for this visit.  BMI: There is no height or weight on file to calculate BMI.  Physical Exam  Wt Readings from Last 3 Encounters:  09/16/21 191 lb (86.6 kg)   09/04/21 192 lb (87.1 kg)  07/15/21 196 lb (88.9 kg)     ASSESSMENT & PLAN:   CAD involving the native coronary arteries with stable***angina:  HFpEF:  HTN: Blood pressure  HLD: LDL 69 in 06/2021.  COPD:  Preoperative cardiac risk stratification: He will need revision of his penile prosthesis.  Per [facility] activity status index, he can achieve *** METs without cardiac limitation.  Per [facility]CRI, he is *** risk for noncardiac surgery   {Are you ordering a CV Procedure (e.g. stress test, cath, DCCV, TEE, etc)?   Press F2        :[id number]}     Disposition: F/u with Dr. Saunders Revel or an APP in ***.   Medication Adjustments/Labs and Tests Ordered: Current medicines are reviewed at length with the patient today.  Concerns regarding medicines are outlined above. Medication changes, Labs and Tests ordered today are summarized above and listed in the Patient Instructions accessible in Encounters.   Signed, Christell Faith, PA-C 10/08/2021 8:18 AM     [address], [place] [address] [phone]

## 2021-10-10 ENCOUNTER — Ambulatory Visit: Payer: Federal, State, Local not specified - PPO | Admitting: Physician Assistant

## 2021-10-13 ENCOUNTER — Encounter: Payer: Self-pay | Admitting: Physician Assistant

## 2021-10-15 ENCOUNTER — Other Ambulatory Visit: Payer: Self-pay | Admitting: Internal Medicine

## 2021-10-15 NOTE — Progress Notes (Unsigned)
Follow-up Outpatient Visit Date: 10/16/2021  Primary Care Provider: [name], [name], MD [place] [address]  Chief Complaint: Follow-up coronary artery disease  HPI:  [name] is a 71 y.o. male with history of coronary artery disease status post multiple PCI's, peripheral vascular disease status post bilateral lower extremity stenting followed by Dr. Scot Dock, ulcerative colitis, hypertension, hyperlipidemia, hepatitis C, prostate cancer, and BPH, who presents for follow-up of coronary artery disease.  I last saw him a month ago, at which time [name] complained of exertional dyspnea with mild activity.  He was also concerned about some fluid retention.  He noted that he was only taking clopidogrel every other day due to some nuisance bleeding.  I advised him to take clopidogrel daily given his history of multiple PCI's.  We also changed furosemide to 20 mg daily.  He presents today for reevaluation and preoperative risk assessment in anticipation of revision of a penile prosthesis next month.  Today, [name] reports that he is feeling fairly well.  His only complaint is of some heaviness in his right leg that has been present for many years.  Mild leg edema is stable and did not improve much with escalation of furosemide to daily dosing.  He is urinating more than he was before.  He denies chest pain.  His shortness of breath has been minimal, as he is now using Spiriva correctly on a daily basis.  He also has his albuterol MDI available for exacerbations.  He has not had any palpitations or lightheadedness.  He notes that his home blood pressures are typically higher than today's reading in the office.  He is trying to eat well.  --------------------------------------------------------------------------------------------------  Cardiovascular History & Procedures: Cardiovascular Problems: Coronary artery disease Peripheral vascular disease   Risk  Factors: Known CAD and PAD, hypertension, hyperlipidemia, male gender, and age greater than 73   Cath/PCI: LHC/PTCA (03/21/2020): LMCA with minimal luminal irregularities.  LAD with 30% ostial/proximal disease and 60-70% ostial stenoses of D1 and D2.  Prox/mid LAD stent widely patent.  LCx with 40% proximal/mid stenosis.  RCA with long segment of overlapping stents (prox -> distal) with 70-80% ISR involving the mid-distal segment.  60% ostial rPDA and 70% ostial rPL1 also noted.  Successful FFR-guided angioplasty of mid/distal RCA using a 3.5 x 15 mm [place] balloon at up to 22 atm with reduction in stenosis from 75% -> 20%. RLE PTA (02/19/2014): Right mid and distal SFA laser atherectomy and PTA/stenting. Runoff (02/01/2014): Severe right SFA stenosis with 2-vessel runoff. Abdominal aortogran and runoff/PTA (04/26/2012): Right SFA stenting and left SFA PTA with DCB LHC (02/25/2012): LMCA, LAD, and LCx "without any obtructive lesions).  RCA with 40% mid vessel stenosis.  Proximal and mid RCA stents patent.  LVEF 60% Abdominal aortogram and runoff (02/25/2012): 95% bilateral SFA stenoses.   CV Surgery: None   EP Procedures and Devices: 14-day event monitor (02/19/2021): Predominantly sinus rhythm with rare PACs and PVCs.  Brief PSVT lasting up to 7 beats as well as a single 4 beat run of NSVT also noted.   Non-Invasive Evaluation(s): Pharmacologic MPI (11/25/2020): Low risk study without ischemia or scar.  LVEF 55-65%. TTE (05/14/2020): Normal LV size with mild LVH.  LVEF 55-60% with normal wall motion.  Grade 1 diastolic dysfunction.  Normal RV size and function.  No significant valvular abnormality. ABIs (05/17/2019): Normal ABIs and TBI's bilaterally. Myocardial perfusion stress test (03/07/2019): Normal study without ischemia or scar.  LVEF 56%. TTE (03/02/2019): Normal LV size and wall thickness.  LVEF 60-65% with grade 1 diastolic dysfunction.  Normal RV size and function.  No significant valvular  abnormality. ABIs and bilateral lower extremity Doppler (03/02/2019): ABIs normal (1.1 bilaterally).  Bilateral SFA stents are patent with mild stenosis in the midsegment of the right SFA stent. Abdominal aorta duplex (07/22/2016): No AAA.  Maximal aortic diameter 2.8 cm. TTE (07/22/2016): Normal LV size with mild LVH and grade 1 diastolic dysfunction.  LVEF 65%.  Trace aortic and mitral regurgitation. Pharmacologic MPI (07/21/2016): Normal myocardial perfusion without ischemia or scar. Renal artery duplex (02/19/2012): Normal without evidence of renal artery stenosis.  Recent CV Pertinent Labs: Lab Results  Component Value Date   CHOL 143 07/15/2021   HDL 44.40 07/15/2021   LDLCALC 45 01/30/2020   LDLDIRECT 69.0 07/15/2021   TRIG 216.0 (H) 07/15/2021   CHOLHDL 3 07/15/2021   INR 1.0 09/16/2021   BNP 11.9 10/02/2020   K 3.7 09/16/2021   MG 1.9 08/06/2020   BUN 20 09/16/2021   BUN 14 03/10/2021   CREATININE 1.10 09/16/2021    Past medical and surgical history were reviewed and updated in EPIC.  Current Meds  Medication Sig   acetaminophen (TYLENOL) 500 MG tablet Take 500-1,000 mg by mouth every 6 (six) hours as needed (pain.).   albuterol (VENTOLIN HFA) 108 (90 Base) MCG/ACT inhaler Inhale 2 puffs into the lungs every 6 (six) hours as needed.   amLODipine (NORVASC) 5 MG tablet Take 1 tablet (5 mg total) by mouth daily. D/c 10 mg dose   atorvastatin (LIPITOR) 40 MG tablet Take 1 tablet (40 mg total) by mouth daily.   clopidogrel (PLAVIX) 75 MG tablet Take 1 tablet (75 mg total) by mouth daily.   cyclobenzaprine (FLEXERIL) 10 MG tablet Take 1 tablet (10 mg total) by mouth 2 (two) times daily as needed for muscle spasms.   furosemide (LASIX) 20 MG tablet Take 1 tablet (20 mg total) by mouth daily.   hyoscyamine (LEVBID) 0.375 MG 12 hr tablet TAKE 1 TABLET BY MOUTH EVERY 12 HOURS AS NEEDED   lidocaine (LIDODERM) 5 % Place 1 patch onto the skin daily as needed (pain). Remove patch after 12  hours.   mesalamine (LIALDA) 1.2 g EC tablet Take 2 tablets (2.4 g total) by mouth daily with breakfast.   metoprolol succinate (TOPROL-XL) 100 MG 24 hr tablet TAKE 1 TABLET BY MOUTH EVERY DAY WITH OR IMMEDIATELY FOLLOWING A MEAL   nitroGLYCERIN (NITROSTAT) 0.4 MG SL tablet TAKE 1 TABLET BY MOUTH SUBLINGUALLY EVERY 5 MINS AS NEEDED FOR CHEST PAIN. IF >1 NEEDED, CALL 911   oxyCODONE (OXY IR/ROXICODONE) 5 MG immediate release tablet Take 1 tablet (5 mg total) by mouth every 6 (six) hours as needed for severe pain.   oxyCODONE ER (XTAMPZA ER) 9 MG C12A Take 9 mg by mouth every 12 (twelve) hours as needed.   pantoprazole (PROTONIX) 40 MG tablet TAKE 1 TABLET (40 MG TOTAL) BY MOUTH TWICE A DAY BEFORE MEALS   potassium chloride (MICRO-K) 10 MEQ CR capsule Take 2 capsules (20 mEq total) by mouth daily. D/c tablet   pregabalin (LYRICA) 50 MG capsule Take 50 mg by mouth at bedtime.   RESTASIS 0.05 % ophthalmic emulsion Place 1 drop into both eyes See admin instructions. Instill 1 drop into both eyes (scheduled) every morning & may instill in the evening if needed for eye irritation/dryness   telmisartan-hydrochlorothiazide (MICARDIS HCT) 80-12.5 MG tablet TAKE 1  TABLET BY MOUTH DAILY. IN AM D/C 40-12.5 MG DOSE    Allergies: Cymbalta [duloxetine hcl]  Social History   Tobacco Use   Smoking status: Former    Packs/day: 2.00    Years: 40.00    Total pack years: 80.00    Types: Cigarettes    Quit date: 04/20/1998    Years since quitting: 23.5   Smokeless tobacco: Never   Tobacco comments:    quit in 2000s   Vaping Use   Vaping Use: Never used  Substance Use Topics   Alcohol use: Yes    Comment: occasional   Drug use: Not Currently    Family History  Problem Relation Age of Onset   Heart disease Mother    Stroke Mother    Breast cancer Mother    Diabetes Sister    Diabetes Brother    Colon cancer Neg Hx    Esophageal cancer Neg Hx    Inflammatory bowel disease Neg Hx    Liver disease  Neg Hx    Pancreatic cancer Neg Hx    Rectal cancer Neg Hx    Stomach cancer Neg Hx     Review of Systems: A 12-system review of systems was performed and was negative except as noted in the HPI.  --------------------------------------------------------------------------------------------------  Physical Exam: BP (!) 94/52 (BP Location: Left Arm, Patient Position: Sitting, Cuff Size: Large)   Pulse 77   Ht 5' 7"  (1.702 m)   Wt 193 lb (87.5 kg)   SpO2 98%   BMI 30.23 kg/m  Repeat BP: 104/60  General:  NAD. Neck: No JVD or HJR. Lungs: Clear to auscultation bilaterally without wheezes or crackles. Heart: Regular rate and rhythm without murmurs, rubs, or gallops. Abdomen: Soft, nontender, nondistended. Extremities: No lower extremity edema.  EKG: Normal sinus rhythm with inferior infarct.  No significant change from prior tracing on 09/04/2021.  Lab Results  Component Value Date   WBC 5.9 09/16/2021   HGB 13.1 09/16/2021   HCT 39.4 09/16/2021   MCV 97.0 09/16/2021   PLT 214.0 09/16/2021    Lab Results  Component Value Date   NA 138 09/16/2021   K 3.7 09/16/2021   CL 105 09/16/2021   CO2 25 09/16/2021   BUN 20 09/16/2021   CREATININE 1.10 09/16/2021   GLUCOSE 106 (H) 09/16/2021   ALT 28 09/16/2021    Lab Results  Component Value Date   CHOL 143 07/15/2021   HDL 44.40 07/15/2021   LDLCALC 45 01/30/2020   LDLDIRECT 69.0 07/15/2021   TRIG 216.0 (H) 07/15/2021   CHOLHDL 3 07/15/2021    --------------------------------------------------------------------------------------------------  ASSESSMENT AND PLAN: Coronary artery disease with stable angina: No chest pain or worsening dyspnea reported.  Continue current medications for antianginal therapy including amlodipine and metoprolol.  Continue long-term clopidogrel as well, though this can be temporarily interrupted for upcoming urologic surgery as detailed below.  Continue secondary prevention with  atorvastatin.  PAD: No leg pain reported though [name] has some "heaviness" in his right leg that has been chronic.  I encouraged him to follow-up with Dr. Doren Custard, his vascular surgeon, for ongoing surveillance.  Continue lipid and antiplatelet therapy.  Hypertension: Blood pressure initially a little low, improved but still low normal on recheck.  [name] is asymptomatic and notes that his blood pressures are typically a bit higher at home.  We will make furosemide as needed rather than standing.  He should otherwise continue his current regimen consisting of amlodipine,  metoprolol, and telmisartan-HCTZ.  Chronic HFpEF: [name] appears euvolemic with NYHA class II symptoms.  Due to soft blood pressure and lack of significant edema, we will make his furosemide 20 mg daily as needed for edema/weight gain.  Preoperative cardiovascular risk assessment: [name] does not have any unstable cardiac symptoms.  I think it is reasonable for him to proceed with upcoming penile prosthesis revision, which is a low risk from a cardiac standpoint.  Clopidogrel can be held for 5-7 days before the surgery and restarted when it is felt safe to do so by his urologist.  Aspirin 81 mg daily should be taken during the period that [name] is off clopidogrel given his history of multiple coronary and lower extremity stents.  Follow-up: Return to clinic in 6 months.  Nelva Bush, MD 10/16/2021 8:08 AM

## 2021-10-16 ENCOUNTER — Encounter: Payer: Self-pay | Admitting: Internal Medicine

## 2021-10-16 ENCOUNTER — Ambulatory Visit (INDEPENDENT_AMBULATORY_CARE_PROVIDER_SITE_OTHER): Payer: Medicare Other | Admitting: Internal Medicine

## 2021-10-16 VITALS — BP 94/52 | HR 77 | Ht 67.0 in | Wt 193.0 lb

## 2021-10-16 DIAGNOSIS — I5032 Chronic diastolic (congestive) heart failure: Secondary | ICD-10-CM | POA: Diagnosis not present

## 2021-10-16 DIAGNOSIS — Z01810 Encounter for preprocedural cardiovascular examination: Secondary | ICD-10-CM

## 2021-10-16 DIAGNOSIS — I25118 Atherosclerotic heart disease of native coronary artery with other forms of angina pectoris: Secondary | ICD-10-CM

## 2021-10-16 DIAGNOSIS — I10 Essential (primary) hypertension: Secondary | ICD-10-CM | POA: Diagnosis not present

## 2021-10-16 DIAGNOSIS — I739 Peripheral vascular disease, unspecified: Secondary | ICD-10-CM | POA: Diagnosis not present

## 2021-10-16 MED ORDER — FUROSEMIDE 20 MG PO TABS: 20.0000 mg | ORAL_TABLET | ORAL | 0 refills | Status: DC | PRN

## 2021-10-16 NOTE — Patient Instructions (Signed)
Medication Instructions:   Your physician has recommended you make the following change in your medication:   CHANGE Lasix (furosemide) 20 mg to only AS NEEDED for weight gain or edema (swelling)  WHILE HOLDING Plavix (clopidogrel) 1 week prior to surgery TAKE Aspirin 81 mg daily  STOP Aspirin 81 mg daily when you RESUME Plavix (clopidogrel) after surgery  *If you need a refill on your cardiac medications before your next appointment, please call your pharmacy*   Lab Work:  None ordered  Testing/Procedures:  None ordered   Follow-Up: At [facility], you and your health needs are our priority.  As part of our continuing mission to provide you with exceptional heart care, we have created designated Provider Care Teams.  These Care Teams include your primary Cardiologist (physician) and Advanced Practice Providers (APPs -  Physician Assistants and Nurse Practitioners) who all work together to provide you with the care you need, when you need it.  We recommend signing up for the patient portal called "MyChart".  Sign up information is provided on this After Visit Summary.  MyChart is used to connect with patients for Virtual Visits (Telemedicine).  Patients are able to view lab/test results, encounter notes, upcoming appointments, etc.  Non-urgent messages can be sent to your provider as well.   To learn more about what you can do with MyChart, go to [email or web address].    Your next appointment:   6 month(s)  The format for your next appointment:   In Person  Provider:   You may see Nelva Bush, MD or one of the following Advanced Practice Providers on your designated Care Team:   Murray Hodgkins, NP Christell Faith, PA-C Cadence Kathlen Mody, [place]    Other Instructions  We recommend that you reach out to vascular surgery to schedule follow up appointment  Important Information About Sugar

## 2021-11-03 ENCOUNTER — Encounter (HOSPITAL_BASED_OUTPATIENT_CLINIC_OR_DEPARTMENT_OTHER): Payer: Self-pay | Admitting: Urology

## 2021-11-03 ENCOUNTER — Other Ambulatory Visit: Payer: Self-pay

## 2021-11-03 NOTE — Progress Notes (Addendum)
Spoke w/ via phone for pre-op interview---pt Lab needs dos----I stat               Lab results------ekg 10-16-2021 epic COVID test -----patient states asymptomatic no test needed Arrive at -------1130 am 11-11-2021 NPO after MN NO Solid Food.  Clear liquids from MN until---1030 am Med rec completed Medications to take morning of surgery -----amlodipine, atorvastatin, mesalamine, metorpolol succinate, spiriva, albuterol inhaler, extampza, 81 mg aspirin Diabetic medication -----n/a Patient instructed no nail polish to be worn day of surgery Patient instructed to bring photo id and insurance card day of surgery Patient aware to have Driver (ride ) / caregiver wife Pamala Hurry   for 24 hours after surgery  Patient Special Instructions -----pt given overnight stay instructions Pre-Op special Istructions ----- Patient verbalized understanding of instructions that were given at this phone interview. Patient denies shortness of breath, chest pain, fever, cough at this phone interview.   Cardiac clearance note dr end 10-16-2021  chart/epic, pt aware to stop plavix 5 to 7 days before surgery, pt to take 81 mg aspirin while plavix stopped per dr end note  Ekg 10-16-2021  chart/epic  Spoke with dr Gifford Shave mda and pt can take 81 mg aspirin day of surgery per dr Gifford Shave  Left message with zona at dr Cain Sieve office, please make dr Cain Sieve aware pt taking 81 mg aspirin day of surgery,  Addendum: spoke with zona and dr Cain Sieve ok with patient taking 81 mg aspirin day of surgery

## 2021-11-04 ENCOUNTER — Other Ambulatory Visit: Payer: Self-pay | Admitting: Internal Medicine

## 2021-11-04 NOTE — Telephone Encounter (Signed)
Called and advised pt to request future refills of Spiriva to PCP or pulmonary. Pt voiced understanding.

## 2021-11-11 ENCOUNTER — Encounter (HOSPITAL_BASED_OUTPATIENT_CLINIC_OR_DEPARTMENT_OTHER): Payer: Self-pay | Admitting: Urology

## 2021-11-11 ENCOUNTER — Other Ambulatory Visit: Payer: Self-pay

## 2021-11-11 ENCOUNTER — Ambulatory Visit (HOSPITAL_BASED_OUTPATIENT_CLINIC_OR_DEPARTMENT_OTHER): Payer: Medicare Other | Admitting: Anesthesiology

## 2021-11-11 ENCOUNTER — Observation Stay (HOSPITAL_BASED_OUTPATIENT_CLINIC_OR_DEPARTMENT_OTHER)
Admission: RE | Admit: 2021-11-11 | Discharge: 2021-11-12 | Disposition: A | Discharge status: Home / Self Care | Payer: Medicare Other | Source: Ambulatory Visit | Attending: Urology | Admitting: Urology

## 2021-11-11 ENCOUNTER — Encounter (HOSPITAL_BASED_OUTPATIENT_CLINIC_OR_DEPARTMENT_OTHER)
Admission: RE | Disposition: A | Discharge status: Home / Self Care | Payer: Self-pay | Source: Ambulatory Visit | Attending: Urology

## 2021-11-11 DIAGNOSIS — Y828 Other medical devices associated with adverse incidents: Secondary | ICD-10-CM | POA: Insufficient documentation

## 2021-11-11 DIAGNOSIS — N529 Male erectile dysfunction, unspecified: Secondary | ICD-10-CM | POA: Diagnosis not present

## 2021-11-11 DIAGNOSIS — Z955 Presence of coronary angioplasty implant and graft: Secondary | ICD-10-CM | POA: Insufficient documentation

## 2021-11-11 DIAGNOSIS — Z8546 Personal history of malignant neoplasm of prostate: Secondary | ICD-10-CM | POA: Insufficient documentation

## 2021-11-11 DIAGNOSIS — Z8616 Personal history of COVID-19: Secondary | ICD-10-CM | POA: Insufficient documentation

## 2021-11-11 DIAGNOSIS — Z79899 Other long term (current) drug therapy: Secondary | ICD-10-CM | POA: Insufficient documentation

## 2021-11-11 DIAGNOSIS — I10 Essential (primary) hypertension: Secondary | ICD-10-CM | POA: Diagnosis not present

## 2021-11-11 DIAGNOSIS — Z7982 Long term (current) use of aspirin: Secondary | ICD-10-CM | POA: Insufficient documentation

## 2021-11-11 DIAGNOSIS — J449 Chronic obstructive pulmonary disease, unspecified: Secondary | ICD-10-CM | POA: Diagnosis not present

## 2021-11-11 DIAGNOSIS — Z960 Presence of urogenital implants: Secondary | ICD-10-CM

## 2021-11-11 DIAGNOSIS — Z87891 Personal history of nicotine dependence: Secondary | ICD-10-CM

## 2021-11-11 DIAGNOSIS — T83490A Other mechanical complication of penile (implanted) prosthesis, initial encounter: Principal | ICD-10-CM | POA: Insufficient documentation

## 2021-11-11 DIAGNOSIS — I2510 Atherosclerotic heart disease of native coronary artery without angina pectoris: Secondary | ICD-10-CM | POA: Diagnosis not present

## 2021-11-11 DIAGNOSIS — Z01818 Encounter for other preprocedural examination: Secondary | ICD-10-CM

## 2021-11-11 HISTORY — PX: REMOVAL OF PENILE PROSTHESIS: SHX6059

## 2021-11-11 HISTORY — DX: Presence of urogenital implants: Z96.0

## 2021-11-11 LAB — POCT I-STAT, CHEM 8
BUN: 17 mg/dL (ref 8–23)
Calcium, Ion: 1.22 mmol/L (ref 1.15–1.40)
Chloride: 107 mmol/L (ref 98–111)
Creatinine, Ser: 0.9 mg/dL (ref 0.61–1.24)
Glucose, Bld: 111 mg/dL — ABNORMAL HIGH (ref 70–99)
HCT: 48 % (ref 39.0–52.0)
Hemoglobin: 16.3 g/dL (ref 13.0–17.0)
Potassium: 3.9 mmol/L (ref 3.5–5.1)
Sodium: 143 mmol/L (ref 135–145)
TCO2: 23 mmol/L (ref 22–32)

## 2021-11-11 SURGERY — REMOVAL, PENILE PROSTHESIS
Anesthesia: General | Site: Penis

## 2021-11-11 MED ORDER — VANCOMYCIN HCL 1500 MG/300ML IV SOLN
1500.0000 mg | INTRAVENOUS | Status: AC
  Administered 2021-11-11: 1500 mg via INTRAVENOUS
  Filled 2021-11-11: qty 300

## 2021-11-11 MED ORDER — LIDOCAINE 2% (20 MG/ML) 5 ML SYRINGE
INTRAMUSCULAR | Status: DC | PRN
  Administered 2021-11-11: 60 mg via INTRAVENOUS

## 2021-11-11 MED ORDER — BUPIVACAINE HCL 0.5 % IJ SOLN
INTRAMUSCULAR | Status: DC | PRN
  Administered 2021-11-11: 11.5 mL

## 2021-11-11 MED ORDER — LIDOCAINE HCL 1 % IJ SOLN
INTRAMUSCULAR | Status: DC | PRN
  Administered 2021-11-11: 11.5 mL

## 2021-11-11 MED ORDER — PREGABALIN 50 MG PO CAPS
50.0000 mg | ORAL_CAPSULE | Freq: Every day | ORAL | Status: DC
  Administered 2021-11-11: 50 mg via ORAL
  Filled 2021-11-11: qty 1

## 2021-11-11 MED ORDER — METOPROLOL SUCCINATE ER 100 MG PO TB24
100.0000 mg | ORAL_TABLET | Freq: Every day | ORAL | Status: DC
  Filled 2021-11-11: qty 1

## 2021-11-11 MED ORDER — CELECOXIB 100 MG PO CAPS
100.0000 mg | ORAL_CAPSULE | Freq: Two times a day (BID) | ORAL | Status: DC
Start: 2021-11-11 — End: 2021-11-12
  Administered 2021-11-11: 100 mg via ORAL
  Filled 2021-11-11: qty 1

## 2021-11-11 MED ORDER — ONDANSETRON HCL 4 MG/2ML IJ SOLN
INTRAMUSCULAR | Status: DC | PRN
  Administered 2021-11-11: 4 mg via INTRAVENOUS

## 2021-11-11 MED ORDER — ACETAMINOPHEN 325 MG PO TABS
ORAL_TABLET | ORAL | Status: AC
  Filled 2021-11-11: qty 2

## 2021-11-11 MED ORDER — FENTANYL CITRATE (PF) 100 MCG/2ML IJ SOLN
INTRAMUSCULAR | Status: AC
  Filled 2021-11-11: qty 2

## 2021-11-11 MED ORDER — DEXAMETHASONE SODIUM PHOSPHATE 10 MG/ML IJ SOLN
INTRAMUSCULAR | Status: AC
  Filled 2021-11-11: qty 1

## 2021-11-11 MED ORDER — PROPOFOL 10 MG/ML IV BOLUS
INTRAVENOUS | Status: AC
  Filled 2021-11-11: qty 20

## 2021-11-11 MED ORDER — VANCOMYCIN HCL IN DEXTROSE 1-5 GM/200ML-% IV SOLN
1000.0000 mg | Freq: Once | INTRAVENOUS | Status: AC
  Administered 2021-11-12: 1000 mg via INTRAVENOUS

## 2021-11-11 MED ORDER — GENTAMICIN SULFATE 40 MG/ML IJ SOLN
120.0000 mg | INTRAVENOUS | Status: AC
  Administered 2021-11-11: 120 mg via INTRAVENOUS
  Filled 2021-11-11: qty 3

## 2021-11-11 MED ORDER — OXYCODONE HCL 5 MG/5ML PO SOLN: 5.0000 mg | Freq: Once | ORAL | Status: DC | PRN

## 2021-11-11 MED ORDER — DEXAMETHASONE SODIUM PHOSPHATE 4 MG/ML IJ SOLN
INTRAMUSCULAR | Status: DC | PRN
  Administered 2021-11-11: 10 mg via INTRAVENOUS

## 2021-11-11 MED ORDER — ACETAMINOPHEN 500 MG PO TABS
ORAL_TABLET | ORAL | Status: AC
  Filled 2021-11-11: qty 2

## 2021-11-11 MED ORDER — PHENYLEPHRINE 80 MCG/ML (10ML) SYRINGE FOR IV PUSH (FOR BLOOD PRESSURE SUPPORT)
PREFILLED_SYRINGE | INTRAVENOUS | Status: AC
  Filled 2021-11-11: qty 10

## 2021-11-11 MED ORDER — ATORVASTATIN CALCIUM 40 MG PO TABS
40.0000 mg | ORAL_TABLET | Freq: Every day | ORAL | Status: DC
  Administered 2021-11-11: 40 mg via ORAL
  Filled 2021-11-11: qty 1

## 2021-11-11 MED ORDER — LIDOCAINE HCL (PF) 2 % IJ SOLN
INTRAMUSCULAR | Status: AC
  Filled 2021-11-11: qty 5

## 2021-11-11 MED ORDER — PROPOFOL 10 MG/ML IV BOLUS
INTRAVENOUS | Status: DC | PRN
  Administered 2021-11-11 (×2): 50 mg via INTRAVENOUS

## 2021-11-11 MED ORDER — AMLODIPINE BESYLATE 5 MG PO TABS
5.0000 mg | ORAL_TABLET | Freq: Every day | ORAL | Status: DC
  Filled 2021-11-11: qty 1

## 2021-11-11 MED ORDER — ALBUTEROL SULFATE HFA 108 (90 BASE) MCG/ACT IN AERS: 2.0000 | INHALATION_SPRAY | Freq: Four times a day (QID) | RESPIRATORY_TRACT | Status: DC | PRN

## 2021-11-11 MED ORDER — SODIUM CHLORIDE 0.9 % IV SOLN
Freq: Once | INTRAVENOUS | Status: DC
  Filled 2021-11-11: qty 20

## 2021-11-11 MED ORDER — MIDAZOLAM HCL 5 MG/5ML IJ SOLN
INTRAMUSCULAR | Status: DC | PRN
  Administered 2021-11-11: 2 mg via INTRAVENOUS

## 2021-11-11 MED ORDER — OXYCODONE HCL 5 MG PO TABS
ORAL_TABLET | ORAL | Status: AC
  Filled 2021-11-11: qty 1

## 2021-11-11 MED ORDER — SODIUM CHLORIDE 0.9 % IR SOLN
Status: DC | PRN
  Administered 2021-11-11: 1000 mL

## 2021-11-11 MED ORDER — OXYCODONE HCL 5 MG PO TABS
5.0000 mg | ORAL_TABLET | Freq: Four times a day (QID) | ORAL | Status: DC | PRN
  Administered 2021-11-11 – 2021-11-12 (×3): 5 mg via ORAL

## 2021-11-11 MED ORDER — ONDANSETRON HCL 4 MG/2ML IJ SOLN
INTRAMUSCULAR | Status: AC
  Filled 2021-11-11: qty 2

## 2021-11-11 MED ORDER — PHENYLEPHRINE HCL (PRESSORS) 10 MG/ML IV SOLN
INTRAVENOUS | Status: DC | PRN
  Administered 2021-11-11: 80 ug via INTRAVENOUS

## 2021-11-11 MED ORDER — ACETAMINOPHEN 500 MG PO TABS
1000.0000 mg | ORAL_TABLET | Freq: Once | ORAL | Status: AC
  Administered 2021-11-11: 1000 mg via ORAL

## 2021-11-11 MED ORDER — MIDAZOLAM HCL 2 MG/2ML IJ SOLN
INTRAMUSCULAR | Status: AC
  Filled 2021-11-11: qty 2

## 2021-11-11 MED ORDER — FENTANYL CITRATE (PF) 100 MCG/2ML IJ SOLN: 25.0000 ug | INTRAMUSCULAR | Status: DC | PRN

## 2021-11-11 MED ORDER — CYCLOBENZAPRINE HCL 10 MG PO TABS: 10.0000 mg | ORAL_TABLET | Freq: Two times a day (BID) | ORAL | Status: DC | PRN

## 2021-11-11 MED ORDER — MESALAMINE 1.2 G PO TBEC
2.4000 g | DELAYED_RELEASE_TABLET | Freq: Every day | ORAL | Status: DC
  Administered 2021-11-12: 2.4 g via ORAL
  Filled 2021-11-11: qty 2

## 2021-11-11 MED ORDER — FENTANYL CITRATE (PF) 100 MCG/2ML IJ SOLN
INTRAMUSCULAR | Status: DC | PRN
Start: 2021-11-11 — End: 2021-11-11
  Administered 2021-11-11 (×6): 50 ug via INTRAVENOUS

## 2021-11-11 MED ORDER — SODIUM CHLORIDE 0.9 % IV SOLN
INTRAVENOUS | Status: DC
  Administered 2021-11-11: 1 mL via INTRAVENOUS

## 2021-11-11 MED ORDER — CHLORHEXIDINE GLUCONATE 4 % EX LIQD: Freq: Once | CUTANEOUS | Status: DC

## 2021-11-11 MED ORDER — IRRISEPT - 450ML BOTTLE WITH 0.05% CHG IN STERILE WATER, USP 99.95% OPTIME
TOPICAL | Status: DC | PRN
  Administered 2021-11-11 (×2): 450 mL

## 2021-11-11 MED ORDER — LACTATED RINGERS IV SOLN: INTRAVENOUS | Status: DC

## 2021-11-11 MED ORDER — OXYCODONE HCL 5 MG PO TABS: 5.0000 mg | ORAL_TABLET | Freq: Once | ORAL | Status: DC | PRN

## 2021-11-11 MED ORDER — ONDANSETRON HCL 4 MG/2ML IJ SOLN: 4.0000 mg | Freq: Once | INTRAMUSCULAR | Status: DC | PRN

## 2021-11-11 MED ORDER — ACETAMINOPHEN 325 MG PO TABS
650.0000 mg | ORAL_TABLET | Freq: Four times a day (QID) | ORAL | Status: DC
  Administered 2021-11-11 – 2021-11-12 (×3): 650 mg via ORAL

## 2021-11-11 SURGICAL SUPPLY — 64 items
ADH SKN CLS APL DERMABOND .7 (GAUZE/BANDAGES/DRESSINGS) ×1
APL PRP STRL LF DISP 70% ISPRP (MISCELLANEOUS) ×1
BAG DRN RND TRDRP ANRFLXCHMBR (UROLOGICAL SUPPLIES) ×1
BAG URINE DRAIN 2000ML AR STRL (UROLOGICAL SUPPLIES) ×2 IMPLANT
BIOPATCH BLUE 3/4IN DISK W/1.5 (GAUZE/BANDAGES/DRESSINGS) ×1 IMPLANT
BLADE EXTENDED COATED 6.5IN (ELECTRODE) ×1 IMPLANT
BLADE SURG 15 STRL LF DISP TIS (BLADE) ×1 IMPLANT
BLADE SURG 15 STRL SS (BLADE) ×2
BNDG GAUZE DERMACEA FLUFF (GAUZE/BANDAGES/DRESSINGS) ×1
BNDG GAUZE DERMACEA FLUFF 4 (GAUZE/BANDAGES/DRESSINGS) ×1 IMPLANT
BNDG GZE DERMACEA 4 6PLY (GAUZE/BANDAGES/DRESSINGS) ×1
CATH COUDE FOLEY 2W 5CC 18FR (CATHETERS) ×1 IMPLANT
CHLORAPREP W/TINT 26 (MISCELLANEOUS) ×2 IMPLANT
COVER BACK TABLE 60X90IN (DRAPES) ×2 IMPLANT
COVER MAYO STAND STRL (DRAPES) ×4 IMPLANT
DERMABOND ADVANCED (GAUZE/BANDAGES/DRESSINGS) ×1
DERMABOND ADVANCED .7 DNX12 (GAUZE/BANDAGES/DRESSINGS) ×1 IMPLANT
DRAIN CHANNEL 10F 3/8 F FF (DRAIN) ×2 IMPLANT
DRAPE INCISE IOBAN 66X45 STRL (DRAPES) ×2 IMPLANT
DRAPE LAPAROTOMY 100X72 PEDS (DRAPES) ×2 IMPLANT
DRAPE UTILITY XL STRL (DRAPES) ×1 IMPLANT
EVACUATOR DRAINAGE 7X20 100CC (MISCELLANEOUS) IMPLANT
EVACUATOR SILICONE 100CC (MISCELLANEOUS) ×2
GAUZE 4X4 16PLY ~~LOC~~+RFID DBL (SPONGE) ×2 IMPLANT
GAUZE SPONGE 4X4 12PLY STRL (GAUZE/BANDAGES/DRESSINGS) ×2 IMPLANT
GLOVE BIO SURGEON STRL SZ7 (GLOVE) ×3 IMPLANT
GLOVE BIO SURGEON STRL SZ7.5 (GLOVE) ×7 IMPLANT
GLOVE BIOGEL PI IND STRL 6 (GLOVE) IMPLANT
GLOVE BIOGEL PI IND STRL 7.0 (GLOVE) IMPLANT
GLOVE BIOGEL PI IND STRL 7.5 (GLOVE) IMPLANT
GLOVE BIOGEL PI INDICATOR 6 (GLOVE) ×3
GLOVE BIOGEL PI INDICATOR 7.0 (GLOVE) ×5
GLOVE BIOGEL PI INDICATOR 7.5 (GLOVE) ×1
GLOVE ECLIPSE 6.0 STRL STRAW (GLOVE) ×3 IMPLANT
GOWN STRL REUS W/TWL LRG LVL3 (GOWN DISPOSABLE) ×4 IMPLANT
HIBICLENS CHG 4% 4OZ (MISCELLANEOUS) ×1 IMPLANT
HOLDER FOLEY CATH W/STRAP (MISCELLANEOUS) ×2 IMPLANT
IV NS 1000ML (IV SOLUTION) ×2
IV NS 1000ML BAXH (IV SOLUTION) IMPLANT
JET LAVAGE IRRISEPT WOUND (IRRIGATION / IRRIGATOR) ×2
KIT ACCESSORY AMS 700 PUMP (UROLOGICAL SUPPLIES) ×1 IMPLANT
KIT TURNOVER CYSTO (KITS) ×2 IMPLANT
LAVAGE JET IRRISEPT WOUND (IRRIGATION / IRRIGATOR) IMPLANT
PACK BASIN DAY SURGERY FS (CUSTOM PROCEDURE TRAY) ×2 IMPLANT
PANTS MESH DISP LRG (UNDERPADS AND DIAPERS) ×2 IMPLANT
PENCIL SMOKE EVACUATOR (MISCELLANEOUS) ×2 IMPLANT
PLUG CATH AND CAP STER (CATHETERS) ×2 IMPLANT
PUMP PENILE AMS 700CX MS 12X21 (Erectile Restoration) ×1 IMPLANT
REAR TIP EXTENDER ×1 IMPLANT
RESERVOIR FLAT IZ 100ML (Miscellaneous) ×1 IMPLANT
RETRACTOR DEEP SCROTAL PENILE (MISCELLANEOUS) ×1 IMPLANT
SUPPORT SCROTAL LG STRP (MISCELLANEOUS) ×1 IMPLANT
SUT MON AB 3-0 SH 27 (SUTURE) ×8
SUT MON AB 3-0 SH27 (SUTURE) ×4 IMPLANT
SUT VIC AB 2-0 UR6 27 (SUTURE) ×7 IMPLANT
SYR 10ML LL (SYRINGE) ×6 IMPLANT
SYR 50ML LL SCALE MARK (SYRINGE) ×4 IMPLANT
SYR BULB IRRIG 60ML STRL (SYRINGE) ×2 IMPLANT
SYR CONTROL 10ML LL (SYRINGE) ×1 IMPLANT
TOWEL OR 17X26 10 PK STRL BLUE (TOWEL DISPOSABLE) ×3 IMPLANT
TRAY DSU PREP LF (CUSTOM PROCEDURE TRAY) ×2 IMPLANT
TUBE CONNECTING 12X1/4 (SUCTIONS) ×2 IMPLANT
WATER STERILE IRR 500ML POUR (IV SOLUTION) ×1 IMPLANT
YANKAUER SUCT BULB TIP NO VENT (SUCTIONS) ×4 IMPLANT

## 2021-11-11 NOTE — Op Note (Signed)
PATIENT:  Erik Nicholson  PRE-OPERATIVE DIAGNOSIS:  1. Malfunctioning penile prosthesis 2.Organic erectile dysfunction  POST-OPERATIVE DIAGNOSIS:  Same  PROCEDURE:  Procedure(s): 1. Explant of 3-piece IPP 2. Replacing new 3 piece inflatable penile prosthesis (BS/AMS)  SURGEON:  Donald Pore MD  ASST: Lennette Bihari , MD  ANESTHESIA:  General  EBL:  Minimal  Device: 3 piece AMS CX 700: 100 cc conceal reservoir, 21 cm cylinders and 2 cm rear-tip extenders on right and left sides  LOCAL MEDICATIONS USED:  None  SPECIMEN: None  DISPOSITION OF SPECIMEN:  N/A  Description of procedure: The patient was taken to the major operating room, placed on the table and administered general anesthesia in the supine position. His genitalia was then prepped with chlorhexidine x 2. He was draped in the usual sterile fashion, and I used [place] on the field. An official timeout was then performed.  A 16 French coude catheter was then placed in the bladder and the bladder was drained and the catheter was plugged. A midline penoscrotal incision was then made and the dissection was carried down to the corpora and urethra. The lonestar retractor was positioned so as to have excellent exposure.   We dissected along the old implant and initially freed up the pump. We then followed the cylinder tubing to the reservoir which was carefully disstected out of the space of retzius intact. Lastly, each of the cylinders were carefully dissected out of the corpora, including the implant and the RTEs bilaterally.   2-0 Vicryl sutures were then placed in each corpus cavernosum to serve as stay sutures. Field goal post tests were performed and there was no evidence of perforations or crossover. I then irrigated the corpus cavernosum with antibiotic solution and measured the distance proximally and distally from the stay suture and was found to be 11 and 12 cm, respectively on the left.I then turned my attention to the contralateral  corpus cavernosum and placed my stay sutures, made my corporotomy and dilated the corpus cavernosum in an identical fashion. This was measured and also was found to be 11.5 cm proximally and 12 distally. It was irrigated with anastomotic solution as was the scrotum. I then chose a 21 cm cylinder set with 2 cm rear-tip extenders and these were prepped while I prepared the site for reservoir placement.  I then digitally probed into the R external inguinal ring and identified the space of retzius where the previous reservoir was located. I used my finger to ensure I was in the appropriate space, and to clear room for the reservoir. I irrigated the space with anastomotic solution and then placed the reservoir in this location. I then filled the reservoir with 95 cc of sterile saline, and checked to confirm proper position. There was minimal backpressure with the reservoir max-filled.  Attention was redirected to the corporotomies where the cylinders were then placed by first fixing the suture to the distal aspect of the right cylinder to a straight needle. This was then loaded on the [facility] inserter and passed through the corporotomy and distally. I then advanced the straight needle with the [place] inserter out through the glans and this was grasped with a hemostat and pulled through the glans and the suture was secured with a hemostat. I then performed an identical maneuver on the contralateral side. After this was performed I irrigated both corpus cavernosum; there was no evidence of urethral perforation. I inserted the distal portion of the cylinder through the corporotomies and pulled this to  the end of the corpora with the suture. The proximal aspect with the rear-tip extender was then passed through the corporotomy and into the seated position on each side. I then connected reservoir tubing to a syringe filled with sterile saline and inflated the device. I noted a good straight erection with both cylinders  equidistant under the glans and no buckling of the cylinders. There was some slight leftward curvature - as mentioned above a goal post test was performed to ensure there was no distal crossover. I therefore deflated the device and closed the corporotomies with used my previously placed stay sutures. The cylinder was then connected to the pump after excising the excess tubing with appropriate shodded hemostats in place and then I used the supplied connectors to make the connection. I then again cycled the device with the pump and it cycled properly. I deflated the device and pumped it up about three quarters of the way to aid with hemostasis. I irrigated the scrotum once again with metabolic solution.  I then grasped the scrotal skin in the midline with a babcock, and used a hemostat to dissect down to the dependent-most portion of the scrotum. The nasal speculum was inserted into this space, and facilitated placement of the pump. I irrigated the wound one last time with antibiotic irrigation and then closed the deep scrotal tissue over the tubing and pump with running 3-0 monocryl suture. I placed a 10 Fr blake drain over the corporotomies. A second layer was then closed over this first layer with running 3- 0 monocryl, and running skin suture w/ 4-0 monocryl performed. Incision dressed with dermabond.  A mummy wrap was applied. The catheter was connected to closed system drainage, and drain connected to suction bulb and the patient was awakened and taken recovery room in stable and satisfactory condition. He tolerated the procedure well and there were no intraoperative complications. Needle sponge and instrument counts were correct at the end of the operation.  Patient to be admitted overnight with foley, drain, and mummy wrap. Foley to be removed in the morning, and drain likely dc'd pending output.

## 2021-11-11 NOTE — Anesthesia Procedure Notes (Signed)
Procedure Name: LMA Insertion Date/Time: 11/11/2021 1:14 PM  Performed by: Justice Rocher, CRNAPre-anesthesia Checklist: Patient identified, Emergency Drugs available, Suction available, Patient being monitored and Timeout performed Patient Re-evaluated:Patient Re-evaluated prior to induction Oxygen Delivery Method: Circle system utilized Preoxygenation: Pre-oxygenation with 100% oxygen Induction Type: IV induction Ventilation: Mask ventilation without difficulty LMA: LMA inserted LMA Size: 5.0 Number of attempts: 1 Airway Equipment and Method: Bite block Placement Confirmation: positive ETCO2, breath sounds checked- equal and bilateral and CO2 detector Tube secured with: Tape Dental Injury: Teeth and Oropharynx as per pre-operative assessment  Comments: Teeth poor condition - chipped, missing crowding discolored preop

## 2021-11-11 NOTE — H&P (Signed)
H&P  History of Present Illness: Erik Nicholson is a 71 y.o. year old who presents today for penile implant explant + reimplantation due to a malfunctioning IPP. No acute complaints today  Past Medical History:  Diagnosis Date   Arthritis    both hands   BPH (benign prostatic hyperplasia)    CAD (coronary artery disease)    x 7 cardiac stents   Chronic back pain    L4/5 with chronic right leg pain    COVID-19    10/20/20   Floater, vitreous, left    Hx of hepatitis C    treated with Harvoni in 2014   Hyperlipidemia    Hypertension    Neuromuscular disorder ([place])    nerve damage in back   Neuropathy    tingling in toes @ times   Prostate cancer ([place])    fall 2022, radiation therapy done dr Tammi Klippel   PVD (peripheral vascular disease) ([place])    2 stents right leg and 1 in left    Ulcerative colitis ([place])    in remission since fall of 2022    Past Surgical History:  Procedure Laterality Date   back surgery     x 2 lumbar last 09/2016 in [place]  06/02/2021   Procedure: BIOPSY;  Surgeon: Irving Copas., MD;  Location: [facility];  Service: Gastroenterology;;  EGD and COLON   CARDIAC CATHETERIZATION     CATARACT EXTRACTION, BILATERAL     COLONOSCOPY     last 09/2018 in [place] N/A 06/02/2021   Procedure: COLONOSCOPY WITH PROPOFOL;  Surgeon: Irving Copas., MD;  Location: [facility];  Service: Gastroenterology;  Laterality: N/A;   CORONARY BALLOON ANGIOPLASTY N/A 03/21/2020   Procedure: CORONARY BALLOON ANGIOPLASTY;  Surgeon: Nelva Bush, MD;  Location: [facility];  Service: Cardiovascular;  Laterality: N/A;   ESOPHAGOGASTRODUODENOSCOPY (EGD) WITH PROPOFOL N/A 06/02/2021   Procedure: ESOPHAGOGASTRODUODENOSCOPY (EGD) WITH PROPOFOL;  Surgeon: Rush Landmark Telford Nab., MD;  Location: [facility];  Service: Gastroenterology;  Laterality: N/A;   HERNIA REPAIR     right    INTRAVASCULAR PRESSURE  WIRE/FFR STUDY N/A 03/21/2020   Procedure: INTRAVASCULAR PRESSURE WIRE/FFR STUDY;  Surgeon: Nelva Bush, MD;  Location: [facility];  Service: Cardiovascular;  Laterality: N/A;   LEFT HEART CATH AND CORONARY ANGIOGRAPHY N/A 03/21/2020   Procedure: LEFT HEART CATH AND CORONARY ANGIOGRAPHY;  Surgeon: Nelva Bush, MD;  Location: [facility];  Service: Cardiovascular;  Laterality: N/A;   PENILE PROSTHESIS IMPLANT  1995   PERCUTANEOUS CORONARY STENT INTERVENTION (PCI-S)     x 7-8 heart   PERIPHERAL ARTERIAL STENT GRAFT     PROSTATE BIOPSY     pvd     with stenting x 2 right leg below knee and 1 left thigh    SHOULDER ARTHROSCOPY Bilateral    UPPER ESOPHAGEAL ENDOSCOPIC ULTRASOUND (EUS) N/A 06/02/2021   Procedure: UPPER ESOPHAGEAL ENDOSCOPIC ULTRASOUND (EUS);  Surgeon: Irving Copas., MD;  Location: [facility];  Service: Gastroenterology;  Laterality: N/A;   VITRECTOMY Left    left eye    Home Medications:  Current Meds  Medication Sig   acetaminophen (TYLENOL) 500 MG tablet Take 500-1,000 mg by mouth every 6 (six) hours as needed (pain.).   amLODipine (NORVASC) 5 MG tablet Take 1 tablet (5 mg total) by mouth daily. D/c 10 mg dose   aspirin EC 81 MG tablet Take 81 mg by mouth  daily. Take while plavix is held per dr end   atorvastatin (LIPITOR) 40 MG tablet Take 1 tablet (40 mg total) by mouth daily.   cyclobenzaprine (FLEXERIL) 10 MG tablet Take 1 tablet (10 mg total) by mouth 2 (two) times daily as needed for muscle spasms.   furosemide (LASIX) 20 MG tablet Take 1 tablet (20 mg total) by mouth as needed (weight gain or edema).   hyoscyamine (LEVBID) 0.375 MG 12 hr tablet TAKE 1 TABLET BY MOUTH EVERY 12 HOURS AS NEEDED   mesalamine (LIALDA) 1.2 g EC tablet Take 2 tablets (2.4 g total) by mouth daily with breakfast.   metoprolol succinate (TOPROL-XL) 100 MG 24 hr tablet TAKE 1 TABLET BY MOUTH EVERY DAY WITH OR IMMEDIATELY FOLLOWING A MEAL   oxyCODONE ER  (XTAMPZA ER) 9 MG C12A Take 9 mg by mouth every 12 (twelve) hours as needed.   potassium chloride (MICRO-K) 10 MEQ CR capsule Take 2 capsules (20 mEq total) by mouth daily. D/c tablet   telmisartan-hydrochlorothiazide (MICARDIS HCT) 80-12.5 MG tablet TAKE 1 TABLET BY MOUTH DAILY. IN AM D/C 40-12.5 MG DOSE   [DISCONTINUED] oxyCODONE ER (XTAMPZA ER) 9 MG C12A Take by mouth 2 (two) times daily.    Allergies:  Allergies  Allergen Reactions   Cymbalta [Duloxetine Hcl] Other (See Comments)    drowsiness    Family History  Problem Relation Age of Onset   Heart disease Mother    Stroke Mother    Breast cancer Mother    Diabetes Sister    Diabetes Brother    Colon cancer Neg Hx    Esophageal cancer Neg Hx    Inflammatory bowel disease Neg Hx    Liver disease Neg Hx    Pancreatic cancer Neg Hx    Rectal cancer Neg Hx    Stomach cancer Neg Hx     Social History:  reports that he quit smoking about 23 years ago. His smoking use included cigarettes. He has a 80.00 pack-year smoking history. He has never used smokeless tobacco. He reports that he does not currently use alcohol. He reports that he does not currently use drugs.  ROS: A complete review of systems was performed.  All systems are negative except for pertinent findings as noted.  Physical Exam:  Vital signs in last 24 hours: Temp:  [97.9 F (36.6 C)] 97.9 F (36.6 C) (07/25 1152) Pulse Rate:  [71] 71 (07/25 1152) Resp:  [16] 16 (07/25 1152) BP: (131)/(69) 131/69 (07/25 1152) SpO2:  [99 %] 99 % (07/25 1152) Weight:  [86.5 kg] 86.5 kg (07/25 1152) Constitutional:  Alert and oriented, No acute distress Cardiovascular: Regular rate and rhythm, No JVD Respiratory: Normal respiratory effort, Lungs clear bilaterally GI: Abdomen is soft, nontender, nondistended, no abdominal masses GU: No CVA tenderness Lymphatic: No lymphadenopathy Neurologic: Grossly intact, no focal deficits Psychiatric: Normal mood and  affect   Laboratory Data:  Recent Labs    11/11/21 1153  HGB 16.3  HCT 48.0    Recent Labs    11/11/21 1153  NA 143  K 3.9  CL 107  GLUCOSE 111*  BUN 17  CREATININE 0.90     Results for orders placed or performed during the hospital encounter of 11/11/21 (from the past 24 hour(s))  I-STAT, chem 8     Status: Abnormal   Collection Time: 11/11/21 11:53 AM  Result Value Ref Range   Sodium 143 135 - 145 mmol/L   Potassium 3.9 3.5 - 5.1 mmol/L  Chloride 107 98 - 111 mmol/L   BUN 17 8 - 23 mg/dL   Creatinine, Ser 0.90 0.61 - 1.24 mg/dL   Glucose, Bld 111 (H) 70 - 99 mg/dL   Calcium, Ion 1.22 1.15 - 1.40 mmol/L   TCO2 23 22 - 32 mmol/L   Hemoglobin 16.3 13.0 - 17.0 g/dL   HCT 48.0 39.0 - 52.0 %   No results found for this or any previous visit (from the past 240 hour(s)).  Renal Function: Recent Labs    11/11/21 1153  CREATININE 0.90   Estimated Creatinine Clearance: 76.8 mL/min (by C-G formula based on SCr of 0.9 mg/dL).  Radiologic Imaging: No results found.  Assessment:  71 yo M with malfunctioning IPP  Plan:  To OR as planned. We reviewed procedure and risks. We discussed I will attempt to remove the reservoir but if I cannot safely do this, I would drain and retain; we reviewed complication rate from retained reservoir. All questions answered   Donald Pore, MD 11/11/2021, 12:26 PM  [facility] Pager: [phone]

## 2021-11-11 NOTE — Transfer of Care (Signed)
Immediate Anesthesia Transfer of Care Note  Patient: Daaron Dimarco  Procedure(s) Performed: Procedure(s) (LRB): REMOVAL OF PENILE PROSTHESIS AND REPLACEMENT OF PENILE PROSTHESIS BOSTON SCIENTIFIC (N/A)  Patient Location: PACU  Anesthesia Type: General  Level of Consciousness: awake, sedated, patient cooperative and responds to stimulation  Airway & Oxygen Therapy: Patient Spontanous Breathing and Patient connected to face mask oxygen  Post-op Assessment: Report given to PACU RN, Post -op Vital signs reviewed and stable and Patient moving all extremities  Post vital signs: Reviewed and stable  Complications: No apparent anesthesia complications

## 2021-11-11 NOTE — Discharge Instructions (Signed)
Penile prosthesis postoperative instructions  Wound:  In most cases your incision will have absorbable sutures that will dissolve within the first 10-20 days. Some will fall out even earlier. Expect some redness as the sutures dissolved but this should occur only around the sutures. If there is generalized redness, especially with increasing pain or swelling, let us know. The scrotum and penis will very likely get "black and blue" as the blood in the tissues spread. Sometimes the whole scrotum will turn colors. The black and blue is followed by a yellow and brown color. In time, all the discoloration will go away. In some cases some firm swelling in the area of the testicle and pump may persist for up to 4-6 weeks after the surgery and is considered normal in most cases.  Drain:   You may be discharged home with a drain in place. If so, you will be taught how to empty it and should keep track of the output. Additionally, you should call the office to arrange for an appointment to have it removed after a few days.   Diet:  You may return to your normal diet within 24 hours following your surgery. You may note some mild nausea and possibly vomiting the first 6-8 hours following surgery. This is usually due to the side effects of anesthesia, and will disappear quite soon. I would suggest clear liquids and a very light meal the first evening following your surgery.  Activity:  Your physical activity should be restricted the first 48 hours. During that time you should remain relatively inactive, moving about only when necessary. During the first 3 weeks following surgery you should avoid lifting any heavy objects (anything greater than 15 pounds), and avoid strenuous exercise. If you work, ask [place] specifically about your restrictions, both for work and home. We will write a note to your employer if needed.  Avoid using your penis until your follow up visit with Dr Cain Sieve, which will typically be around  3-4 weeks following the surgery. Most people are able to start cycling their device after that appointment, and can have intercourse soon thereafter.   You should plan to wear a tight pair of jockey shorts or an athletic supporter for the first 4-5 days, even to sleep. This will keep the scrotum immobilized to some degree and keep the swelling down.The position of your penis will determine what is most comfortable but I strongly urge you to keep the penis in the "up" position (toward your head). You should continue to tuck "up" your penis when possible for the first 3 months following surgery.  Ice packs should be placed on and off over the scrotum for the first 48 hours. Frozen peas or corn in a ZipLock bag can be frozen, used and re-frozen. Fifteen minutes on and 15 minutes off is a reasonable schedule. The ice is a good pain reliever and keeps the swelling down.  Hygiene:  You may shower 48 hours after your surgery. Tub bathing should be restricted until the wound is completely healed, typically around 2-3 weeks.  Medication:  You will be sent home with some type of pain medication. In many cases you will be sent home with a strong anti-inflammatory medication (Celebrex, Meloxicam) and a narcotic pain pill (hydrocodone or oxycodone). You can also supplement these medications with tylenol (acetaminophen). If the pain medication you are sent home with does not control the pain, please notify the office Problems you should report to [place]:  Fever of 101.0 degrees  Fahrenheit or greater. Moderate or severe swelling under the skin incision or involving the scrotum. Drug reaction such as hives, a rash, nausea or vomiting.

## 2021-11-11 NOTE — Anesthesia Preprocedure Evaluation (Addendum)
Anesthesia Evaluation  Patient identified by MRN, date of birth, ID band Patient awake    Reviewed: Allergy & Precautions, NPO status , Patient's Chart, lab work & pertinent test results, reviewed documented beta blocker date and time   History of Anesthesia Complications Negative for: history of anesthetic complications  Airway Mallampati: III  TM Distance: >3 FB Neck ROM: Full    Dental  (+) Dental Advisory Given, Poor Dentition, Chipped   Pulmonary COPD,  COPD inhaler, former smoker,    Pulmonary exam normal        Cardiovascular hypertension, Pt. on medications and Pt. on home beta blockers (-) angina+ CAD, + Cardiac Stents and + Peripheral Vascular Disease  Normal cardiovascular exam   '22 TTE - EF 55 to 60%. There is mild left ventricular hypertrophy. Grade I diastolic dysfunction (impaired relaxation). Trivial MR    Neuro/Psych  Neuromuscular disease negative psych ROS   GI/Hepatic PUD, GERD  Controlled,(+) Hepatitis -, C UC    Endo/Other  negative endocrine ROS  Renal/GU negative Renal ROS    Prostate cancer     Musculoskeletal  (+) Arthritis ,   Abdominal   Peds  Hematology  On Plavix    Anesthesia Other Findings   Reproductive/Obstetrics                            Anesthesia Physical Anesthesia Plan  ASA: 3  Anesthesia Plan: General   Post-op Pain Management: Tylenol PO (pre-op)*   Induction: Intravenous  PONV Risk Score and Plan: 2 and Treatment may vary due to age or medical condition, Ondansetron and Dexamethasone  Airway Management Planned: LMA  Additional Equipment: None  Intra-op Plan:   Post-operative Plan: Extubation in OR  Informed Consent: I have reviewed the patients History and Physical, chart, labs and discussed the procedure including the risks, benefits and alternatives for the proposed anesthesia with the patient or authorized representative  who has indicated his/her understanding and acceptance.     Dental advisory given  Plan Discussed with: CRNA and Anesthesiologist  Anesthesia Plan Comments:        Anesthesia Quick Evaluation

## 2021-11-12 DIAGNOSIS — T83490A Other mechanical complication of penile (implanted) prosthesis, initial encounter: Secondary | ICD-10-CM | POA: Diagnosis not present

## 2021-11-12 MED ORDER — OXYCODONE HCL 5 MG PO TABS: 5.0000 mg | ORAL_TABLET | Freq: Four times a day (QID) | ORAL | 0 refills | Status: DC | PRN

## 2021-11-12 MED ORDER — POLYETHYLENE GLYCOL 3350 17 G PO PACK: 17.0000 g | PACK | Freq: Every day | ORAL | 0 refills | Status: DC | PRN

## 2021-11-12 MED ORDER — OXYCODONE HCL 5 MG PO TABS
ORAL_TABLET | ORAL | Status: AC
  Filled 2021-11-12: qty 1

## 2021-11-12 MED ORDER — ACETAMINOPHEN 325 MG PO TABS
ORAL_TABLET | ORAL | Status: AC
  Filled 2021-11-12: qty 2

## 2021-11-12 MED ORDER — CLOPIDOGREL BISULFATE 75 MG PO TABS: 75.0000 mg | ORAL_TABLET | Freq: Every day | ORAL | 3 refills | Status: DC

## 2021-11-12 MED ORDER — SULFAMETHOXAZOLE-TRIMETHOPRIM 800-160 MG PO TABS: 1.0000 | ORAL_TABLET | Freq: Two times a day (BID) | ORAL | 0 refills | Status: DC

## 2021-11-12 MED ORDER — BACITRACIN-NEOMYCIN-POLYMYXIN OINTMENT TUBE
TOPICAL_OINTMENT | CUTANEOUS | Status: AC
  Filled 2021-11-12: qty 14.17

## 2021-11-12 MED ORDER — VANCOMYCIN HCL IN DEXTROSE 1-5 GM/200ML-% IV SOLN
INTRAVENOUS | Status: AC
  Filled 2021-11-12: qty 200

## 2021-11-12 MED ORDER — CELECOXIB 100 MG PO CAPS: 100.0000 mg | ORAL_CAPSULE | Freq: Two times a day (BID) | ORAL | 0 refills | Status: DC

## 2021-11-12 NOTE — Anesthesia Postprocedure Evaluation (Signed)
Anesthesia Post Note  Patient: Erik Nicholson  Procedure(s) Performed: REMOVAL OF PENILE PROSTHESIS AND REPLACEMENT OF PENILE PROSTHESIS BOSTON SCIENTIFIC (Penis)     Patient location during evaluation: PACU Anesthesia Type: General Level of consciousness: awake and alert and oriented Pain management: pain level controlled Vital Signs Assessment: post-procedure vital signs reviewed and stable Respiratory status: spontaneous breathing, nonlabored ventilation and respiratory function stable Cardiovascular status: blood pressure returned to baseline and stable Postop Assessment: no apparent nausea or vomiting Anesthetic complications: no   No notable events documented.                 Quamel Fitzmaurice A.

## 2021-11-12 NOTE — Discharge Summary (Signed)
[facility] Discharge Summary  Admit date: 11/11/2021  Discharge date and time: 11/12/21   Discharge to: Home  Discharge Service: Urology  Discharge Attending Physician:  Dr. Cain Sieve  Discharge  Diagnoses: S/P insertion of penile implant  Secondary Diagnosis: Principal Problem:   S/P insertion of penile implant   OR Procedures: Procedure(s): REMOVAL OF PENILE PROSTHESIS AND REPLACEMENT OF PENILE PROSTHESIS BOSTON SCIENTIFIC 11/11/2021   Ancillary Procedures: None   Discharge Day Services: The patient was seen and examined by the Urology team both in the morning and immediately prior to discharge.  Vital signs and laboratory values were stable and within normal limits.  The physical exam was benign and unchanged and all surgical wounds were examined.  Discharge instructions were explained and all questions answered.  Subjective  No acute events overnight. Pain Controlled. No fever or chills.  Objective Patient Vitals for the past 8 hrs:  BP Temp Pulse Resp SpO2  11/12/21 0615 (!) 149/95 98.4 F (36.9 C) 81 16 100 %  11/12/21 0230 140/79 -- -- -- --  11/12/21 0215 (!) 189/89 97.6 F (36.4 C) 94 16 99 %   No intake/output data recorded.  General Appearance:        No acute distress Lungs:                       Normal work of breathing on room air Heart:                                Regular rate and rhythm Abdomen:                         Soft, non-tender, non-distended GU:        Foley draining clear yellow urine. Incision well appearing, properly dressed Extremities:                      Warm and well perfused   Hospital Course:  The patient underwent penile prosthesis removal and replacmend on 11/11/2021.  The patient tolerated the procedure well, was extubated in the OR, and afterwards was taken to the PACU for routine post-surgical care. When stable the patient was transferred to the floor.   The patient did well postoperatively.  The patient's diet was slowly  advanced and at the time of discharge was tolerating a regular diet.  The foley and scrotal JP drain were removed post op day 1. The patient was discharged home 1 Day Post-Op, at which point was tolerating a regular solid diet, was able to void spontaneously, have adequate pain control with P.O. pain medication, and could ambulate without difficulty. The patient will follow up with [place] for post op check.   Condition at Discharge: Improved  Discharge Medications:  Allergies as of 11/12/2021       Reactions   Cymbalta [duloxetine Hcl] Other (See Comments)   drowsiness        Medication List     TAKE these medications    acetaminophen 500 MG tablet Commonly known as: TYLENOL Take 500-1,000 mg by mouth every 6 (six) hours as needed (pain.).   albuterol 108 (90 Base) MCG/ACT inhaler Commonly known as: VENTOLIN HFA Inhale 2 puffs into the lungs every 6 (six) hours as needed.   amLODipine 5 MG tablet Commonly known as: NORVASC Take 1 tablet (5 mg total) by mouth daily. D/c 10  mg dose   aspirin EC 81 MG tablet Take 81 mg by mouth daily. Take while plavix is held per dr end   atorvastatin 40 MG tablet Commonly known as: LIPITOR Take 1 tablet (40 mg total) by mouth daily.   celecoxib 100 MG capsule Commonly known as: CELEBREX Take 1 capsule (100 mg total) by mouth 2 (two) times daily.   clopidogrel 75 MG tablet Commonly known as: PLAVIX Take 1 tablet (75 mg total) by mouth daily. Start taking on: November 16, 2021 What changed: These instructions start on November 16, 2021. If you are unsure what to do until then, ask your doctor or other care provider.   cyclobenzaprine 10 MG tablet Commonly known as: FLEXERIL Take 1 tablet (10 mg total) by mouth 2 (two) times daily as needed for muscle spasms.   furosemide 20 MG tablet Commonly known as: LASIX Take 1 tablet (20 mg total) by mouth as needed (weight gain or edema).   hyoscyamine 0.375 MG 12 hr tablet Commonly known as:  LEVBID TAKE 1 TABLET BY MOUTH EVERY 12 HOURS AS NEEDED   lidocaine 5 % Commonly known as: LIDODERM Place 1 patch onto the skin daily as needed (pain). Remove patch after 12 hours.   mesalamine 1.2 g EC tablet Commonly known as: LIALDA Take 2 tablets (2.4 g total) by mouth daily with breakfast.   metoprolol succinate 100 MG 24 hr tablet Commonly known as: TOPROL-XL TAKE 1 TABLET BY MOUTH EVERY DAY WITH OR IMMEDIATELY FOLLOWING A MEAL   nitroGLYCERIN 0.4 MG SL tablet Commonly known as: NITROSTAT TAKE 1 TABLET BY MOUTH SUBLINGUALLY EVERY 5 MINS AS NEEDED FOR CHEST PAIN. IF >1 NEEDED, CALL 911   oxyCODONE 5 MG immediate release tablet Commonly known as: Oxy IR/ROXICODONE Take 1 tablet (5 mg total) by mouth every 6 (six) hours as needed for severe pain or breakthrough pain (do not take extended release oxycoodone while on this medication).   polyethylene glycol 17 g packet Commonly known as: MiraLax Take 17 g by mouth daily as needed for moderate constipation, mild constipation or severe constipation.   potassium chloride 10 MEQ CR capsule Commonly known as: MICRO-K Take 2 capsules (20 mEq total) by mouth daily. D/c tablet   pregabalin 50 MG capsule Commonly known as: LYRICA Take 50 mg by mouth at bedtime.   sulfamethoxazole-trimethoprim 800-160 MG tablet Commonly known as: BACTRIM DS Take 1 tablet by mouth 2 (two) times daily.   telmisartan-hydrochlorothiazide 80-12.5 MG tablet Commonly known as: MICARDIS HCT TAKE 1 TABLET BY MOUTH DAILY. IN AM D/C 40-12.5 MG DOSE   Xtampza ER 9 MG C12a Generic drug: oxyCODONE ER Take 9 mg by mouth every 12 (twelve) hours as needed.               Discharge Care Instructions  (From admission, onward)           Start     Ordered   11/12/21 0000  If the dressing is still on your incision site when you go home, remove it on the third day after your surgery date. Remove dressing if it begins to fall off, or if it is dirty or  damaged before the third day.        11/12/21 9509

## 2021-11-13 ENCOUNTER — Encounter (HOSPITAL_BASED_OUTPATIENT_CLINIC_OR_DEPARTMENT_OTHER): Payer: Self-pay | Admitting: Urology

## 2021-11-29 ENCOUNTER — Other Ambulatory Visit: Payer: Self-pay | Admitting: Internal Medicine

## 2021-11-29 DIAGNOSIS — J9801 Acute bronchospasm: Secondary | ICD-10-CM

## 2021-11-29 DIAGNOSIS — U071 COVID-19: Secondary | ICD-10-CM

## 2021-12-03 ENCOUNTER — Other Ambulatory Visit: Payer: Self-pay | Admitting: Internal Medicine

## 2021-12-03 ENCOUNTER — Other Ambulatory Visit: Payer: Self-pay | Admitting: Physician Assistant

## 2021-12-03 DIAGNOSIS — M5136 Other intervertebral disc degeneration, lumbar region: Secondary | ICD-10-CM

## 2021-12-03 DIAGNOSIS — G894 Chronic pain syndrome: Secondary | ICD-10-CM

## 2021-12-11 ENCOUNTER — Other Ambulatory Visit: Payer: Self-pay | Admitting: Internal Medicine

## 2022-01-29 ENCOUNTER — Other Ambulatory Visit: Payer: Self-pay | Admitting: Internal Medicine

## 2022-02-06 ENCOUNTER — Other Ambulatory Visit: Payer: Self-pay | Admitting: Internal Medicine

## 2022-02-24 ENCOUNTER — Other Ambulatory Visit: Payer: Self-pay | Admitting: Family

## 2022-02-24 DIAGNOSIS — J9801 Acute bronchospasm: Secondary | ICD-10-CM

## 2022-02-24 DIAGNOSIS — U071 COVID-19: Secondary | ICD-10-CM

## 2022-04-07 ENCOUNTER — Telehealth: Payer: Self-pay

## 2022-04-07 DIAGNOSIS — Z8619 Personal history of other infectious and parasitic diseases: Secondary | ICD-10-CM

## 2022-04-07 NOTE — Telephone Encounter (Signed)
The pt has agreed to [place] order placed and sent to the schedulers

## 2022-04-07 NOTE — Telephone Encounter (Signed)
-----   Message from Timothy Lasso, RN sent at 10/06/2021 10:36 AM EDT ----- 1) to maintain every 6 month [place] screenings for at least the next 1 to 2 years

## 2022-04-17 ENCOUNTER — Ambulatory Visit (HOSPITAL_COMMUNITY)
Admission: RE | Admit: 2022-04-17 | Discharge: 2022-04-17 | Disposition: A | Discharge status: Home / Self Care | Payer: Medicare Other | Source: Ambulatory Visit | Attending: Gastroenterology | Admitting: Gastroenterology

## 2022-04-17 DIAGNOSIS — Z8619 Personal history of other infectious and parasitic diseases: Secondary | ICD-10-CM | POA: Insufficient documentation

## 2022-04-21 NOTE — Progress Notes (Signed)
Cardiology Office Note    Date:  04/22/2022   ID:  Quantavius Reisinger, DOB [birth year], MRN [id number]  PCP:  Dana Allan, MD  Cardiologist:  Yvonne Kendall, MD  Electrophysiologist:  None   Chief Complaint: Follow up  History of Present Illness:   Erik Nicholson is a 72 y.o. male with history of CAD status post multiple PCI's, HFpEF, PVD status post bilateral lower extremity stenting followed by vascular surgery, ulcerative colitis, COVID infection in 10/2020, hepatitis C, probable COPD, HTN, HLD, prostate cancer, and BPH who presents for follow-up of CAD and HFpEF.   With regards to his CAD and PVD, notes indicate he has a history of 7 coronary and 3 lower extremity stents while living in the [place] area.  Most recent LHC in 03/2020 demonstrated severe single-vessel CAD with up to 75% ISR of the mid/distal RCA stent as well as 60 to 70% ostial stenosis of RPDA and RPL 1.  There was mild to moderate noncritical left coronary disease with most severe lesion located in a small to moderate caliber D1 branch that was jailed by prior LAD stent with up to 70% stenosis.  Normal LV systolic function with mildly elevated filling pressures.  He underwent PTCA of the mid/distal RCA with reduction in stenosis from 75% to 20% with TIMI-3 flow.  Stent placement was not attempted due to multiple layers of stent already present and incomplete expansion of a 3.5 mm noncompliant balloon at 22 ATM.  With percutaneous intervention he did note improvement in chest pain, though did continue to have significant exertional dyspnea.  PFTs showed probable small obstructive airway disease with bronchodilator response, for which he is followed by pulmonology.  Echo from 05/14/2020 showed an EF of 55 to 60%, no regional wall motion abnormalities, mild LVH, grade 1 diastolic dysfunction, normal RV systolic function and ventricular cavity size, trivial mitral regurgitation, and an estimated right atrial pressure of 3 mmHg.    PFTs in 05/2020 showed evidence of obstructive airway disease.   He was seen in the office on 11/18/2020 and continued to note chronic exertional dyspnea with activity such as climbing stairs or walking at a brisk pace.  Dyspnea improved with previously prescribed prednisone.  He was euvolemic.  Given symptoms, he underwent Lexiscan MPI on 11/25/2020 which showed no significant ischemia and was a low risk study with an EF of 55 to 65%.   Zio patch in 02/2021 showed a predominant rhythm of sinus with an average rate of 77 bpm (range 52 to 118 bpm in sinus), a single 4 beat run of NSVT occurred with a maximal rate of 193 bpm, 6 atrial runs lasting up to 7 beats with a maximal rate of 139 bpm, rare PACs and PVCs, and no sustained arrhythmias or prolonged pauses.  Patient triggered events corresponded to sinus rhythm and PACs.  He was last seen in the office in 09/2021 and was without symptoms of angina or decompensation.  He had noted some heaviness in his right lower extremity that has been present for many years.  He also noted mild lower extremity swelling that was stable and did not improve much with prior escalation of furosemide to daily dosing.  His shortness of breath was minimal given correct Spiriva use on a daily basis.  His furosemide was changed back to as needed dosing.  He was felt to be acceptable risk for penile prosthesis revision from a cardiac perspective without further testing or intervention.  He comes in  today and is without symptoms of angina or decompensation.  He notes chronic stable exertional dyspnea.  He reports Spiriva did not help his breathing and is no longer taking this medication.  He would like a refill of his albuterol while he is waiting to get in with his new PCP.  No palpitations, dizziness, presyncope, or syncope.  No falls or symptoms concerning for bleeding.  Currently taking furosemide 20 mg on most days, though does occasionally forget a day.  Weight remains stable.  No  significant lower extremity swelling, abdominal distention, orthopnea, PND, or early satiety.   Labs independently reviewed: 08/2021 - Hgb 13.1, PLT 214, potassium 3.7, BUN 20, serum creatinine 1.1, albumin 4.6, AST/ALT normal 06/2021 - direct LDL 69, TC 143, TG 216, HDL 44, A1c 5.8 04/2021 - TSH normal  Past Medical History:  Diagnosis Date   Arthritis    both hands   BPH (benign prostatic hyperplasia)    CAD (coronary artery disease)    x 7 cardiac stents   Chronic back pain    L4/5 with chronic right leg pain    COVID-19    10/20/20   Floater, vitreous, left    Hx of hepatitis C    treated with Harvoni in 2014   Hyperlipidemia    Hypertension    Neuromuscular disorder (HCC)    nerve damage in back   Neuropathy    tingling in toes @ times   Prostate cancer (HCC)    fall 2022, radiation therapy done dr Kathrynn Running   PVD (peripheral vascular disease) (HCC)    2 stents right leg and 1 in left    Ulcerative colitis (HCC)    in remission since fall of 2022    Past Surgical History:  Procedure Laterality Date   back surgery     x 2 lumbar last 09/2016 in [place]   BIOPSY  06/02/2021   Procedure: BIOPSY;  Surgeon: Lemar Lofty., MD;  Location: [facility];  Service: Gastroenterology;;  EGD and COLON   CARDIAC CATHETERIZATION     CATARACT EXTRACTION, BILATERAL     COLONOSCOPY     last 09/2018 in [place]   COLONOSCOPY WITH PROPOFOL N/A 06/02/2021   Procedure: COLONOSCOPY WITH PROPOFOL;  Surgeon: Lemar Lofty., MD;  Location: [facility];  Service: Gastroenterology;  Laterality: N/A;   CORONARY BALLOON ANGIOPLASTY N/A 03/21/2020   Procedure: CORONARY BALLOON ANGIOPLASTY;  Surgeon: Yvonne Kendall, MD;  Location: [facility];  Service: Cardiovascular;  Laterality: N/A;   ESOPHAGOGASTRODUODENOSCOPY (EGD) WITH PROPOFOL N/A 06/02/2021   Procedure: ESOPHAGOGASTRODUODENOSCOPY (EGD) WITH PROPOFOL;  Surgeon: Meridee Score Netty Starring., MD;   Location: [facility];  Service: Gastroenterology;  Laterality: N/A;   HERNIA REPAIR     right    INTRAVASCULAR PRESSURE WIRE/FFR STUDY N/A 03/21/2020   Procedure: INTRAVASCULAR PRESSURE WIRE/FFR STUDY;  Surgeon: Yvonne Kendall, MD;  Location: [facility];  Service: Cardiovascular;  Laterality: N/A;   LEFT HEART CATH AND CORONARY ANGIOGRAPHY N/A 03/21/2020   Procedure: LEFT HEART CATH AND CORONARY ANGIOGRAPHY;  Surgeon: Yvonne Kendall, MD;  Location: [facility];  Service: Cardiovascular;  Laterality: N/A;   PENILE PROSTHESIS IMPLANT  1995   PERCUTANEOUS CORONARY STENT INTERVENTION (PCI-S)     x 7-8 heart   PERIPHERAL ARTERIAL STENT GRAFT     PROSTATE BIOPSY     pvd     with stenting x 2 right leg below knee and 1 left thigh    REMOVAL OF  PENILE PROSTHESIS N/A 11/11/2021   Procedure: REMOVAL OF PENILE PROSTHESIS AND REPLACEMENT OF PENILE PROSTHESIS [employer];  Surgeon: Despina Arias, MD;  Location: [facility];  Service: Urology;  Laterality: N/A;   SHOULDER ARTHROSCOPY Bilateral    TRIGGER FINGER RELEASE Left    UPPER ESOPHAGEAL ENDOSCOPIC ULTRASOUND (EUS) N/A 06/02/2021   Procedure: UPPER ESOPHAGEAL ENDOSCOPIC ULTRASOUND (EUS);  Surgeon: Lemar Lofty., MD;  Location: [facility];  Service: Gastroenterology;  Laterality: N/A;   VITRECTOMY Left    left eye    Current Medications: Current Meds  Medication Sig   acetaminophen (TYLENOL) 500 MG tablet Take 500-1,000 mg by mouth every 6 (six) hours as needed (pain.).   albuterol (VENTOLIN HFA) 108 (90 Base) MCG/ACT inhaler INHALE 1-2 PUFFS BY MOUTH EVERY 6 HOURS AS NEEDED FOR WHEEZE OR SHORTNESS OF BREATH   amLODipine (NORVASC) 5 MG tablet Take 1 tablet (5 mg total) by mouth daily. D/c 10 mg dose   aspirin EC 81 MG tablet Take 81 mg by mouth daily. Take while plavix is held per dr end   atorvastatin (LIPITOR) 40 MG tablet TAKE 1 TABLET BY MOUTH EVERY DAY   clopidogrel (PLAVIX) 75 MG  tablet Take 1 tablet (75 mg total) by mouth daily.   cyclobenzaprine (FLEXERIL) 10 MG tablet Take 1 tablet (10 mg total) by mouth 2 (two) times daily as needed for muscle spasms.   cyclobenzaprine (FLEXERIL) 5 MG tablet TAKE 1-2 TABLETS (5-10 MG TOTAL) BY MOUTH AT BEDTIME AS NEEDED FOR MUSCLE SPASMS.   cycloSPORINE (RESTASIS) 0.05 % ophthalmic emulsion Place 1 drop into both eyes 2 (two) times daily.   furosemide (LASIX) 20 MG tablet TAKE 1 TABLET (20 MG TOTAL) BY MOUTH AS DIRECTED. TAKE 1 TABLET DAILY FOR ONE WEEK. (Patient taking differently: Take 20 mg by mouth daily.)   hyoscyamine (LEVBID) 0.375 MG 12 hr tablet TAKE 1 TABLET BY MOUTH EVERY 12 HOURS AS NEEDED   lidocaine (LIDODERM) 5 % Place 1 patch onto the skin daily as needed (pain). Remove patch after 12 hours.   meloxicam (MOBIC) 7.5 MG tablet Take 7.5 mg by mouth as needed.   mesalamine (LIALDA) 1.2 g EC tablet TAKE 2 TABLETS BY MOUTH DAILY WITH BREAKFAST.   metoprolol succinate (TOPROL-XL) 100 MG 24 hr tablet TAKE 1 TABLET BY MOUTH EVERY DAY WITH OR IMMEDIATELY FOLLOWING A MEAL   nitroGLYCERIN (NITROSTAT) 0.4 MG SL tablet TAKE 1 TABLET BY MOUTH SUBLINGUALLY EVERY 5 MINS AS NEEDED FOR CHEST PAIN. IF >1 NEEDED, CALL 911   oxyCODONE (OXY IR/ROXICODONE) 5 MG immediate release tablet Take 1 tablet (5 mg total) by mouth every 6 (six) hours as needed for severe pain or breakthrough pain (do not take extended release oxycoodone while on this medication).   oxyCODONE (ROXICODONE) 5 MG immediate release tablet Take 1 tablet (5 mg total) by mouth every 6 (six) hours as needed for severe pain or breakthrough pain.   oxyCODONE ER (XTAMPZA ER) 9 MG C12A Take 9 mg by mouth every 12 (twelve) hours as needed.   polyethylene glycol (MIRALAX) 17 g packet Take 17 g by mouth daily as needed for moderate constipation, mild constipation or severe constipation.   potassium chloride (MICRO-K) 10 MEQ CR capsule Take 2 capsules (20 mEq total) by mouth daily. D/c  tablet   pregabalin (LYRICA) 50 MG capsule Take 50 mg by mouth at bedtime.   telmisartan-hydrochlorothiazide (MICARDIS HCT) 80-12.5 MG tablet TAKE 1 TABLET BY MOUTH DAILY. IN AM D/C  40-12.5 MG DOSE    Allergies:   Cymbalta [duloxetine hcl]   Social History   Socioeconomic History   Marital status: Married    Spouse name: Not on file   Number of children: 2   Years of education: Not on file   Highest education level: Not on file  Occupational History   Not on file  Tobacco Use   Smoking status: Former    Packs/day: 2.00    Years: 40.00    Total pack years: 80.00    Types: Cigarettes    Quit date: 04/20/1998    Years since quitting: 24.0   Smokeless tobacco: Never   Tobacco comments:    quit in 2000s   Vaping Use   Vaping Use: Never used  Substance and Sexual Activity   Alcohol use: Not Currently   Drug use: Not Currently   Sexual activity: Yes  Other Topics Concern   Not on file  Social History Narrative   Moved from [place] in 10/2018   2 sons in 27s as of 11/24/2018    Married wife is DPR Britta Mccreedy x 45 years as of 04/2019   Social Determinants of Health   Financial Resource Strain: Low Risk  (07/09/2020)   Overall Financial Resource Strain (CARDIA)    Difficulty of Paying Living Expenses: Not hard at all  Food Insecurity: No Food Insecurity (07/09/2020)   Hunger Vital Sign    Worried About Running Out of Food in the Last Year: Never true    Ran Out of Food in the Last Year: Never true  Transportation Needs: No Transportation Needs (07/09/2020)   PRAPARE - Administrator, Civil Service (Medical): No    Lack of Transportation (Non-Medical): No  Physical Activity: Not on file  Stress: No Stress Concern Present (07/09/2020)   [employer] of Occupational Health - Occupational Stress Questionnaire    Feeling of Stress : Not at all  Social Connections: Unknown (07/09/2020)   Social Connection and Isolation Panel [NHANES]    Frequency of  Communication with Friends and Family: Not on file    Frequency of Social Gatherings with Friends and Family: Not on file    Attends Religious Services: Not on file    Active Member of Clubs or Organizations: Not on file    Attends Banker Meetings: Not on file    Marital Status: Married     Family History:  The patient's family history includes Breast cancer in his mother; Diabetes in his brother and sister; Heart disease in his mother; Stroke in his mother. There is no history of Colon cancer, Esophageal cancer, Inflammatory bowel disease, Liver disease, Pancreatic cancer, Rectal cancer, or Stomach cancer.  ROS:   12-point review of systems is negative unless otherwise noted in the HPI.   EKGs/Labs/Other Studies Reviewed:    Studies reviewed were summarized above. The additional studies were reviewed today:  Lexiscan MPI 11/2020: ST segment elevation was noted during stress. No T wave inversion was noted during stress. The study is normal. This is a low risk study. The left ventricular ejection fraction is normal (55-65%). CT attenuation images showed significant coronary calcifications and mild aortic calcifications. __________   2D echo 05/14/2020: 1. Left ventricular ejection fraction, by estimation, is 55 to 60%. The  left ventricle has normal function. The left ventricle has no regional  wall motion abnormalities. There is mild left ventricular hypertrophy.  Left ventricular diastolic parameters  are consistent with Grade I diastolic  dysfunction (impaired relaxation).  The average left ventricular global longitudinal strain is -16.3 %.   2. Right ventricular systolic function is normal. The right ventricular  size is normal.   3. The mitral valve is grossly normal. Trivial mitral valve  regurgitation. No evidence of mitral stenosis.   4. The aortic valve was not well visualized. Aortic valve regurgitation  is not visualized. No aortic stenosis is present.    5. The inferior vena cava is normal in size with greater than 50%  respiratory variability, suggesting right atrial pressure of 3 mmHg. __________   LHC 03/21/2020: Conclusions: Severe single-vessel coronary artery disease with up to 75% in-stent restenosis of the mid/distal RCA (FFR 0.70), as well as 60-70% ostial stenoses of rPDA and rPL1. Mild to moderate, non-critical left coronary artery disease.  The most severe lesion is a small to moderate caliber D1 branch that is jailed by LAD stent with up to 70% stenosis. Normal left ventricular systolic function with mildly elevated filling pressure. Successful PTCA of mid/distal RCA with reduction in stenosis from 75% to 20% with TIMI-3 flow.  Stent placement not attempted due to multiple layers of stent already present and incomplete expansion of 3.5 mm non-compliant balloon at 22 atm.   Recommendations: Continue aggressive secondary prevention. Indefinite dual antiplatelet therapy with aspirin and clopidogrel; discontinuation of aspirin could be considered after 6 months of therapy. Anticipate same-day discharge if no post-catheterization complication occur. __________   Eugenie Birks MPI 03/07/2019: Normal pharmacologic myocardial perfusion stress test without significant ischemia or scar. The left ventricular ejection fraction is normal by visual estimation and Siemens calculation (56%). Dense coronary artery calcifications and/or prior coronary stents are noted on the attenuation correction CT. This is a low risk study. __________   2D echo 03/02/2019: 1. Left ventricular ejection fraction, by visual estimation, is 60 to  65%. The left ventricle has normal function. There is no left ventricular  hypertrophy.   2. Left ventricular diastolic parameters are consistent with Grade I  diastolic dysfunction (impaired relaxation).   3. Global right ventricle has normal systolic function.The right  ventricular size is normal. No increase in right  ventricular wall  thickness.   4. Left atrial size was normal.   5. Normal pulmonary artery systolic pressure. __________   [facility] 02/25/2012 [facility]): Patent stents in the proximal and mid RCA, 40% mid RCA stenosis, normal left heart pressures, normal LV systolic function   EKG:  EKG is ordered today.  The EKG ordered today demonstrates NSR, 65 bpm, prior inferior infarct, poor R wave progression along the precordial leads, no acute ST-T changes  Recent Labs: 04/30/2021: Pro B Natriuretic peptide (BNP) 29.0; TSH 1.01 09/16/2021: ALT 28; Platelets 214.0 11/11/2021: BUN 17; Creatinine, Ser 0.90; Hemoglobin 16.3; Potassium 3.9; Sodium 143  Recent Lipid Panel    Component Value Date/Time   CHOL 143 07/15/2021 1101   TRIG 216.0 (H) 07/15/2021 1101   HDL 44.40 07/15/2021 1101   CHOLHDL 3 07/15/2021 1101   VLDL 43.2 (H) 07/15/2021 1101   LDLCALC 45 01/30/2020 0803   LDLDIRECT 69.0 07/15/2021 1101    PHYSICAL EXAM:    VS:  BP 130/80 (BP Location: Left Arm, Patient Position: Sitting, Cuff Size: Normal)   Pulse 65   Ht 5\' 7"  (1.702 m)   Wt 194 lb 8 oz (88.2 kg)   SpO2 98%   BMI 30.46 kg/m   BMI: Body mass index is 30.46 kg/m.  Physical Exam Vitals reviewed.  Constitutional:  Appearance: He is well-developed.  HENT:     Head: Normocephalic and atraumatic.  Eyes:     General:        Right eye: No discharge.        Left eye: No discharge.  Neck:     Vascular: No JVD.  Cardiovascular:     Rate and Rhythm: Normal rate and regular rhythm.     Heart sounds: Normal heart sounds, S1 normal and S2 normal. Heart sounds not distant. No midsystolic click and no opening snap. No murmur heard.    No friction rub.  Pulmonary:     Effort: Pulmonary effort is normal. No respiratory distress.     Breath sounds: Normal breath sounds. No decreased breath sounds, wheezing or rales.  Chest:     Chest wall: No tenderness.  Abdominal:     General: There is no  distension.  Musculoskeletal:     Cervical back: Normal range of motion.     Right lower leg: No edema.     Left lower leg: No edema.  Skin:    General: Skin is warm and dry.     Nails: There is no clubbing.  Neurological:     Mental Status: He is alert and oriented to person, place, and time.  Psychiatric:        Speech: Speech normal.        Behavior: Behavior normal.        Thought Content: Thought content normal.        Judgment: Judgment normal.     Wt Readings from Last 3 Encounters:  04/22/22 194 lb 8 oz (88.2 kg)  11/11/21 190 lb 11.2 oz (86.5 kg)  10/16/21 193 lb (87.5 kg)     ASSESSMENT & PLAN:   CAD involving the native coronary arteries without angina with chronic dyspnea: He is doing well and is without symptoms of angina or decompensation.  He notes a longstanding history of exertional dyspnea that is largely stable and typically improved with albuterol.  We will provide him a refill of albuterol while he is awaiting appointment with his new PCP.  Continue risk factor modification and current medical therapy including aspirin, amlodipine, Plavix, metoprolol, and atorvastatin.  No indication for ischemic testing at this time.  Check CBC.  HFpEF: He appears euvolemic and well compensated with NYHA class II-III symptoms.  Update echo to ensure stable findings.  He has been taking furosemide 20 mg on most days rather than as needed.  In this setting, we will check a BMP.  With prior history of hypotension we will defer addition of SGLT2 inhibitor/MRA.  HTN: Blood pressure is well-controlled in the office today.  Continue current therapy.  HLD: LDL 69 in 06/2021 with normal AST/ALT in 08/2021.  He remains on atorvastatin.  PAD: No symptoms concerning for claudication.  Followed by vascular surgery.  He remains on atorvastatin and aspirin.   Disposition: F/u with Dr. Okey Dupre or an APP in 6 months.   Medication Adjustments/Labs and Tests Ordered: Current medicines are  reviewed at length with the patient today.  Concerns regarding medicines are outlined above. Medication changes, Labs and Tests ordered today are summarized above and listed in the Patient Instructions accessible in Encounters.   Signed, Eula Listen, PA-C 04/22/2022 2:20 PM     [facility] [address] [place], [place] [address] [phone]

## 2022-04-22 ENCOUNTER — Ambulatory Visit: Payer: Medicare Other | Attending: Physician Assistant | Admitting: Physician Assistant

## 2022-04-22 ENCOUNTER — Other Ambulatory Visit: Payer: Self-pay | Admitting: Family Medicine

## 2022-04-22 ENCOUNTER — Encounter: Payer: Self-pay | Admitting: Physician Assistant

## 2022-04-22 ENCOUNTER — Other Ambulatory Visit
Admission: RE | Admit: 2022-04-22 | Discharge: 2022-04-22 | Disposition: A | Discharge status: Home / Self Care | Payer: Medicare Other | Source: Ambulatory Visit | Attending: Physician Assistant | Admitting: Physician Assistant

## 2022-04-22 VITALS — BP 130/80 | HR 65 | Ht 67.0 in | Wt 194.5 lb

## 2022-04-22 DIAGNOSIS — E785 Hyperlipidemia, unspecified: Secondary | ICD-10-CM

## 2022-04-22 DIAGNOSIS — I739 Peripheral vascular disease, unspecified: Secondary | ICD-10-CM | POA: Diagnosis present

## 2022-04-22 DIAGNOSIS — I5032 Chronic diastolic (congestive) heart failure: Secondary | ICD-10-CM | POA: Diagnosis present

## 2022-04-22 DIAGNOSIS — I2510 Atherosclerotic heart disease of native coronary artery without angina pectoris: Secondary | ICD-10-CM | POA: Diagnosis present

## 2022-04-22 DIAGNOSIS — R0602 Shortness of breath: Secondary | ICD-10-CM | POA: Insufficient documentation

## 2022-04-22 DIAGNOSIS — I10 Essential (primary) hypertension: Secondary | ICD-10-CM | POA: Diagnosis present

## 2022-04-22 DIAGNOSIS — U071 COVID-19: Secondary | ICD-10-CM | POA: Diagnosis present

## 2022-04-22 DIAGNOSIS — J9801 Acute bronchospasm: Secondary | ICD-10-CM | POA: Insufficient documentation

## 2022-04-22 LAB — BASIC METABOLIC PANEL
Anion gap: 8 (ref 5–15)
BUN: 16 mg/dL (ref 8–23)
CO2: 27 mmol/L (ref 22–32)
Calcium: 9.5 mg/dL (ref 8.9–10.3)
Chloride: 103 mmol/L (ref 98–111)
Creatinine, Ser: 1.03 mg/dL (ref 0.61–1.24)
GFR, Estimated: >60 mL/min (ref >60)
Glucose, Bld: 126 mg/dL — ABNORMAL HIGH (ref 70–99)
Potassium: 3.7 mmol/L (ref 3.5–5.1)
Sodium: 138 mmol/L (ref 135–145)

## 2022-04-22 LAB — CBC
HCT: 37.9 % — ABNORMAL LOW (ref 39.0–52.0)
Hemoglobin: 12.4 g/dL — ABNORMAL LOW (ref 13.0–17.0)
MCH: 32.7 pg (ref 26.0–34.0)
MCHC: 32.7 g/dL (ref 30.0–36.0)
MCV: 100 fL (ref 80.0–100.0)
Platelets: 223 10*3/uL (ref 150–400)
RBC: 3.79 MIL/uL — ABNORMAL LOW (ref 4.22–5.81)
RDW: 12.8 % (ref 11.5–15.5)
WBC: 5.1 10*3/uL (ref 4.0–10.5)
nRBC: 0 % (ref 0.0–0.2)

## 2022-04-22 NOTE — Patient Instructions (Signed)
Medication Instructions:  No changes at this time.  *If you need a refill on your cardiac medications before your next appointment, please call your pharmacy*   Lab Work: [place] today over at [facility] entrance and check in at registration.   If you have labs (blood work) drawn today and your tests are completely normal, you will receive your results only by: [place] (if you have MyChart) OR A paper copy in the mail If you have any lab test that is abnormal or we need to change your treatment, we will call you to review the results.   Testing/Procedures: Your physician has requested that you have an echocardiogram. Echocardiography is a painless test that uses sound waves to create images of your heart. It provides your doctor with information about the size and shape of your heart and how well your heart's chambers and valves are working. This procedure takes approximately one hour. There are no restrictions for this procedure. Please do NOT wear cologne, perfume, aftershave, or lotions (deodorant is allowed). Please arrive 15 minutes prior to your appointment time.    Follow-Up: At [facility], you and your health needs are our priority.  As part of our continuing mission to provide you with exceptional heart care, we have created designated Provider Care Teams.  These Care Teams include your primary Cardiologist (physician) and Advanced Practice Providers (APPs -  Physician Assistants and Nurse Practitioners) who all work together to provide you with the care you need, when you need it.   Your next appointment:   6 month(s)  The format for your next appointment:   In Person  Provider:   Nelva Bush, MD or Christell Faith, PA-C        Important Information About Sugar

## 2022-04-24 MED ORDER — ALBUTEROL SULFATE HFA 108 (90 BASE) MCG/ACT IN AERS: INHALATION_SPRAY | RESPIRATORY_TRACT | 0 refills | Status: DC

## 2022-04-24 NOTE — Addendum Note (Signed)
Addended by: Valora Corporal on: 04/24/2022 09:38 AM   Modules accepted: Orders

## 2022-04-27 NOTE — Telephone Encounter (Signed)
Restasis filled by historical provider, patient requesting PCP refill first OV with you is 04/29/22 would you refill until appt.

## 2022-04-29 ENCOUNTER — Ambulatory Visit (INDEPENDENT_AMBULATORY_CARE_PROVIDER_SITE_OTHER): Payer: Medicare Other | Admitting: Family Medicine

## 2022-04-29 ENCOUNTER — Encounter: Payer: Self-pay | Admitting: Family Medicine

## 2022-04-29 VITALS — BP 130/82 | HR 76 | Temp 98.4°F | Ht 67.0 in | Wt 195.6 lb

## 2022-04-29 DIAGNOSIS — I739 Peripheral vascular disease, unspecified: Secondary | ICD-10-CM

## 2022-04-29 DIAGNOSIS — D509 Iron deficiency anemia, unspecified: Secondary | ICD-10-CM

## 2022-04-29 DIAGNOSIS — C61 Malignant neoplasm of prostate: Secondary | ICD-10-CM

## 2022-04-29 DIAGNOSIS — J449 Chronic obstructive pulmonary disease, unspecified: Secondary | ICD-10-CM | POA: Diagnosis not present

## 2022-04-29 DIAGNOSIS — K219 Gastro-esophageal reflux disease without esophagitis: Secondary | ICD-10-CM

## 2022-04-29 DIAGNOSIS — I10 Essential (primary) hypertension: Secondary | ICD-10-CM

## 2022-04-29 DIAGNOSIS — E049 Nontoxic goiter, unspecified: Secondary | ICD-10-CM

## 2022-04-29 DIAGNOSIS — I5032 Chronic diastolic (congestive) heart failure: Secondary | ICD-10-CM

## 2022-04-29 DIAGNOSIS — K5190 Ulcerative colitis, unspecified, without complications: Secondary | ICD-10-CM

## 2022-04-29 DIAGNOSIS — I25118 Atherosclerotic heart disease of native coronary artery with other forms of angina pectoris: Secondary | ICD-10-CM

## 2022-04-29 DIAGNOSIS — I700 Atherosclerosis of aorta: Secondary | ICD-10-CM

## 2022-04-29 DIAGNOSIS — R7303 Prediabetes: Secondary | ICD-10-CM

## 2022-04-29 DIAGNOSIS — K7469 Other cirrhosis of liver: Secondary | ICD-10-CM

## 2022-04-29 DIAGNOSIS — G8929 Other chronic pain: Secondary | ICD-10-CM

## 2022-04-29 DIAGNOSIS — M5441 Lumbago with sciatica, right side: Secondary | ICD-10-CM

## 2022-04-29 NOTE — Progress Notes (Signed)
SUBJECTIVE:   Chief Complaint  Patient presents with   Establish Care    Transfer of Care   HPI Patient presents to clinic to transfer care.  No acute concerns today.  Hypertension Asymptomatic.  Currently taking Norvasc 5 mg daily and Micardis 80-12.5 mg daily.  Tolerating medications well.  Denies any chest pain, shortness of breath, abdominal pain or lower extremity edema.  CAD History of multiple PCI's in the past.  Follows with cardiology.  Denies any anginal symptoms.  Has not used Nitrostat in years.  Currently taking Plavix 75 mg and metoprolol 100 mg daily.  Patient reports not taking low-dose aspirin daily.  HFpEF Asymptomatic today.  He reports some shortness of breath with activity, none with rest.  Denies any weight gain or increased lower extremity edema.  Following with cardiology.  Currently taking Lasix 20 mg daily.  Scheduled for echo 01/12.  Hyperlipidemia Currently on Lipitor 40 mg daily and tolerating medication well.  Chronic pain. Chronic opioid use.  Follows with pain management.  Currently taking oxycodone extended release 9 mg Q12 as needed, Lyrica 50 mg and Flexeril 10 mg nightly.  Goiter Asymptomatic.  Recent ultrasound shows multinodular thyroid gland.  3 of 4 nodules meeting criteria for biopsy.  Scheduled for endocrinology evaluation 06/23/2022.  PERTINENT PMH / PSH: CAD status post PCI x 7 stents HFpEF PVD status post bilateral lower extremity stenting x 3 Hepatitis C COPD Hypertension Hyperlipidemia Prostate CA Goiter  OBJECTIVE:  BP 130/82   Pulse 76   Temp 98.4 F (36.9 C)   Ht '5\' 7"'$  (1.702 m)   Wt 195 lb 9.6 oz (88.7 kg)   SpO2 99%   BMI 30.64 kg/m    Physical Exam Constitutional:      General: He is not in acute distress.    Appearance: He is normal weight. He is not ill-appearing.  HENT:     Head: Normocephalic.  Eyes:     Conjunctiva/sclera: Conjunctivae normal.  Cardiovascular:     Rate and Rhythm: Normal rate  and regular rhythm.     Pulses: Normal pulses.  Pulmonary:     Effort: Pulmonary effort is normal.     Breath sounds: Normal breath sounds.  Abdominal:     General: Bowel sounds are normal.  Neurological:     Mental Status: He is alert. Mental status is at baseline.  Psychiatric:        Mood and Affect: Mood normal.        Behavior: Behavior normal.        Thought Content: Thought content normal.        Judgment: Judgment normal.     ASSESSMENT/PLAN:  Essential hypertension Assessment & Plan: Chronic.  Stable.  Per JNC 8 guidelines at goal less than 140/90 Continue amlodipine 5 mg daily Continue metoprolol 100 mg daily Continue to follow with cardiology.  Orders: -     Comprehensive metabolic panel; Future  Chronic heart failure with preserved ejection fraction (HCC) Assessment & Plan: Chronic.  Stable.  Euvolemic on exam.  Exertional shortness of breath, scheduled for echo 01/12. Continue Lasix 20 mg daily Continue to follow cardiology as scheduled Strict return precautions provided.   Chronic obstructive pulmonary disease, unspecified COPD type ([place]) Assessment & Plan: Chronic.  Stable.  Reports some exertional shortness of breath.  History of tobacco use.  Was previously evaluated by pulmonology, with positive bronchodilator response on PFTs.  Was prescribed Spiriva and albuterol therapy. Recommend he continue to follow with  pulmonology.    Aortic atherosclerosis (HCC) Assessment & Plan: Chronic.  Stable.  On statin therapy and tolerating well.  No myalgias. Follows with cardiology. Continue Lipitor 40 mg daily   Orders: -     Lipid panel; Future  Peripheral arterial disease (HCC) Assessment & Plan: Chronic.  Stable. Continue Plavix 75 mg daily Recommend patient continue ASA 81 mg daily. Continue to follow with vascular surgery as scheduled.   Orders: -     Comprehensive metabolic panel; Future  Other cirrhosis of liver (HCC) Assessment &  Plan: Chronic.  Stable. Follows with GI   Ulcerative colitis without complications, unspecified location [facility]) Assessment & Plan: Chronic.  Stable.  Currently treated with mesalamine 2.4 g daily and Levbid 0.375 mg twice daily. Follows with GI   Prostate cancer [facility]) Assessment & Plan: Chronic.  Stable.   Follows with urology   Chronic right-sided low back pain with right-sided sciatica Assessment & Plan: Chronic.  Stable.  Tolerating oxycodone extended release 9 mg every 12, Lyrica 50 mg nightly and Flexeril 10 mg nightly. Follows with pain management   Prediabetes -     Hemoglobin A1c; Future  Iron deficiency anemia, unspecified iron deficiency anemia type -     CBC with Differential/Platelet; Future  Coronary artery disease of native artery of native heart with stable angina pectoris [facility])  Goiter Assessment & Plan: Asymptomatic. Scheduled for initial evaluation with endocrinology 06/2022    HCM Reports flu vaccine given at [facility] Recommend shingles and tetanus vaccine Declined pneumonia vaccine  PDMP reviewed  Return in about 6 months (around 10/28/2022) for annual visit with fasting labs 1 week prior.  Carollee Leitz, MD

## 2022-04-29 NOTE — Patient Instructions (Addendum)
It was a pleasure meeting you today. Thank you for allowing me to take part in your health care.  Our goals for today as we discussed include:  Follow up with Cardiology as scheduled  Follow up with Endocrinology as scheduled  Follow up with Pain management as scheduled  Follow up with Gastroenterology as scheduled   Follow up with PCP in 6 months for annual and lab work Please schedule an appointment 1 week prior to visit for labs   If you have any questions or concerns, please do not hesitate to call the office at [phone].  I look forward to our next visit and until then take care and stay safe.  Regards,   Carollee Leitz, MD   [facility]

## 2022-04-30 ENCOUNTER — Other Ambulatory Visit: Payer: Self-pay | Admitting: Internal Medicine

## 2022-05-01 ENCOUNTER — Ambulatory Visit: Payer: Federal, State, Local not specified - PPO | Attending: Physician Assistant

## 2022-05-01 DIAGNOSIS — R0602 Shortness of breath: Secondary | ICD-10-CM | POA: Diagnosis not present

## 2022-05-01 DIAGNOSIS — E785 Hyperlipidemia, unspecified: Secondary | ICD-10-CM

## 2022-05-02 LAB — ECHOCARDIOGRAM COMPLETE
AR max vel: 3.08 cm2
AV Area VTI: 3.3 cm2
AV Area mean vel: 2.96 cm2
AV Mean grad: 3 mmHg
AV Peak grad: 6.4 mmHg
Ao pk vel: 1.26 m/s
Area-P 1/2: 2.03 cm2
Calc EF: 49.4 %
S' Lateral: 3.4 cm
Single Plane A2C EF: 49.2 %
Single Plane A4C EF: 49.4 %

## 2022-05-06 ENCOUNTER — Other Ambulatory Visit: Payer: Self-pay | Admitting: Physician Assistant

## 2022-05-06 DIAGNOSIS — U071 COVID-19: Secondary | ICD-10-CM

## 2022-05-06 DIAGNOSIS — J9801 Acute bronchospasm: Secondary | ICD-10-CM

## 2022-05-07 ENCOUNTER — Encounter: Payer: Self-pay | Admitting: Family Medicine

## 2022-05-07 NOTE — Assessment & Plan Note (Signed)
Chronic.  Stable.   Follows with urology

## 2022-05-07 NOTE — Assessment & Plan Note (Signed)
Chronic.  Stable.  Currently treated with mesalamine 2.4 g daily and Levbid 0.375 mg twice daily. Follows with GI

## 2022-05-07 NOTE — Assessment & Plan Note (Signed)
Chronic.  Stable.  Per JNC 8 guidelines at goal less than 140/90 Continue amlodipine 5 mg daily Continue metoprolol 100 mg daily Continue to follow with cardiology.

## 2022-05-07 NOTE — Assessment & Plan Note (Signed)
Chronic.  Stable.  Euvolemic on exam.  Exertional shortness of breath, scheduled for echo 01/12. Continue Lasix 20 mg daily Continue to follow cardiology as scheduled Strict return precautions provided.

## 2022-05-07 NOTE — Assessment & Plan Note (Signed)
Chronic.  Stable. Continue Plavix 75 mg daily Recommend patient continue ASA 81 mg daily. Continue to follow with vascular surgery as scheduled.

## 2022-05-07 NOTE — Assessment & Plan Note (Signed)
Chronic.  Stable.  On statin therapy and tolerating well.  No myalgias. Follows with cardiology. Continue Lipitor 40 mg daily

## 2022-05-07 NOTE — Assessment & Plan Note (Signed)
Asymptomatic. Scheduled for initial evaluation with endocrinology 06/2022

## 2022-05-07 NOTE — Assessment & Plan Note (Signed)
Chronic.  Stable. Follows with GI

## 2022-05-07 NOTE — Assessment & Plan Note (Signed)
Chronic.  Stable.  Tolerating oxycodone extended release 9 mg every 12, Lyrica 50 mg nightly and Flexeril 10 mg nightly. Follows with pain management

## 2022-05-07 NOTE — Assessment & Plan Note (Signed)
Chronic.  Stable.  Reports some exertional shortness of breath.  History of tobacco use.  Was previously evaluated by pulmonology, with positive bronchodilator response on PFTs.  Was prescribed Spiriva and albuterol therapy. Recommend he continue to follow with pulmonology.

## 2022-05-12 IMAGING — CR DG CHEST 2V
1 series · 2 of 2 positions shown · non-contrast
Comparison: None.

CLINICAL DATA: Shortness of breath

EXAM:
CHEST - 2 VIEW

[Series 1: dg chest 2 view · 0.14mm/px · 2 of 2 slices shown]
[im 1/2]
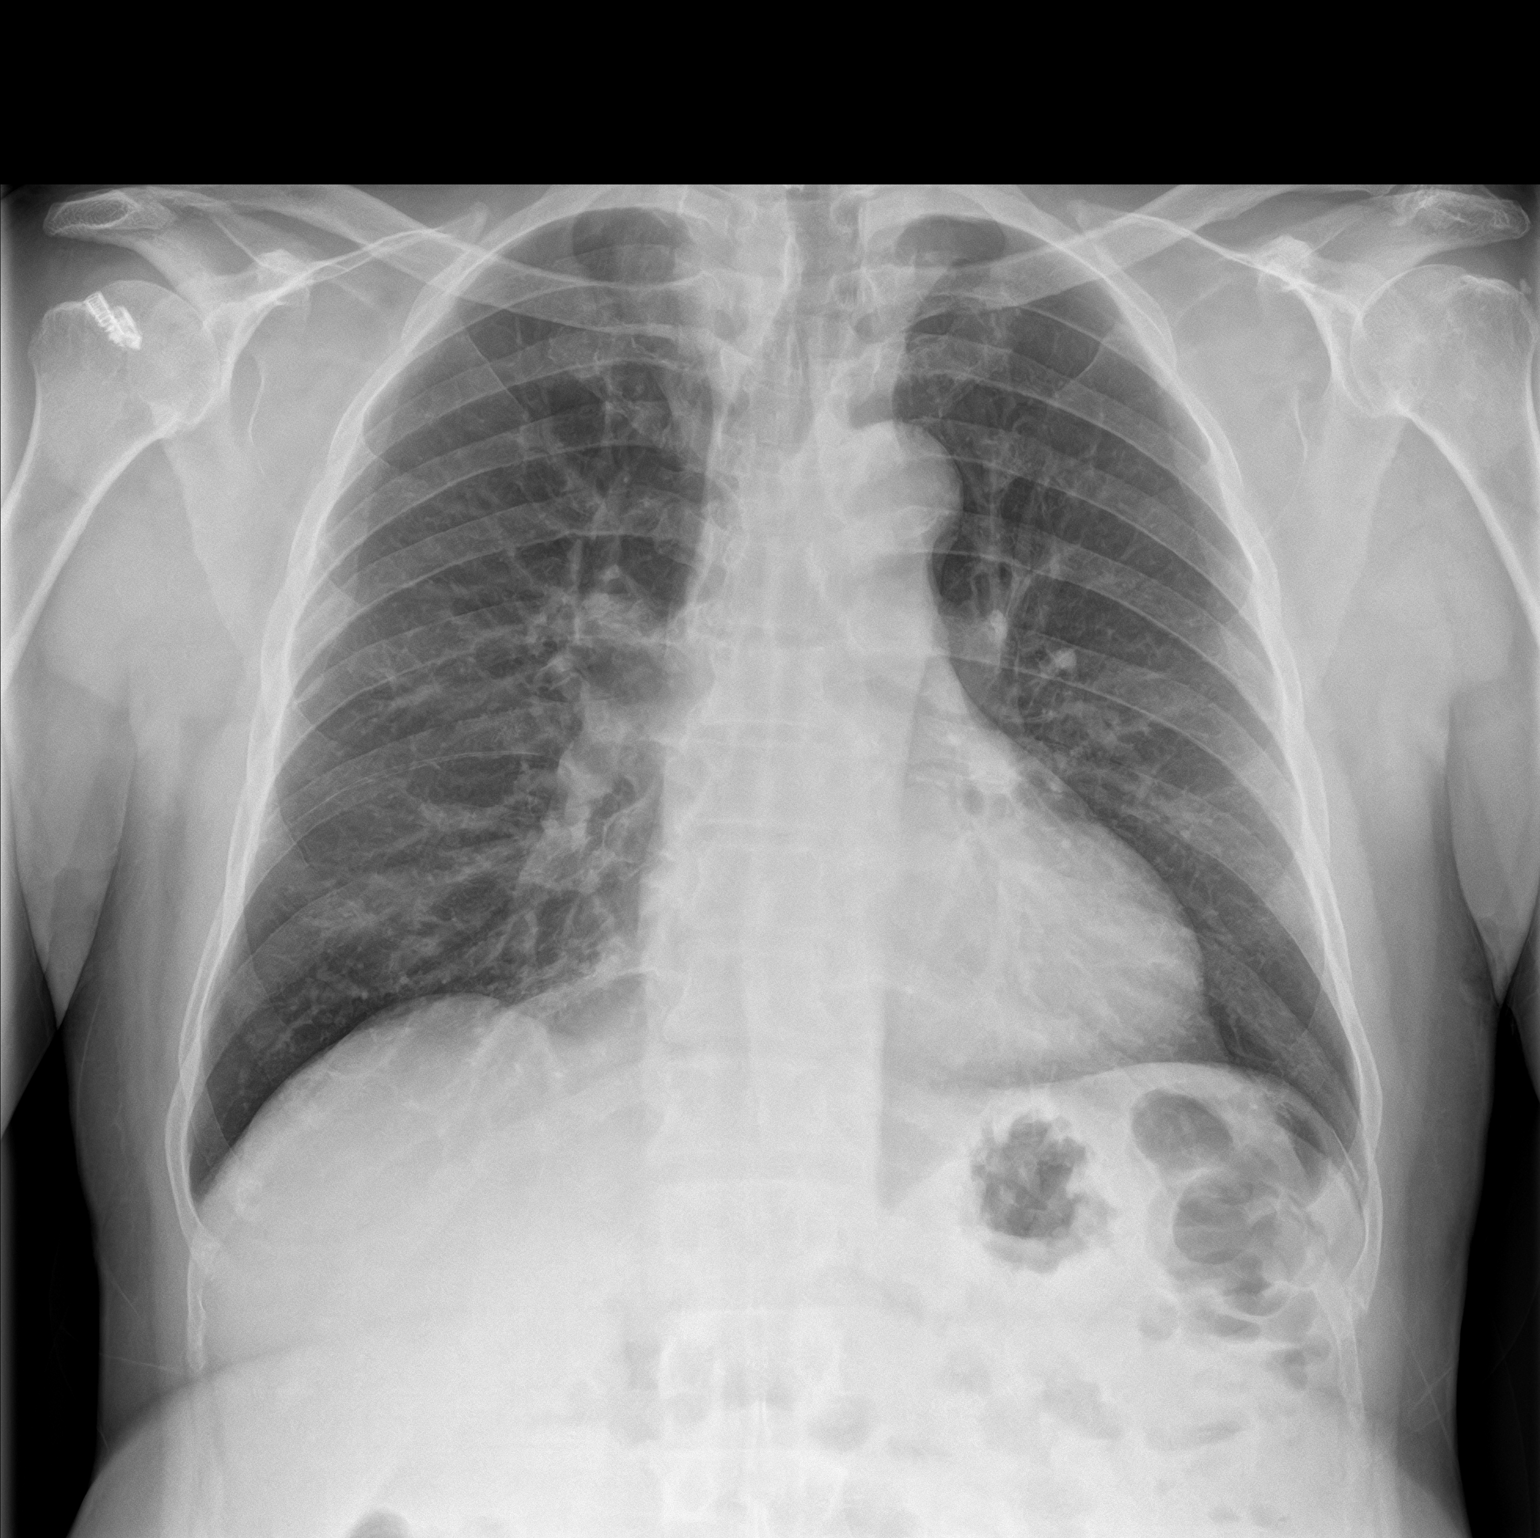
[im 2/2]
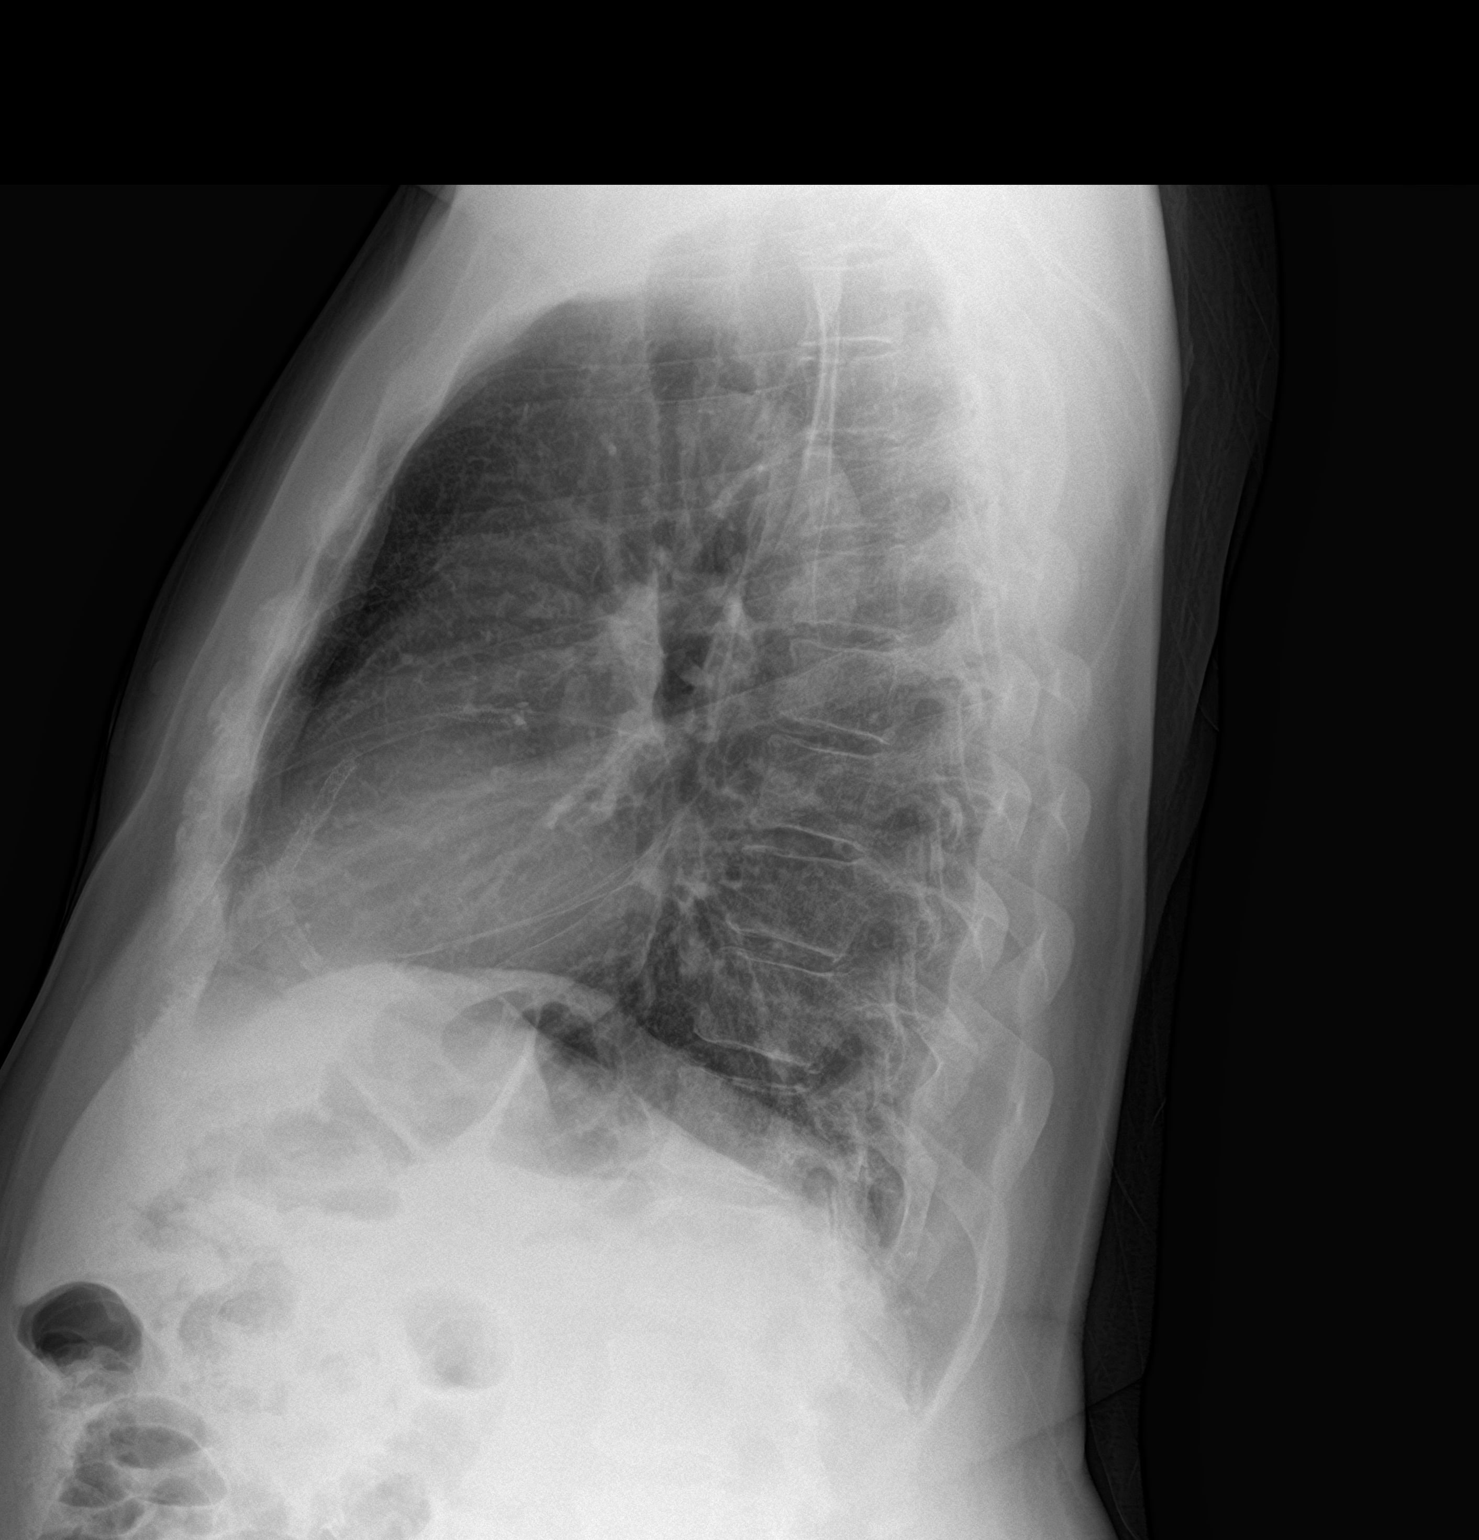

[2 of 2 positions shown; findings below may reference images not displayed]

FINDINGS: The heart size and mediastinal contours are within normal limits.
Both lungs are clear. No pleural effusion. No pneumothorax. The
visualized skeletal structures are unremarkable.
IMPRESSION: No acute process in the chest.

## 2022-06-03 ENCOUNTER — Other Ambulatory Visit: Payer: Self-pay | Admitting: Internal Medicine

## 2022-06-09 ENCOUNTER — Other Ambulatory Visit: Payer: Self-pay | Admitting: Physician Assistant

## 2022-06-09 DIAGNOSIS — U071 COVID-19: Secondary | ICD-10-CM

## 2022-06-09 DIAGNOSIS — J9801 Acute bronchospasm: Secondary | ICD-10-CM

## 2022-06-14 ENCOUNTER — Other Ambulatory Visit: Payer: Self-pay | Admitting: Physician Assistant

## 2022-06-14 DIAGNOSIS — U071 COVID-19: Secondary | ICD-10-CM

## 2022-06-14 DIAGNOSIS — J9801 Acute bronchospasm: Secondary | ICD-10-CM

## 2022-08-03 ENCOUNTER — Other Ambulatory Visit: Payer: Self-pay | Admitting: Physician Assistant

## 2022-08-03 DIAGNOSIS — J9801 Acute bronchospasm: Secondary | ICD-10-CM

## 2022-08-03 DIAGNOSIS — U071 COVID-19: Secondary | ICD-10-CM

## 2022-08-27 ENCOUNTER — Telehealth: Payer: Self-pay | Admitting: Family Medicine

## 2022-08-27 NOTE — Telephone Encounter (Signed)
Copied from CRM [id number]. Topic: Medicare AWV >> Aug 27, 2022  1:27 PM Payton Doughty wrote: Reason for CRM: Called patient to schedule Medicare Annual Wellness Visit (AWV). Left message for patient to call back and schedule Medicare Annual Wellness Visit (AWV).  Last date of AWV: 07/10/21  Please schedule an appointment at any time with Annabell Sabal, CMA  .  If any questions, please contact me.  Thank you ,  Verlee Rossetti; Care Guide Ambulatory Clinical Support [place] [facility] Direct Dial: [phone]

## 2022-08-31 ENCOUNTER — Telehealth: Payer: Self-pay | Admitting: Family Medicine

## 2022-08-31 NOTE — Telephone Encounter (Signed)
Contacted Bento Gantert to schedule their annual wellness visit. Appointment made for 10/12/2022.  Verlee Rossetti; Care Guide Ambulatory Clinical Support [place] [facility] Direct Dial: [phone]

## 2022-09-06 ENCOUNTER — Other Ambulatory Visit: Payer: Self-pay | Admitting: Physician Assistant

## 2022-09-06 DIAGNOSIS — J9801 Acute bronchospasm: Secondary | ICD-10-CM

## 2022-09-06 DIAGNOSIS — U071 COVID-19: Secondary | ICD-10-CM

## 2022-09-15 ENCOUNTER — Telehealth: Payer: Self-pay | Admitting: Family Medicine

## 2022-09-15 DIAGNOSIS — U071 COVID-19: Secondary | ICD-10-CM

## 2022-09-15 DIAGNOSIS — I10 Essential (primary) hypertension: Secondary | ICD-10-CM

## 2022-09-15 DIAGNOSIS — J9801 Acute bronchospasm: Secondary | ICD-10-CM

## 2022-09-15 MED ORDER — ALBUTEROL SULFATE HFA 108 (90 BASE) MCG/ACT IN AERS: INHALATION_SPRAY | RESPIRATORY_TRACT | 0 refills | Status: DC

## 2022-09-15 MED ORDER — TELMISARTAN-HCTZ 80-12.5 MG PO TABS: 1.0000 | ORAL_TABLET | Freq: Every day | ORAL | 3 refills | Status: DC

## 2022-09-15 NOTE — Telephone Encounter (Signed)
Rx's sent to pharmacy.  

## 2022-09-15 NOTE — Telephone Encounter (Signed)
Pt need a refill on telmisartan  and albuterol sent to [facility]

## 2022-10-12 ENCOUNTER — Ambulatory Visit (INDEPENDENT_AMBULATORY_CARE_PROVIDER_SITE_OTHER): Payer: Medicare Other | Admitting: Family Medicine

## 2022-10-12 VITALS — Ht 67.0 in | Wt 195.0 lb

## 2022-10-12 DIAGNOSIS — Z0000 Encounter for general adult medical examination without abnormal findings: Secondary | ICD-10-CM

## 2022-10-12 NOTE — Progress Notes (Signed)
Subjective:   Erik Nicholson is a 72 y.o. male who presents for Medicare Annual/Subsequent preventive examination.  Visit Complete: Virtual  I connected with  Erik Nicholson on 10/12/22 by a audio enabled telemedicine application and verified that I am speaking with the correct person using two identifiers.  Patient Location: Home  Provider Location: Office/Clinic  I discussed the limitations of evaluation and management by telemedicine. The patient expressed understanding and agreed to proceed.   Review of Systems     Cardiac Risk Factors include: hypertension;male gender;advanced age (>7men, >30 women);obesity (BMI >30kg/m2)     Objective:    Today's Vitals   10/12/22 1257  Weight: 195 lb (88.5 kg)  Height: 5\' 7"  (1.702 m)  PainSc: 10-Worst pain ever   Body mass index is 30.54 kg/m.     10/12/2022    1:25 PM 11/11/2021   11:47 AM 07/10/2021    8:44 AM 06/18/2021    6:19 AM 06/02/2021    9:49 AM 10/02/2020    6:50 AM 07/09/2020   11:39 AM  Advanced Directives  Does Patient Have a Medical Advance Directive? Yes Yes Yes Yes Yes No Yes  Type of Advance Directive Living will Living will Healthcare Power of [place];Living will Living will;Healthcare Power of [employer] Power of [place];Living will  Healthcare Power of Attorney  Does patient want to make changes to medical advance directive? Yes (MAU/Ambulatory/Procedural Areas - Information given) No - Patient declined No - Patient declined No - Patient declined   No - Patient declined  Copy of Healthcare Power of Attorney in Chart?   No - copy requested No - copy requested Yes - validated most recent copy scanned in chart (See row information)  No - copy requested  Would patient like information on creating a medical advance directive? No - Guardian declined   No - Patient declined  No - Patient declined     Current Medications (verified) Outpatient Encounter Medications as of 10/12/2022  Medication Sig    acetaminophen (TYLENOL) 500 MG tablet Take 500-1,000 mg by mouth every 6 (six) hours as needed (pain.).   albuterol (VENTOLIN HFA) 108 (90 Base) MCG/ACT inhaler INHALE 1-2 PUFFS BY MOUTH EVERY 6 HOURS AS NEEDED FOR WHEEZE OR SHORTNESS OF BREATH   aspirin EC 81 MG tablet Take 81 mg by mouth daily. Take while plavix is held per dr Nicholson   atorvastatin (LIPITOR) 40 MG tablet TAKE 1 TABLET BY MOUTH EVERY DAY   clopidogrel (PLAVIX) 75 MG tablet Take 1 tablet (75 mg total) by mouth daily.   cyclobenzaprine (FLEXERIL) 5 MG tablet Take 5-10 mg by mouth at bedtime as needed for muscle spasms.   cycloSPORINE (RESTASIS) 0.05 % ophthalmic emulsion Place 1 drop into both eyes 2 (two) times daily.   furosemide (LASIX) 20 MG tablet TAKE 1 TABLET (20 MG TOTAL) BY MOUTH AS DIRECTED. TAKE 1 TABLET DAILY FOR ONE WEEK.   hyoscyamine (LEVBID) 0.375 MG 12 hr tablet TAKE 1 TABLET BY MOUTH EVERY 12 HOURS AS NEEDED   mesalamine (LIALDA) 1.2 g EC tablet TAKE 2 TABLETS BY MOUTH DAILY WITH BREAKFAST.   metoprolol succinate (TOPROL-XL) 100 MG 24 hr tablet TAKE 1 TABLET BY MOUTH EVERY DAY WITH OR IMMEDIATELY FOLLOWING A MEAL   nitroGLYCERIN (NITROSTAT) 0.4 MG SL tablet TAKE 1 TABLET BY MOUTH SUBLINGUALLY EVERY 5 MINS AS NEEDED FOR CHEST PAIN. IF >1 NEEDED, CALL 911   telmisartan-hydrochlorothiazide (MICARDIS HCT) 80-12.5 MG tablet Take 1 tablet by mouth daily.  In am d/c 40-12.5 mg dose   amLODipine (NORVASC) 5 MG tablet Take 1 tablet (5 mg total) by mouth daily. D/c 10 mg dose (Patient not taking: Reported on 10/12/2022)   oxyCODONE (OXY IR/ROXICODONE) 5 MG immediate release tablet Take 5 mg by mouth every 6 (six) hours as needed for moderate pain. (Patient not taking: Reported on 10/12/2022)   oxyCODONE ER (XTAMPZA ER) 9 MG C12A Take 9 mg by mouth every 12 (twelve) hours as needed. (Patient not taking: Reported on 10/12/2022)   potassium chloride (MICRO-K) 10 MEQ CR capsule Take 2 capsules (20 mEq total) by mouth daily. D/c tablet  (Patient not taking: Reported on 10/12/2022)   pregabalin (LYRICA) 50 MG capsule Take 50 mg by mouth at bedtime. (Patient not taking: Reported on 10/12/2022)   No facility-administered encounter medications on file as of 10/12/2022.    Allergies (verified) Cymbalta [duloxetine hcl]   History: Past Medical History:  Diagnosis Date   Abdominal gas pain 12/23/2018   Abnormal gait 04/30/2021   Abnormal liver diagnostic imaging 09/16/2021   Abnormal MRI, lumbar spine 05/09/2021   Abnormal MRI, thoracic spine 05/09/2021   Abnormal weight loss 11/24/2018   Accelerating angina (HCC) 03/15/2020   Antiplatelet or antithrombotic long-term use 12/23/2018   Arthritis    both hands   Arthritis of right hip 05/05/2021   Benign prostatic hyperplasia with post-void dribbling 11/24/2018   BPH (benign prostatic hyperplasia)    CAD (coronary artery disease)    x 7 cardiac stents   Chronic back pain    L4/5 with chronic right leg pain    Chronic pain syndrome 11/24/2018   Chronic radicular lumbar pain (Right) 01/17/2019   Claudication in peripheral vascular disease (HCC) 01/07/2019   COVID-19    10/20/20   Dyspnea on exertion 08/17/2020   Floater, vitreous, left    Gastric nodule 05/31/2020   History of chronic ulcerative colitis 05/31/2020   History of lumbar surgery (x2) 01/17/2019   History of prostate cancer 02/10/2021   Hx of hepatitis C    treated with Harvoni in 2014   Hx of hepatitis C 12/23/2018   Hyperlipidemia    Hypertension    Intestinal metaplasia of gastric mucosa 12/23/2018   Leg edema 04/30/2021   Leg edema, right 05/03/2019   Lumbar radiculopathy, right 04/30/2021   Malignant neoplasm of prostate (HCC) 03/28/2019   Mid back pain 04/30/2021   Neuromuscular disorder (HCC)    nerve damage in back   Neuropathy    tingling in toes @ times   Other hemorrhoids 12/23/2018   Palpitations 02/10/2021   Prostate cancer (HCC)    fall 2022, radiation therapy done dr Kathrynn Running   PVD  (peripheral vascular disease) (HCC)    2 stents right leg and 1 in left    Rectal urgency 12/23/2018   Right hip pain 04/30/2021   Right leg weakness 05/03/2019   Right wrist pain 04/30/2021   S/P insertion of penile implant 11/11/2021   Subepithelial gastric lesion 03/25/2021   Ulcerative chronic pancolitis without complications (HCC) 12/23/2018   Ulcerative colitis (HCC)    in remission since fall of 2022   Ulcerative colitis with complication (HCC) 11/24/2018   Urinary incontinence 11/24/2018   Weakness of both lower extremities 04/30/2021   Weight gain 03/25/2021   Past Surgical History:  Procedure Laterality Date   back surgery     x 2 lumbar last 09/2016 in [place]   BIOPSY  06/02/2021   Procedure: BIOPSY;  Surgeon: Lemar Lofty., MD;  Location: [facility];  Service: Gastroenterology;;  EGD and COLON   CARDIAC CATHETERIZATION     CATARACT EXTRACTION, BILATERAL     COLONOSCOPY     last 09/2018 in    COLONOSCOPY WITH PROPOFOL N/A 06/02/2021   Procedure: COLONOSCOPY WITH PROPOFOL;  Surgeon: Meridee Score Netty Starring., MD;  Location: [facility];  Service: Gastroenterology;  Laterality: N/A;   CORONARY BALLOON ANGIOPLASTY N/A 03/21/2020   Procedure: CORONARY BALLOON ANGIOPLASTY;  Surgeon: Yvonne Kendall, MD;  Location: [facility];  Service: Cardiovascular;  Laterality: N/A;   CORONARY PRESSURE/FFR STUDY N/A 03/21/2020   Procedure: INTRAVASCULAR PRESSURE WIRE/FFR STUDY;  Surgeon: Yvonne Kendall, MD;  Location: [facility];  Service: Cardiovascular;  Laterality: N/A;   ESOPHAGOGASTRODUODENOSCOPY (EGD) WITH PROPOFOL N/A 06/02/2021   Procedure: ESOPHAGOGASTRODUODENOSCOPY (EGD) WITH PROPOFOL;  Surgeon: Meridee Score Netty Starring., MD;  Location: [facility];  Service: Gastroenterology;  Laterality: N/A;   HERNIA REPAIR     right    LEFT HEART CATH AND CORONARY ANGIOGRAPHY N/A 03/21/2020   Procedure: LEFT HEART CATH AND CORONARY  ANGIOGRAPHY;  Surgeon: Yvonne Kendall, MD;  Location: [facility];  Service: Cardiovascular;  Laterality: N/A;   PENILE PROSTHESIS IMPLANT  1995   PERCUTANEOUS CORONARY STENT INTERVENTION (PCI-S)     x 7-8 heart   PERIPHERAL ARTERIAL STENT GRAFT     PROSTATE BIOPSY     pvd     with stenting x 2 right leg below knee and 1 left thigh    REMOVAL OF PENILE PROSTHESIS N/A 11/11/2021   Procedure: REMOVAL OF PENILE PROSTHESIS AND REPLACEMENT OF PENILE PROSTHESIS [employer];  Surgeon: Despina Arias, MD;  Location: [facility];  Service: Urology;  Laterality: N/A;   SHOULDER ARTHROSCOPY Bilateral    TRIGGER FINGER RELEASE Left    UPPER ESOPHAGEAL ENDOSCOPIC ULTRASOUND (EUS) N/A 06/02/2021   Procedure: UPPER ESOPHAGEAL ENDOSCOPIC ULTRASOUND (EUS);  Surgeon: Lemar Lofty., MD;  Location: [facility];  Service: Gastroenterology;  Laterality: N/A;   VITRECTOMY Left    left eye   Family History  Problem Relation Age of Onset   Heart disease Mother    Stroke Mother    Breast cancer Mother    Diabetes Sister    Diabetes Brother    Colon cancer Neg Hx    Esophageal cancer Neg Hx    Inflammatory bowel disease Neg Hx    Liver disease Neg Hx    Pancreatic cancer Neg Hx    Rectal cancer Neg Hx    Stomach cancer Neg Hx    Social History   Socioeconomic History   Marital status: Married    Spouse name: Erik Nicholson   Number of children: 2   Years of education: Not on file   Highest education level: GED or equivalent  Occupational History   Occupation: retired  Tobacco Use   Smoking status: Former    Packs/day: 2.00    Years: 40.00    Additional pack years: 0.00    Total pack years: 80.00    Types: Cigarettes    Quit date: 04/20/1998    Years since quitting: 24.4   Smokeless tobacco: Never   Tobacco comments:    quit in 2000s   Vaping Use   Vaping Use: Never used  Substance and Sexual Activity   Alcohol use: Yes    Comment: occasionally   Drug  use: Not Currently   Sexual activity: Yes  Other Topics Concern  Not on file  Social History Narrative   Moved from [place] in 10/2018   2 sons in 79s as of 11/24/2018    Married wife is DPR Erik Nicholson x 45 years as of 04/2019   Social Determinants of Health   Financial Resource Strain: Low Risk  (10/12/2022)   Overall Financial Resource Strain (CARDIA)    Difficulty of Paying Living Expenses: Not hard at all  Food Insecurity: No Food Insecurity (10/12/2022)   Hunger Vital Sign    Worried About Running Out of Food in the Last Year: Never true    Ran Out of Food in the Last Year: Never true  Transportation Needs: No Transportation Needs (10/12/2022)   PRAPARE - Administrator, Civil Service (Medical): No    Lack of Transportation (Non-Medical): No  Physical Activity: Inactive (10/12/2022)   Exercise Vital Sign    Days of Exercise per Week: 0 days    Minutes of Exercise per Session: 0 min  Stress: No Stress Concern Present (10/12/2022)   [employer] of Occupational Health - Occupational Stress Questionnaire    Feeling of Stress : Not at all  Social Connections: Moderately Isolated (10/12/2022)   Social Connection and Isolation Panel [NHANES]    Frequency of Communication with Friends and Family: More than three times a week    Frequency of Social Gatherings with Friends and Family: More than three times a week    Attends Religious Services: Never    Database administrator or Organizations: No    Attends Engineer, structural: Never    Marital Status: Married    Tobacco Counseling Counseling given: Not Answered Tobacco comments: quit in 2000s    Clinical Intake:  Pre-visit preparation completed: Yes  Pain : 0-10 Pain Score: 10-Worst pain ever Pain Type: Chronic pain Pain Location: Back (neuropathy pain going from hip down to legs, unable to feel anything in r leg) Pain Orientation: Left Pain Radiating Towards: toward leg Pain Onset: More than a  month ago Pain Frequency: Constant     BMI - recorded: 30.54 Nutritional Status: BMI > 30  Obese Diabetes: No  How often do you need to have someone help you when you read instructions, pamphlets, or other written materials from your doctor or pharmacy?: 1 - Never What is the last grade level you completed in school?: GED 1964  Interpreter Needed?: No  Information entered by :: Erik Nicholson, Erik Nicholson   Activities of Daily Living    10/12/2022    1:02 PM 11/11/2021   11:54 AM  In your present state of health, do you have any difficulty performing the following activities:  Hearing? 0 0  Vision? 1 0  Comment has floater in on eye, had cataract in both eyes   Difficulty concentrating or making decisions? 1 0  Comment foregets sometimes   Walking or climbing stairs? 1 1  Comment climing issues due to neuopathy pain. occasionally uses a cane at times  Dressing or bathing? 1 0  Comment yes due to recent weight gain within the last year   Doing errands, shopping? 0   Preparing Food and eating ? N   Using the Toilet? N   In the past six months, have you accidently leaked urine? Y   Comment suffering from collitis   Do you have problems with loss of bowel control? Y   Comment not in issue when being home   Managing your Medications? N   Managing your Finances?  N   Housekeeping or managing your Housekeeping? N     Patient Care Team: Erik Allan, MD as PCP - General (Family Medicine) Nicholson, Erik Deer, MD as PCP - Cardiology (Cardiology) Erik Gully, MD (Inactive) as Consulting Physician (Urology) Erik Dys, MD as Consulting Physician (Radiation Oncology) Erik Gage, RN Nurse Navigator as Registered Nurse (Medical Oncology)  Indicate any recent Medical Services you may have received from other than [facility] providers in the past year (date may be approximate).     Assessment:   This is a routine wellness examination for [place].  Hearing/Vision screen Hearing Screening -  Comments:: No concerns.   Dietary issues and exercise activities discussed:     Goals Addressed               This Visit's Progress     Increase physical activity (pt-stated)   On track     I would like to walk more for exercise when I can.      Depression Screen    10/12/2022    1:16 PM 04/29/2022    1:15 PM 07/10/2021    8:48 AM 02/06/2021   11:24 AM 07/09/2020   11:39 AM 08/04/2019   10:34 AM 05/03/2019   10:38 AM  PHQ 2/9 Scores  PHQ - 2 Score 0 0 0 0 0 0 0  PHQ- 9 Score 6 0         Fall Risk    10/12/2022    1:07 PM 04/29/2022    1:15 PM 07/10/2021    8:45 AM 02/06/2021   11:24 AM 11/05/2020    3:25 PM  Fall Risk   Falls in the past year? 1 1  1 1   Number falls in past yr: 1 0  1 0  Comment hairline fracture on one wrist, about a year ago      Injury with Fall? 1 1  1 1   Risk for fall due to : No Fall Risks;Other (Comment) History of fall(s) Impaired balance/gait History of fall(s) History of fall(s)  Risk for fall due to: Comment pt tripped      Follow up Falls prevention discussed Falls evaluation completed Falls evaluation completed Falls evaluation completed Falls evaluation completed    MEDICARE RISK AT HOME:   TIMED UP AND GO:  Was the test performed?  No    Cognitive Function:        10/12/2022    1:27 PM 07/09/2020   11:43 AM  6CIT Screen  What Year? 0 points 0 points  What month? 0 points 0 points  What time? 0 points 0 points  Count back from 20 0 points 0 points  Months in reverse 2 points 0 points  Repeat phrase 0 points 0 points  Total Score 2 points 0 points    Immunizations Immunization History  Administered Date(s) Administered   Fluad Quad(high Dose 65+) 01/04/2019, 01/29/2020, 02/05/2021   Hepb-cpg 09/22/2021   Influenza, High Dose Seasonal PF 01/13/2018   PFIZER Comirnaty(Gray Top)Covid-19 Tri-Sucrose Vaccine 02/05/2021   PFIZER(Purple Top)SARS-COV-2 Vaccination 06/18/2019, 07/12/2019, 03/29/2020    TDAP status: Patient  wishes to receive this vaccine today after disclosing that insurance does not cover the vaccine as a preventative vaccine.   Flu Vaccine status: Up to date  Pneumococcal vaccine status: Due, Education has been provided regarding the importance of this vaccine. Advised may receive this vaccine at local pharmacy or Health Dept. Aware to provide a copy of the vaccination record if obtained from local pharmacy or  Health Dept. Verbalized acceptance and understanding.  Covid-19 vaccine status: Completed vaccines  Qualifies for Shingles Vaccine? Yes   Zostavax completed No   Shingrix Completed?: No.    Education has been provided regarding the importance of this vaccine. Patient has been advised to call insurance company to determine out of pocket expense if they have not yet received this vaccine. Advised may also receive vaccine at local pharmacy or Health Dept. Verbalized acceptance and understanding.  Screening Tests Health Maintenance  Topic Date Due   DTaP/Tdap/Td (1 - Tdap) Never done   Zoster Vaccines- Shingrix (1 of 2) Never done   Pneumonia Vaccine 14+ Years old (1 of 1 - PCV) Never done   COVID-19 Vaccine (5 - 2023-24 season) 12/19/2021   Colonoscopy  06/02/2022   INFLUENZA VACCINE  11/19/2022   Medicare Annual Wellness (AWV)  10/12/2023   Hepatitis C Screening  Completed   HPV VACCINES  Aged Out    Health Maintenance  Health Maintenance Due  Topic Date Due   DTaP/Tdap/Td (1 - Tdap) Never done   Zoster Vaccines- Shingrix (1 of 2) Never done   Pneumonia Vaccine 31+ Years old (1 of 1 - PCV) Never done   COVID-19 Vaccine (5 - 2023-24 season) 12/19/2021   Colonoscopy  06/02/2022    Colorectal cancer screening: Type of screening: Colonoscopy. Completed 06/02/2021. Repeat every 1 years  Lung Cancer Screening: (Low Dose CT Chest recommended if Age 47-80 years, 20 pack-year currently smoking OR have quit w/in 15years.) does not qualify.   Lung Cancer Screening Referral:  n/a  Additional Screening:  Hepatitis C Screening: does qualify; Completed 04/29/2022  Vision Screening: Recommended annual ophthalmology exams for early detection of glaucoma and other disorders of the eye. Is the patient up to date with their annual eye exam?  Yes  Who is the provider or what is the name of the office in which the patient attends annual eye exams? [place] office (unable to remember office name) If pt is not established with a provider, would they like to be referred to a provider to establish care? No .   Dental Screening: Recommended annual dental exams for proper oral hygiene   Community Resource Referral / Chronic Care Management: CRR required this visit?  No   CCM required this visit?  No     Plan:     I have personally reviewed and noted the following in the patient's chart:   Medical and social history Use of alcohol, tobacco or illicit drugs  Current medications and supplements including opioid prescriptions. Patient is not currently taking opioid prescriptions. Functional ability and status Nutritional status Physical activity Advanced directives List of other physicians Hospitalizations, surgeries, and ER visits in previous 12 months Vitals Screenings to include cognitive, depression, and falls Referrals and appointments  In addition, I have reviewed and discussed with patient certain preventive protocols, quality metrics, and best practice recommendations. A written personalized care plan for preventive services as well as general preventive health recommendations were provided to patient.     Erik Nicholson  Erik Nicholson, Erik Nicholson   10/12/2022   After Visit Summary: (MyChart) Due to this being a telephonic visit, the after visit summary with patients personalized plan was offered to patient via MyChart   Nurse Notes: pt would like to get any required vaccines done as soon as possible.

## 2022-10-12 NOTE — Patient Instructions (Addendum)
[name] , Thank you for taking time to come for your Medicare Wellness Visit. I appreciate your ongoing commitment to your health goals. Please review the following plan we discussed and let me know if I can assist you in the future.   These are the goals we discussed:  Goals       Increase physical activity (pt-stated)      I would like to walk more for exercise when I can.        This is a list of the screening recommended for you and due dates:  Health Maintenance  Topic Date Due   DTaP/Tdap/Td vaccine (1 - Tdap) Never done   Zoster (Shingles) Vaccine (1 of 2) Never done   Pneumonia Vaccine (1 of 1 - PCV) Never done   COVID-19 Vaccine (5 - 2023-24 season) 12/19/2021   Colon Cancer Screening  06/02/2022   Flu Shot  11/19/2022   Medicare Annual Wellness Visit  10/12/2023   Hepatitis C Screening  Completed   HPV Vaccine  Aged Out    Health Maintenance After Age 88 After age 6, you are at a higher risk for certain long-term diseases and infections as well as injuries from falls. Falls are a major cause of broken bones and head injuries in people who are older than age 52. Getting regular preventive care can help to keep you healthy and well. Preventive care includes getting regular testing and making lifestyle changes as recommended by your health care provider. Talk with your health care provider about: Which screenings and tests you should have. A screening is a test that checks for a disease when you have no symptoms. A diet and exercise plan that is right for you. What should I know about screenings and tests to prevent falls? Screening and testing are the best ways to find a health problem early. Early diagnosis and treatment give you the best chance of managing medical conditions that are common after age 19. Certain conditions and lifestyle choices may make you more likely to have a fall. Your health care provider may recommend: Regular vision checks. Poor vision and  conditions such as cataracts can make you more likely to have a fall. If you wear glasses, make sure to get your prescription updated if your vision changes. Medicine review. Work with your health care provider to regularly review all of the medicines you are taking, including over-the-counter medicines. Ask your health care provider about any side effects that may make you more likely to have a fall. Tell your health care provider if any medicines that you take make you feel dizzy or sleepy. Strength and balance checks. Your health care provider may recommend certain tests to check your strength and balance while standing, walking, or changing positions. Foot health exam. Foot pain and numbness, as well as not wearing proper footwear, can make you more likely to have a fall. Screenings, including: Osteoporosis screening. Osteoporosis is a condition that causes the bones to get weaker and break more easily. Blood pressure screening. Blood pressure changes and medicines to control blood pressure can make you feel dizzy. Depression screening. You may be more likely to have a fall if you have a fear of falling, feel depressed, or feel unable to do activities that you used to do. Alcohol use screening. Using too much alcohol can affect your balance and may make you more likely to have a fall. Follow these instructions at home: Lifestyle Do not drink alcohol if: Your health  care provider tells you not to drink. If you drink alcohol: Limit how much you have to: 0-1 drink a day for women. 0-2 drinks a day for men. Know how much alcohol is in your drink. In the [place] one drink equals one 12 oz bottle of beer (355 mL), one 5 oz glass of wine (148 mL), or one 1 oz glass of hard liquor (44 mL). Do not use any products that contain nicotine or tobacco. These products include cigarettes, chewing tobacco, and vaping devices, such as e-cigarettes. If you need help quitting, ask your health care  provider. Activity  Follow a regular exercise program to stay fit. This will help you maintain your balance. Ask your health care provider what types of exercise are appropriate for you. If you need a cane or walker, use it as recommended by your health care provider. Wear supportive shoes that have nonskid soles. Safety  Remove any tripping hazards, such as rugs, cords, and clutter. Install safety equipment such as grab bars in bathrooms and safety rails on stairs. Keep rooms and walkways well-lit. General instructions Talk with your health care provider about your risks for falling. Tell your health care provider if: You fall. Be sure to tell your health care provider about all falls, even ones that seem minor. You feel dizzy, tiredness (fatigue), or off-balance. Take over-the-counter and prescription medicines only as told by your health care provider. These include supplements. Eat a healthy diet and maintain a healthy weight. A healthy diet includes low-fat dairy products, low-fat (lean) meats, and fiber from whole grains, beans, and lots of fruits and vegetables. Stay current with your vaccines. Schedule regular health, dental, and eye exams. Summary Having a healthy lifestyle and getting preventive care can help to protect your health and wellness after age 55. Screening and testing are the best way to find a health problem early and help you avoid having a fall. Early diagnosis and treatment give you the best chance for managing medical conditions that are more common for people who are older than age 92. Falls are a major cause of broken bones and head injuries in people who are older than age 8. Take precautions to prevent a fall at home. Work with your health care provider to learn what changes you can make to improve your health and wellness and to prevent falls. This information is not intended to replace advice given to you by your health care provider. Make sure you discuss  any questions you have with your health care provider. Document Revised: 08/26/2020 Document Reviewed: 08/26/2020 Elsevier Patient Education  2024 [employer].

## 2022-10-20 ENCOUNTER — Telehealth: Payer: Self-pay

## 2022-10-20 DIAGNOSIS — Z8619 Personal history of other infectious and parasitic diseases: Secondary | ICD-10-CM

## 2022-10-20 NOTE — Telephone Encounter (Signed)
Left message on machine to call back  

## 2022-10-20 NOTE — Telephone Encounter (Signed)
-----   Message from Loretha Stapler, RN sent at 04/21/2022 10:51 AM EST ----- HCC screening in 6 months

## 2022-10-21 NOTE — Telephone Encounter (Signed)
The pt has agreed to the [place] for followup. Order has been entered and sent to the schedulers.

## 2022-10-26 ENCOUNTER — Other Ambulatory Visit (INDEPENDENT_AMBULATORY_CARE_PROVIDER_SITE_OTHER): Payer: Medicare Other

## 2022-10-26 ENCOUNTER — Telehealth: Payer: Federal, State, Local not specified - PPO | Admitting: Family Medicine

## 2022-10-26 DIAGNOSIS — I739 Peripheral vascular disease, unspecified: Secondary | ICD-10-CM

## 2022-10-26 DIAGNOSIS — I10 Essential (primary) hypertension: Secondary | ICD-10-CM | POA: Diagnosis not present

## 2022-10-26 DIAGNOSIS — R7303 Prediabetes: Secondary | ICD-10-CM | POA: Diagnosis not present

## 2022-10-26 DIAGNOSIS — D509 Iron deficiency anemia, unspecified: Secondary | ICD-10-CM | POA: Diagnosis not present

## 2022-10-26 DIAGNOSIS — I700 Atherosclerosis of aorta: Secondary | ICD-10-CM | POA: Diagnosis not present

## 2022-10-26 LAB — COMPREHENSIVE METABOLIC PANEL
ALT: 25 U/L (ref 0–53)
AST: 33 U/L (ref 0–37)
Albumin: 4.3 g/dL (ref 3.5–5.2)
Alkaline Phosphatase: 88 U/L (ref 39–117)
BUN: 14 mg/dL (ref 6–23)
CO2: 25 mEq/L (ref 19–32)
Calcium: 9.9 mg/dL (ref 8.4–10.5)
Chloride: 104 mEq/L (ref 96–112)
Creatinine, Ser: 1.08 mg/dL (ref 0.40–1.50)
GFR: 67.8 mL/min (ref >60.00)
Glucose, Bld: 135 mg/dL — ABNORMAL HIGH (ref 70–99)
Potassium: 3.5 mEq/L (ref 3.5–5.1)
Sodium: 142 mEq/L (ref 135–145)
Total Bilirubin: 0.5 mg/dL (ref 0.2–1.2)
Total Protein: 7.8 g/dL (ref 6.0–8.3)

## 2022-10-26 LAB — HEMOGLOBIN A1C: Hgb A1c MFr Bld: 5.7 % (ref 4.6–6.5)

## 2022-10-26 LAB — LIPID PANEL
Cholesterol: 151 mg/dL (ref 0–200)
HDL: 47.6 mg/dL (ref >39.00)
NonHDL: 103.59
Total CHOL/HDL Ratio: 3
Triglycerides: 253 mg/dL — ABNORMAL HIGH (ref 0.0–149.0)
VLDL: 50.6 mg/dL — ABNORMAL HIGH (ref 0.0–40.0)

## 2022-10-26 LAB — CBC WITH DIFFERENTIAL/PLATELET
Basophils Absolute: 0.1 10*3/uL (ref 0.0–0.1)
Basophils Relative: 0.8 % (ref 0.0–3.0)
Eosinophils Absolute: 0.2 10*3/uL (ref 0.0–0.7)
Eosinophils Relative: 3.1 % (ref 0.0–5.0)
HCT: 40.1 % (ref 39.0–52.0)
Hemoglobin: 13.2 g/dL (ref 13.0–17.0)
Lymphocytes Relative: 33 % (ref 12.0–46.0)
Lymphs Abs: 2.3 10*3/uL (ref 0.7–4.0)
MCHC: 32.9 g/dL (ref 30.0–36.0)
MCV: 103.6 fl — ABNORMAL HIGH (ref 78.0–100.0)
Monocytes Absolute: 0.8 10*3/uL (ref 0.1–1.0)
Monocytes Relative: 11.6 % (ref 3.0–12.0)
Neutro Abs: 3.5 10*3/uL (ref 1.4–7.7)
Neutrophils Relative %: 51.5 % (ref 43.0–77.0)
Platelets: 239 10*3/uL (ref 150.0–400.0)
RBC: 3.87 Mil/uL — ABNORMAL LOW (ref 4.22–5.81)
RDW: 13.4 % (ref 11.5–15.5)
WBC: 6.9 10*3/uL (ref 4.0–10.5)

## 2022-10-26 LAB — LDL CHOLESTEROL, DIRECT: Direct LDL: 59 mg/dL

## 2022-10-27 ENCOUNTER — Ambulatory Visit (HOSPITAL_COMMUNITY)
Admission: RE | Admit: 2022-10-27 | Discharge: 2022-10-27 | Disposition: A | Discharge status: Home / Self Care | Payer: Medicare Other | Source: Ambulatory Visit | Attending: Gastroenterology | Admitting: Gastroenterology

## 2022-10-27 DIAGNOSIS — Z8619 Personal history of other infectious and parasitic diseases: Secondary | ICD-10-CM | POA: Diagnosis not present

## 2022-10-30 ENCOUNTER — Other Ambulatory Visit: Payer: Self-pay | Admitting: Internal Medicine

## 2022-10-30 NOTE — Telephone Encounter (Signed)
Please schedule overdue 6 month F/U appt for 90 day refills. Thank you!

## 2022-11-03 ENCOUNTER — Telehealth: Payer: Federal, State, Local not specified - PPO | Admitting: Family Medicine

## 2022-11-03 ENCOUNTER — Telehealth: Payer: Self-pay

## 2022-11-03 ENCOUNTER — Encounter: Payer: Self-pay | Admitting: Nurse Practitioner

## 2022-11-03 ENCOUNTER — Telehealth: Payer: Medicare Other | Admitting: Nurse Practitioner

## 2022-11-03 DIAGNOSIS — G8929 Other chronic pain: Secondary | ICD-10-CM

## 2022-11-03 DIAGNOSIS — E049 Nontoxic goiter, unspecified: Secondary | ICD-10-CM | POA: Diagnosis not present

## 2022-11-03 DIAGNOSIS — M5441 Lumbago with sciatica, right side: Secondary | ICD-10-CM | POA: Diagnosis not present

## 2022-11-03 NOTE — Assessment & Plan Note (Signed)
Chronic issue for many years. Hx of 2 back surgeries. Used to be followed by pain management, unable to afford medications. Has been without pain medications since January. PDMP reviewed. Referral placed for patient to establish with new pain management group.

## 2022-11-03 NOTE — Assessment & Plan Note (Signed)
Patient believes goiter/nodules have gotten larger. Difficulty with swallowing recently. He does not remember if he has been evaluated by Endocrinology. Phone number provided to [facility] in [place] where patient was referred and scheduled for evaluation in March. He will contact them for follow up or to set up an evaluation. He will let me or his PCP know if he needs a new referral or evaluation by GI.

## 2022-11-03 NOTE — Progress Notes (Signed)
MyChart Video Visit    Virtual Visit via Video Note   This visit type was conducted because this format is felt to be most appropriate for this patient at this time. Physical exam was limited by quality of the video and audio technology used for the visit. CMA was able to get the patient set up on a video visit.  Patient location: Home. Patient and provider in visit Provider location: Office  I discussed the limitations of evaluation and management by telemedicine and the availability of in person appointments. The patient expressed understanding and agreed to proceed.  Visit Date: 11/03/2022  Today's healthcare provider: Bethanie Dicker, NP     Subjective:    Patient ID: Erik Nicholson, male    DOB: 1951/02/10, 72 y.o.   MRN: [id number]  Chief Complaint  Patient presents with   Referral    Referral to pain management sharp stabbing pain starts on the right hip and goes down. Has neuropathy in the right foot, he can't foot can't roll over,  stand up, or walk far    HPI  Patient with history of chronic right sided back pain and neuropathy for approximately 30 years. Hx of two previous lumbar surgeries. He is requesting referral for pain management. He used to be followed by Dr. Ronita Hipps with [facility]. He was being treated with Oxycodone ER 9 mg BID as needed and Lyrica 50 mg at bedtime. He has not had these medications since January as he was unable to continue to afford them at the time and states he then was released from the practice.   Patient also with Hx of multiple thyroid nodules and goiter. He believes they are getting larger. He is unable to remember if he had biopsies of the nodules and if he was evaluated by Endocrinology. He believes the nodules are causing him to have difficulty with swallowing. Upon review of previous notes in his chart he was scheduled to see [facility] in [place] in March of this year.   Past Medical  History:  Diagnosis Date   Abdominal gas pain 12/23/2018   Abnormal gait 04/30/2021   Abnormal liver diagnostic imaging 09/16/2021   Abnormal MRI, lumbar spine 05/09/2021   Abnormal MRI, thoracic spine 05/09/2021   Abnormal weight loss 11/24/2018   Accelerating angina (HCC) 03/15/2020   Antiplatelet or antithrombotic long-term use 12/23/2018   Arthritis    both hands   Arthritis of right hip 05/05/2021   Benign prostatic hyperplasia with post-void dribbling 11/24/2018   BPH (benign prostatic hyperplasia)    CAD (coronary artery disease)    x 7 cardiac stents   Chronic back pain    L4/5 with chronic right leg pain    Chronic pain syndrome 11/24/2018   Chronic radicular lumbar pain (Right) 01/17/2019   Claudication in peripheral vascular disease (HCC) 01/07/2019   COVID-19    10/20/20   Dyspnea on exertion 08/17/2020   Floater, vitreous, left    Gastric nodule 05/31/2020   History of chronic ulcerative colitis 05/31/2020   History of lumbar surgery (x2) 01/17/2019   History of prostate cancer 02/10/2021   Hx of hepatitis C    treated with Harvoni in 2014   Hx of hepatitis C 12/23/2018   Hyperlipidemia    Hypertension    Intestinal metaplasia of gastric mucosa 12/23/2018   Leg edema 04/30/2021   Leg edema, right 05/03/2019   Lumbar radiculopathy, right 04/30/2021   Malignant neoplasm of prostate (  HCC) 03/28/2019   Mid back pain 04/30/2021   Neuromuscular disorder (HCC)    nerve damage in back   Neuropathy    tingling in toes @ times   Other hemorrhoids 12/23/2018   Palpitations 02/10/2021   Prostate cancer (HCC)    fall 2022, radiation therapy done dr Kathrynn Running   PVD (peripheral vascular disease) (HCC)    2 stents right leg and 1 in left    Rectal urgency 12/23/2018   Right hip pain 04/30/2021   Right leg weakness 05/03/2019   Right wrist pain 04/30/2021   S/P insertion of penile implant 11/11/2021   Subepithelial gastric lesion 03/25/2021   Ulcerative chronic  pancolitis without complications (HCC) 12/23/2018   Ulcerative colitis (HCC)    in remission since fall of 2022   Ulcerative colitis with complication (HCC) 11/24/2018   Urinary incontinence 11/24/2018   Weakness of both lower extremities 04/30/2021   Weight gain 03/25/2021    Past Surgical History:  Procedure Laterality Date   back surgery     x 2 lumbar last 09/2016 in [place]   BIOPSY  06/02/2021   Procedure: BIOPSY;  Surgeon: Lemar Lofty., MD;  Location: [facility];  Service: Gastroenterology;;  EGD and COLON   CARDIAC CATHETERIZATION     CATARACT EXTRACTION, BILATERAL     COLONOSCOPY     last 09/2018 in [place]   COLONOSCOPY WITH PROPOFOL N/A 06/02/2021   Procedure: COLONOSCOPY WITH PROPOFOL;  Surgeon: Lemar Lofty., MD;  Location: [facility];  Service: Gastroenterology;  Laterality: N/A;   CORONARY BALLOON ANGIOPLASTY N/A 03/21/2020   Procedure: CORONARY BALLOON ANGIOPLASTY;  Surgeon: Yvonne Kendall, MD;  Location: [facility];  Service: Cardiovascular;  Laterality: N/A;   CORONARY PRESSURE/FFR STUDY N/A 03/21/2020   Procedure: INTRAVASCULAR PRESSURE WIRE/FFR STUDY;  Surgeon: Yvonne Kendall, MD;  Location: [facility];  Service: Cardiovascular;  Laterality: N/A;   ESOPHAGOGASTRODUODENOSCOPY (EGD) WITH PROPOFOL N/A 06/02/2021   Procedure: ESOPHAGOGASTRODUODENOSCOPY (EGD) WITH PROPOFOL;  Surgeon: Meridee Score Netty Starring., MD;  Location: [facility];  Service: Gastroenterology;  Laterality: N/A;   HERNIA REPAIR     right    LEFT HEART CATH AND CORONARY ANGIOGRAPHY N/A 03/21/2020   Procedure: LEFT HEART CATH AND CORONARY ANGIOGRAPHY;  Surgeon: Yvonne Kendall, MD;  Location: [facility];  Service: Cardiovascular;  Laterality: N/A;   PENILE PROSTHESIS IMPLANT  1995   PERCUTANEOUS CORONARY STENT INTERVENTION (PCI-S)     x 7-8 heart   PERIPHERAL ARTERIAL STENT GRAFT     PROSTATE BIOPSY     pvd     with stenting x 2  right leg below knee and 1 left thigh    REMOVAL OF PENILE PROSTHESIS N/A 11/11/2021   Procedure: REMOVAL OF PENILE PROSTHESIS AND REPLACEMENT OF PENILE PROSTHESIS [employer];  Surgeon: Despina Arias, MD;  Location[facility];  Service: Urology;  Laterality: N/A;   SHOULDER ARTHROSCOPY Bilateral    TRIGGER FINGER RELEASE Left    UPPER ESOPHAGEAL ENDOSCOPIC ULTRASOUND (EUS) N/A 06/02/2021   Procedure: UPPER ESOPHAGEAL ENDOSCOPIC ULTRASOUND (EUS);  Surgeon: Lemar Lofty., MD;  Location: [facility];  Service: Gastroenterology;  Laterality: N/A;   VITRECTOMY Left    left eye    Family History  Problem Relation Age of Onset   Heart disease Mother    Stroke Mother    Breast cancer Mother    Diabetes Sister    Diabetes Brother    Colon cancer Neg Hx  Esophageal cancer Neg Hx    Inflammatory bowel disease Neg Hx    Liver disease Neg Hx    Pancreatic cancer Neg Hx    Rectal cancer Neg Hx    Stomach cancer Neg Hx     Social History   Socioeconomic History   Marital status: Married    Spouse name: Britta Mccreedy   Number of children: 2   Years of education: Not on file   Highest education level: GED or equivalent  Occupational History   Occupation: retired  Tobacco Use   Smoking status: Former    Current packs/day: 0.00    Average packs/day: 2.0 packs/day for 40.0 years (80.0 ttl pk-yrs)    Types: Cigarettes    Start date: 04/20/1958    Quit date: 04/20/1998    Years since quitting: 24.5   Smokeless tobacco: Never   Tobacco comments:    quit in 2000s   Vaping Use   Vaping status: Never Used  Substance and Sexual Activity   Alcohol use: Yes    Comment: occasionally   Drug use: Not Currently   Sexual activity: Yes  Other Topics Concern   Not on file  Social History Narrative   Moved from [place] in 10/2018   2 sons in 25s as of 11/24/2018    Married wife is DPR Britta Mccreedy x 45 years as of 04/2019   Social Determinants of Health    Financial Resource Strain: Low Risk  (10/12/2022)   Overall Financial Resource Strain (CARDIA)    Difficulty of Paying Living Expenses: Not hard at all  Food Insecurity: No Food Insecurity (10/12/2022)   Hunger Vital Sign    Worried About Running Out of Food in the Last Year: Never true    Ran Out of Food in the Last Year: Never true  Transportation Needs: No Transportation Needs (10/12/2022)   PRAPARE - Administrator, Civil Service (Medical): No    Lack of Transportation (Non-Medical): No  Physical Activity: Inactive (10/12/2022)   Exercise Vital Sign    Days of Exercise per Week: 0 days    Minutes of Exercise per Session: 0 min  Stress: No Stress Concern Present (10/12/2022)   [employer] of Occupational Health - Occupational Stress Questionnaire    Feeling of Stress : Not at all  Social Connections: Moderately Isolated (10/12/2022)   Social Connection and Isolation Panel [NHANES]    Frequency of Communication with Friends and Family: More than three times a week    Frequency of Social Gatherings with Friends and Family: More than three times a week    Attends Religious Services: Never    Database administrator or Organizations: No    Attends Banker Meetings: Never    Marital Status: Married  Catering manager Violence: Not At Risk (10/12/2022)   Humiliation, Afraid, Rape, and Kick questionnaire    Fear of Current or Ex-Partner: No    Emotionally Abused: No    Physically Abused: No    Sexually Abused: No    Outpatient Medications Prior to Visit  Medication Sig Dispense Refill   acetaminophen (TYLENOL) 500 MG tablet Take 500-1,000 mg by mouth every 6 (six) hours as needed (pain.).     albuterol (VENTOLIN HFA) 108 (90 Base) MCG/ACT inhaler INHALE 1-2 PUFFS BY MOUTH EVERY 6 HOURS AS NEEDED FOR WHEEZE OR SHORTNESS OF BREATH 18 each 0   amLODipine (NORVASC) 5 MG tablet Take 1 tablet (5 mg total) by mouth daily. D/c 10  mg dose (Patient not taking:  Reported on 10/12/2022) 90 tablet 3   aspirin EC 81 MG tablet Take 81 mg by mouth daily. Take while plavix is held per dr end     atorvastatin (LIPITOR) 40 MG tablet Take 1 tablet (40 mg total) by mouth daily. PLEASE SCHEDULE OFFICE VISIT FOR FURTHER REFILLS. THANK YOU! 30 tablet 0   clopidogrel (PLAVIX) 75 MG tablet Take 1 tablet (75 mg total) by mouth daily. 90 tablet 3   cyclobenzaprine (FLEXERIL) 5 MG tablet Take 5-10 mg by mouth at bedtime as needed for muscle spasms.     cycloSPORINE (RESTASIS) 0.05 % ophthalmic emulsion Place 1 drop into both eyes 2 (two) times daily.     furosemide (LASIX) 20 MG tablet TAKE 1 TABLET (20 MG TOTAL) BY MOUTH AS DIRECTED. TAKE 1 TABLET DAILY FOR ONE WEEK. 90 tablet 1   hyoscyamine (LEVBID) 0.375 MG 12 hr tablet TAKE 1 TABLET BY MOUTH EVERY 12 HOURS AS NEEDED 15 tablet 0   mesalamine (LIALDA) 1.2 g EC tablet TAKE 2 TABLETS BY MOUTH DAILY WITH BREAKFAST. 180 tablet 3   metoprolol succinate (TOPROL-XL) 100 MG 24 hr tablet TAKE 1 TABLET BY MOUTH EVERY DAY WITH OR IMMEDIATELY FOLLOWING A MEAL 90 tablet 1   nitroGLYCERIN (NITROSTAT) 0.4 MG SL tablet TAKE 1 TABLET BY MOUTH SUBLINGUALLY EVERY 5 MINS AS NEEDED FOR CHEST PAIN. IF >1 NEEDED, CALL 911 75 tablet 4   potassium chloride (MICRO-K) 10 MEQ CR capsule Take 2 capsules (20 mEq total) by mouth daily. D/c tablet (Patient not taking: Reported on 10/12/2022) 180 capsule 3   telmisartan-hydrochlorothiazide (MICARDIS HCT) 80-12.5 MG tablet Take 1 tablet by mouth daily. In am d/c 40-12.5 mg dose 90 tablet 3   oxyCODONE (OXY IR/ROXICODONE) 5 MG immediate release tablet Take 5 mg by mouth every 6 (six) hours as needed for moderate pain. (Patient not taking: Reported on 10/12/2022)     oxyCODONE ER (XTAMPZA ER) 9 MG C12A Take 9 mg by mouth every 12 (twelve) hours as needed. (Patient not taking: Reported on 10/12/2022)     pregabalin (LYRICA) 50 MG capsule Take 50 mg by mouth at bedtime. (Patient not taking: Reported on 10/12/2022)      No facility-administered medications prior to visit.    Allergies  Allergen Reactions   Cymbalta [Duloxetine Hcl] Other (See Comments)    drowsiness    ROS See HPI    Objective:    Physical Exam  There were no vitals taken for this visit. Wt Readings from Last 3 Encounters:  10/12/22 195 lb (88.5 kg)  04/29/22 195 lb 9.6 oz (88.7 kg)  04/22/22 194 lb 8 oz (88.2 kg)   GENERAL: alert, oriented, appears well and in no acute distress   HEENT: atraumatic, conjunttiva clear, no obvious abnormalities on inspection of external nose and ears   NECK: normal movements of the head and neck   LUNGS: on inspection no signs of respiratory distress, breathing rate appears normal, no obvious gross SOB, gasping or wheezing   CV: no obvious cyanosis   MS: moves all visible extremities without noticeable abnormality   PSYCH/NEURO: pleasant and cooperative, no obvious depression or anxiety, speech and thought processing grossly intact      Assessment & Plan:   Problem List Items Addressed This Visit       Endocrine   Goiter    Patient believes goiter/nodules have gotten larger. Difficulty with swallowing recently. He does not remember if he has been  evaluated by Endocrinology. Phone number provided to [facility] in [place] where patient was referred and scheduled for evaluation in March. He will contact them for follow up or to set up an evaluation. He will let me or his PCP know if he needs a new referral or evaluation by GI.         Nervous and Auditory   Chronic right-sided low back pain with right-sided sciatica - Primary    Chronic issue for many years. Hx of 2 back surgeries. Used to be followed by pain management, unable to afford medications. Has been without pain medications since January. PDMP reviewed. Referral placed for patient to establish with new pain management group.       Relevant Orders   Ambulatory referral to Pain Clinic    I have discontinued [facility]thias Hughs [facility] ER, pregabalin, and oxyCODONE. I am also having him maintain his nitroGLYCERIN, potassium chloride, acetaminophen, hyoscyamine, amLODipine, aspirin EC, clopidogrel, furosemide, mesalamine, cycloSPORINE, metoprolol succinate, cyclobenzaprine, telmisartan-hydrochlorothiazide, albuterol, and atorvastatin.  No orders of the defined types were placed in this encounter.   I discussed the assessment and treatment plan with the patient. The patient was provided an opportunity to ask questions and all were answered. The patient agreed with the plan and demonstrated an understanding of the instructions.   The patient was advised to call back or seek an in-person evaluation if the symptoms worsen or if the condition fails to improve as anticipated.   Bethanie Dicker, NP [facility] at [facility] [phone] (phone) [phone] (fax)  [facility]

## 2022-11-03 NOTE — Telephone Encounter (Signed)
I called and spoke with pt in regards to his 4pm Video Visit with Bethanie Dicker, NP.   I asked pt was he okay with having his appt a lot sooner than 4pm as we would squeeze him in sooner. Pt informed me that that would great bt he was about 30 minutes from home.   I told him to call the office back once he got home and was ready and I would get him set up.   Pt verbalized understanding

## 2022-11-09 ENCOUNTER — Encounter: Payer: Self-pay | Admitting: Physician Assistant

## 2022-11-09 ENCOUNTER — Ambulatory Visit: Payer: Medicare Other | Attending: Physician Assistant | Admitting: Physician Assistant

## 2022-11-09 VITALS — BP 132/70 | HR 71 | Ht 67.0 in | Wt 195.0 lb

## 2022-11-09 DIAGNOSIS — I10 Essential (primary) hypertension: Secondary | ICD-10-CM | POA: Diagnosis present

## 2022-11-09 DIAGNOSIS — I739 Peripheral vascular disease, unspecified: Secondary | ICD-10-CM | POA: Insufficient documentation

## 2022-11-09 DIAGNOSIS — I5032 Chronic diastolic (congestive) heart failure: Secondary | ICD-10-CM | POA: Insufficient documentation

## 2022-11-09 DIAGNOSIS — E785 Hyperlipidemia, unspecified: Secondary | ICD-10-CM | POA: Insufficient documentation

## 2022-11-09 DIAGNOSIS — I2510 Atherosclerotic heart disease of native coronary artery without angina pectoris: Secondary | ICD-10-CM | POA: Insufficient documentation

## 2022-11-09 NOTE — Progress Notes (Signed)
Cardiology Office Note    Date:  11/09/2022   ID:  Erik Nicholson, Erik Nicholson 05-30-1950, MRN [id number]  PCP:  Dana Allan, MD  Cardiologist:  Yvonne Kendall, MD  Electrophysiologist:  None   Chief Complaint: Follow up  History of Present Illness:   Erik Nicholson is a 72 y.o. male with history of CAD status post multiple PCI's, HFpEF, PVD status post bilateral lower extremity stenting followed by vascular surgery, ulcerative colitis, COVID infection in 10/2020, hepatitis C, probable COPD, HTN, HLD, prostate cancer, and BPH who presents for follow-up of CAD and HFpEF.   With regards to his CAD and PVD, notes indicate he has a history of 7 coronary and 3 lower extremity stents while living in the [place] area.  Most recent LHC in 03/2020 demonstrated severe single-vessel CAD with up to 75% ISR of the mid/distal RCA stent as well as 60 to 70% ostial stenosis of RPDA and RPL 1.  There was mild to moderate noncritical left coronary disease with most severe lesion located in a small to moderate caliber D1 branch that was jailed by prior LAD stent with up to 70% stenosis.  Normal LV systolic function with mildly elevated filling pressures.  He underwent PTCA of the mid/distal RCA with reduction in stenosis from 75% to 20% with TIMI-3 flow.  Stent placement was not attempted due to multiple layers of stent already present and incomplete expansion of a 3.5 mm noncompliant balloon at 22 ATM.  With percutaneous intervention he did note improvement in chest pain, though did continue to have significant exertional dyspnea.  PFTs showed probable small obstructive airway disease with bronchodilator response, for which he is followed by pulmonology.  Echo from 05/14/2020 showed an EF of 55 to 60%, no regional wall motion abnormalities, mild LVH, grade 1 diastolic dysfunction, normal RV systolic function and ventricular cavity size, trivial mitral regurgitation, and an estimated right atrial pressure of 3 mmHg.    PFTs in 05/2020 showed evidence of obstructive airway disease.   He was seen in the office on 11/18/2020 and continued to note chronic exertional dyspnea with activity such as climbing stairs or walking at a brisk pace.  Dyspnea improved with previously prescribed prednisone.  He was euvolemic.  Given symptoms, he underwent Lexiscan MPI on 11/25/2020 which showed no significant ischemia and was a low risk study with an EF of 55 to 65%.   Zio patch in 02/2021 showed a predominant rhythm of sinus with an average rate of 77 bpm (range 52 to 118 bpm in sinus), a single 4 beat run of NSVT occurred with a maximal rate of 193 bpm, 6 atrial runs lasting up to 7 beats with a maximal rate of 139 bpm, rare PACs and PVCs, and no sustained arrhythmias or prolonged pauses.  Patient triggered events corresponded to sinus rhythm and PACs.   He was seen in the office in 09/2021 and was without symptoms of angina or decompensation.  He had noted some heaviness in his right lower extremity that has been present for many years.  He also noted mild lower extremity swelling that was stable and did not improve much with prior escalation of furosemide to daily dosing.  His shortness of breath was minimal given correct Spiriva use on a daily basis.  His furosemide was changed back to as needed dosing.  He was felt to be acceptable risk for penile prosthesis revision from a cardiac perspective without further testing or intervention.  He was last  seen in the office in 04/2022 and reported his chronic dyspnea was stable.  He was no longer on Spiriva, indicating this did not help his breathing.  He requested refill of albuterol.  He was taking furosemide 20 mg on most days.  He underwent echo on 05/01/2022 that showed an EF of 55 to 60%, no regional wall motion abnormalities, grade 1 diastolic dysfunction, normal RV systolic function, ventricular cavity size, and PASP, no significant valvular abnormalities, and a normal CVP.  He comes in  he is doing well without symptoms of angina or cardiac decompensation.  Chronic dyspnea is stable and improves with albuterol inhaler usage.  No progressive lower extremity swelling or orthopnea.  No early satiety.  No dizziness, presyncope, or syncope.  No lifestyle limiting claudication or nonhealing wounds along the lower extremities.  Has not needed any furosemide.  Has not been on amlodipine for several months with blood pressure in the 130s systolic.   Labs independently reviewed: 10/2022 - direct LDL 59, TC 151, TG 253, HDL 47, potassium 3.5, BUN 14, serum creatinine 1.08, albumin 4.3, AST/ALT normal, Hgb 13.2, PLT 239, A1c 5.7 04/2021 - TSH normal  Past Medical History:  Diagnosis Date   Abdominal gas pain 12/23/2018   Abnormal gait 04/30/2021   Abnormal liver diagnostic imaging 09/16/2021   Abnormal MRI, lumbar spine 05/09/2021   Abnormal MRI, thoracic spine 05/09/2021   Abnormal weight loss 11/24/2018   Accelerating angina (HCC) 03/15/2020   Antiplatelet or antithrombotic long-term use 12/23/2018   Arthritis    both hands   Arthritis of right hip 05/05/2021   Benign prostatic hyperplasia with post-void dribbling 11/24/2018   BPH (benign prostatic hyperplasia)    CAD (coronary artery disease)    x 7 cardiac stents   Chronic back pain    L4/5 with chronic right leg pain    Chronic pain syndrome 11/24/2018   Chronic radicular lumbar pain (Right) 01/17/2019   Claudication in peripheral vascular disease (HCC) 01/07/2019   COVID-19    10/20/20   Dyspnea on exertion 08/17/2020   Floater, vitreous, left    Gastric nodule 05/31/2020   History of chronic ulcerative colitis 05/31/2020   History of lumbar surgery (x2) 01/17/2019   History of prostate cancer 02/10/2021   Hx of hepatitis C    treated with Harvoni in 2014   Hx of hepatitis C 12/23/2018   Hyperlipidemia    Hypertension    Intestinal metaplasia of gastric mucosa 12/23/2018   Leg edema 04/30/2021   Leg edema, right  05/03/2019   Lumbar radiculopathy, right 04/30/2021   Malignant neoplasm of prostate (HCC) 03/28/2019   Mid back pain 04/30/2021   Neuromuscular disorder (HCC)    nerve damage in back   Neuropathy    tingling in toes @ times   Other hemorrhoids 12/23/2018   Palpitations 02/10/2021   Prostate cancer (HCC)    fall 2022, radiation therapy done dr Kathrynn Running   PVD (peripheral vascular disease) (HCC)    2 stents right leg and 1 in left    Rectal urgency 12/23/2018   Right hip pain 04/30/2021   Right leg weakness 05/03/2019   Right wrist pain 04/30/2021   S/P insertion of penile implant 11/11/2021   Subepithelial gastric lesion 03/25/2021   Ulcerative chronic pancolitis without complications (HCC) 12/23/2018   Ulcerative colitis (HCC)    in remission since fall of 2022   Ulcerative colitis with complication (HCC) 11/24/2018   Urinary incontinence 11/24/2018   Weakness of both  lower extremities 04/30/2021   Weight gain 03/25/2021    Past Surgical History:  Procedure Laterality Date   back surgery     x 2 lumbar last 09/2016 in [place]   BIOPSY  06/02/2021   Procedure: BIOPSY;  Surgeon: Lemar Lofty., MD;  Location: [facility];  Service: Gastroenterology;;  EGD and COLON   CARDIAC CATHETERIZATION     CATARACT EXTRACTION, BILATERAL     COLONOSCOPY     last 09/2018 in [place]   COLONOSCOPY WITH PROPOFOL N/A 06/02/2021   Procedure: COLONOSCOPY WITH PROPOFOL;  Surgeon: Lemar Lofty., MD;  Location: [facility];  Service: Gastroenterology;  Laterality: N/A;   CORONARY BALLOON ANGIOPLASTY N/A 03/21/2020   Procedure: CORONARY BALLOON ANGIOPLASTY;  Surgeon: Yvonne Kendall, MD;  Location: [facility];  Service: Cardiovascular;  Laterality: N/A;   CORONARY PRESSURE/FFR STUDY N/A 03/21/2020   Procedure: INTRAVASCULAR PRESSURE WIRE/FFR STUDY;  Surgeon: Yvonne Kendall, MD;  Location: [facility];  Service: Cardiovascular;  Laterality: N/A;    ESOPHAGOGASTRODUODENOSCOPY (EGD) WITH PROPOFOL N/A 06/02/2021   Procedure: ESOPHAGOGASTRODUODENOSCOPY (EGD) WITH PROPOFOL;  Surgeon: Meridee Score Netty Starring., MD;  Location: [facility];  Service: Gastroenterology;  Laterality: N/A;   HERNIA REPAIR     right    LEFT HEART CATH AND CORONARY ANGIOGRAPHY N/A 03/21/2020   Procedure: LEFT HEART CATH AND CORONARY ANGIOGRAPHY;  Surgeon: Yvonne Kendall, MD;  Location: [facility];  Service: Cardiovascular;  Laterality: N/A;   PENILE PROSTHESIS IMPLANT  1995   PERCUTANEOUS CORONARY STENT INTERVENTION (PCI-S)     x 7-8 heart   PERIPHERAL ARTERIAL STENT GRAFT     PROSTATE BIOPSY     pvd     with stenting x 2 right leg below knee and 1 left thigh    REMOVAL OF PENILE PROSTHESIS N/A 11/11/2021   Procedure: REMOVAL OF PENILE PROSTHESIS AND REPLACEMENT OF PENILE PROSTHESIS [employer];  Surgeon: Despina Arias, MD;  Location: [facility];  Service: Urology;  Laterality: N/A;   SHOULDER ARTHROSCOPY Bilateral    TRIGGER FINGER RELEASE Left    UPPER ESOPHAGEAL ENDOSCOPIC ULTRASOUND (EUS) N/A 06/02/2021   Procedure: UPPER ESOPHAGEAL ENDOSCOPIC ULTRASOUND (EUS);  Surgeon: Lemar Lofty., MD;  Location: [facility];  Service: Gastroenterology;  Laterality: N/A;   VITRECTOMY Left    left eye    Current Medications: Current Meds  Medication Sig   acetaminophen (TYLENOL) 500 MG tablet Take 500-1,000 mg by mouth every 6 (six) hours as needed (pain.).   albuterol (VENTOLIN HFA) 108 (90 Base) MCG/ACT inhaler INHALE 1-2 PUFFS BY MOUTH EVERY 6 HOURS AS NEEDED FOR WHEEZE OR SHORTNESS OF BREATH   aspirin EC 81 MG tablet Take 81 mg by mouth daily. Take while plavix is held per dr end   atorvastatin (LIPITOR) 40 MG tablet Take 1 tablet (40 mg total) by mouth daily. PLEASE SCHEDULE OFFICE VISIT FOR FURTHER REFILLS. THANK YOU!   clopidogrel (PLAVIX) 75 MG tablet Take 1 tablet (75 mg total) by mouth daily.   cyclobenzaprine  (FLEXERIL) 5 MG tablet Take 5-10 mg by mouth at bedtime as needed for muscle spasms.   cycloSPORINE (RESTASIS) 0.05 % ophthalmic emulsion Place 1 drop into both eyes 2 (two) times daily.   furosemide (LASIX) 20 MG tablet TAKE 1 TABLET (20 MG TOTAL) BY MOUTH AS DIRECTED. TAKE 1 TABLET DAILY FOR ONE WEEK.   mesalamine (LIALDA) 1.2 g EC tablet TAKE 2 TABLETS BY MOUTH DAILY WITH BREAKFAST.  metoprolol succinate (TOPROL-XL) 100 MG 24 hr tablet TAKE 1 TABLET BY MOUTH EVERY DAY WITH OR IMMEDIATELY FOLLOWING A MEAL   nitroGLYCERIN (NITROSTAT) 0.4 MG SL tablet TAKE 1 TABLET BY MOUTH SUBLINGUALLY EVERY 5 MINS AS NEEDED FOR CHEST PAIN. IF >1 NEEDED, CALL 911   telmisartan-hydrochlorothiazide (MICARDIS HCT) 80-12.5 MG tablet Take 1 tablet by mouth daily. In am d/c 40-12.5 mg dose    Allergies:   Cymbalta [duloxetine hcl]   Social History   Socioeconomic History   Marital status: Married    Spouse name: Britta Mccreedy   Number of children: 2   Years of education: Not on file   Highest education level: GED or equivalent  Occupational History   Occupation: retired  Tobacco Use   Smoking status: Former    Current packs/day: 0.00    Average packs/day: 2.0 packs/day for 40.0 years (80.0 ttl pk-yrs)    Types: Cigarettes    Start date: 04/20/1958    Quit date: 04/20/1998    Years since quitting: 24.5   Smokeless tobacco: Never   Tobacco comments:    quit in 2000s   Vaping Use   Vaping status: Never Used  Substance and Sexual Activity   Alcohol use: Yes    Comment: occasionally   Drug use: Not Currently   Sexual activity: Yes  Other Topics Concern   Not on file  Social History Narrative   Moved from [place] in 10/2018   2 sons in 20s as of 11/24/2018    Married wife is DPR Britta Mccreedy x 45 years as of 04/2019   Social Determinants of Health   Financial Resource Strain: Low Risk  (10/12/2022)   Overall Financial Resource Strain (CARDIA)    Difficulty of Paying Living Expenses: Not hard at all   Food Insecurity: No Food Insecurity (10/12/2022)   Hunger Vital Sign    Worried About Running Out of Food in the Last Year: Never true    Ran Out of Food in the Last Year: Never true  Transportation Needs: No Transportation Needs (10/12/2022)   PRAPARE - Administrator, Civil Service (Medical): No    Lack of Transportation (Non-Medical): No  Physical Activity: Inactive (10/12/2022)   Exercise Vital Sign    Days of Exercise per Week: 0 days    Minutes of Exercise per Session: 0 min  Stress: No Stress Concern Present (10/12/2022)   [employer] of Occupational Health - Occupational Stress Questionnaire    Feeling of Stress : Not at all  Social Connections: Moderately Isolated (10/12/2022)   Social Connection and Isolation Panel [NHANES]    Frequency of Communication with Friends and Family: More than three times a week    Frequency of Social Gatherings with Friends and Family: More than three times a week    Attends Religious Services: Never    Database administrator or Organizations: No    Attends Engineer, structural: Never    Marital Status: Married     Family History:  The patient's family history includes Breast cancer in his mother; Diabetes in his brother and sister; Heart disease in his mother; Stroke in his mother. There is no history of Colon cancer, Esophageal cancer, Inflammatory bowel disease, Liver disease, Pancreatic cancer, Rectal cancer, or Stomach cancer.  ROS:   12-point review of systems is negative unless otherwise noted in the HPI.   EKGs/Labs/Other Studies Reviewed:    Studies reviewed were summarized above. The additional studies were reviewed today:  2D echo 05/01/2022: 1. Left ventricular ejection fraction, by estimation, is 55 to 60%. The  left ventricle has normal function. The left ventricle has no regional  wall motion abnormalities. Left ventricular diastolic parameters are  consistent with Grade I diastolic  dysfunction  (impaired relaxation). The average left ventricular global  longitudinal strain is -16.4 %.   2. Right ventricular systolic function is normal. The right ventricular  size is normal. There is normal pulmonary artery systolic pressure. The  estimated right ventricular systolic pressure is 20.3 mmHg.   3. The mitral valve is normal in structure. No evidence of mitral valve  regurgitation. No evidence of mitral stenosis.   4. The aortic valve is tricuspid. Aortic valve regurgitation is not  visualized. No aortic stenosis is present.   5. The inferior vena cava is normal in size with greater than 50%  respiratory variability, suggesting right atrial pressure of 3 mmHg.  __________  [facility] patch 02/2021: The patient was monitored for 12 days, 23 hours. Predominant rhythm was sinus with an average rate of 77 bpm (range 52-118 bpm in sinus). There were rare PACs and PVCs. A single 4 beat run of nonsustained ventricular tachycardia occurred, with a maximum rate of 193 bpm. There were six atrial runs lasting up to 7 beats with a maximum rate of 139 bpm. No sustained arrhythmia or prolonged pause was observed. Patient triggered events correspond to sinus rhythm and PACs.   Predominantly sinus rhythm with rare PACs and PVCs.  A few brief episodes of PSVT and NSVT occurred. __________  Eugenie Birks MPI 11/2020: ST segment elevation was noted during stress. No T wave inversion was noted during stress. The study is normal. This is a low risk study. The left ventricular ejection fraction is normal (55-65%). CT attenuation images showed significant coronary calcifications and mild aortic calcifications. __________   2D echo 05/14/2020: 1. Left ventricular ejection fraction, by estimation, is 55 to 60%. The  left ventricle has normal function. The left ventricle has no regional  wall motion abnormalities. There is mild left ventricular hypertrophy.  Left ventricular diastolic parameters  are consistent  with Grade I diastolic dysfunction (impaired relaxation).  The average left ventricular global longitudinal strain is -16.3 %.   2. Right ventricular systolic function is normal. The right ventricular  size is normal.   3. The mitral valve is grossly normal. Trivial mitral valve  regurgitation. No evidence of mitral stenosis.   4. The aortic valve was not well visualized. Aortic valve regurgitation  is not visualized. No aortic stenosis is present.   5. The inferior vena cava is normal in size with greater than 50%  respiratory variability, suggesting right atrial pressure of 3 mmHg. __________   LHC 03/21/2020: Conclusions: Severe single-vessel coronary artery disease with up to 75% in-stent restenosis of the mid/distal RCA (FFR 0.70), as well as 60-70% ostial stenoses of rPDA and rPL1. Mild to moderate, non-critical left coronary artery disease.  The most severe lesion is a small to moderate caliber D1 branch that is jailed by LAD stent with up to 70% stenosis. Normal left ventricular systolic function with mildly elevated filling pressure. Successful PTCA of mid/distal RCA with reduction in stenosis from 75% to 20% with TIMI-3 flow.  Stent placement not attempted due to multiple layers of stent already present and incomplete expansion of 3.5 mm non-compliant balloon at 22 atm.   Recommendations: Continue aggressive secondary prevention. Indefinite dual antiplatelet therapy with aspirin and clopidogrel; discontinuation of aspirin could be considered  after 6 months of therapy. Anticipate same-day discharge if no post-catheterization complication occur. __________   Eugenie Birks MPI 03/07/2019: Normal pharmacologic myocardial perfusion stress test without significant ischemia or scar. The left ventricular ejection fraction is normal by visual estimation and Siemens calculation (56%). Dense coronary artery calcifications and/or prior coronary stents are noted on the attenuation correction  CT. This is a low risk study. __________   2D echo 03/02/2019: 1. Left ventricular ejection fraction, by visual estimation, is 60 to  65%. The left ventricle has normal function. There is no left ventricular  hypertrophy.   2. Left ventricular diastolic parameters are consistent with Grade I  diastolic dysfunction (impaired relaxation).   3. Global right ventricle has normal systolic function.The right  ventricular size is normal. No increase in right ventricular wall  thickness.   4. Left atrial size was normal.   5. Normal pulmonary artery systolic pressure. __________   [facility] 02/25/2012 [facility]): Patent stents in the proximal and mid RCA, 40% mid RCA stenosis, normal left heart pressures, normal LV systolic function   EKG:  EKG is ordered today.  The EKG ordered today demonstrates NSR, 71 bpm, prior inferior infarct, no acute ST-T changes, consistent with prior tracing with improved PR interval  Recent Labs: 10/26/2022: ALT 25; BUN 14; Creatinine, Ser 1.08; Hemoglobin 13.2; Platelets 239.0; Potassium 3.5; Sodium 142  Recent Lipid Panel    Component Value Date/Time   CHOL 151 10/26/2022 0730   TRIG 253.0 (H) 10/26/2022 0730   HDL 47.60 10/26/2022 0730   CHOLHDL 3 10/26/2022 0730   VLDL 50.6 (H) 10/26/2022 0730   LDLCALC 45 01/30/2020 0803   LDLDIRECT 59.0 10/26/2022 0730    PHYSICAL EXAM:    VS:  BP 132/70 (BP Location: Left Arm, Patient Position: Sitting, Cuff Size: Normal)   Pulse 71   Ht 5\' 7"  (1.702 m)   Wt 195 lb (88.5 kg)   BMI 30.54 kg/m   BMI: Body mass index is 30.54 kg/m.  Physical Exam Vitals reviewed.  Constitutional:      Appearance: He is well-developed.  HENT:     Head: Normocephalic and atraumatic.  Eyes:     General:        Right eye: No discharge.        Left eye: No discharge.  Neck:     Vascular: No JVD.  Cardiovascular:     Rate and Rhythm: Normal rate and regular rhythm.     Heart sounds: Normal heart sounds, S1  normal and S2 normal. Heart sounds not distant. No midsystolic click and no opening snap. No murmur heard.    No friction rub.  Pulmonary:     Effort: Pulmonary effort is normal. No respiratory distress.     Breath sounds: Normal breath sounds. No decreased breath sounds, wheezing or rales.  Chest:     Chest wall: No tenderness.  Abdominal:     General: There is no distension.  Musculoskeletal:     Cervical back: Normal range of motion.     Right lower leg: No edema.     Left lower leg: No edema.  Skin:    General: Skin is warm and dry.     Nails: There is no clubbing.  Neurological:     Mental Status: He is alert and oriented to person, place, and time.  Psychiatric:        Speech: Speech normal.        Behavior: Behavior normal.  Thought Content: Thought content normal.        Judgment: Judgment normal.     Wt Readings from Last 3 Encounters:  11/09/22 195 lb (88.5 kg)  10/12/22 195 lb (88.5 kg)  04/29/22 195 lb 9.6 oz (88.7 kg)     ASSESSMENT & PLAN:   CAD involving the native coronary arteries without angina with chronic dyspnea: He is doing well and without symptoms of angina or cardiac decompensation.  He notes a longstanding history of exertional dyspnea that is stable and improved with albuterol.  Continue aggressive risk factor modification and secondary prevention including aspirin, clopidogrel, metoprolol, and atorvastatin.  HFpEF: Euvolemic and well compensated with NYHA class II-III symptoms.  Not needing as needed furosemide.  Defer addition of SGLT2 inhibitor/MRA in the setting of prior hypotension.  However, should he develop heart failure symptoms moving forward would look to escalate GDMT.  HTN: Blood pressure is well-controlled in the office.  He remains on Toprol-XL and Micardis HCTZ.  HLD: LDL 59 earlier this month.  Remains on atorvastatin 40 mg.  PAD: No symptoms concerning for claudication. Followed by vascular surgery. He remains on  atorvastatin and aspirin.     Disposition: F/u with Dr. Okey Dupre or an APP in 6 months.   Medication Adjustments/Labs and Tests Ordered: Current medicines are reviewed at length with the patient today.  Concerns regarding medicines are outlined above. Medication changes, Labs and Tests ordered today are summarized above and listed in the Patient Instructions accessible in Encounters.   Signed, Eula Listen, PA-C 11/09/2022 2:40 PM     [facility] [place] [address] [place], [place] [address] [phone]

## 2022-11-09 NOTE — Patient Instructions (Signed)
Medication Instructions:  No changes at this time.   *If you need a refill on your cardiac medications before your next appointment, please call your pharmacy*   Lab Work: None  If you have labs (blood work) drawn today and your tests are completely normal, you will receive your results only by: MyChart Message (if you have MyChart) OR A paper copy in the mail If you have any lab test that is abnormal or we need to change your treatment, we will call you to review the results.   Testing/Procedures: None   Follow-Up: At [facility], you and your health needs are our priority.  As part of our continuing mission to provide you with exceptional heart care, we have created designated Provider Care Teams.  These Care Teams include your primary Cardiologist (physician) and Advanced Practice Providers (APPs -  Physician Assistants and Nurse Practitioners) who all work together to provide you with the care you need, when you need it.  Your next appointment:   6 month(s)  Provider:   Christopher End, MD or Ryan Dunn, PA-C     

## 2022-11-13 ENCOUNTER — Telehealth: Payer: Self-pay

## 2022-11-13 NOTE — Telephone Encounter (Signed)
Call was handled please see unread my chart message.

## 2022-11-13 NOTE — Telephone Encounter (Signed)
Pt returned Elizabeth CMA call. Transferred. 

## 2022-11-13 NOTE — Telephone Encounter (Signed)
Left message to call back regarding unread my chart message from Dr. Clent Ridges:  Triglycerides high.  Recommend monitoring diet and increase activity.     MCV is high without anemia.  Recommend further workup.  Please schedule appointment to discuss further or referral to hematology.

## 2022-11-25 ENCOUNTER — Other Ambulatory Visit: Payer: Self-pay | Admitting: Internal Medicine

## 2022-11-27 ENCOUNTER — Encounter: Payer: Self-pay | Admitting: Family Medicine

## 2022-11-27 ENCOUNTER — Ambulatory Visit: Payer: Medicare Other | Admitting: Family Medicine

## 2022-11-27 VITALS — BP 150/80 | HR 82 | Temp 98.0°F | Resp 16 | Ht 67.0 in | Wt 190.0 lb

## 2022-11-27 DIAGNOSIS — R251 Tremor, unspecified: Secondary | ICD-10-CM

## 2022-11-27 DIAGNOSIS — G8929 Other chronic pain: Secondary | ICD-10-CM | POA: Diagnosis not present

## 2022-11-27 LAB — CBC
HCT: 37.5 % — ABNORMAL LOW (ref 38.5–50.0)
Hemoglobin: 13.1 g/dL — ABNORMAL LOW (ref 13.2–17.1)
MCH: 34.8 pg — ABNORMAL HIGH (ref 27.0–33.0)
MCHC: 34.9 g/dL (ref 32.0–36.0)
MCV: 99.7 fL (ref 80.0–100.0)
MPV: 12 fL (ref 7.5–12.5)
Platelets: 233 10*3/uL (ref 140–400)
RBC: 3.76 10*6/uL — ABNORMAL LOW (ref 4.20–5.80)
RDW: 11.8 % (ref 11.0–15.0)
WBC: 6.3 10*3/uL (ref 3.8–10.8)

## 2022-11-27 NOTE — Progress Notes (Signed)
SUBJECTIVE:   Chief Complaint  Patient presents with   Back Pain    Can't walk, and stand for long period of time had for over 30 years.   HPI Patient presents to clinic requesting referral to pain management  Patient reports was taking Xtampza for pain in back but has been weaned off medication for the past few months. Was previously seeing Dr Jac Canavan in [place].  He is requesting referral to pain management as he has issues with walking and standing that has been ongoing for a number of years.  Has not had any worsening of symptoms but has not improved.  Denies any fevers, bowel/bladder incontinence or saddle anesthesia.  Requesting Neurology referral for uncontrollable shakes.   Reports intermittent tremors of entire body that started in May or June.  Reports generalized body shakes.  No LOC, or bowel/bladder function. Can recall events leading up to and after episodes.  Cannot pinpoint triggers.  Reports feels like in a vibrating massage chair. Episodes self resolve.  PERTINENT PMH / PSH: CAD status post PCI x 7 stents HFpEF PVD status post bilateral lower extremity stenting x 3 Hepatitis C COPD Hypertension Hyperlipidemia Prostate CA Goiter  OBJECTIVE:  BP (!) 150/80   Pulse 82   Temp 98 F (36.7 C)   Resp 16   Ht 5\' 7"  (1.702 m)   Wt 190 lb (86.2 kg)   SpO2 97%   BMI 29.76 kg/m    Physical Exam Constitutional:      General: He is not in acute distress.    Appearance: He is normal weight. He is not ill-appearing.  HENT:     Head: Normocephalic.  Eyes:     Conjunctiva/sclera: Conjunctivae normal.  Cardiovascular:     Rate and Rhythm: Normal rate and regular rhythm.     Pulses: Normal pulses.  Pulmonary:     Effort: Pulmonary effort is normal.     Breath sounds: Normal breath sounds.  Abdominal:     General: Bowel sounds are normal.  Neurological:     General: No focal deficit present.     Mental Status: He is alert and oriented to person, place, and  time. Mental status is at baseline.     Cranial Nerves: No cranial nerve deficit.     Sensory: No sensory deficit.     Motor: No weakness.     Coordination: Coordination normal.  Psychiatric:        Mood and Affect: Mood normal.        Behavior: Behavior normal.        Thought Content: Thought content normal.        Judgment: Judgment normal.     ASSESSMENT/PLAN:  Occasional tremors Assessment & Plan: Unknown etiology No focal deficits on neuro exam today Check labs for underlying etiology Referral to Neurology   Orders: -     Basic metabolic panel -     CBC -     Folate -     TSH -     Vitamin B12 -     Ambulatory referral to Neurology  Other chronic pain Assessment & Plan: Was followed by Dr Jac Canavan and treated with Marlowe Kays. Now discontinued Continues to have issues with chronic pain Provided pain management information.  Patient to call Dr Tamsen Snider for appointment     HCM Reports flu vaccine given at [facility] Recommend shingles and tetanus vaccine Declined pneumonia vaccine  PDMP reviewed  Return if symptoms worsen or fail to improve.  Dana Allan, MD

## 2022-11-27 NOTE — Patient Instructions (Addendum)
It was a pleasure meeting you today. Thank you for allowing me to take part in your health care.  Our goals for today as we discussed include:  Call to self refer   Pain Management Dr. Mikael Spray MD [facility] [address] [place], [place] [address] PHONE: [phone]  Neurology Address: [address], [place], [place] [address] Hours:  Open ? Closes 4:30?PM Phone: [phone]  We will get some labs today.  If they are abnormal or we need to do something about them, I will call you.  If they are normal, I will send you a message on MyChart (if it is active) or a letter in the mail.  If you don't hear from [place] in 2 weeks, please call the office at the number below.   Follow up with Cardiology for elevated blood pressure  Follow up as needed  If you have any questions or concerns, please do not hesitate to call the office at [phone].  I look forward to our next visit and until then take care and stay safe.  Regards,   Dana Allan, MD   [facility]

## 2022-11-30 ENCOUNTER — Telehealth: Payer: Self-pay | Admitting: Family Medicine

## 2022-11-30 ENCOUNTER — Other Ambulatory Visit: Payer: Self-pay | Admitting: Internal Medicine

## 2022-11-30 NOTE — Telephone Encounter (Signed)
Patient called and needs a referrel to see a neurologist. He would like to set up his appointment soon. He wants to go to the one on [address] in [place].

## 2022-12-01 LAB — LAB REPORT - SCANNED: HM Hepatitis Screen: NEGATIVE

## 2022-12-03 ENCOUNTER — Telehealth: Payer: Self-pay

## 2022-12-03 NOTE — Telephone Encounter (Signed)
Incoming fax was received from [facility] pt it has been placed  in provider to be reviewed folder. It states that the pt informed them he is already being treated by a pain management provider in [place]

## 2022-12-13 ENCOUNTER — Encounter: Payer: Self-pay | Admitting: Family Medicine

## 2022-12-13 DIAGNOSIS — G8929 Other chronic pain: Secondary | ICD-10-CM | POA: Insufficient documentation

## 2022-12-13 DIAGNOSIS — R251 Tremor, unspecified: Secondary | ICD-10-CM | POA: Insufficient documentation

## 2022-12-13 NOTE — Assessment & Plan Note (Signed)
Unknown etiology No focal deficits on neuro exam today Check labs for underlying etiology Referral to Neurology

## 2022-12-13 NOTE — Assessment & Plan Note (Signed)
Was followed by Dr Jac Canavan and treated with Marlowe Kays. Now discontinued Continues to have issues with chronic pain Provided pain management information.  Patient to call Dr Tamsen Snider for appointment

## 2022-12-17 ENCOUNTER — Other Ambulatory Visit (INDEPENDENT_AMBULATORY_CARE_PROVIDER_SITE_OTHER): Payer: Medicare Other

## 2022-12-17 ENCOUNTER — Encounter: Payer: Self-pay | Admitting: Physician Assistant

## 2022-12-17 ENCOUNTER — Ambulatory Visit: Payer: Medicare Other | Admitting: Physician Assistant

## 2022-12-17 VITALS — BP 138/80 | HR 61 | Ht 67.0 in | Wt 192.0 lb

## 2022-12-17 DIAGNOSIS — K5190 Ulcerative colitis, unspecified, without complications: Secondary | ICD-10-CM

## 2022-12-17 DIAGNOSIS — K7469 Other cirrhosis of liver: Secondary | ICD-10-CM

## 2022-12-17 DIAGNOSIS — Z8619 Personal history of other infectious and parasitic diseases: Secondary | ICD-10-CM

## 2022-12-17 LAB — PROTIME-INR
INR: 1.2 ratio — ABNORMAL HIGH (ref 0.8–1.0)
Prothrombin Time: 12.8 s (ref 9.6–13.1)

## 2022-12-17 NOTE — Patient Instructions (Addendum)
Your provider has requested that you go to the basement level for lab work before leaving today. Press "B" on the elevator. The lab is located at the first door on the left as you exit the elevator.   Continue Lialda.  You have been scheduled for an appointment with Dr. Meridee Score on 03/23/23 at 1:30 PM . Please arrive 10 minutes early for your appointment.  _______________________________________________________  If your blood pressure at your visit was 140/90 or greater, please contact your primary care physician to follow up on this.  _______________________________________________________  If you are age 96 or older, your body mass index should be between 23-30. Your Body mass index is 30.07 kg/m. If this is out of the aforementioned range listed, please consider follow up with your Primary Care Provider.  ________________________________________________________  The [facility] providers would like to encourage you to use [facility] to communicate with providers for non-urgent requests or questions.  Due to long hold times on the telephone, sending your provider a message by [facility] may be a faster and more efficient way to get a response.  Please allow 48 business hours for a response.  Please remember that this is for non-urgent requests.   Due to recent changes in healthcare laws, you may see the results of your imaging and laboratory studies on MyChart before your provider has had a chance to review them.  We understand that in some cases there may be results that are confusing or concerning to you. Not all laboratory results come back in the same time frame and the provider may be waiting for multiple results in order to interpret others.  Please give [place] 48 hours in order for your provider to thoroughly review all the results before contacting the office for clarification of your results.     Thank you for choosing me and [facility].  Sondra Come, PA-C

## 2022-12-17 NOTE — Progress Notes (Signed)
Subjective:    Patient ID: Erik Nicholson, male    DOB: Mar 21, 1951, 72 y.o.   MRN: [id number]  HPI Erik Nicholson is a pleasant 72 year old African-American male established with Dr. Meridee Nicholson who comes in today for follow-up and concern per recent labs done by his PCP at [facility] which showed a positive hepatitis C antibody. Patient had also just undergone recent follow-up abdominal ultrasound in July 2024 which did not show any evidence of gallstones, CBD 3 mm, noted nodular liver with increased echogenicity, portal vein patent and normal size spleen.  Patient is known to have history of hepatitis C status post eradication with Harvoni. He had hep C RNA quant done in 2023 which was negative.  Most recent labs July 2024 LFTs within normal limits, albumin 4.3/hemoglobin 13.2 and platelets 239. He had elastography June 2023 with median K PA of 10 suggestive  of underlying chronic liver disease. Patient does have history of previous fairly regular EtOH use,, says had a slip up recently but has gotten back to not drinking any alcohol on a regular basis.  Other medical problems include coronary artery disease status post multiple PCI's and stents, congestive heart failure with preserved EF, peripheral vascular disease status post lower extremity stents, history of ulcerative colitis which has been quiescent on low-dose Lialda. History of BPH, prostate cancer and hypertension.  Last colonoscopy done February 2023 was normal with the exception of internal and external hemorrhoids, biopsies from multiple areas of the colon showed no evidence of active inflammation. He had undergone EGD and EUS February 2023 done for gastric deformity on endoscopy -found to have extrinsic compression of the stomach secondary to a normal-appearing gallbladder, noted to have multiple small duodenal ulcers with clean base, and duodenitis which was biopsied.  Biopsies from the stomach showed no H. pylori and duodenal biopsies  showed duodenitis with focal erosions consistent with peptic injury.  Patient does not have any active symptoms today, says he has been feeling well but worried because of the positive hep C antibody.  He has no current complaints of abdominal pain, no colitis complaints specifically having normal bowel movements without any blood.  He says very occasionally he will have an episode of urgency. No upper GI symptoms, and is not currently on a PPI.    Review of Systems Pertinent positive and negative review of systems were noted in the above HPI section.  All other review of systems was otherwise negative.   Outpatient Encounter Medications as of 12/17/2022  Medication Sig   acetaminophen (TYLENOL) 500 MG tablet Take 500-1,000 mg by mouth every 6 (six) hours as needed (pain.).   albuterol (VENTOLIN HFA) 108 (90 Base) MCG/ACT inhaler INHALE 1-2 PUFFS BY MOUTH EVERY 6 HOURS AS NEEDED FOR WHEEZE OR SHORTNESS OF BREATH   aspirin EC 81 MG tablet Take 81 mg by mouth daily. Take while plavix is held per dr end   atorvastatin (LIPITOR) 40 MG tablet Take 1 tablet (40 mg total) by mouth daily.   clopidogrel (PLAVIX) 75 MG tablet Take 1 tablet (75 mg total) by mouth daily.   cyclobenzaprine (FLEXERIL) 5 MG tablet Take 5-10 mg by mouth at bedtime as needed for muscle spasms.   cycloSPORINE (RESTASIS) 0.05 % ophthalmic emulsion Place 1 drop into both eyes 2 (two) times daily.   mesalamine (LIALDA) 1.2 g EC tablet TAKE 2 TABLETS BY MOUTH DAILY WITH BREAKFAST.   metoprolol succinate (TOPROL-XL) 100 MG 24 hr tablet TAKE 1 TABLET BY MOUTH EVERY DAY  WITH OR IMMEDIATELY FOLLOWING A MEAL   nitroGLYCERIN (NITROSTAT) 0.4 MG SL tablet TAKE 1 TABLET BY MOUTH SUBLINGUALLY EVERY 5 MINS AS NEEDED FOR CHEST PAIN. IF >1 NEEDED, CALL 911   telmisartan-hydrochlorothiazide (MICARDIS HCT) 80-12.5 MG tablet Take 1 tablet by mouth daily. In am d/c 40-12.5 mg dose   No facility-administered encounter medications on file as of  12/17/2022.   Allergies  Allergen Reactions   Cymbalta [Duloxetine Hcl] Other (See Comments)    drowsiness   Patient Active Problem List   Diagnosis Date Noted   Occasional tremors 12/13/2022   Chronic pain 12/13/2022   History of hepatitis C 09/17/2021   Multiple duodenal ulcers 09/16/2021   Chronic obstructive pulmonary disease (HCC) 09/04/2021   Erectile dysfunction 09/04/2021   Cirrhosis of liver (HCC) 08/04/2021   Aortic atherosclerosis (HCC) 08/04/2021   Prediabetes 07/16/2021   Goiter 05/05/2021   Gastroesophageal reflux disease 03/25/2021   Chronic right-sided low back pain with right-sided sciatica 02/06/2020   Ulcerative colitis (HCC)    Prostate cancer (HCC)    Lumbar degenerative disc disease 01/17/2019   Chronic heart failure with preserved ejection fraction (HCC) 01/07/2019   Hyperlipidemia LDL goal <70 01/07/2019   Essential hypertension 11/24/2018   Coronary artery disease of native artery of native heart with stable angina pectoris (HCC) 11/24/2018   Peripheral arterial disease (HCC) 11/24/2018   Disorder of ejaculation 11/24/2018   Social History   Socioeconomic History   Marital status: Married    Spouse name: Britta Mccreedy   Number of children: 2   Years of education: Not on file   Highest education level: GED or equivalent  Occupational History   Occupation: retired  Tobacco Use   Smoking status: Former    Current packs/day: 0.00    Average packs/day: 2.0 packs/day for 40.0 years (80.0 ttl pk-yrs)    Types: Cigarettes    Start date: 04/20/1958    Quit date: 04/20/1998    Years since quitting: 24.6   Smokeless tobacco: Never   Tobacco comments:    quit in 2000s   Vaping Use   Vaping status: Never Used  Substance and Sexual Activity   Alcohol use: Yes    Comment: occasionally   Drug use: Not Currently   Sexual activity: Yes  Other Topics Concern   Not on file  Social History Narrative   Moved from [place] in 10/2018   2 sons in 100s as of  11/24/2018    Married wife is DPR Britta Mccreedy x 45 years as of 04/2019   Social Determinants of Health   Financial Resource Strain: Low Risk  (11/23/2022)   Overall Financial Resource Strain (CARDIA)    Difficulty of Paying Living Expenses: Not hard at all  Food Insecurity: No Food Insecurity (11/23/2022)   Hunger Vital Sign    Worried About Running Out of Food in the Last Year: Never true    Ran Out of Food in the Last Year: Never true  Transportation Needs: No Transportation Needs (11/23/2022)   PRAPARE - Administrator, Civil Service (Medical): No    Lack of Transportation (Non-Medical): No  Physical Activity: Insufficiently Active (11/23/2022)   Exercise Vital Sign    Days of Exercise per Week: 1 day    Minutes of Exercise per Session: 10 min  Stress: No Stress Concern Present (11/23/2022)   [employer] of Occupational Health - Occupational Stress Questionnaire    Feeling of Stress : Not at all  Social Connections: Moderately  Integrated (11/23/2022)   Social Connection and Isolation Panel [NHANES]    Frequency of Communication with Friends and Family: Twice a week    Frequency of Social Gatherings with Friends and Family: Once a week    Attends Religious Services: 1 to 4 times per year    Active Member of [employer] or Organizations: No    Attends Banker Meetings: Never    Marital Status: Married  Recent Concern: Social Connections - Moderately Isolated (10/12/2022)   Social Connection and Isolation Panel [NHANES]    Frequency of Communication with Friends and Family: More than three times a week    Frequency of Social Gatherings with Friends and Family: More than three times a week    Attends Religious Services: Never    Database administrator or Organizations: No    Attends Banker Meetings: Never    Marital Status: Married  Catering manager Violence: Not At Risk (10/12/2022)   Humiliation, Afraid, Rape, and Kick questionnaire    Fear of Current or  Ex-Partner: No    Emotionally Abused: No    Physically Abused: No    Sexually Abused: No    Mr. Zenaida Niece Hagen's family history includes Breast cancer in his mother; Diabetes in his brother and sister; Heart disease in his mother; Stroke in his mother.      Objective:    Vitals:   12/17/22 1325  BP: 138/80  Pulse: 61    Physical Exam Well-developed well-nourished elderly African-American male in no acute distress.  Pleasant height, Weight, 192 BMI 30.07  HEENT; nontraumatic normocephalic, EOMI, PE R LA, sclera anicteric. Oropharynx; not examined today Neck; supple, no JVD Cardiovascular; regular rate and rhythm with S1-S2, no murmur rub or gallop Pulmonary; Clear bilaterally Abdomen; soft, mild tenderness in the hypogastrium and periumbilical area, no guarding or rebound no palpable mass or hepatosplenomegaly nondistended, no  bowel sounds are active Rectal; not done today Skin; benign exam, no jaundice rash or appreciable lesions Extremities; no clubbing cyanosis or edema skin warm and dry Neuro/Psych; alert and oriented x4, grossly nonfocal mood and affect appropriate        Assessment & Plan:   #30 72 year old African-American male with history of reported pain and ulcerative colitis diagnosed 18 years ago, quiescent at the time of last colonoscopy February 2023, and continues on low-dose Lialda 1.2 g twice daily Asymptomatic  #2 history of small duodenal ulcers 2023, completed course of PPI, no current symptoms, biopsies negative for H. Pylori  #3 history of hepatitis C status posttreatment with Harvoni, eradicated  Recent labs per PCP with positive hepatitis C antibody and referred back here Patient reassured that his antibody will always be positive, and that does not indicate active infection  #4 compensated cirrhosis-by ultrasound July 2024 and elastography June 2023-secondary to hepatitis C plus minus component of EtOH  #5 prostate cancer #6.  BPH #7.  Coronary  artery disease status post total of 7 coronary stents maintained on Plavix and aspirin #8.  Peripheral vascular disease status post bilateral lower extremity stenting #9  probable COPD #10.  Congestive heart failure with preserved EF  Plan;  For cirrhosis surveillance will check INR today, AFP, and for reassurance will repeat hep C RNA quant  Continue Lialda 1.2 g p.o. twice daily-refill sent  #3 will plan office follow-up with Dr. Meridee Nicholson  or myself in December or January, and at that time we will get him scheduled for follow-up colonoscopy and we will plan to  do repeat EGD at that time (history of duodenal ulcers and also for variceal surveillance. Will need to come off Plavix for procedures.    Sherel Fennell Oswald Hillock PA-C 12/17/2022   Cc: Dana Allan, MD

## 2022-12-18 NOTE — Progress Notes (Signed)
Attending Physician's Attestation   I have reviewed the chart.   I agree with the Advanced Practitioner's note, impression, and recommendations with any updates as below. Unless he has had any reexposure to hepatitis C, his previous negative viral load is helpful and agreed he will always have a positive hepatitis C antibody.  Corliss Parish, MD [facility] Advanced Endoscopy Office # [phone]

## 2022-12-19 LAB — HCV RNA QUANT: Hepatitis C Quantitation: NOT DETECTED [IU]/mL

## 2022-12-21 ENCOUNTER — Other Ambulatory Visit: Payer: Self-pay | Admitting: Family Medicine

## 2022-12-21 DIAGNOSIS — J9801 Acute bronchospasm: Secondary | ICD-10-CM

## 2022-12-21 DIAGNOSIS — U071 COVID-19: Secondary | ICD-10-CM

## 2022-12-22 LAB — AFP TUMOR MARKER: AFP-Tumor Marker: 4.2 ng/mL (ref <6.1)

## 2023-01-06 ENCOUNTER — Ambulatory Visit (INDEPENDENT_AMBULATORY_CARE_PROVIDER_SITE_OTHER): Payer: Medicare Other | Admitting: Neurology

## 2023-01-06 ENCOUNTER — Telehealth: Payer: Self-pay | Admitting: Internal Medicine

## 2023-01-06 ENCOUNTER — Encounter: Payer: Self-pay | Admitting: Neurology

## 2023-01-06 VITALS — BP 184/94 | HR 66 | Ht 67.0 in | Wt 190.0 lb

## 2023-01-06 DIAGNOSIS — R269 Unspecified abnormalities of gait and mobility: Secondary | ICD-10-CM | POA: Diagnosis not present

## 2023-01-06 DIAGNOSIS — R202 Paresthesia of skin: Secondary | ICD-10-CM | POA: Diagnosis not present

## 2023-01-06 DIAGNOSIS — R410 Disorientation, unspecified: Secondary | ICD-10-CM | POA: Insufficient documentation

## 2023-01-06 DIAGNOSIS — M5416 Radiculopathy, lumbar region: Secondary | ICD-10-CM

## 2023-01-06 NOTE — Progress Notes (Signed)
Chief Complaint  Patient presents with   New Patient (Initial Visit)    Rm14, alone, tremors np: whole body tremors sporadic intermittent lasted maximum of 8 mins. Feels like sinking down in body and like massage chair at mall.       ASSESSMENT AND PLAN  Erik Nicholson is a 72 y.o. male   Recurrent episode of traveling paresthesias throughout his body, could not respond to surrounding during the spells,  Unsure etiology, differentiation diagnosis including partial seizure, versus presyncope  MRI of the brain  EEG  30 days cardiac monitoring  DIAGNOSTIC DATA (LABS, IMAGING, TESTING) - I reviewed patient records, labs, notes, testing and imaging myself where available.   MEDICAL HISTORY:  Erik Nicholson, seen in request by   Erik Allan, MD [address] [place],  [place] [address], Erik Allan, MD   I reviewed and summarized the referring note.PMHX HTN HLD CAD,  History of prostate cancer, s/p radiation Lumbar decompression surgery for right lumbar radiculopathy Hx of hepatitis C Peripheral vascular disease  Could not walk, electrical shock since Jan 2024, sensation of Tens unit entire body, maxim strength, 8 mintues,  whole body chair,  head down.  He has it sporacic, last week, sitting down watch TV,  could not see it, conufsed,  could not do anythings, top of head, sinking down into his body, if he cloase his eye, he will not be able to open it again,  he could not communicate  A month back, wife try to ask his wife, to touch her, verbally able to tel lhis wife,  scary,  could nto anythigns,   Everytim, he was sittign, getting to the car, sat down,      PHYSICAL EXAM:   Vitals:   01/06/23 1535  BP: (!) 184/94  Pulse: 66  Weight: 190 lb (86.2 kg)  Height: 5\' 7"  (1.702 m)    Body mass index is 29.76 kg/m.  PHYSICAL EXAMNIATION:  Gen: NAD, conversant, well nourised, well groomed                     Cardiovascular: Regular rate rhythm, no peripheral  edema, warm, nontender. Eyes: Conjunctivae clear without exudates or hemorrhage Neck: Supple, no carotid bruits. Pulmonary: Clear to auscultation bilaterally   NEUROLOGICAL EXAM:  MENTAL STATUS: Speech/cognition: Awake, alert, oriented to history taking and casual conversation CRANIAL NERVES: CN II: Visual fields are full to confrontation. Pupils are round equal and briskly reactive to light. CN III, IV, VI: extraocular movement are normal. No ptosis. CN V: Facial sensation is intact to light touch CN VII: Face is symmetric with normal eye closure  CN VIII: Hearing is normal to causal conversation. CN IX, X: Phonation is normal. CN XI: Head turning and shoulder shrug are intact  MOTOR: Wearing right wrist the brace, upper extremity motor strength is normal, mild right ankle dorsiflexion toe flexion weakness  REFLEXES: Reflexes are 1 and symmetric at the biceps, triceps, knees, and ankles. Plantar responses are flexor.  SENSORY: Intact to light touch, pinprick and vibratory sensation are intact in fingers and toes.  COORDINATION: There is no trunk or limb dysmetria noted.  GAIT/STANCE: Push-up to get up from seated position, mildly antalgic  REVIEW OF SYSTEMS:  Full 14 system review of systems performed and notable only for as above All other review of systems were negative.   ALLERGIES: Allergies  Allergen Reactions   Cymbalta [Duloxetine Hcl] Other (See Comments)    drowsiness  HOME MEDICATIONS: Current Outpatient Medications  Medication Sig Dispense Refill   acetaminophen (TYLENOL) 500 MG tablet Take 500-1,000 mg by mouth every 6 (six) hours as needed (pain.).     albuterol (VENTOLIN HFA) 108 (90 Base) MCG/ACT inhaler INHALE 1-2 PUFFS BY MOUTH EVERY 6 HOURS AS NEEDED FOR WHEEZE OR SHORTNESS OF BREATH 18 each 0   aspirin EC 81 MG tablet Take 81 mg by mouth daily. Take while plavix is held per dr end     atorvastatin (LIPITOR) 40 MG tablet Take 1 tablet (40 mg  total) by mouth daily. 30 tablet 5   clopidogrel (PLAVIX) 75 MG tablet Take 1 tablet (75 mg total) by mouth daily. 90 tablet 3   cyclobenzaprine (FLEXERIL) 5 MG tablet Take 5-10 mg by mouth at bedtime as needed for muscle spasms.     cycloSPORINE (RESTASIS) 0.05 % ophthalmic emulsion Place 1 drop into both eyes 2 (two) times daily.     mesalamine (LIALDA) 1.2 g EC tablet TAKE 2 TABLETS BY MOUTH DAILY WITH BREAKFAST. 180 tablet 3   metoprolol succinate (TOPROL-XL) 100 MG 24 hr tablet TAKE 1 TABLET BY MOUTH EVERY DAY WITH OR IMMEDIATELY FOLLOWING A MEAL 90 tablet 1   nitroGLYCERIN (NITROSTAT) 0.4 MG SL tablet TAKE 1 TABLET BY MOUTH SUBLINGUALLY EVERY 5 MINS AS NEEDED FOR CHEST PAIN. IF >1 NEEDED, CALL 911 75 tablet 4   oxyCODONE-acetaminophen (PERCOCET) 10-325 MG tablet Take 1 tablet by mouth 4 (four) times daily as needed.     telmisartan-hydrochlorothiazide (MICARDIS HCT) 80-12.5 MG tablet Take 1 tablet by mouth daily. In am d/c 40-12.5 mg dose 90 tablet 3   No current facility-administered medications for this visit.    PAST MEDICAL HISTORY: Past Medical History:  Diagnosis Date   Abdominal gas pain 12/23/2018   Abnormal gait 04/30/2021   Abnormal liver diagnostic imaging 09/16/2021   Abnormal MRI, lumbar spine 05/09/2021   Abnormal MRI, thoracic spine 05/09/2021   Abnormal weight loss 11/24/2018   Accelerating angina (HCC) 03/15/2020   Antiplatelet or antithrombotic long-term use 12/23/2018   Arthritis    both hands   Arthritis of right hip 05/05/2021   Benign prostatic hyperplasia with post-void dribbling 11/24/2018   BPH (benign prostatic hyperplasia)    CAD (coronary artery disease)    x 7 cardiac stents   Chronic back pain    L4/5 with chronic right leg pain    Chronic pain syndrome 11/24/2018   Chronic radicular lumbar pain (Right) 01/17/2019   Claudication in peripheral vascular disease (HCC) 01/07/2019   COVID-19    10/20/20   Dyspnea on exertion 08/17/2020   Floater,  vitreous, left    Gastric nodule 05/31/2020   History of chronic ulcerative colitis 05/31/2020   History of lumbar surgery (x2) 01/17/2019   History of prostate cancer 02/10/2021   Hx of hepatitis C    treated with Harvoni in 2014   Hx of hepatitis C 12/23/2018   Hyperlipidemia    Hypertension    Intestinal metaplasia of gastric mucosa 12/23/2018   Leg edema 04/30/2021   Leg edema, right 05/03/2019   Lumbar radiculopathy, right 04/30/2021   Malignant neoplasm of prostate (HCC) 03/28/2019   Mid back pain 04/30/2021   Neuromuscular disorder (HCC)    nerve damage in back   Neuropathy    tingling in toes @ times   Other hemorrhoids 12/23/2018   Palpitations 02/10/2021   Prostate cancer (HCC)    fall 2022, radiation therapy done dr Kathrynn Running  PVD (peripheral vascular disease) (HCC)    2 stents right leg and 1 in left    Rectal urgency 12/23/2018   Right hip pain 04/30/2021   Right leg weakness 05/03/2019   Right wrist pain 04/30/2021   S/P insertion of penile implant 11/11/2021   Subepithelial gastric lesion 03/25/2021   Ulcerative chronic pancolitis without complications (HCC) 12/23/2018   Ulcerative colitis (HCC)    in remission since fall of 2022   Ulcerative colitis with complication (HCC) 11/24/2018   Urinary incontinence 11/24/2018   Weakness of both lower extremities 04/30/2021   Weight gain 03/25/2021    PAST SURGICAL HISTORY: Past Surgical History:  Procedure Laterality Date   back surgery     x 2 lumbar last 09/2016 in [place]   BIOPSY  06/02/2021   Procedure: BIOPSY;  Surgeon: Lemar Lofty., MD;  Location: [facility];  Service: Gastroenterology;;  EGD and COLON   CARDIAC CATHETERIZATION     CATARACT EXTRACTION, BILATERAL     COLONOSCOPY     last 09/2018 in [place]   COLONOSCOPY WITH PROPOFOL N/A 06/02/2021   Procedure: COLONOSCOPY WITH PROPOFOL;  Surgeon: Lemar Lofty., MD;  Location: [facility];  Service:  Gastroenterology;  Laterality: N/A;   CORONARY BALLOON ANGIOPLASTY N/A 03/21/2020   Procedure: CORONARY BALLOON ANGIOPLASTY;  Surgeon: Yvonne Kendall, MD;  Location: [facility];  Service: Cardiovascular;  Laterality: N/A;   CORONARY PRESSURE/FFR STUDY N/A 03/21/2020   Procedure: INTRAVASCULAR PRESSURE WIRE/FFR STUDY;  Surgeon: Yvonne Kendall, MD;  Location: [facility];  Service: Cardiovascular;  Laterality: N/A;   ESOPHAGOGASTRODUODENOSCOPY (EGD) WITH PROPOFOL N/A 06/02/2021   Procedure: ESOPHAGOGASTRODUODENOSCOPY (EGD) WITH PROPOFOL;  Surgeon: Meridee Score Netty Starring., MD;  Location: [facility];  Service: Gastroenterology;  Laterality: N/A;   HERNIA REPAIR     right    LEFT HEART CATH AND CORONARY ANGIOGRAPHY N/A 03/21/2020   Procedure: LEFT HEART CATH AND CORONARY ANGIOGRAPHY;  Surgeon: Yvonne Kendall, MD;  Location: [facility];  Service: Cardiovascular;  Laterality: N/A;   PENILE PROSTHESIS IMPLANT  1995   PERCUTANEOUS CORONARY STENT INTERVENTION (PCI-S)     x 7-8 heart   PERIPHERAL ARTERIAL STENT GRAFT     PROSTATE BIOPSY     pvd     with stenting x 2 right leg below knee and 1 left thigh    REMOVAL OF PENILE PROSTHESIS N/A 11/11/2021   Procedure: REMOVAL OF PENILE PROSTHESIS AND REPLACEMENT OF PENILE PROSTHESIS [employer];  Surgeon: Despina Arias, MD;  Location: [facility];  Service: Urology;  Laterality: N/A;   SHOULDER ARTHROSCOPY Bilateral    TRIGGER FINGER RELEASE Left    UPPER ESOPHAGEAL ENDOSCOPIC ULTRASOUND (EUS) N/A 06/02/2021   Procedure: UPPER ESOPHAGEAL ENDOSCOPIC ULTRASOUND (EUS);  Surgeon: Lemar Lofty., MD;  Location: [facility];  Service: Gastroenterology;  Laterality: N/A;   VITRECTOMY Left    left eye    FAMILY HISTORY: Family History  Problem Relation Age of Onset   Heart disease Mother    Stroke Mother    Breast cancer Mother    Diabetes Sister    Diabetes Brother    Colon cancer Neg Hx     Esophageal cancer Neg Hx    Inflammatory bowel disease Neg Hx    Liver disease Neg Hx    Pancreatic cancer Neg Hx    Rectal cancer Neg Hx    Stomach cancer Neg Hx     SOCIAL HISTORY: Social History  Socioeconomic History   Marital status: Married    Spouse name: Britta Mccreedy   Number of children: 2   Years of education: Not on file   Highest education level: GED or equivalent  Occupational History   Occupation: retired  Tobacco Use   Smoking status: Former    Current packs/day: 0.00    Average packs/day: 2.0 packs/day for 40.0 years (80.0 ttl pk-yrs)    Types: Cigarettes    Start date: 04/20/1958    Quit date: 04/20/1998    Years since quitting: 24.7   Smokeless tobacco: Never   Tobacco comments:    quit in 2000s   Vaping Use   Vaping status: Never Used  Substance and Sexual Activity   Alcohol use: Yes    Comment: occasionally   Drug use: Not Currently   Sexual activity: Yes  Other Topics Concern   Not on file  Social History Narrative   Moved from [place] in 10/2018   2 sons in 37s as of 11/24/2018    Married wife is DPR Britta Mccreedy x 45 years as of 04/2019   Social Determinants of Health   Financial Resource Strain: Low Risk  (11/23/2022)   Overall Financial Resource Strain (CARDIA)    Difficulty of Paying Living Expenses: Not hard at all  Food Insecurity: No Food Insecurity (11/23/2022)   Hunger Vital Sign    Worried About Running Out of Food in the Last Year: Never true    Ran Out of Food in the Last Year: Never true  Transportation Needs: No Transportation Needs (11/23/2022)   PRAPARE - Administrator, Civil Service (Medical): No    Lack of Transportation (Non-Medical): No  Physical Activity: Insufficiently Active (11/23/2022)   Exercise Vital Sign    Days of Exercise per Week: 1 day    Minutes of Exercise per Session: 10 min  Stress: No Stress Concern Present (11/23/2022)   [employer] of Occupational Health - Occupational Stress Questionnaire     Feeling of Stress : Not at all  Social Connections: Moderately Integrated (11/23/2022)   Social Connection and Isolation Panel [NHANES]    Frequency of Communication with Friends and Family: Twice a week    Frequency of Social Gatherings with Friends and Family: Once a week    Attends Religious Services: 1 to 4 times per year    Active Member of [employer] or Organizations: No    Attends Banker Meetings: Never    Marital Status: Married  Recent Concern: Social Connections - Moderately Isolated (10/12/2022)   Social Connection and Isolation Panel [NHANES]    Frequency of Communication with Friends and Family: More than three times a week    Frequency of Social Gatherings with Friends and Family: More than three times a week    Attends Religious Services: Never    Database administrator or Organizations: No    Attends Banker Meetings: Never    Marital Status: Married  Catering manager Violence: Not At Risk (10/12/2022)   Humiliation, Afraid, Rape, and Kick questionnaire    Fear of Current or Ex-Partner: No    Emotionally Abused: No    Physically Abused: No    Sexually Abused: No      Levert Feinstein, M.D. Ph.D.  [facility] [address], [address] [place], [place] [address] Ph: [phone] Fax: [phone]  CC:  Erik Allan, MD [address] [place],  [place] [address]  Erik Allan, MD

## 2023-01-06 NOTE — Telephone Encounter (Signed)
*  STAT* If patient is at the pharmacy, call can be transferred to refill team.   1. Which medications need to be refilled? (please list name of each medication and dose if known) clopidogrel (PLAVIX) 75 MG tablet [[id number]]    2. Would you like to learn more about the convenience, safety, & potential cost savings by using the [facility]? Na      3. Are you open to using the [facility] (Type [facility]. Na .   4. Which pharmacy/location (including street and city if local pharmacy) is medication to be sent to?[facility] [address]   5. Do they need a 30 day or 90 day supply? 90

## 2023-01-07 MED ORDER — CLOPIDOGREL BISULFATE 75 MG PO TABS: 75.0000 mg | ORAL_TABLET | Freq: Every day | ORAL | 0 refills | Status: DC

## 2023-01-07 NOTE — Telephone Encounter (Signed)
Requested Prescriptions   Signed Prescriptions Disp Refills   clopidogrel (PLAVIX) 75 MG tablet 90 tablet 0    Sig: Take 1 tablet (75 mg total) by mouth daily.    Authorizing Provider: END, CHRISTOPHER    Ordering User: Guerry Minors

## 2023-01-08 ENCOUNTER — Telehealth: Payer: Self-pay | Admitting: Neurology

## 2023-01-08 NOTE — Telephone Encounter (Signed)
BCBS federal basic plan/medicare NPR sent to [facility] [phone]    Pod-The cardiac monitor order needs to have [facility] as the location.

## 2023-01-11 ENCOUNTER — Other Ambulatory Visit: Payer: Self-pay | Admitting: Neurology

## 2023-01-11 DIAGNOSIS — R269 Unspecified abnormalities of gait and mobility: Secondary | ICD-10-CM

## 2023-01-11 DIAGNOSIS — M5416 Radiculopathy, lumbar region: Secondary | ICD-10-CM

## 2023-01-11 DIAGNOSIS — R410 Disorientation, unspecified: Secondary | ICD-10-CM

## 2023-01-11 DIAGNOSIS — R55 Syncope and collapse: Secondary | ICD-10-CM

## 2023-01-11 DIAGNOSIS — R202 Paresthesia of skin: Secondary | ICD-10-CM

## 2023-01-11 NOTE — Telephone Encounter (Signed)
I re-entered cardiac monitoring to [facility]

## 2023-01-15 ENCOUNTER — Other Ambulatory Visit: Payer: Self-pay | Admitting: Family Medicine

## 2023-01-15 DIAGNOSIS — M5416 Radiculopathy, lumbar region: Secondary | ICD-10-CM | POA: Diagnosis not present

## 2023-01-15 DIAGNOSIS — J9801 Acute bronchospasm: Secondary | ICD-10-CM

## 2023-01-15 DIAGNOSIS — U071 COVID-19: Secondary | ICD-10-CM

## 2023-01-15 DIAGNOSIS — R55 Syncope and collapse: Secondary | ICD-10-CM | POA: Diagnosis not present

## 2023-01-15 DIAGNOSIS — R410 Disorientation, unspecified: Secondary | ICD-10-CM

## 2023-01-15 DIAGNOSIS — R269 Unspecified abnormalities of gait and mobility: Secondary | ICD-10-CM

## 2023-01-15 DIAGNOSIS — R202 Paresthesia of skin: Secondary | ICD-10-CM

## 2023-02-01 ENCOUNTER — Ambulatory Visit (INDEPENDENT_AMBULATORY_CARE_PROVIDER_SITE_OTHER): Payer: Medicare Other | Admitting: Neurology

## 2023-02-01 DIAGNOSIS — M5416 Radiculopathy, lumbar region: Secondary | ICD-10-CM

## 2023-02-01 DIAGNOSIS — R410 Disorientation, unspecified: Secondary | ICD-10-CM

## 2023-02-01 DIAGNOSIS — R202 Paresthesia of skin: Secondary | ICD-10-CM | POA: Diagnosis not present

## 2023-02-01 DIAGNOSIS — R269 Unspecified abnormalities of gait and mobility: Secondary | ICD-10-CM

## 2023-02-04 ENCOUNTER — Other Ambulatory Visit: Payer: Self-pay | Admitting: *Deleted

## 2023-02-04 ENCOUNTER — Telehealth: Payer: Self-pay | Admitting: Internal Medicine

## 2023-02-04 DIAGNOSIS — I2510 Atherosclerotic heart disease of native coronary artery without angina pectoris: Secondary | ICD-10-CM

## 2023-02-04 NOTE — Telephone Encounter (Signed)
*  STAT* If patient is at the pharmacy, call can be transferred to refill team.   1. Which medications need to be refilled? (please list name of each medication and dose if known)   nitroGLYCERIN (NITROSTAT) 0.4 MG SL tablet   2. Would you like to learn more about the convenience, safety, & potential cost savings by using the [facility]?   3. Are you open to using the [facility] (Type [facility]. ).  4. Which pharmacy/location (including street and city if local pharmacy) is medication to be sent to?  [id number] - [place], [place] - [address]   5. Do they need a 30 day or 90 day supply?   30 day  Patient stated he is completely out of this medication.

## 2023-02-04 NOTE — Telephone Encounter (Signed)
February 04, 2023   02/04/23  2:53 PM Please advise If ok to refill nitroglycerin last filled by PCP 10/2020.

## 2023-02-04 NOTE — Telephone Encounter (Signed)
Fwd to nurse for refill last filled by PCP.

## 2023-02-05 MED ORDER — NITROGLYCERIN 0.4 MG SL SUBL: 0.4000 mg | SUBLINGUAL_TABLET | SUBLINGUAL | 3 refills | Status: DC | PRN

## 2023-02-05 NOTE — Telephone Encounter (Signed)
Refill sent to pharmacy as requested

## 2023-02-05 NOTE — Telephone Encounter (Signed)
Pt's is requesting a refill on nitroglycerin. Dr. Okey Dupre prescribe this medication before. Would the provider like to refill this medication? Please address

## 2023-02-09 ENCOUNTER — Other Ambulatory Visit: Payer: Self-pay

## 2023-02-09 DIAGNOSIS — I2510 Atherosclerotic heart disease of native coronary artery without angina pectoris: Secondary | ICD-10-CM

## 2023-02-09 MED ORDER — NITROGLYCERIN 0.4 MG SL SUBL: 0.4000 mg | SUBLINGUAL_TABLET | SUBLINGUAL | 3 refills | Status: DC | PRN

## 2023-02-09 NOTE — Telephone Encounter (Signed)
RX sent to pharmacy  

## 2023-02-16 ENCOUNTER — Telehealth: Payer: Self-pay | Admitting: Physician Assistant

## 2023-02-16 ENCOUNTER — Other Ambulatory Visit: Payer: Self-pay

## 2023-02-16 ENCOUNTER — Ambulatory Visit
Admission: RE | Admit: 2023-02-16 | Discharge: 2023-02-16 | Disposition: A | Discharge status: Home / Self Care | Payer: Medicare Other | Source: Ambulatory Visit | Attending: Neurology | Admitting: Neurology

## 2023-02-16 DIAGNOSIS — R202 Paresthesia of skin: Secondary | ICD-10-CM

## 2023-02-16 DIAGNOSIS — R269 Unspecified abnormalities of gait and mobility: Secondary | ICD-10-CM

## 2023-02-16 DIAGNOSIS — M5416 Radiculopathy, lumbar region: Secondary | ICD-10-CM

## 2023-02-16 DIAGNOSIS — R410 Disorientation, unspecified: Secondary | ICD-10-CM

## 2023-02-16 MED ORDER — MESALAMINE 1.2 G PO TBEC: 2.4000 g | DELAYED_RELEASE_TABLET | Freq: Every day | ORAL | 3 refills | Status: DC

## 2023-02-16 MED ORDER — GADOPICLENOL 0.5 MMOL/ML IV SOLN
9.0000 mL | Freq: Once | INTRAVENOUS | Status: AC | PRN
  Administered 2023-02-16: 9 mL via INTRAVENOUS

## 2023-02-16 NOTE — Telephone Encounter (Signed)
Last office visit 12/17/22.  Medication refilled.

## 2023-02-16 NOTE — Telephone Encounter (Signed)
Patient called and stated he needed a refill medication of mesalamine

## 2023-02-17 ENCOUNTER — Ambulatory Visit: Payer: Medicare Other | Attending: Neurology

## 2023-02-17 DIAGNOSIS — R55 Syncope and collapse: Secondary | ICD-10-CM

## 2023-02-17 DIAGNOSIS — R269 Unspecified abnormalities of gait and mobility: Secondary | ICD-10-CM

## 2023-02-17 DIAGNOSIS — R410 Disorientation, unspecified: Secondary | ICD-10-CM

## 2023-02-17 DIAGNOSIS — M5416 Radiculopathy, lumbar region: Secondary | ICD-10-CM

## 2023-02-17 DIAGNOSIS — R202 Paresthesia of skin: Secondary | ICD-10-CM

## 2023-02-18 ENCOUNTER — Telehealth: Payer: Self-pay | Admitting: Neurology

## 2023-02-18 DIAGNOSIS — I63349 Cerebral infarction due to thrombosis of unspecified cerebellar artery: Secondary | ICD-10-CM | POA: Insufficient documentation

## 2023-02-18 DIAGNOSIS — R202 Paresthesia of skin: Secondary | ICD-10-CM

## 2023-02-18 NOTE — Telephone Encounter (Signed)
Please call patient MRI of the brain showed chronic left cerebellar stroke, also evidence of microhemorrhage at left posterior temporal region  Echocardiogram in January 2024 showed no significant abnormality  I will complete evaluation with ultrasound of carotid artery  I Saw aspirin 81 mg on his medications list, he should remain on the medications.   MRI brain with and without contrast demonstrating: -Chronic left cerebellar ischemic infarctions (similar to CT findings from 10/02/2020). -Mild chronic small vessel ischemic disease. -Chronic cerebral microhemorrhage in the left posterior temporal region. -No acute findings.     Orders Placed This Encounter  Procedures   VAS US CAROTID

## 2023-02-27 NOTE — Procedures (Signed)
   HISTORY: 72 year old male with recurrent episode of traveling paresthesias throughout his body, unresponsive during the spell,  TECHNIQUE:  This is a routine 16 channel EEG recording with one channel devoted to a limited EKG recording.  It was performed during wakefulness, drowsiness and asleep.  Hyperventilation was not performed due to history of COPD.  Photic stimulation were performed as activating procedures.  There are minimum muscle and movement artifact noted.  Upon maximum arousal, posterior dominant waking rhythm consistent of mildly dysrhythmic mixed alpha and theta range activity. Activities are symmetric over the bilateral posterior derivations and attenuated with eye opening.  Photic stimulation did not alter the tracing.   During EEG recording, patient developed drowsiness and entered sleep, sleep EEG demonstrated architecture, there were frontal centrally dominant vertex waves and symmetric sleep spindles noted.  During EEG recording, there was no epileptiform discharge noted there was occasionally C3 and C4 sharp transient.  EKG demonstrate normal sinus rhythm.  CONCLUSION: This is a mild abnormal EEG.  There is intermittent background slowing, indicating bihemispheric malfunction.  Levert Feinstein, M.D. Ph.D.  [facility] [address] [place], [place] [address] Phone: [phone] Fax:      [phone]

## 2023-03-08 ENCOUNTER — Other Ambulatory Visit: Payer: Self-pay

## 2023-03-08 ENCOUNTER — Telehealth: Payer: Self-pay | Admitting: Physician Assistant

## 2023-03-08 MED ORDER — MESALAMINE 1.2 G PO TBEC: 2.4000 g | DELAYED_RELEASE_TABLET | Freq: Two times a day (BID) | ORAL | 6 refills | Status: DC

## 2023-03-08 MED ORDER — PREDNISONE 5 MG PO TABS: ORAL_TABLET | ORAL | 0 refills | Status: DC

## 2023-03-08 NOTE — Telephone Encounter (Signed)
Inbound call from patient, requesting medication refill of prednisone, he states he is having a ulcerative colitis flare and only needs it for a couple days until his flare goes away. Would also like to speak to a nurse for pain management.

## 2023-03-08 NOTE — Telephone Encounter (Signed)
Spoke with the patient. Stool frequency with some urgency. The urges are keeping him from sleeping very well. This has been persistent for about a week. "This happens once in a while but not often. Usually it settles itself, but sometimes I have to take prednisone."  Denies fever, recent antibiotics or nausea.  Please advise.

## 2023-03-08 NOTE — Telephone Encounter (Signed)
Prescriptions called to the pharmacy. Patient is scheduled for follow up in December. Called the patient. No answer. Left information on his voicemail.

## 2023-03-09 NOTE — Telephone Encounter (Signed)
Spoke with the patient. Confirmed he received my voicemail and understood the plan. He states he did receive the message. Patient states he "just picked up a 90 day supply of that other medicine" referring to Lialda. Agrees he will increase the dose as instructed. Call me when he is getting low on the supply. I will call the new prescription to the pharmacy with the new instructions. Agrees to this plan. Thanks me for my call.

## 2023-03-12 ENCOUNTER — Ambulatory Visit (HOSPITAL_COMMUNITY)
Admission: RE | Admit: 2023-03-12 | Discharge: 2023-03-12 | Disposition: A | Discharge status: Home / Self Care | Payer: Medicare Other | Source: Ambulatory Visit | Attending: Neurology | Admitting: Neurology

## 2023-03-12 DIAGNOSIS — R202 Paresthesia of skin: Secondary | ICD-10-CM | POA: Insufficient documentation

## 2023-03-12 DIAGNOSIS — I63349 Cerebral infarction due to thrombosis of unspecified cerebellar artery: Secondary | ICD-10-CM | POA: Insufficient documentation

## 2023-03-21 NOTE — Progress Notes (Signed)
Kindly inform the patient that carotid ultrasound study shows no significant narrowing of either carotid arteries in the neck

## 2023-03-23 ENCOUNTER — Other Ambulatory Visit (INDEPENDENT_AMBULATORY_CARE_PROVIDER_SITE_OTHER): Payer: Medicare Other

## 2023-03-23 ENCOUNTER — Encounter: Payer: Self-pay | Admitting: Gastroenterology

## 2023-03-23 ENCOUNTER — Ambulatory Visit (INDEPENDENT_AMBULATORY_CARE_PROVIDER_SITE_OTHER): Payer: Medicare Other | Admitting: Gastroenterology

## 2023-03-23 VITALS — BP 138/60 | HR 63 | Ht 67.0 in | Wt 188.0 lb

## 2023-03-23 DIAGNOSIS — K5100 Ulcerative (chronic) pancolitis without complications: Secondary | ICD-10-CM | POA: Diagnosis not present

## 2023-03-23 DIAGNOSIS — K769 Liver disease, unspecified: Secondary | ICD-10-CM

## 2023-03-23 DIAGNOSIS — B1920 Unspecified viral hepatitis C without hepatic coma: Secondary | ICD-10-CM

## 2023-03-23 DIAGNOSIS — K5190 Ulcerative colitis, unspecified, without complications: Secondary | ICD-10-CM | POA: Diagnosis not present

## 2023-03-23 DIAGNOSIS — R152 Fecal urgency: Secondary | ICD-10-CM

## 2023-03-23 DIAGNOSIS — Z8619 Personal history of other infectious and parasitic diseases: Secondary | ICD-10-CM

## 2023-03-23 DIAGNOSIS — K7469 Other cirrhosis of liver: Secondary | ICD-10-CM | POA: Diagnosis not present

## 2023-03-23 DIAGNOSIS — K588 Other irritable bowel syndrome: Secondary | ICD-10-CM

## 2023-03-23 LAB — C-REACTIVE PROTEIN: CRP: <1 mg/dL (ref 0.5–20.0)

## 2023-03-23 LAB — BASIC METABOLIC PANEL
BUN: 18 mg/dL (ref 6–23)
CO2: 28 meq/L (ref 19–32)
Calcium: 9.8 mg/dL (ref 8.4–10.5)
Chloride: 105 meq/L (ref 96–112)
Creatinine, Ser: 0.87 mg/dL (ref 0.40–1.50)
GFR: 85 mL/min (ref >60.00)
Glucose, Bld: 125 mg/dL — ABNORMAL HIGH (ref 70–99)
Potassium: 4.2 meq/L (ref 3.5–5.1)
Sodium: 140 meq/L (ref 135–145)

## 2023-03-23 LAB — SEDIMENTATION RATE: Sed Rate: 13 mm/h (ref 0–20)

## 2023-03-23 LAB — PROTIME-INR
INR: 1.2 {ratio} — ABNORMAL HIGH (ref 0.8–1.0)
Prothrombin Time: 12.4 s (ref 9.6–13.1)

## 2023-03-23 LAB — CBC
HCT: 38.4 % — ABNORMAL LOW (ref 39.0–52.0)
Hemoglobin: 12.9 g/dL — ABNORMAL LOW (ref 13.0–17.0)
MCHC: 33.6 g/dL (ref 30.0–36.0)
MCV: 104.6 fL — ABNORMAL HIGH (ref 78.0–100.0)
Platelets: 223 10*3/uL (ref 150.0–400.0)
RBC: 3.67 Mil/uL — ABNORMAL LOW (ref 4.22–5.81)
RDW: 14.1 % (ref 11.5–15.5)
WBC: 9.2 10*3/uL (ref 4.0–10.5)

## 2023-03-23 MED ORDER — NA SULFATE-K SULFATE-MG SULF 17.5-3.13-1.6 GM/177ML PO SOLN: 1.0000 | Freq: Once | ORAL | 0 refills | Status: AC

## 2023-03-23 MED ORDER — MESALAMINE 1.2 G PO TBEC: 2.4000 g | DELAYED_RELEASE_TABLET | Freq: Two times a day (BID) | ORAL | 6 refills | Status: DC

## 2023-03-23 MED ORDER — PREDNISONE 5 MG PO TABS: ORAL_TABLET | ORAL | 0 refills | Status: AC

## 2023-03-23 MED ORDER — HYOSCYAMINE SULFATE ER 0.375 MG PO TB12: 0.3750 mg | ORAL_TABLET | Freq: Two times a day (BID) | ORAL | 2 refills | Status: DC | PRN

## 2023-03-23 NOTE — Patient Instructions (Signed)
We have sent the following medications to your pharmacy for you to pick up at your convenience: Levbid, Mesalamine, Prednisone(taper, if needed)    Your provider has requested that you go to the basement level for lab work before leaving today. Press "B" on the elevator. The lab is located at the first door on the left as you exit the elevator.  You will be contaced by our office prior to your procedure for directions on holding your Plavix.  If you do not hear from our office 1 week prior to your scheduled procedure, please call [phone] to discuss.  You have been scheduled for an endoscopy and colonoscopy. Please follow the written instructions given to you at your visit today.  Please pick up your prep supplies at the pharmacy within the next 1-3 days.  If you use inhalers (even only as needed), please bring them with you on the day of your procedure.  DO NOT TAKE 7 DAYS PRIOR TO TEST- Trulicity (dulaglutide) Ozempic, Wegovy (semaglutide) Mounjaro (tirzepatide) Bydureon Bcise (exanatide extended release)  DO NOT TAKE 1 DAY PRIOR TO YOUR TEST Rybelsus (semaglutide) Adlyxin (lixisenatide) Victoza (liraglutide) Byetta (exanatide) ___________________________________________________________________________  Due to recent changes in healthcare laws, you may see the results of your imaging and laboratory studies on MyChart before your provider has had a chance to review them.  We understand that in some cases there may be results that are confusing or concerning to you. Not all laboratory results come back in the same time frame and the provider may be waiting for multiple results in order to interpret others.  Please give [place] 48 hours in order for your provider to thoroughly review all the results before contacting the office for clarification of your results.   You will need Abdominal US with Elastography in Jan or Feb 2025. You will be contacted to schedule this by [facility]  Scheduling.   Thank you for choosing me and [facility].  Dr. Meridee Score

## 2023-03-23 NOTE — Progress Notes (Unsigned)
GASTROENTEROLOGY OUTPATIENT CLINIC VISIT   Primary Care Provider Dana Allan, MD [address] [place] [address] [phone]  Patient Profile: Erik Nicholson is a 71 y.o. male with a pmh significant for reported pan ulcerative colitis (dx >16 years ago), CAD (on Plavix), PAD status post stenting, hypertension, hyperlipidemia, chronic back pain, BPH, prostate cancer status post brachytherapy, prior hepatitis C (status post treatment), possible pancreatic gastric rest, penile implant, osteoarthritis.  The patient presents to the [facility] for an evaluation and management of problem(s) noted below:  Problem List No diagnosis found.  Discussed the use of AI scribe software for clinical note transcription with the patient, who gave verbal consent to proceed.  History of Present Illness Please see prior notes for full details of HPI.  Interval History The patient presents for follow-up.  He expresses "recurrent flares of colitis" that he deals with over the course of the year with short courses of steroids.  Recently within the last 10-days he feels he has had another bout of colitis.  These episodes, as we have described in the past as well, are subjective with symptoms of recurrent episodes of loose, semi-soft to liquid bowel movements.   These episodes are associated with severe abdominal pain and cramping, likened to the discomfort experienced during a colonoscopy. He denies any presence of blood in the stool during these episodes.  He has increased fecal urgency.  He is adamant that once he starts short steroid course things resolve quickly.  We have not found in the last few years evidence of overt inflammation in his colon or in his biomarkers, however.   He has been on a regimen of 4.8 g mesalamine.  For unclear reasons, he is not taking Levbid as he has been prescribed in the past.  He has undergone imaging for his liver, with prior history of HCV (post  eradication) but there is concern that he may have compensated cirrhosis or chronic liver disease still (elevated kPa of 10 but not >14 on last elastography).  He is open to EGD being performed at time of his next colonoscopy for variceal screening.  Despite these challenges and issues, he maintains a positive outlook and is actively engaged in managing his health.  He is going on a cruise within the next week and would like to be as healthy as possible as he goes out of the country.    The patient underwent recent cross-sectional imaging that suggested the possibility of underlying cirrhotic morphology of the liver.  For this reason, the patient comes in for follow-up.  From a GI standpoint, he has continued to do relatively well in regards to his bowel habits.  He has between 1 and 2 bowel movements per day.  He is not having any bleeding.  He has not had any progressive abdominal pain since that CT scan.  His weight has been relatively stable.  His upper endoscopy showed evidence of duodenal ulcers and his lower endoscopy showed no evidence of active inflammatory bowel disease.  We made no medication adjustments other than for his ulcer disease.  He is having a urological surgery in the next few months at [facility].  From a cardiology standpoint they are continuing to work him up in regards to shortness of breath and waiting for optimization and follow-up at this time.  He has been experiencing increased body pains for which he does describe some use of alcohol at an increased amount that he normally would since he is  having and not feeling that he is pain is adequately controlled at times.  He does not have a long history of alcohol consumption in excess but does give that history.  There is no family history of liver disease that he is aware of.    The patient denies any issues with jaundice, scleral icterus, generalized pruritus, darkened/amber urine, clay-colored stools, LE edema, hematemesis, coffee-ground  emesis, abdominal distention, confusion.  GI Review of Systems Positive as above Negative for odynophagia, dysphagia, change in bowel habits, melena, hematochezia   Review of Systems General: Denies fevers/chills/weight loss unintentionally HEENT: Denies oral lesions Cardiovascular: Denies chest pain Pulmonary: Denies shortness of breath Gastroenterological: See HPI Genitourinary: Denies darkened urine Hematological: Positive for easy bruising/bleeding due to Plavix use Dermatological: Denies jaundice Psychological: Mood is stable   Medications Current Outpatient Medications  Medication Sig Dispense Refill   acetaminophen (TYLENOL) 500 MG tablet Take 500-1,000 mg by mouth every 6 (six) hours as needed (pain.).     albuterol (VENTOLIN HFA) 108 (90 Base) MCG/ACT inhaler INHALE 1-2 PUFFS BY MOUTH EVERY 6 HOURS AS NEEDED FOR WHEEZE OR SHORTNESS OF BREATH 18 g 3   aspirin EC 81 MG tablet Take 81 mg by mouth daily. Take while plavix is held per dr end     atorvastatin (LIPITOR) 40 MG tablet Take 1 tablet (40 mg total) by mouth daily. 30 tablet 5   clopidogrel (PLAVIX) 75 MG tablet Take 1 tablet (75 mg total) by mouth daily. 90 tablet 0   cyclobenzaprine (FLEXERIL) 5 MG tablet Take 5-10 mg by mouth at bedtime as needed for muscle spasms.     cycloSPORINE (RESTASIS) 0.05 % ophthalmic emulsion Place 1 drop into both eyes 2 (two) times daily.     mesalamine (LIALDA) 1.2 g EC tablet Take 2 tablets (2.4 g total) by mouth 2 (two) times daily. 120 tablet 6   metoprolol succinate (TOPROL-XL) 100 MG 24 hr tablet TAKE 1 TABLET BY MOUTH EVERY DAY WITH OR IMMEDIATELY FOLLOWING A MEAL 90 tablet 1   nitroGLYCERIN (NITROSTAT) 0.4 MG SL tablet Place 1 tablet (0.4 mg total) under the tongue every 5 (five) minutes as needed for chest pain. 25 tablet 3   oxyCODONE-acetaminophen (PERCOCET) 10-325 MG tablet Take 1 tablet by mouth 4 (four) times daily as needed.     predniSONE (DELTASONE) 5 MG tablet 20 mg daily  x 5 days 15 mg daily x 5 days 10 mg x 5 days then as directed 50 tablet 0   telmisartan-hydrochlorothiazide (MICARDIS HCT) 80-12.5 MG tablet Take 1 tablet by mouth daily. In am d/c 40-12.5 mg dose 90 tablet 3   No current facility-administered medications for this visit.    Allergies Allergies  Allergen Reactions   Cymbalta [Duloxetine Hcl] Other (See Comments)    drowsiness    Histories Past Medical History:  Diagnosis Date   Abdominal gas pain 12/23/2018   Abnormal gait 04/30/2021   Abnormal liver diagnostic imaging 09/16/2021   Abnormal MRI, lumbar spine 05/09/2021   Abnormal MRI, thoracic spine 05/09/2021   Abnormal weight loss 11/24/2018   Accelerating angina (HCC) 03/15/2020   Antiplatelet or antithrombotic long-term use 12/23/2018   Arthritis    both hands   Arthritis of right hip 05/05/2021   Benign prostatic hyperplasia with post-void dribbling 11/24/2018   BPH (benign prostatic hyperplasia)    CAD (coronary artery disease)    x 7 cardiac stents   Chronic back pain    L4/5 with chronic right leg pain  Chronic pain syndrome 11/24/2018   Chronic radicular lumbar pain (Right) 01/17/2019   Claudication in peripheral vascular disease (HCC) 01/07/2019   COVID-19    10/20/20   Dyspnea on exertion 08/17/2020   Floater, vitreous, left    Gastric nodule 05/31/2020   History of chronic ulcerative colitis 05/31/2020   History of lumbar surgery (x2) 01/17/2019   History of prostate cancer 02/10/2021   Hx of hepatitis C    treated with Harvoni in 2014   Hx of hepatitis C 12/23/2018   Hyperlipidemia    Hypertension    Intestinal metaplasia of gastric mucosa 12/23/2018   Leg edema 04/30/2021   Leg edema, right 05/03/2019   Lumbar radiculopathy, right 04/30/2021   Malignant neoplasm of prostate (HCC) 03/28/2019   Mid back pain 04/30/2021   Neuromuscular disorder (HCC)    nerve damage in back   Neuropathy    tingling in toes @ times   Other hemorrhoids 12/23/2018    Palpitations 02/10/2021   Prostate cancer (HCC)    fall 2022, radiation therapy done dr Kathrynn Running   PVD (peripheral vascular disease) (HCC)    2 stents right leg and 1 in left    Rectal urgency 12/23/2018   Right hip pain 04/30/2021   Right leg weakness 05/03/2019   Right wrist pain 04/30/2021   S/P insertion of penile implant 11/11/2021   Subepithelial gastric lesion 03/25/2021   Ulcerative chronic pancolitis without complications (HCC) 12/23/2018   Ulcerative colitis (HCC)    in remission since fall of 2022   Ulcerative colitis with complication (HCC) 11/24/2018   Urinary incontinence 11/24/2018   Weakness of both lower extremities 04/30/2021   Weight gain 03/25/2021   Past Surgical History:  Procedure Laterality Date   back surgery     x 2 lumbar last 09/2016 in [place]   BIOPSY  06/02/2021   Procedure: BIOPSY;  Surgeon: Lemar Lofty., MD;  Location: [facility];  Service: Gastroenterology;;  EGD and COLON   CARDIAC CATHETERIZATION     CATARACT EXTRACTION, BILATERAL     COLONOSCOPY     last 09/2018 in [place]   COLONOSCOPY WITH PROPOFOL N/A 06/02/2021   Procedure: COLONOSCOPY WITH PROPOFOL;  Surgeon: Lemar Lofty., MD;  Location: [facility];  Service: Gastroenterology;  Laterality: N/A;   CORONARY BALLOON ANGIOPLASTY N/A 03/21/2020   Procedure: CORONARY BALLOON ANGIOPLASTY;  Surgeon: Yvonne Kendall, MD;  Location: [facility];  Service: Cardiovascular;  Laterality: N/A;   CORONARY PRESSURE/FFR STUDY N/A 03/21/2020   Procedure: INTRAVASCULAR PRESSURE WIRE/FFR STUDY;  Surgeon: Yvonne Kendall, MD;  Location: [facility];  Service: Cardiovascular;  Laterality: N/A;   ESOPHAGOGASTRODUODENOSCOPY (EGD) WITH PROPOFOL N/A 06/02/2021   Procedure: ESOPHAGOGASTRODUODENOSCOPY (EGD) WITH PROPOFOL;  Surgeon: Meridee Score Netty Starring., MD;  Location: [facility];  Service: Gastroenterology;  Laterality: N/A;   HERNIA REPAIR     right     LEFT HEART CATH AND CORONARY ANGIOGRAPHY N/A 03/21/2020   Procedure: LEFT HEART CATH AND CORONARY ANGIOGRAPHY;  Surgeon: Yvonne Kendall, MD;  Location: [facility];  Service: Cardiovascular;  Laterality: N/A;   PENILE PROSTHESIS IMPLANT  1995   PERCUTANEOUS CORONARY STENT INTERVENTION (PCI-S)     x 7-8 heart   PERIPHERAL ARTERIAL STENT GRAFT     PROSTATE BIOPSY     pvd     with stenting x 2 right leg below knee and 1 left thigh    REMOVAL OF PENILE PROSTHESIS N/A 11/11/2021   Procedure: REMOVAL  OF PENILE PROSTHESIS AND REPLACEMENT OF PENILE PROSTHESIS [employer];  Surgeon: Despina Arias, MD;  Location: [facility];  Service: Urology;  Laterality: N/A;   SHOULDER ARTHROSCOPY Bilateral    TRIGGER FINGER RELEASE Left    UPPER ESOPHAGEAL ENDOSCOPIC ULTRASOUND (EUS) N/A 06/02/2021   Procedure: UPPER ESOPHAGEAL ENDOSCOPIC ULTRASOUND (EUS);  Surgeon: Lemar Lofty., MD;  Location: [facility];  Service: Gastroenterology;  Laterality: N/A;   VITRECTOMY Left    left eye   Social History   Socioeconomic History   Marital status: Married    Spouse name: Erik Nicholson   Number of children: 2   Years of education: Not on file   Highest education level: GED or equivalent  Occupational History   Occupation: retired  Tobacco Use   Smoking status: Former    Current packs/day: 0.00    Average packs/day: 2.0 packs/day for 40.0 years (80.0 ttl pk-yrs)    Types: Cigarettes    Start date: 04/20/1958    Quit date: 04/20/1998    Years since quitting: 24.9   Smokeless tobacco: Never   Tobacco comments:    quit in 2000s   Vaping Use   Vaping status: Never Used  Substance and Sexual Activity   Alcohol use: Yes    Comment: rare   Drug use: Not Currently   Sexual activity: Yes  Other Topics Concern   Not on file  Social History Narrative   Moved from [place] in 10/2018   2 sons in 46s as of 11/24/2018    Married wife is DPR Erik Nicholson x 45 years as of 04/2019    Social Determinants of Health   Financial Resource Strain: Low Risk  (11/23/2022)   Overall Financial Resource Strain (CARDIA)    Difficulty of Paying Living Expenses: Not hard at all  Food Insecurity: No Food Insecurity (11/23/2022)   Hunger Vital Sign    Worried About Running Out of Food in the Last Year: Never true    Ran Out of Food in the Last Year: Never true  Transportation Needs: No Transportation Needs (11/23/2022)   PRAPARE - Administrator, Civil Service (Medical): No    Lack of Transportation (Non-Medical): No  Physical Activity: Insufficiently Active (11/23/2022)   Exercise Vital Sign    Days of Exercise per Week: 1 day    Minutes of Exercise per Session: 10 min  Stress: No Stress Concern Present (11/23/2022)   [employer] of Occupational Health - Occupational Stress Questionnaire    Feeling of Stress : Not at all  Social Connections: Moderately Integrated (11/23/2022)   Social Connection and Isolation Panel [NHANES]    Frequency of Communication with Friends and Family: Twice a week    Frequency of Social Gatherings with Friends and Family: Once a week    Attends Religious Services: 1 to 4 times per year    Active Member of [employer] or Organizations: No    Attends Banker Meetings: Never    Marital Status: Married  Recent Concern: Social Connections - Moderately Isolated (10/12/2022)   Social Connection and Isolation Panel [NHANES]    Frequency of Communication with Friends and Family: More than three times a week    Frequency of Social Gatherings with Friends and Family: More than three times a week    Attends Religious Services: Never    Database administrator or Organizations: No    Attends Banker Meetings: Never    Marital Status: Married  Intimate Partner Violence: Not At Risk (10/12/2022)   Humiliation, Afraid, Rape, and Kick questionnaire    Fear of Current or Ex-Partner: No    Emotionally Abused: No    Physically Abused: No     Sexually Abused: No   Family History  Problem Relation Age of Onset   Heart disease Mother    Stroke Mother    Breast cancer Mother    Other Father        no info on him or his health   Diabetes Sister    Diabetes Brother    Colon cancer Neg Hx    Esophageal cancer Neg Hx    Inflammatory bowel disease Neg Hx    Liver disease Neg Hx    Pancreatic cancer Neg Hx    Rectal cancer Neg Hx    Stomach cancer Neg Hx    I have reviewed his medical, social, and family history in detail and updated the electronic medical record as necessary.    PHYSICAL EXAMINATION  BP 138/60   Pulse 63   Ht 5\' 7"  (1.702 m)   Wt 188 lb (85.3 kg)   BMI 29.44 kg/m  Wt Readings from Last 3 Encounters:  03/23/23 188 lb (85.3 kg)  01/06/23 190 lb (86.2 kg)  12/17/22 192 lb (87.1 kg)  GEN: NAD, appears stated age, doesn't appear chronically ill PSYCH: Cooperative, without pressured speech EYE: Conjunctivae pink, sclerae anicteric ENT: MMM, no oral lesions noted CV: Nontachycardic RESP: No audible wheezing GI: NABS, soft, NT, protuberant, without rebound or guarding MSK/EXT: No lower extremity edema SKIN: No jaundice NEURO:  Alert & Oriented x 3, no focal deficits   REVIEW OF DATA  I reviewed the following data at the time of this encounter:  GI Procedures and Studies  No new GI studies to review  Laboratory Studies  Reviewed those in epic  Imaging Studies  July 2024 abdominal ultrasound IMPRESSION: 1. Cirrhosis. No discrete liver lesion. 2. Otherwise normal ultrasound appearance of the abdomen.   ASSESSMENT  Mr. Cordaro Rensberger is a 72 y.o. male with a pmh significant for reported pan ulcerative colitis (dx >16 years ago), CAD (on Plavix), PAD status post stenting, hypertension, hyperlipidemia, chronic back pain, BPH, prostate cancer status post brachytherapy, prior hepatitis C (status post treatment), possible pancreatic gastric rest, penile implant, osteoarthritis.  The patient is seen  today for evaluation and management of:  No diagnosis found.    Assessment and Plan    Ulcerative Colitis vs. Irritable Bowel Syndrome (IBS)   He experiences intermittent episodes of semi-soft to liquid stools with abdominal pain, but no blood in stools, with the last flare occurring two days ago. These symptoms are inconsistent with typical ulcerative colitis flares. Previous colonoscopies showed no active inflammation, suggesting a possible IBS overlap. He is currently on mesalamine 4.8 grams daily and has recently completed a prednisone taper. We discussed the short-term use of prednisone during flares and the potential use of antispasmodics like Bentyl or Levsin to minimize steroid use. A fecal calprotectin test will assess for active inflammation. If the colonoscopy shows no active disease, stronger medications will be unnecessary; however, if active inflammation is found, we will consider medication adjustments. We will prescribe a 10-day prednisone taper with refills, continue mesalamine 4.8 grams daily, and consider Bentyl or Levsin for antispasmodic use during episodes. A colonoscopy and endoscopy are scheduled for early 2025 to reassess disease status.  Chronic Liver Disease vs. Compensated Cirrhosis   He was treated for Hepatitis  C, with persistently positive antibody results. Previous imaging showed liver stiffness around 10 kPa, indicating possible chronic liver disease or compensated cirrhosis, with no overt signs of cirrhosis but increased liver nodularity. The plan includes monitoring liver status with regular imaging and elastography. If liver stiffness exceeds 14 kPa, there will be a higher concern for cirrhosis. Current imaging shows no significant progression, but continued monitoring is necessary. We will continue biannual liver ultrasound and repeat elastography in early 2025 to reassess liver stiffness. Monitoring for signs of liver disease progression will occur during the  upcoming endoscopy.  General Health Maintenance   The importance of regular monitoring and follow-up for chronic conditions was emphasized. Blood work to assess kidney function, blood counts, and inflammatory markers is ordered. He will pick up a stool kit for the fecal calprotectin test and return it after his trip if not possible before.  Follow-up   A colonoscopy and endoscopy are scheduled for February or March 2025. He will follow up with blood work and stool test results after his trip.       The patient is hemodynamically and clinically stable from a GI perspective.  From his underlying IBD, he looks to be in remission at this time and will continue his current Lialda therapy.  The previous subepithelial lesion was evaluated via EUS recently and showed no evidence of a mass or lesion and will no longer require any follow-up.  He will be due for endoscopy surveillance in the setting of multiple duodenal ulcers this year.  As he has an upcoming urological surgery coming up he would like to defer this until after and that is okay because we will also allow cardiology to further evaluate and ensure there are no other things that are needed before anesthesia procedures occur.  The patient's imaging has been suggestive of potential underlying cirrhosis though he has no splenomegaly.  His fib 4 calculation is 1.48 and overall indeterminate.  I think because of his prior history of hepatitis C and his increasing weight, we should be cognizant about the potential of underlying chronic liver disease or scarring that may be occurring.  Thankfully he has no evidence of portal gastropathy or varices on that recent endoscopy.  We will plan to hold on liver biopsy for now and try to further evaluate things with liver elastography but consider liver biopsy if issues are not fully determined by the liver elastography.  He will try to continue to decrease alcohol intake at this time.  We will obtain some updated  hepatitis serologies as well for now.  All patient questions were answered to the best of my ability, and the patient agrees to the aforementioned plan of action with follow-up as indicated.   PLAN  Laboratories as outlined below Continue Lialda 2.4 g daily for now Repeat colonoscopy in 2025 for surveillance Liver elastography to be performed Hold on liver biopsy for now Minimize alcohol consumption as able EGD to be scheduled in September for follow-up duodenal ulcers   No orders of the defined types were placed in this encounter.    New Prescriptions   No medications on file   Modified Medications   No medications on file    Planned Follow Up No follow-ups on file.  Total Time in Face-to-Face and in Coordination of Care for patient including independent/personal interpretation/review of prior testing, medical history, examination, medication adjustment, communicating results with the patient directly, and documentation with the EHR is 25 minutes.   Corliss Parish, MD [place]  [facility] Office # [phone]

## 2023-03-26 ENCOUNTER — Encounter: Payer: Self-pay | Admitting: Gastroenterology

## 2023-03-26 DIAGNOSIS — K769 Liver disease, unspecified: Secondary | ICD-10-CM | POA: Insufficient documentation

## 2023-03-26 DIAGNOSIS — R152 Fecal urgency: Secondary | ICD-10-CM | POA: Insufficient documentation

## 2023-03-27 DIAGNOSIS — K588 Other irritable bowel syndrome: Secondary | ICD-10-CM | POA: Insufficient documentation

## 2023-04-05 ENCOUNTER — Other Ambulatory Visit: Payer: Self-pay | Admitting: Internal Medicine

## 2023-04-19 ENCOUNTER — Other Ambulatory Visit (INDEPENDENT_AMBULATORY_CARE_PROVIDER_SITE_OTHER): Payer: Medicare Other

## 2023-04-19 ENCOUNTER — Other Ambulatory Visit: Payer: Self-pay

## 2023-04-19 ENCOUNTER — Telehealth: Payer: Self-pay

## 2023-04-19 DIAGNOSIS — K5100 Ulcerative (chronic) pancolitis without complications: Secondary | ICD-10-CM | POA: Diagnosis not present

## 2023-04-19 DIAGNOSIS — K769 Liver disease, unspecified: Secondary | ICD-10-CM

## 2023-04-19 LAB — IBC + FERRITIN
Ferritin: 87 ng/mL (ref 22.0–322.0)
Iron: 80 ug/dL (ref 42–165)
Saturation Ratios: 21.3 % (ref 20.0–50.0)
TIBC: 375.2 ug/dL (ref 250.0–450.0)
Transferrin: 268 mg/dL (ref 212.0–360.0)

## 2023-04-19 LAB — VITAMIN B12: Vitamin B-12: 753 pg/mL (ref 211–911)

## 2023-04-19 LAB — FOLATE: Folate: 16 ng/mL (ref >5.9)

## 2023-04-19 NOTE — Telephone Encounter (Signed)
Request for surgical clearance:     Endoscopy Procedure  What type of surgery is being performed?     Colon +EGD    When is this surgery scheduled?     06/11/23  What type of clearance is required ?   Pharmacy  Are there any medications that need to be held prior to surgery and how long? Plavix x5 days prior to procedure  Practice name and name of physician performing surgery?      [facility]  What is your office phone and fax number?      Phone- [phone]  Fax- [phone]  Anesthesia type (None, local, MAC, general) ?       MAC  Please route your response to Lucky Rathke, CMA

## 2023-04-19 NOTE — Telephone Encounter (Signed)
   Name: Erik Nicholson  DOB: 11-Nov-1950  MRN: [id number]  Primary Cardiologist: Yvonne Kendall, MD  Chart reviewed as part of pre-operative protocol coverage. Because of Erik Nicholson's past medical history and time since last visit, he will require a follow-up in-office visit in order to better assess preoperative cardiovascular risk.  Pre-op covering staff: - Please schedule appointment and call patient to inform them. If patient already had an upcoming appointment within acceptable timeframe, please add "pre-op clearance" to the appointment notes so provider is aware. - Please contact requesting surgeon's office via preferred method (i.e, phone, fax) to inform them of need for appointment prior to surgery.  He is on plavix for his hx of extensive PAD/CAD. He will need an in person appointment before holding parameters can be addressed.   Sharlene Dory, PA-C  04/19/2023, 4:10 PM

## 2023-04-19 NOTE — Telephone Encounter (Signed)
Patient is scheduled for 05/11/2023 @ 3:35pm with Charlsie Quest, NP

## 2023-04-22 NOTE — Telephone Encounter (Signed)
Thank you for update. GM 

## 2023-05-06 ENCOUNTER — Telehealth: Payer: Self-pay

## 2023-05-06 DIAGNOSIS — Z8619 Personal history of other infectious and parasitic diseases: Secondary | ICD-10-CM

## 2023-05-06 DIAGNOSIS — K7469 Other cirrhosis of liver: Secondary | ICD-10-CM

## 2023-05-06 DIAGNOSIS — K769 Liver disease, unspecified: Secondary | ICD-10-CM

## 2023-05-06 NOTE — Telephone Encounter (Signed)
-----   Message from Nurse Telesia Ates P sent at 11/03/2022  1:01 PM EDT ----- 60-month HCC screening with right upper quadrant ultrasound is reasonable. He should see [place] in clinic since we have not seen him in almost a year. Have that [facility] screening completed and then he can see [place] after it has been completed. Thanks.

## 2023-05-07 NOTE — Telephone Encounter (Signed)
The pt has been contacted and agrees to the [place]-order entered and sent to the schedulers

## 2023-05-10 ENCOUNTER — Other Ambulatory Visit: Payer: Medicare Other

## 2023-05-10 DIAGNOSIS — K769 Liver disease, unspecified: Secondary | ICD-10-CM

## 2023-05-10 DIAGNOSIS — K5100 Ulcerative (chronic) pancolitis without complications: Secondary | ICD-10-CM

## 2023-05-10 DIAGNOSIS — R152 Fecal urgency: Secondary | ICD-10-CM

## 2023-05-11 ENCOUNTER — Encounter: Payer: Self-pay | Admitting: Cardiology

## 2023-05-11 ENCOUNTER — Ambulatory Visit: Payer: Medicare Other | Attending: Cardiology | Admitting: Cardiology

## 2023-05-11 VITALS — BP 132/60 | HR 63 | Ht 67.0 in | Wt 186.6 lb

## 2023-05-11 DIAGNOSIS — I5032 Chronic diastolic (congestive) heart failure: Secondary | ICD-10-CM | POA: Diagnosis not present

## 2023-05-11 DIAGNOSIS — I739 Peripheral vascular disease, unspecified: Secondary | ICD-10-CM | POA: Diagnosis present

## 2023-05-11 DIAGNOSIS — I10 Essential (primary) hypertension: Secondary | ICD-10-CM | POA: Insufficient documentation

## 2023-05-11 DIAGNOSIS — I2510 Atherosclerotic heart disease of native coronary artery without angina pectoris: Secondary | ICD-10-CM | POA: Diagnosis present

## 2023-05-11 DIAGNOSIS — Z01810 Encounter for preprocedural cardiovascular examination: Secondary | ICD-10-CM | POA: Diagnosis not present

## 2023-05-11 DIAGNOSIS — E785 Hyperlipidemia, unspecified: Secondary | ICD-10-CM | POA: Insufficient documentation

## 2023-05-11 NOTE — Progress Notes (Signed)
Cardiology Office Note:  .   Date:  05/11/2023  ID:  Erik Nicholson, DOB 1950/04/21, MRN [id number] PCP: Dana Allan, MD  [facility] Providers Cardiologist:  Yvonne Kendall, MD    History of Present Illness: .   Erik Nicholson is a 73 y.o. male with a past medical history of coronary artery disease status post multiple PCI's, HFpEF, PVD status post bilateral lower extremity stenting followed by vascular surgery, ulcerative colitis, COVID infection in 10/2020, hepatitis C, probable COPD, hypertension, hyperlipidemia, prostate cancer, BPH, who presents today for follow-up and requires preoperative cardiovascular examination.   CAD and PVD, notes indicate he has a history of 7 coronary and 3 lower extremity stents while living in the [place] area.  Most recent LHC in 03/2020 demonstrated severe single-vessel CAD with up to 75% ISR of the mid/distal RCA stent as well as 60 to 70% ostial stenosis of RPDA and RPL 1.  There was mild to moderate noncritical left coronary disease with most severe lesion located in a small to moderate caliber D1 branch that was jailed by prior LAD stent with up to 70% stenosis.  Normal LV systolic function with mildly elevated filling pressures.  He underwent PTCA of the mid/distal RCA with reduction in stenosis from 75% to 20% with TIMI-3 flow.  Stent placement was not attempted due to multiple layers of stent already present and incomplete expansion of a 3.5 mm noncompliant balloon at 22 ATM.  With percutaneous intervention he did note improvement in chest pain, though did continue to have significant exertional dyspnea.  PFTs showed probable small obstructive airway disease with bronchodilator response, for which he is followed by pulmonology.  Echo from 05/14/2020 showed an EF of 55 to 60%, no regional wall motion abnormalities, mild LVH, grade 1 diastolic dysfunction, normal RV systolic function and ventricular cavity size, trivial mitral regurgitation, and an  estimated right atrial pressure of 3 mmHg.  PFTs in 05/2020 showed evidence of obstructive airway disease.  Was seen in the office 11/18/2020 continue to note chronic exertional dyspnea.  He was euvolemic on exam but given his symptoms underwent Lexiscan MPI 11/25/2020 which showed no significant ischemia was low risk study with an EF of 55 to 65%.  He wore an event monitor 02/2021 which revealed predominant rhythm of sinus with an average heart rate of 77 bpm, a single 4 beat run of NSVT occurred with maximal rate of 193 bpm, 6 atrial runs lasting up to 7 beats at a maximal rate of 139 bpm with rare PACs and PVCs with no sustained arrhythmias or prolonged pauses.   He was last seen in clinic 11/09/2022 where he had no symptoms of angina or cardiac decompensation.  His chronic dyspnea was stable and improved with his albuterol inhaler usage.  He had not been on amlodipine for several months and his blood pressures in the 130s systolic.  He was continued on his current medication regimen and no further testing was ordered at that time.  He returns to clinic today stating that he has been doing well. He continues to have the same chronic dyspnea that is relieved with his inhaler.  He denies any anginal.  Denies any progressive lower extremity swelling, orthopnea, or currently sedentary.  Denies any dizziness, lightheadedness, syncope or near syncope.  States that he has been compliant with his current medication regimen.  He states he is only here today require any clearance to have his colonoscopy.  States that he has an extensive  history of ulcerative colitis and has a colonoscopy about every year. Denies any hospitalizations or visits to the emergency department  ROS: 10 point review of systems has been reviewed and considered negative except what is been listed in the HPI  Studies Reviewed: Marland Kitchen   EKG Interpretation Date/Time:  Tuesday May 11 2023 15:12:15 EST Ventricular Rate:  63 PR Interval:  214 QRS  Duration:  94 QT Interval:  418 QTC Calculation: 427 R Axis:   -16  Text Interpretation: Sinus rhythm with 1st degree A-V block Inferior infarct (cited on or before 31-Oct-2020) When compared with ECG of 09-Nov-2022 13:44, No significant change was found Confirmed by Charlsie Quest ([id number]) on 05/11/2023 3:31:00 PM    2D echo 05/01/2022: 1. Left ventricular ejection fraction, by estimation, is 55 to 60%. The  left ventricle has normal function. The left ventricle has no regional  wall motion abnormalities. Left ventricular diastolic parameters are  consistent with Grade I diastolic  dysfunction (impaired relaxation). The average left ventricular global  longitudinal strain is -16.4 %.   2. Right ventricular systolic function is normal. The right ventricular  size is normal. There is normal pulmonary artery systolic pressure. The  estimated right ventricular systolic pressure is 20.3 mmHg.   3. The mitral valve is normal in structure. No evidence of mitral valve  regurgitation. No evidence of mitral stenosis.   4. The aortic valve is tricuspid. Aortic valve regurgitation is not  visualized. No aortic stenosis is present.   5. The inferior vena cava is normal in size with greater than 50%  respiratory variability, suggesting right atrial pressure of 3 mmHg.    Zio patch 02/2021: The patient was monitored for 12 days, 23 hours. Predominant rhythm was sinus with an average rate of 77 bpm (range 52-118 bpm in sinus). There were rare PACs and PVCs. A single 4 beat run of nonsustained ventricular tachycardia occurred, with a maximum rate of 193 bpm. There were six atrial runs lasting up to 7 beats with a maximum rate of 139 bpm. No sustained arrhythmia or prolonged pause was observed. Patient triggered events correspond to sinus rhythm and PACs.   Predominantly sinus rhythm with rare PACs and PVCs.  A few brief episodes of PSVT and NSVT occurred.   Lexiscan MPI 11/2020: ST segment elevation  was noted during stress. No T wave inversion was noted during stress. The study is normal. This is a low risk study. The left ventricular ejection fraction is normal (55-65%). CT attenuation images showed significant coronary calcifications and mild aortic calcifications.   2D echo 05/14/2020: 1. Left ventricular ejection fraction, by estimation, is 55 to 60%. The  left ventricle has normal function. The left ventricle has no regional  wall motion abnormalities. There is mild left ventricular hypertrophy.  Left ventricular diastolic parameters  are consistent with Grade I diastolic dysfunction (impaired relaxation).  The average left ventricular global longitudinal strain is -16.3 %.   2. Right ventricular systolic function is normal. The right ventricular  size is normal.   3. The mitral valve is grossly normal. Trivial mitral valve  regurgitation. No evidence of mitral stenosis.   4. The aortic valve was not well visualized. Aortic valve regurgitation  is not visualized. No aortic stenosis is present.   5. The inferior vena cava is normal in size with greater than 50%  respiratory variability, suggesting right atrial pressure of 3 mmHg.   LHC 03/21/2020: Conclusions: Severe single-vessel coronary artery disease with up to 75% in-stent  restenosis of the mid/distal RCA (FFR 0.70), as well as 60-70% ostial stenoses of rPDA and rPL1. Mild to moderate, non-critical left coronary artery disease.  The most severe lesion is a small to moderate caliber D1 branch that is jailed by LAD stent with up to 70% stenosis. Normal left ventricular systolic function with mildly elevated filling pressure. Successful PTCA of mid/distal RCA with reduction in stenosis from 75% to 20% with TIMI-3 flow.  Stent placement not attempted due to multiple layers of stent already present and incomplete expansion of 3.5 mm non-compliant balloon at 22 atm.   Recommendations: Continue aggressive secondary  prevention. Indefinite dual antiplatelet therapy with aspirin and clopidogrel; discontinuation of aspirin could be considered after 6 months of therapy. Anticipate same-day discharge if no post-catheterization complication occur.   Lexiscan MPI 03/07/2019: Normal pharmacologic myocardial perfusion stress test without significant ischemia or scar. The left ventricular ejection fraction is normal by visual estimation and Siemens calculation (56%). Dense coronary artery calcifications and/or prior coronary stents are noted on the attenuation correction CT. This is a low risk study.   2D echo 03/02/2019: 1. Left ventricular ejection fraction, by visual estimation, is 60 to  65%. The left ventricle has normal function. There is no left ventricular  hypertrophy.   2. Left ventricular diastolic parameters are consistent with Grade I  diastolic dysfunction (impaired relaxation).   3. Global right ventricle has normal systolic function.The right  ventricular size is normal. No increase in right ventricular wall  thickness.   4. Left atrial size was normal.   5. Normal pulmonary artery systolic pressure.   LHC 02/25/2012 [facility]): Patent stents in the proximal and mid RCA, 40% mid RCA stenosis, normal left heart pressures, normal LV systolic function   Risk Assessment/Calculations:             Physical Exam:   VS:  BP 132/60   Pulse 63   Ht 5\' 7"  (1.702 m)   Wt 186 lb 9.6 oz (84.6 kg)   SpO2 98%   BMI 29.23 kg/m    Wt Readings from Last 3 Encounters:  05/11/23 186 lb 9.6 oz (84.6 kg)  03/23/23 188 lb (85.3 kg)  01/06/23 190 lb (86.2 kg)    GEN: Well nourished, well developed in no acute distress NECK: No JVD; No carotid bruits CARDIAC: RRR, no murmurs, rubs, gallops RESPIRATORY:  Clear to auscultation without rales, wheezing or rhonchi  ABDOMEN: Soft, non-tender, non-distended EXTREMITIES:  No edema; No deformity   ASSESSMENT AND PLAN: .   Coronary  artery disease involving native coronary arteries without angina and chronic dyspnea.  He is doing well without symptoms of angina or anginal equivalents.  He has a longstanding history of exertional dyspnea that is stable with use of his inhaler.  He is continued on aspirin 81 mg daily, clopidogrel 75 mg daily, and atorvastatin 40 mg daily.  EKG completed today reveals sinus rhythm with a rate of 63 bpm first-degree AV block with no acute change.  No further ischemic evaluation is needed at this time.  HFpEF with an LVEF of 55 to 60% on echocardiogram 05/01/2022.  G1 DD, no valvular abnormalities noted.  He is euvolemic on exam.  NYHA class II symptoms.  He does not require diuretic therapy.  We have deferred the addition of SGLT2 inhibitor and MRA in the setting of prior hypotension.  If patient develop heart failure symptoms moving forward would like to escalate GDMT.  Hypertension with blood pressure today 132/60.  Blood pressure has been well-controlled.  He is continued on Toprol-XL 100 mg daily and telmisartan HCTZ 80/12.5 mg daily.  He has been encouraged to continue to monitor his blood pressure 1 to 2 hours postmedication administration at home as well.  Mixed hyperlipidemia with his last direct LDL 59 on 10/2022.  He is continued on atorvastatin 40 mg daily.  Peripheral arterial disease with no symptoms concerning for claudication.  Continues to be followed by [facility].  Remains on atorvastatin and aspirin therapy.  Preoperative cardiovascular examination    Mr. Zenaida Niece Hagen's perioperative risk of a major cardiac event is 0.9% according to the Revised Cardiac Risk Index (RCRI).  Therefore, he is at high risk for perioperative complications.   His functional capacity is fair at 5.19 METs according to the Duke Activity Status Index (DASI). Recommendations: According to ACC/AHA guidelines, no further cardiovascular testing needed.  The patient may proceed to surgery at acceptable risk.   Antiplatelet  and/or Anticoagulation Recommendations: Clopidogrel (Plavix) can be held for 5 days prior to his surgery and resumed as soon as possible post op.        Dispo: Patient return to clinic to see MD/APP in 6 months or sooner if needed  Signed, Tobey Schmelzle, NP

## 2023-05-11 NOTE — Patient Instructions (Signed)
 Medication Instructions:  The current medical regimen is effective;  continue present plan and medications.  *If you need a refill on your cardiac medications before your next appointment, please call your pharmacy*   Follow-Up: At [facility], you and your health needs are our priority.  As part of our continuing mission to provide you with exceptional heart care, we have created designated Provider Care Teams.  These Care Teams include your primary Cardiologist (physician) and Advanced Practice Providers (APPs -  Physician Assistants and Nurse Practitioners) who all work together to provide you with the care you need, when you need it.  We recommend signing up for the patient portal called "MyChart".  Sign up information is provided on this After Visit Summary.  MyChart is used to connect with patients for Virtual Visits (Telemedicine).  Patients are able to view lab/test results, encounter notes, upcoming appointments, etc.  Non-urgent messages can be sent to your provider as well.   To learn more about what you can do with MyChart, go to [email or web address].    Your next appointment:   6 month(s)  Provider:   You may see Yvonne Kendall, MD or one of the following Advanced Practice Providers on your designated Care Team:   Nicolasa Ducking, NP Eula Listen, PA-C Cadence Fransico Michael, PA-C Charlsie Quest, NP Carlos Levering, NP

## 2023-05-12 ENCOUNTER — Other Ambulatory Visit: Payer: Self-pay | Admitting: Family Medicine

## 2023-05-12 ENCOUNTER — Telehealth: Payer: Self-pay

## 2023-05-12 DIAGNOSIS — J9801 Acute bronchospasm: Secondary | ICD-10-CM

## 2023-05-12 DIAGNOSIS — U071 COVID-19: Secondary | ICD-10-CM

## 2023-05-12 NOTE — Telephone Encounter (Signed)
Antiplatelet and/or Anticoagulation Recommendations: Clopidogrel (Plavix) can be held for 5 days prior to his surgery and resumed as soon as possible post op.   Patient has been informed and voiced understanding.

## 2023-05-12 NOTE — Telephone Encounter (Signed)
Antiplatelet and/or Anticoagulation Recommendations: Clopidogrel (Plavix) can be held for 5 days prior to his surgery and resumed as soon as possible post op.

## 2023-05-12 NOTE — Telephone Encounter (Signed)
-----   Message from [facility] sent at 05/12/2023  1:32 PM EST ----- Regarding: FW: FYI ----- Message ----- From: Charlsie Quest, NP Sent: 05/11/2023   4:43 PM EST To: Lemar Lofty., MD  Patient may proceed with acceptable risk for his upcoming procedure.  There is no further cardiac testing that is required prior to. Thanks, [employer]

## 2023-05-12 NOTE — Telephone Encounter (Signed)
Ro, See the message regarding cardiology clearance.  You were working on this and waiting for a response.

## 2023-05-13 LAB — CALPROTECTIN, FECAL: Calprotectin, Fecal: 41 ug/g (ref 0–120)

## 2023-05-19 ENCOUNTER — Encounter: Payer: Self-pay | Admitting: Gastroenterology

## 2023-05-21 ENCOUNTER — Other Ambulatory Visit: Payer: Self-pay | Admitting: Gastroenterology

## 2023-05-21 DIAGNOSIS — E7800 Pure hypercholesterolemia, unspecified: Secondary | ICD-10-CM

## 2023-05-21 NOTE — Telephone Encounter (Signed)
Prescriber for this medication is not at this practice.

## 2023-05-27 ENCOUNTER — Other Ambulatory Visit: Payer: Self-pay | Admitting: Gastroenterology

## 2023-06-08 ENCOUNTER — Encounter: Payer: Self-pay | Admitting: Certified Registered Nurse Anesthetist

## 2023-06-11 ENCOUNTER — Other Ambulatory Visit: Payer: Self-pay | Admitting: Gastroenterology

## 2023-06-11 ENCOUNTER — Encounter: Payer: Self-pay | Admitting: Gastroenterology

## 2023-06-11 ENCOUNTER — Ambulatory Visit: Payer: Medicare Other | Admitting: Gastroenterology

## 2023-06-11 VITALS — BP 138/84 | HR 72 | Temp 97.7°F | Resp 17 | Ht 67.0 in | Wt 186.0 lb

## 2023-06-11 DIAGNOSIS — K2970 Gastritis, unspecified, without bleeding: Secondary | ICD-10-CM

## 2023-06-11 DIAGNOSIS — K621 Rectal polyp: Secondary | ICD-10-CM | POA: Diagnosis not present

## 2023-06-11 DIAGNOSIS — Z1211 Encounter for screening for malignant neoplasm of colon: Secondary | ICD-10-CM | POA: Diagnosis not present

## 2023-06-11 DIAGNOSIS — K5100 Ulcerative (chronic) pancolitis without complications: Secondary | ICD-10-CM

## 2023-06-11 DIAGNOSIS — K3189 Other diseases of stomach and duodenum: Secondary | ICD-10-CM

## 2023-06-11 DIAGNOSIS — K269 Duodenal ulcer, unspecified as acute or chronic, without hemorrhage or perforation: Secondary | ICD-10-CM

## 2023-06-11 DIAGNOSIS — K31819 Angiodysplasia of stomach and duodenum without bleeding: Secondary | ICD-10-CM | POA: Diagnosis not present

## 2023-06-11 DIAGNOSIS — K641 Second degree hemorrhoids: Secondary | ICD-10-CM | POA: Diagnosis not present

## 2023-06-11 DIAGNOSIS — K769 Liver disease, unspecified: Secondary | ICD-10-CM

## 2023-06-11 MED ORDER — SODIUM CHLORIDE 0.9 % IV SOLN: 500.0000 mL | Freq: Once | INTRAVENOUS | Status: DC

## 2023-06-11 MED ORDER — ESOMEPRAZOLE MAGNESIUM 40 MG PO CPDR: 40.0000 mg | DELAYED_RELEASE_CAPSULE | Freq: Every day | ORAL | 6 refills | Status: DC

## 2023-06-11 NOTE — Progress Notes (Signed)
 Called to room to assist during endoscopic procedure.  Patient ID and intended procedure confirmed with present staff. Received instructions for my participation in the procedure from the performing physician.

## 2023-06-11 NOTE — Progress Notes (Signed)
1459 BP 177/92, Labetalol given IV, MD update, vss

## 2023-06-11 NOTE — Progress Notes (Signed)
 Pt's states no medical or surgical changes since previsit or office visit.

## 2023-06-11 NOTE — Op Note (Signed)
[facility] Patient Name: Erik Nicholson Procedure Date: 06/11/2023 2:33 PM MRN: [id number] Endoscopist: Corliss Parish , MD, [id number] Age: 73 Referring MD:  Date of Birth: [birth year] Gender: Male Account #: [id number] Procedure:                Upper GI endoscopy Indications:              Cirrhosis rule out esophageal varices Medicines:                Monitored Anesthesia Care Procedure:                Pre-Anesthesia Assessment:                           - Prior to the procedure, a History and Physical                            was performed, and patient medications and                            allergies were reviewed. The patient's tolerance of                            previous anesthesia was also reviewed. The risks                            and benefits of the procedure and the sedation                            options and risks were discussed with the patient.                            All questions were answered, and informed consent                            was obtained. Prior Anticoagulants: The patient                            last took Plavix (clopidogrel) 5 days prior to the                            procedure and has taken no anticoagulant or                            antiplatelet agents except for aspirin. ASA Grade                            Assessment: II - A patient with mild systemic                            disease. After reviewing the risks and benefits,                            the patient was deemed in satisfactory condition to  undergo the procedure.                           After obtaining informed consent, the endoscope was                            passed under direct vision. Throughout the                            procedure, the patient's blood pressure, pulse, and                            oxygen saturations were monitored continuously. The                            Olympus Scope [id number] was  introduced through the                            mouth, and advanced to the second part of duodenum.                            The upper GI endoscopy was accomplished without                            difficulty. The patient tolerated the procedure. Scope In: Scope Out: Findings:                 No gross lesions were noted in the entire                            esophagus. No evidence of varices.                           The Z-line was regular and was found 40 cm from the                            incisors.                           Extrinsic compression on the stomach was found in                            the gastric antrum (previously evaluated with EUS                            and consistent with gallbladder impression).                           Patchy mild inflammation characterized by erosions                            and erythema was found in the entire examined                            stomach. Biopsies were taken with a  cold forceps                            for histology and Helicobacter pylori testing.                           A single small angioectasia with typical                            arborization was found in the D1/D2 angle.                           Multiple erosions without bleeding were found in                            the duodenal bulb, in the first portion of the                            duodenum and in the second portion of the duodenum.                            Biopsies were taken with a cold forceps for                            histology. Complications:            No immediate complications. Estimated Blood Loss:     Estimated blood loss was minimal. Impression:               - No gross lesions in the entire esophagus. No                            varices noted.                           - Z-line regular, 40 cm from the incisors.                           - Extrinsic impression in the gastric antrum                             (negative EUS previously).                           - Gastritis. Biopsied.                           - A single angioectasia in the D1/D2 sweep.                           - Duodenal erosions without bleeding and visualized                            proximal duodenum. Biopsied. Recommendation:           - Proceed to scheduled colonoscopy.                           -  Continue present medications.                           - Await pathology results.                           - Start Nexium 40 mg once daily.                           - 3-year EGD recall for variceal screening in                            setting of chronic liver disease.                           - The findings and recommendations were discussed                            with the patient.                           - The findings and recommendations were discussed                            with the patient's family. Corliss Parish, MD 06/11/2023 3:12:08 PM

## 2023-06-11 NOTE — Progress Notes (Signed)
1435 Robinul 0.1 mg IV given due large amount of secretions upon assessment.  MD made aware, vss 

## 2023-06-11 NOTE — Op Note (Signed)
[facility] Patient Name: Erik Nicholson Procedure Date: 06/11/2023 2:32 PM MRN: [id number] Endoscopist: Corliss Parish , MD, [id number] Age: 73 Referring MD:  Date of Birth: 10/17/1950 Gender: Male Account #: [id number] Procedure:                Colonoscopy Indications:              Follow-up of chronic ulcerative pancolitis, Disease                            activity assessment of chronic ulcerative pancolitis Medicines:                Monitored Anesthesia Care Procedure:                Pre-Anesthesia Assessment:                           - Prior to the procedure, a History and Physical                            was performed, and patient medications and                            allergies were reviewed. The patient's tolerance of                            previous anesthesia was also reviewed. The risks                            and benefits of the procedure and the sedation                            options and risks were discussed with the patient.                            All questions were answered, and informed consent                            was obtained. Prior Anticoagulants: The patient                            last took Plavix (clopidogrel) 5 days prior to the                            procedure and has taken no anticoagulant or                            antiplatelet agents except for NSAID medication.                            ASA Grade Assessment: II - A patient with mild                            systemic disease. After reviewing the risks and  benefits, the patient was deemed in satisfactory                            condition to undergo the procedure.                           After obtaining informed consent, the colonoscope                            was passed under direct vision. Throughout the                            procedure, the patient's blood pressure, pulse, and                            oxygen  saturations were monitored continuously. The                            Olympus Scope SN: [id number] was introduced through                            the anus and advanced to the the cecum, identified                            by appendiceal orifice and ileocecal valve. The                            colonoscopy was performed without difficulty. The                            patient tolerated the procedure. The quality of the                            bowel preparation was adequate. The ileocecal                            valve, appendiceal orifice, and rectum were                            photographed. Scope In: 2:50:08 PM Scope Out: 3:04:13 PM Scope Withdrawal Time: 0 hours 11 minutes 15 seconds  Total Procedure Duration: 0 hours 14 minutes 5 seconds  Findings:                 The digital rectal exam findings include                            hemorrhoids. Pertinent negatives include no                            palpable rectal lesions.                           A large amount of semi-liquid stool was found in  the entire colon, interfering with visualization.                            Lavage of the area was performed using copious                            amounts, resulting in clearance with adequate                            visualization.                           Normal mucosa was found in the entire colon                            otherwise. Biopsies were taken with a cold forceps                            for histology from the right colon. Biopsies were                            taken with a cold forceps for histology from the                            left colon. Biopsies were taken with a cold forceps                            for histology from the rectum.                           Non-bleeding non-thrombosed external and internal                            hemorrhoids were found during retroflexion, during                             perianal exam and during digital exam. The                            hemorrhoids were Grade II (internal hemorrhoids                            that prolapse but reduce spontaneously). Complications:            No immediate complications. Estimated Blood Loss:     Estimated blood loss was minimal. Impression:               - Hemorrhoids found on digital rectal exam.                           - Stool in the entire examined colon. Lavaged with                            adequate visualization.                           -  Normal mucosa in the entire examined colon.                            Biopsied.                           - Non-bleeding non-thrombosed external and internal                            hemorrhoids. Recommendation:           - The patient will be observed post-procedure,                            until all discharge criteria are met.                           - Discharge patient to home.                           - Patient has a contact number available for                            emergencies. The signs and symptoms of potential                            delayed complications were discussed with the                            patient. Return to normal activities tomorrow.                            Written discharge instructions were provided to the                            patient.                           - High fiber diet.                           - May restart Plavix on 2/22 PM to decrease risk of                            post interventional bleeding.                           - Continue present medications.                           - Await pathology results.                           - Repeat colonoscopy for surveillance based on                            pathology results.                           -  The findings and recommendations were discussed                            with the patient.                           - The findings and recommendations  were discussed                            with the patient's family. Corliss Parish, MD 06/11/2023 3:15:58 PM

## 2023-06-11 NOTE — Progress Notes (Signed)
GASTROENTEROLOGY PROCEDURE H&P NOTE   Primary Care Physician: Dana Allan, MD  HPI: Erik Nicholson is a 73 y.o. male who presents for EGD/colonoscopy for evaluation of potential portal hypertension in the setting of chronic liver disease and for history of ulcerative pancolitis for evaluation of disease activity.  Past Medical History:  Diagnosis Date   Abdominal gas pain 12/23/2018   Abnormal gait 04/30/2021   Abnormal liver diagnostic imaging 09/16/2021   Abnormal MRI, lumbar spine 05/09/2021   Abnormal MRI, thoracic spine 05/09/2021   Abnormal weight loss 11/24/2018   Accelerating angina (HCC) 03/15/2020   Antiplatelet or antithrombotic long-term use 12/23/2018   Arthritis    both hands   Arthritis of right hip 05/05/2021   Benign prostatic hyperplasia with post-void dribbling 11/24/2018   BPH (benign prostatic hyperplasia)    CAD (coronary artery disease)    x 7 cardiac stents   Chronic back pain    L4/5 with chronic right leg pain    Chronic pain syndrome 11/24/2018   Chronic radicular lumbar pain (Right) 01/17/2019   Claudication in peripheral vascular disease (HCC) 01/07/2019   COVID-19    10/20/20   Dyspnea on exertion 08/17/2020   Floater, vitreous, left    Gastric nodule 05/31/2020   History of chronic ulcerative colitis 05/31/2020   History of lumbar surgery (x2) 01/17/2019   History of prostate cancer 02/10/2021   Hx of hepatitis C    treated with Harvoni in 2014   Hx of hepatitis C 12/23/2018   Hyperlipidemia    Hypertension    Intestinal metaplasia of gastric mucosa 12/23/2018   Leg edema 04/30/2021   Leg edema, right 05/03/2019   Lumbar radiculopathy, right 04/30/2021   Malignant neoplasm of prostate (HCC) 03/28/2019   Mid back pain 04/30/2021   Neuromuscular disorder (HCC)    nerve damage in back   Neuropathy    tingling in toes @ times   Other hemorrhoids 12/23/2018   Palpitations 02/10/2021   Prostate cancer (HCC)    fall 2022, radiation  therapy done dr Kathrynn Running   PVD (peripheral vascular disease) (HCC)    2 stents right leg and 1 in left    Rectal urgency 12/23/2018   Right hip pain 04/30/2021   Right leg weakness 05/03/2019   Right wrist pain 04/30/2021   S/P insertion of penile implant 11/11/2021   Subepithelial gastric lesion 03/25/2021   Ulcerative chronic pancolitis without complications (HCC) 12/23/2018   Ulcerative colitis (HCC)    in remission since fall of 2022   Ulcerative colitis with complication (HCC) 11/24/2018   Urinary incontinence 11/24/2018   Weakness of both lower extremities 04/30/2021   Weight gain 03/25/2021   Past Surgical History:  Procedure Laterality Date   back surgery     x 2 lumbar last 09/2016 in [place]   BIOPSY  06/02/2021   Procedure: BIOPSY;  Surgeon: Lemar Lofty., MD;  Location: [facility];  Service: Gastroenterology;;  EGD and COLON   CARDIAC CATHETERIZATION     CATARACT EXTRACTION, BILATERAL     COLONOSCOPY     last 09/2018 in [place]   COLONOSCOPY WITH PROPOFOL N/A 06/02/2021   Procedure: COLONOSCOPY WITH PROPOFOL;  Surgeon: Lemar Lofty., MD;  Location: [facility];  Service: Gastroenterology;  Laterality: N/A;   CORONARY BALLOON ANGIOPLASTY N/A 03/21/2020   Procedure: CORONARY BALLOON ANGIOPLASTY;  Surgeon: Yvonne Kendall, MD;  Location: [facility];  Service: Cardiovascular;  Laterality: N/A;   CORONARY PRESSURE/FFR STUDY  N/A 03/21/2020   Procedure: INTRAVASCULAR PRESSURE WIRE/FFR STUDY;  Surgeon: Yvonne Kendall, MD;  Location: [facility];  Service: Cardiovascular;  Laterality: N/A;   ESOPHAGOGASTRODUODENOSCOPY (EGD) WITH PROPOFOL N/A 06/02/2021   Procedure: ESOPHAGOGASTRODUODENOSCOPY (EGD) WITH PROPOFOL;  Surgeon: Meridee Score Netty Starring., MD;  Location: [facility];  Service: Gastroenterology;  Laterality: N/A;   HERNIA REPAIR     right    LEFT HEART CATH AND CORONARY ANGIOGRAPHY N/A 03/21/2020   Procedure: LEFT  HEART CATH AND CORONARY ANGIOGRAPHY;  Surgeon: Yvonne Kendall, MD;  Location: [facility];  Service: Cardiovascular;  Laterality: N/A;   PENILE PROSTHESIS IMPLANT  1995   PERCUTANEOUS CORONARY STENT INTERVENTION (PCI-S)     x 7-8 heart   PERIPHERAL ARTERIAL STENT GRAFT     PROSTATE BIOPSY     pvd     with stenting x 2 right leg below knee and 1 left thigh    REMOVAL OF PENILE PROSTHESIS N/A 11/11/2021   Procedure: REMOVAL OF PENILE PROSTHESIS AND REPLACEMENT OF PENILE PROSTHESIS [employer];  Surgeon: Despina Arias, MD;  Location: [facility];  Service: Urology;  Laterality: N/A;   SHOULDER ARTHROSCOPY Bilateral    TRIGGER FINGER RELEASE Left    UPPER ESOPHAGEAL ENDOSCOPIC ULTRASOUND (EUS) N/A 06/02/2021   Procedure: UPPER ESOPHAGEAL ENDOSCOPIC ULTRASOUND (EUS);  Surgeon: Lemar Lofty., MD;  Location: [facility];  Service: Gastroenterology;  Laterality: N/A;   VITRECTOMY Left    left eye   Current Outpatient Medications  Medication Sig Dispense Refill   acetaminophen (TYLENOL) 500 MG tablet Take 500-1,000 mg by mouth every 6 (six) hours as needed (pain.).     albuterol (VENTOLIN HFA) 108 (90 Base) MCG/ACT inhaler INHALE 1-2 PUFFS BY MOUTH EVERY 6 HOURS AS NEEDED FOR WHEEZE OR SHORTNESS OF BREATH 18 each 3   aspirin EC 81 MG tablet Take 81 mg by mouth daily. Take while plavix is held per dr end     atorvastatin (LIPITOR) 40 MG tablet Take 1 tablet (40 mg total) by mouth daily. 30 tablet 5   clopidogrel (PLAVIX) 75 MG tablet Take 1 tablet (75 mg total) by mouth daily. PLEASE CALL [phone] TO SCHEDULE 6 MONTH APPOINTMENT. THANK YOU. 90 tablet 0   cyclobenzaprine (FLEXERIL) 5 MG tablet Take 5-10 mg by mouth at bedtime as needed for muscle spasms.     cycloSPORINE (RESTASIS) 0.05 % ophthalmic emulsion Place 1 drop into both eyes 2 (two) times daily.     hyoscyamine (LEVBID) 0.375 MG 12 hr tablet Take 1 tablet (0.375 mg total) by mouth every 12  (twelve) hours as needed. 60 tablet 2   mesalamine (LIALDA) 1.2 g EC tablet Take 2 tablets (2.4 g total) by mouth 2 (two) times daily. 120 tablet 6   metoprolol succinate (TOPROL-XL) 100 MG 24 hr tablet TAKE 1 TABLET BY MOUTH EVERY DAY WITH OR IMMEDIATELY FOLLOWING A MEAL 90 tablet 1   nitroGLYCERIN (NITROSTAT) 0.4 MG SL tablet Place 1 tablet (0.4 mg total) under the tongue every 5 (five) minutes as needed for chest pain. 25 tablet 3   oxyCODONE-acetaminophen (PERCOCET) 10-325 MG tablet Take 1 tablet by mouth 4 (four) times daily as needed.     telmisartan-hydrochlorothiazide (MICARDIS HCT) 80-12.5 MG tablet Take 1 tablet by mouth daily. In am d/c 40-12.5 mg dose 90 tablet 3   Current Facility-Administered Medications  Medication Dose Route Frequency Provider Last Rate Last Admin   0.9 %  sodium chloride infusion  500 mL  Intravenous Once Mansouraty, Netty Starring., MD        Current Outpatient Medications:    acetaminophen (TYLENOL) 500 MG tablet, Take 500-1,000 mg by mouth every 6 (six) hours as needed (pain.)., Disp: , Rfl:    albuterol (VENTOLIN HFA) 108 (90 Base) MCG/ACT inhaler, INHALE 1-2 PUFFS BY MOUTH EVERY 6 HOURS AS NEEDED FOR WHEEZE OR SHORTNESS OF BREATH, Disp: 18 each, Rfl: 3   aspirin EC 81 MG tablet, Take 81 mg by mouth daily. Take while plavix is held per dr end, Disp: , Rfl:    atorvastatin (LIPITOR) 40 MG tablet, Take 1 tablet (40 mg total) by mouth daily., Disp: 30 tablet, Rfl: 5   clopidogrel (PLAVIX) 75 MG tablet, Take 1 tablet (75 mg total) by mouth daily. PLEASE CALL [phone] TO SCHEDULE 6 MONTH APPOINTMENT. THANK YOU., Disp: 90 tablet, Rfl: 0   cyclobenzaprine (FLEXERIL) 5 MG tablet, Take 5-10 mg by mouth at bedtime as needed for muscle spasms., Disp: , Rfl:    cycloSPORINE (RESTASIS) 0.05 % ophthalmic emulsion, Place 1 drop into both eyes 2 (two) times daily., Disp: , Rfl:    hyoscyamine (LEVBID) 0.375 MG 12 hr tablet, Take 1 tablet (0.375 mg total) by mouth every 12  (twelve) hours as needed., Disp: 60 tablet, Rfl: 2   mesalamine (LIALDA) 1.2 g EC tablet, Take 2 tablets (2.4 g total) by mouth 2 (two) times daily., Disp: 120 tablet, Rfl: 6   metoprolol succinate (TOPROL-XL) 100 MG 24 hr tablet, TAKE 1 TABLET BY MOUTH EVERY DAY WITH OR IMMEDIATELY FOLLOWING A MEAL, Disp: 90 tablet, Rfl: 1   nitroGLYCERIN (NITROSTAT) 0.4 MG SL tablet, Place 1 tablet (0.4 mg total) under the tongue every 5 (five) minutes as needed for chest pain., Disp: 25 tablet, Rfl: 3   oxyCODONE-acetaminophen (PERCOCET) 10-325 MG tablet, Take 1 tablet by mouth 4 (four) times daily as needed., Disp: , Rfl:    telmisartan-hydrochlorothiazide (MICARDIS HCT) 80-12.5 MG tablet, Take 1 tablet by mouth daily. In am d/c 40-12.5 mg dose, Disp: 90 tablet, Rfl: 3  Current Facility-Administered Medications:    0.9 %  sodium chloride infusion, 500 mL, Intravenous, Once, Mansouraty, Netty Starring., MD Allergies  Allergen Reactions   Cymbalta [Duloxetine Hcl] Other (See Comments)    drowsiness   Family History  Problem Relation Age of Onset   Heart disease Mother    Stroke Mother    Breast cancer Mother    Other Father        no info on him or his health   Diabetes Sister    Diabetes Brother    Colon cancer Neg Hx    Esophageal cancer Neg Hx    Inflammatory bowel disease Neg Hx    Liver disease Neg Hx    Pancreatic cancer Neg Hx    Rectal cancer Neg Hx    Stomach cancer Neg Hx    Social History   Socioeconomic History   Marital status: Married    Spouse name: Britta Mccreedy   Number of children: 2   Years of education: Not on file   Highest education level: GED or equivalent  Occupational History   Occupation: retired  Tobacco Use   Smoking status: Former    Current packs/day: 0.00    Average packs/day: 2.0 packs/day for 40.0 years (80.0 ttl pk-yrs)    Types: Cigarettes    Start date: 04/20/1958    Quit date: 04/20/1998    Years since quitting: 25.1   Smokeless tobacco: Never  Tobacco  comments:    quit in 2000s   Vaping Use   Vaping status: Never Used  Substance and Sexual Activity   Alcohol use: Yes    Comment: rare   Drug use: Not Currently   Sexual activity: Yes  Other Topics Concern   Not on file  Social History Narrative   Moved from [place] in 10/2018   2 sons in 55s as of 11/24/2018    Married wife is DPR Britta Mccreedy x 45 years as of 04/2019   Social Drivers of Health   Financial Resource Strain: Low Risk  (11/23/2022)   Overall Financial Resource Strain (CARDIA)    Difficulty of Paying Living Expenses: Not hard at all  Food Insecurity: No Food Insecurity (11/23/2022)   Hunger Vital Sign    Worried About Running Out of Food in the Last Year: Never true    Ran Out of Food in the Last Year: Never true  Transportation Needs: No Transportation Needs (11/23/2022)   PRAPARE - Administrator, Civil Service (Medical): No    Lack of Transportation (Non-Medical): No  Physical Activity: Insufficiently Active (11/23/2022)   Exercise Vital Sign    Days of Exercise per Week: 1 day    Minutes of Exercise per Session: 10 min  Stress: No Stress Concern Present (11/23/2022)   [employer] of Occupational Health - Occupational Stress Questionnaire    Feeling of Stress : Not at all  Social Connections: Moderately Integrated (11/23/2022)   Social Connection and Isolation Panel [NHANES]    Frequency of Communication with Friends and Family: Twice a week    Frequency of Social Gatherings with Friends and Family: Once a week    Attends Religious Services: 1 to 4 times per year    Active Member of [employer] or Organizations: No    Attends Banker Meetings: Never    Marital Status: Married  Recent Concern: Social Connections - Moderately Isolated (10/12/2022)   Social Connection and Isolation Panel [NHANES]    Frequency of Communication with Friends and Family: More than three times a week    Frequency of Social Gatherings with Friends and Family: More  than three times a week    Attends Religious Services: Never    Database administrator or Organizations: No    Attends Banker Meetings: Never    Marital Status: Married  Catering manager Violence: Not At Risk (10/12/2022)   Humiliation, Afraid, Rape, and Kick questionnaire    Fear of Current or Ex-Partner: No    Emotionally Abused: No    Physically Abused: No    Sexually Abused: No    Physical Exam: Today's Vitals   06/11/23 1350 06/11/23 1354  BP: 123/71   Pulse: 99   Temp: 97.7 F (36.5 C) 97.7 F (36.5 C)  SpO2: 97%   Weight: 186 lb (84.4 kg)   Height: 5\' 7"  (1.702 m)    Body mass index is 29.13 kg/m. GEN: NAD EYE: Sclerae anicteric ENT: MMM CV: Non-tachycardic GI: Soft, NT/ND NEURO:  Alert & Oriented x 3  Lab Results: No results for input(s): "WBC", "HGB", "HCT", "PLT" in the last 72 hours. BMET No results for input(s): "NA", "K", "CL", "CO2", "GLUCOSE", "BUN", "CREATININE", "CALCIUM" in the last 72 hours. LFT No results for input(s): "PROT", "ALBUMIN", "AST", "ALT", "ALKPHOS", "BILITOT", "BILIDIR", "IBILI" in the last 72 hours. PT/INR No results for input(s): "LABPROT", "INR" in the last 72 hours.   Impression / Plan: This  is a 73 y.o.male  who presents for EGD/colonoscopy for evaluation of potential portal hypertension in the setting of chronic liver disease and for history of ulcerative pancolitis for evaluation of disease activity.  The risks and benefits of endoscopic evaluation/treatment were discussed with the patient and/or family; these include but are not limited to the risk of perforation, infection, bleeding, missed lesions, lack of diagnosis, severe illness requiring hospitalization, as well as anesthesia and sedation related illnesses.  The patient's history has been reviewed, patient examined, no change in status, and deemed stable for procedure.  The patient and/or family is agreeable to proceed.    Corliss Parish, MD [place]  [facility] Advanced Endoscopy Office # [phone]

## 2023-06-11 NOTE — Patient Instructions (Signed)
Restart plavix tomorrow.  Start Nexium 40 mg daily.  YOU HAD AN ENDOSCOPIC PROCEDURE TODAY AT THE [facility]:   Refer to the procedure report that was given to you for any specific questions about what was found during the examination.  If the procedure report does not answer your questions, please call your gastroenterologist to clarify.  If you requested that your care partner not be given the details of your procedure findings, then the procedure report has been included in a sealed envelope for you to review at your convenience later.  YOU SHOULD EXPECT: Some feelings of bloating in the abdomen. Passage of more gas than usual.  Walking can help get rid of the air that was put into your GI tract during the procedure and reduce the bloating. If you had a lower endoscopy (such as a colonoscopy or flexible sigmoidoscopy) you may notice spotting of blood in your stool or on the toilet paper. If you underwent a bowel prep for your procedure, you may not have a normal bowel movement for a few days.  Please Note:  You might notice some irritation and congestion in your nose or some drainage.  This is from the oxygen used during your procedure.  There is no need for concern and it should clear up in a day or so.  SYMPTOMS TO REPORT IMMEDIATELY:  Following lower endoscopy (colonoscopy or flexible sigmoidoscopy):  Excessive amounts of blood in the stool  Significant tenderness or worsening of abdominal pains  Swelling of the abdomen that is new, acute  Fever of 100F or higher  Following upper endoscopy (EGD)  Vomiting of blood or coffee ground material  New chest pain or pain under the shoulder blades  Painful or persistently difficult swallowing  New shortness of breath  Fever of 100F or higher  Black, tarry-looking stools  For urgent or emergent issues, a gastroenterologist can be reached at any hour by calling [phone]. Do not use MyChart messaging for urgent concerns.     DIET:  We do recommend a small meal at first, but then you may proceed to your regular diet.  Drink plenty of fluids but you should avoid alcoholic beverages for 24 hours.  ACTIVITY:  You should plan to take it easy for the rest of today and you should NOT DRIVE or use heavy machinery until tomorrow (because of the sedation medicines used during the test).    FOLLOW UP: Our staff will call the number listed on your records the next business day following your procedure.  We will call around 7:15- 8:00 am to check on you and address any questions or concerns that you may have regarding the information given to you following your procedure. If we do not reach you, we will leave a message.     If any biopsies were taken you will be contacted by phone or by letter within the next 1-3 weeks.  Please call us at [phone] if you have not heard about the biopsies in 3 weeks.    SIGNATURES/CONFIDENTIALITY: You and/or your care partner have signed paperwork which will be entered into your electronic medical record.  These signatures attest to the fact that that the information above on your After Visit Summary has been reviewed and is understood.  Full responsibility of the confidentiality of this discharge information lies with you and/or your care-partner.

## 2023-06-11 NOTE — Progress Notes (Signed)
1504 BP 172/94, Labetalol given IV, MD update. Report given to PACU, vss

## 2023-06-13 ENCOUNTER — Other Ambulatory Visit: Payer: Self-pay | Admitting: Internal Medicine

## 2023-06-14 ENCOUNTER — Telehealth: Payer: Self-pay | Admitting: *Deleted

## 2023-06-14 NOTE — Telephone Encounter (Signed)
 Left message on f/u call

## 2023-06-15 ENCOUNTER — Telehealth: Payer: Self-pay

## 2023-06-15 ENCOUNTER — Other Ambulatory Visit (HOSPITAL_COMMUNITY): Payer: Self-pay

## 2023-06-15 NOTE — Telephone Encounter (Signed)
 Pharmacy Patient Advocate Encounter   Received notification from CoverMyMeds that prior authorization for  Esomeprazole Magnesium 40MG  dr capsules is required/requested.   Insurance verification completed.   The patient is insured through [facility] .   Per test claim: PA required; PA submitted to above mentioned insurance via CoverMyMeds Key/confirmation #[id number] Status is pending  PER CMM:

## 2023-06-16 LAB — SURGICAL PATHOLOGY

## 2023-06-22 ENCOUNTER — Encounter: Payer: Self-pay | Admitting: Gastroenterology

## 2023-07-02 ENCOUNTER — Other Ambulatory Visit: Payer: Self-pay | Admitting: Internal Medicine

## 2023-07-06 ENCOUNTER — Telehealth: Payer: Self-pay

## 2023-07-06 ENCOUNTER — Other Ambulatory Visit (HOSPITAL_COMMUNITY): Payer: Self-pay

## 2023-07-06 NOTE — Telephone Encounter (Signed)
 PA request has been Submitted through fax. New Encounter has been or will be created for follow up. For additional info see Pharmacy Prior Auth telephone encounter from 07-06-2023.

## 2023-07-06 NOTE — Telephone Encounter (Signed)
 Pharmacy Patient Advocate Encounter   Received notification from RX Request Messages that prior authorization for Esomeprazole Magnesium 40MG  dr capsules is required/requested.   Insurance verification completed.   The patient is insured through [facility] .   Per test claim: PA required; PA submitted to above mentioned insurance via Fax Key/confirmation #/[id number] Status is pending

## 2023-07-08 NOTE — Telephone Encounter (Signed)
 See note from CPhT

## 2023-07-20 IMAGING — CT CT ABD-PELV W/ CM
3 of 6 series · 15 of 46 positions shown, 17 images · IV contrast (agent unspecified)
Comparison: None.

CLINICAL DATA: Generalized abdominal pain and bloating for several
months. History of ulcerative colitis.

EXAM:
CT ABDOMEN AND PELVIS WITH CONTRAST
TECHNIQUE: Multidetector CT imaging of the abdomen and pelvis was performed
using the standard protocol following bolus administration of
intravenous contrast.

[Series 2: abd pelvis 5.00 · axial · 0.91mm/px · z∈[-1514,-1109]mm · 10 of 100 slices shown, 12 images]
[im 10/100  soft-tissue]
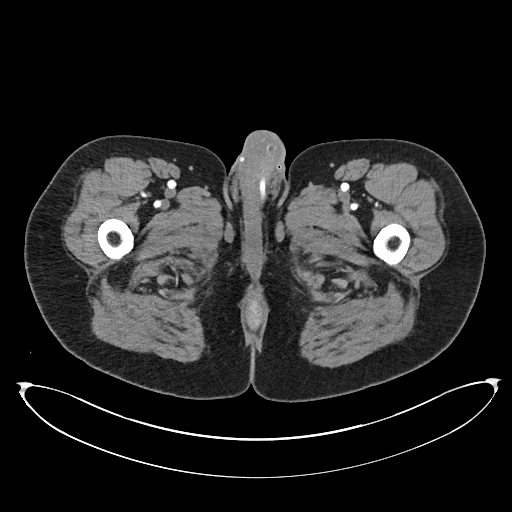
[im 10/100  bone]
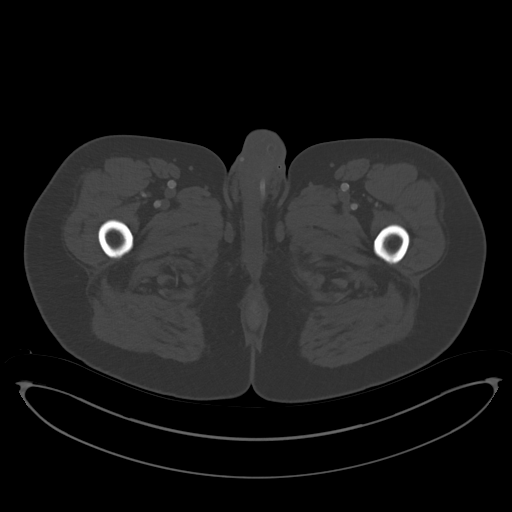
[im 19/100  soft-tissue]
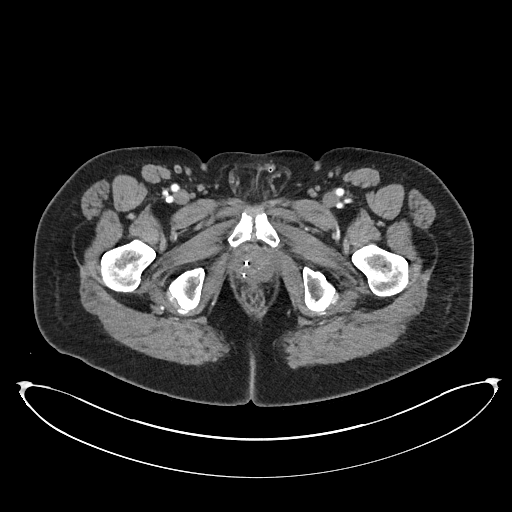
[im 28/100  soft-tissue]
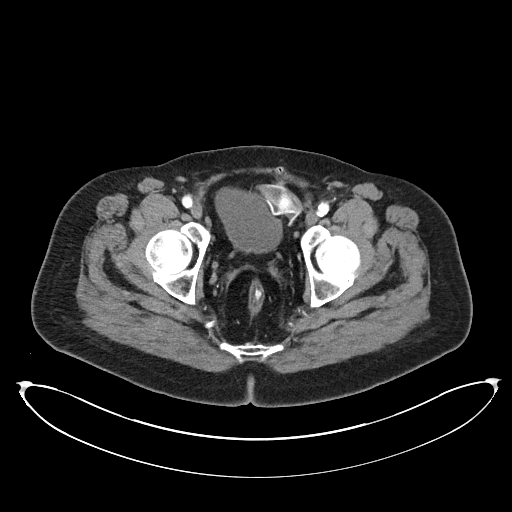
[im 37/100  soft-tissue]
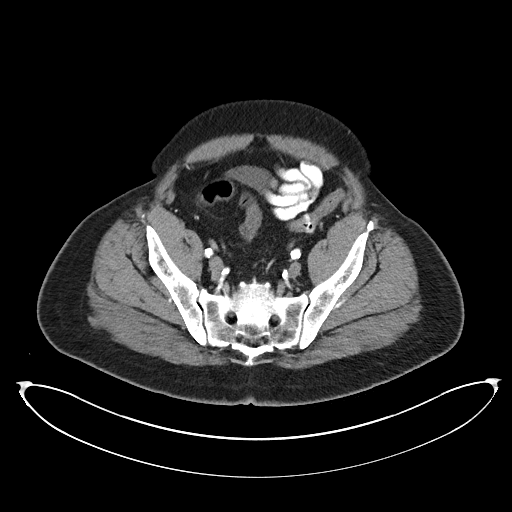
[im 46/100  soft-tissue]
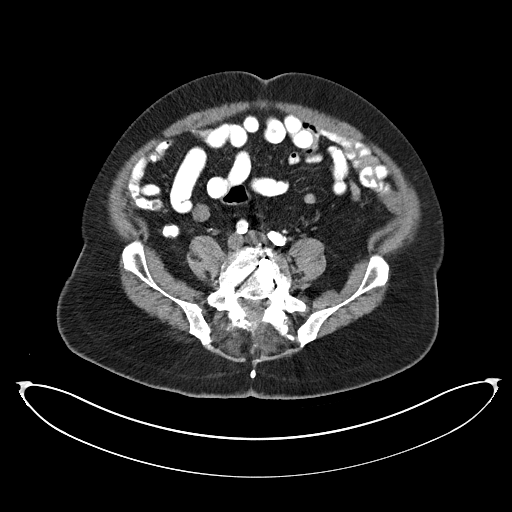
[im 55/100  soft-tissue]
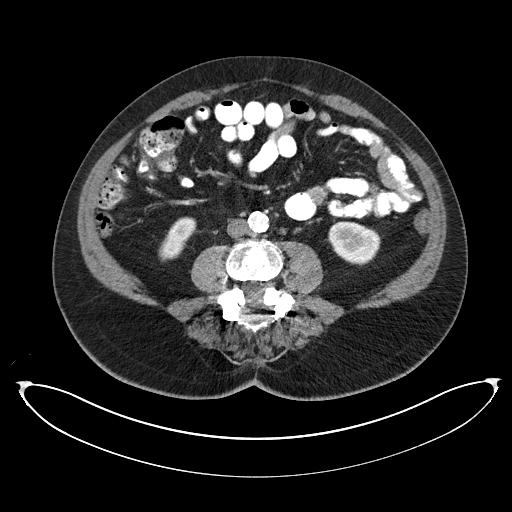
[im 64/100  soft-tissue]
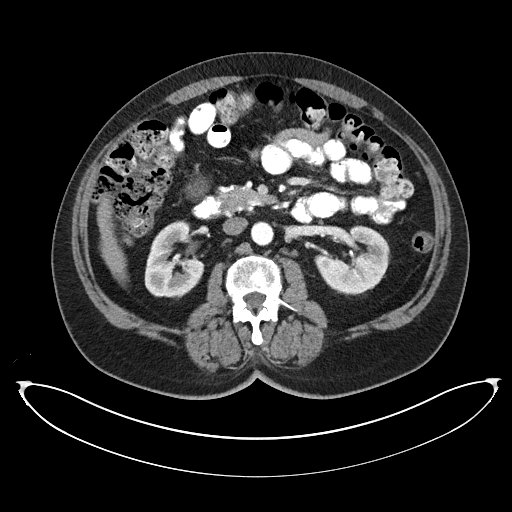
[im 73/100  soft-tissue]
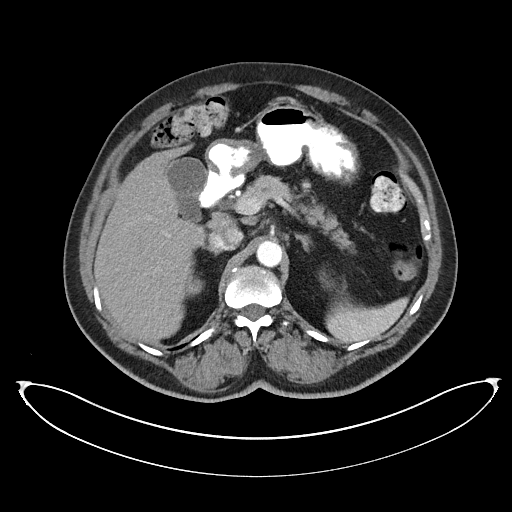
[im 82/100  soft-tissue]
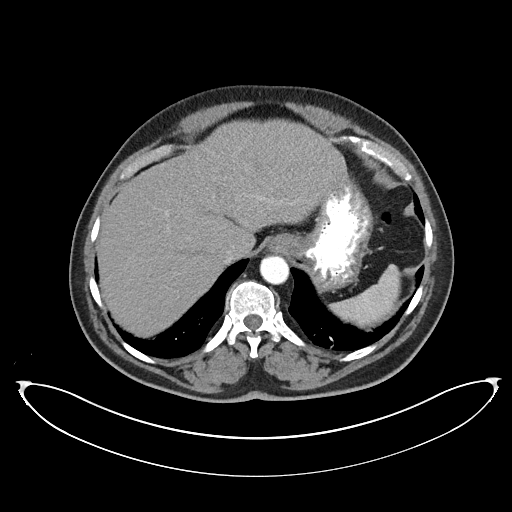
[im 82/100  bone]
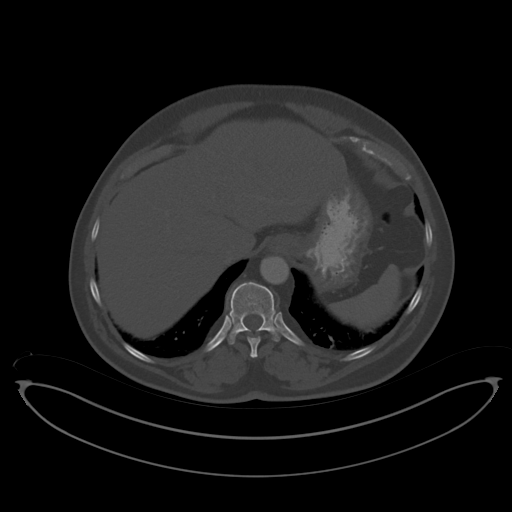
[im 91/100  soft-tissue]
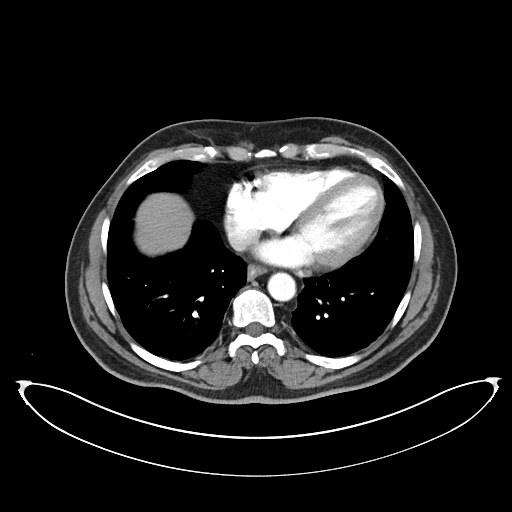

[Series 4: lungs abd pelvis 5.00 · axial · 0.91mm/px · z∈[-1164,-1114]mm · 2 of 31 slices shown]
[im 11/31  soft-tissue]
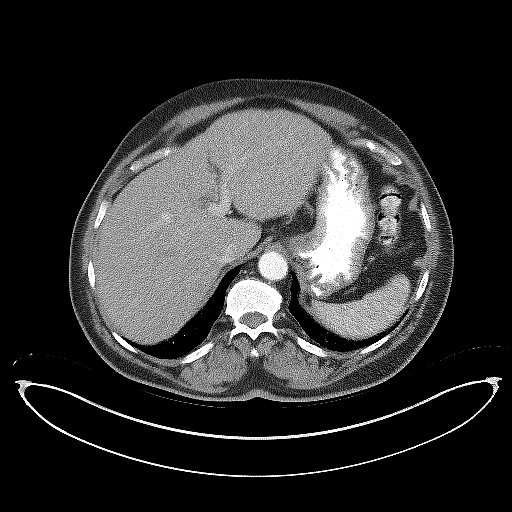
[im 21/31  soft-tissue]
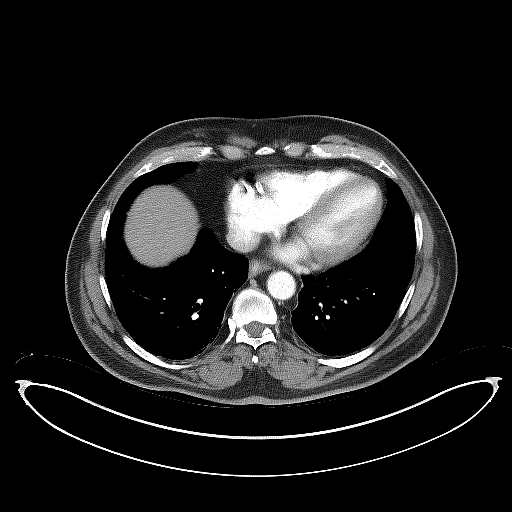

[Series 5: coronals abd pelvis 2.00 cor · coronal · 0.84mm/px · 3 of 165 slices shown]
[im 55/165  soft-tissue]
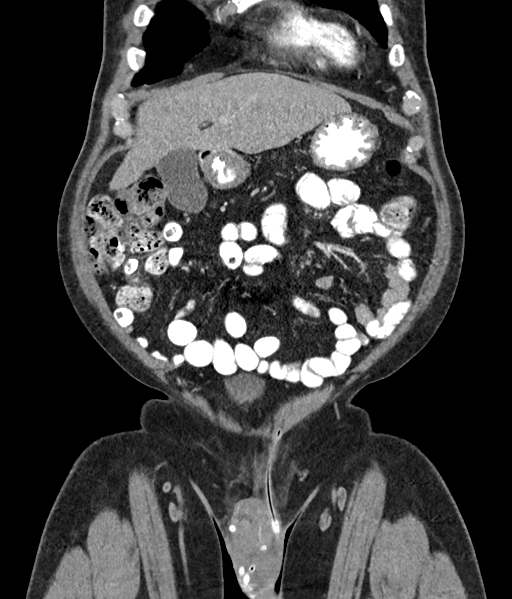
[im 73/165  soft-tissue]
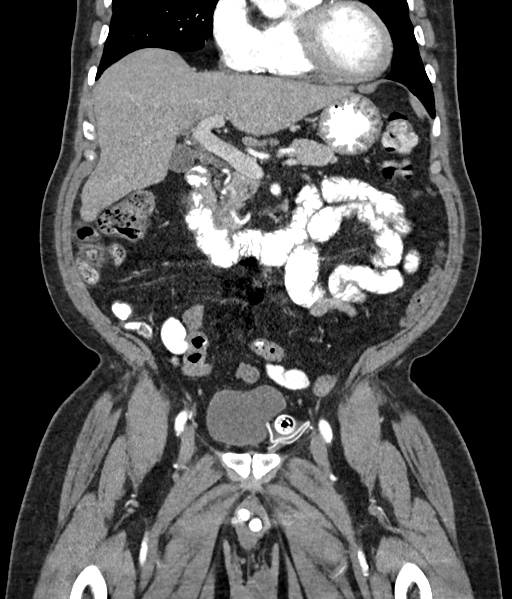
[im 92/165  soft-tissue]
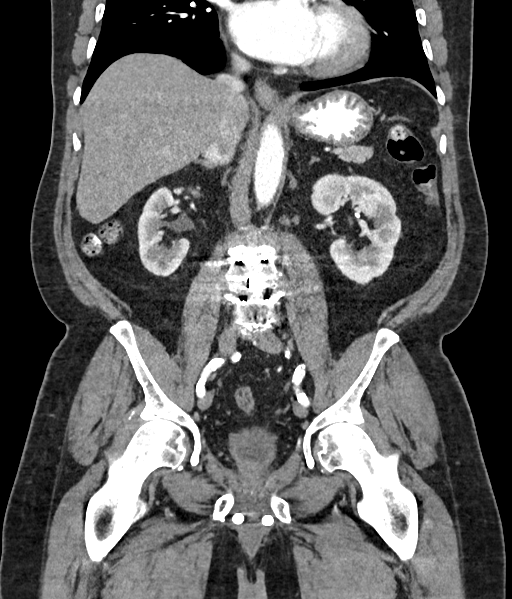

[15 of 46 positions shown; findings below may reference images not displayed]

RADIATION DOSE REDUCTION: This exam was performed according to the
departmental dose-optimization program which includes automated
exposure control, adjustment of the mA and/or kV according to
patient size and/or use of iterative reconstruction technique.

CONTRAST:  100mL OMNIPAQUE IOHEXOL 300 MG/ML  SOLN
FINDINGS: Lower chest: The lung bases are clear of acute process. No pleural
effusion or pulmonary lesions. The heart is normal in size. No
pericardial effusion. Coronary artery calcifications noted. The
distal esophagus and aorta are unremarkable.

Hepatobiliary: Irregular liver contour, prominent hepatic fissures
and slight caudate lobe enlargement suggesting changes of cirrhosis.
No hepatic lesions or intrahepatic biliary dilatation. The
gallbladder is unremarkable. No common bile duct dilatation.

Pancreas: No mass, inflammation or ductal dilatation.

Spleen: Normal size. No focal lesions.

Adrenals/Urinary Tract: Adrenal glands are normal. No renal lesions
or hydronephrosis. No collecting system abnormalities. The bladder
is unremarkable.

Stomach/Bowel: The stomach, duodenum, small bowel and colon are
unremarkable. No acute inflammatory changes, mass lesions or
obstructive findings. Fibrofatty infiltrative type changes are noted
in the sigmoid colon suggesting prior inflammatory bowel disease.

Vascular/Lymphatic: Advanced atherosclerotic calcifications
involving the aorta and branch vessels but no aneurysm or
dissection. No mesenteric or retroperitoneal adenopathy.

Reproductive: Brachytherapy seeds are noted in the prostate gland.
The seminal vesicles are unremarkable. A penile prosthesis is noted
with a collapsed reservoir.

Other: No pelvic mass or adenopathy. No free pelvic fluid
collections. No inguinal mass or adenopathy. No abdominal wall
hernia or subcutaneous lesions.

Musculoskeletal: Stable surgical changes involving the lumbar spine.
No acute bony findings destructive bony changes. No evidence of
sclerotic bone metastasis.
IMPRESSION: 1. No acute abdominal/pelvic findings, mass lesions or
lymphadenopathy.
2. No findings suspicious for active ulcerative colitis.
3. Suspect cirrhotic changes involving the liver.
4. Advanced atherosclerotic calcifications involving the aorta and
branch vessels.
5. Brachytherapy seeds in the prostate gland but no findings to
suggest metastatic prostate cancer.

Aortic Atherosclerosis ([id number].M).

## 2023-07-26 ENCOUNTER — Telehealth: Payer: Self-pay | Admitting: Neurology

## 2023-07-26 NOTE — Telephone Encounter (Signed)
 Pt rescheduled appointment due to scheduling conflict

## 2023-08-02 ENCOUNTER — Ambulatory Visit: Payer: Medicare Other | Admitting: Adult Health

## 2023-08-11 ENCOUNTER — Ambulatory Visit: Admitting: Adult Health

## 2023-08-11 ENCOUNTER — Encounter: Payer: Self-pay | Admitting: Adult Health

## 2023-08-11 ENCOUNTER — Other Ambulatory Visit (HOSPITAL_COMMUNITY): Payer: Self-pay

## 2023-08-11 NOTE — Telephone Encounter (Signed)
 Pharmacy Patient Advocate Encounter  Received notification from [facility] that Prior Authorization for  Esomeprazole  Magnesium  40MG  dr capsules has been APPROVED. No letter received for dates, will call.  PA #/Case ID/Reference #: [id number]   Co-pay for #30 per 30 days is $3.49

## 2023-08-24 ENCOUNTER — Telehealth: Payer: Self-pay

## 2023-08-24 NOTE — Telephone Encounter (Signed)
 Copied from CRM [id number]. Topic: Clinical - Medical Advice >> Aug 24, 2023  2:33 PM Luane Rumps D wrote: Reason for CRM: Patient is wondering what type of specialty he needs to see for his dupuytren's contracture, he is wondering if he should speak with his NP regarding that or if he needs a referral.

## 2023-08-25 ENCOUNTER — Other Ambulatory Visit: Payer: Self-pay

## 2023-08-25 DIAGNOSIS — M720 Palmar fascial fibromatosis [Dupuytren]: Secondary | ICD-10-CM

## 2023-08-25 NOTE — Telephone Encounter (Signed)
 Patient states he is not currently seeing orthopaedics so he would like a referral to ortho and he does not have a preference. I will place the referral. Patient states his hand is almost completely frozen.

## 2023-08-30 NOTE — Telephone Encounter (Signed)
 Called and gave pt information to [facility]

## 2023-08-31 ENCOUNTER — Telehealth: Payer: Self-pay | Admitting: Adult Health

## 2023-08-31 NOTE — Telephone Encounter (Signed)
 Pt called to Scheduled Appt .  Appt Scheduled

## 2023-09-02 ENCOUNTER — Other Ambulatory Visit: Payer: Self-pay | Admitting: Family Medicine

## 2023-09-02 DIAGNOSIS — I10 Essential (primary) hypertension: Secondary | ICD-10-CM

## 2023-09-03 ENCOUNTER — Other Ambulatory Visit: Payer: Self-pay | Admitting: Family Medicine

## 2023-09-03 DIAGNOSIS — U071 COVID-19: Secondary | ICD-10-CM

## 2023-09-03 DIAGNOSIS — J9801 Acute bronchospasm: Secondary | ICD-10-CM

## 2023-09-23 ENCOUNTER — Emergency Department
Admission: EM | Admit: 2023-09-23 | Discharge: 2023-09-23 | Disposition: A | Discharge status: Home / Self Care | Attending: Emergency Medicine | Admitting: Emergency Medicine

## 2023-09-23 ENCOUNTER — Other Ambulatory Visit: Payer: Self-pay

## 2023-09-23 ENCOUNTER — Emergency Department

## 2023-09-23 DIAGNOSIS — M545 Low back pain, unspecified: Secondary | ICD-10-CM | POA: Diagnosis present

## 2023-09-23 MED ORDER — METHOCARBAMOL 500 MG PO TABS: 1000.0000 mg | ORAL_TABLET | Freq: Three times a day (TID) | ORAL | 0 refills | Status: AC

## 2023-09-23 MED ORDER — HYDROMORPHONE HCL 1 MG/ML IJ SOLN
1.0000 mg | Freq: Once | INTRAMUSCULAR | Status: AC
  Administered 2023-09-23: 1 mg via INTRAMUSCULAR
  Filled 2023-09-23: qty 1

## 2023-09-23 MED ORDER — LIDOCAINE 5 % EX PTCH
1.0000 | MEDICATED_PATCH | CUTANEOUS | 0 refills | Status: AC
Start: 2023-09-23 — End: 2023-10-03

## 2023-09-23 NOTE — ED Triage Notes (Signed)
 Pt here after a fall yesterday. Pt tripped on his dog's toy and he hit the dresser in the center of his back. Pt denies LOC and did not hit his head. 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 Pt woke up this morning and was not able to move like he normally can.

## 2023-09-23 NOTE — Discharge Instructions (Addendum)
 I believe the pain that you are feeling is due to to exacerbation of your chronic pain, a muscle strain or a muscle contusion. You can take 650 mg of Tylenol  and 600 mg of ibuprofen every 6 hours as needed for pain. You can use ice, heat, muscle creams and other topical pain relievers as well.  I did send some lidocaine  patches to the pharmacy.  This can be used every 12 hours.  I have sent a muscle relaxer to your pharmacy. This can be taken every 8 hours as needed for muscle spasms. This medication is sedating, so do not drive for 8 hours after taking it.   You can also take the previously prescribed oxycodone .  Please take this only as needed for severe pain.  Follow up with your orthopedic doctor as needed. Return to the ED with any worsening symptoms.  Results from the CT scan: 1. No evidence of acute lumbar spine fracture, traumatic subluxation  or static signs of instability.  2. Stable postsurgical changes from previous posterior lumbar and  interbody fusion from L3 through L5. No evidence of hardware failure  or complication.  3. Progressive adjacent segment spondylosis at L2-3 with similar  resulting mild multifactorial spinal stenosis and moderate foraminal  narrowing bilaterally.  4.  Aortic Atherosclerosis (ICD10-I70.0).

## 2023-09-23 NOTE — ED Provider Notes (Signed)
 [facility] Provider Note    Event Date/Time   First MD Initiated Contact with Patient 09/23/23 1302     (approximate)   History   Fall   HPI  Erik Nicholson is a 73 y.o. male with PMH of chronic back pain among other things listed in chart who presents for evaluation of right lower back pain after a fall yesterday. Patient has neuropathy in the right leg and cannot feel anything at his foot. He was getting out of bed yesterday and tripped on a dog toy causing him to fall backwards and hit his back on the dresser. He did not hit his head, no LOC. He report sharp pain to the right lower back. He has tried taking medication at home without relief. Denies changes in bladder and bowel function, saddle anesthesia and worsening numbness/weakness.       Physical Exam   Triage Vital Signs: ED Triage Vitals  Encounter Vitals Group     BP 09/23/23 1213 (!) 145/115     Systolic BP Percentile --      Diastolic BP Percentile --      Pulse Rate 09/23/23 1213 74     Resp 09/23/23 1213 17     Temp 09/23/23 1213 98.9 F (37.2 C)     Temp Source 09/23/23 1213 Oral     SpO2 09/23/23 1213 99 %     Weight 09/23/23 1214 186 lb 1.1 oz (84.4 kg)     Height 09/23/23 1214 5\' 7"  (1.702 m)     Head Circumference --      Peak Flow --      Pain Score 09/23/23 1214 10     Pain Loc --      Pain Education --      Exclude from Growth Chart --     Most recent vital signs: Vitals:   09/23/23 1213 09/23/23 1304  BP: (!) 145/115   Pulse: 74   Resp: 17   Temp: 98.9 F (37.2 C)   SpO2: 99% 99%    General: Awake, uncomfortable on exam. CV:  Good peripheral perfusion. RRR. Resp:  Normal effort. CTAB. Abd:  No distention.  Other:  No TTP over the lumbar spine but is sore in the right paraspinal muscles, unsteady on his feet but able to take a few steps   ED Results / Procedures / Treatments   Labs (all labs ordered are listed, but only abnormal results are  displayed) Labs Reviewed - No data to display  RADIOLOGY  CT lumbar spine obtained from triage, I interpreted the iamges as well as reviewed the radiologist report which was negative for acute abnormalities but does show chronic changes listed below.   1. No evidence of acute lumbar spine fracture, traumatic subluxation  or static signs of instability.  2. Stable postsurgical changes from previous posterior lumbar and  interbody fusion from L3 through L5. No evidence of hardware failure  or complication.  3. Progressive adjacent segment spondylosis at L2-3 with similar  resulting mild multifactorial spinal stenosis and moderate foraminal  narrowing bilaterally.  4.  Aortic Atherosclerosis (ICD10-I70.0).    PROCEDURES:  Critical Care performed: No  Procedures   MEDICATIONS ORDERED IN ED: Medications  HYDROmorphone (DILAUDID) injection 1 mg (1 mg Intramuscular Given 09/23/23 1407)     IMPRESSION / MDM / ASSESSMENT AND PLAN / ED COURSE  I reviewed the triage vital signs and the nursing notes.  73 year old male presents for evaluation of back pain. BP elevated but patient is quite uncomfortable on exam. VSS otherwise.   Differential diagnosis includes, but is not limited to, muscle strain, contusion, lumbar radiculopathy, less likely lumbar fracture, acute cord compression.  Patient's presentation is most consistent with acute complicated illness / injury requiring diagnostic workup.  No red flag signs of back pain so do not believe patient has an acute spinal cord compression.   Suspect muscle strain or contusion given that patient does not have point tenderness over the spine. CT lumbar spine was negative for acute abnormalities.   Patient was very uncomfortable and had already taken 10 mg of oxycodone  at home so he was given dilaudid for pain which he reported improved his symptoms.   Discussed pain management at home using the oxycodone ,  tylenol , nsaids, lidocaine  patches, ice and heat. I will also send a prescription for a muscle relaxer. Patient has an orthopedic doc he follows with. We discussed return precautions. He voiced understanding, all questions were answered and he was stable at discharge.      FINAL CLINICAL IMPRESSION(S) / ED DIAGNOSES   Final diagnoses:  Acute on chronic low back pain     Rx / DC Orders   ED Discharge Orders          Ordered    methocarbamol (ROBAXIN) 500 MG tablet  3 times daily        09/23/23 1539    lidocaine  (LIDODERM ) 5 %  Every 24 hours        09/23/23 1539             Note:  This document was prepared using Dragon voice recognition software and may include unintentional dictation errors.   Phyliss Breen, PA-C 09/23/23 1558    Charleen Conn, MD 09/23/23 [phone]

## 2023-09-23 NOTE — ED Notes (Signed)
 The pt has been offered assistance to the bed multiple times but the pt has refused.The pt advised he wanted to stay in the wheel chair for comfort. The pt was instructed to call if he wanted to be assisted onto the bed.

## 2023-10-04 ENCOUNTER — Other Ambulatory Visit: Payer: Self-pay

## 2023-10-04 DIAGNOSIS — I10 Essential (primary) hypertension: Secondary | ICD-10-CM

## 2023-10-05 ENCOUNTER — Other Ambulatory Visit: Payer: Self-pay | Admitting: Internal Medicine

## 2023-10-05 ENCOUNTER — Other Ambulatory Visit: Payer: Self-pay

## 2023-10-05 DIAGNOSIS — I10 Essential (primary) hypertension: Secondary | ICD-10-CM

## 2023-10-05 NOTE — Telephone Encounter (Signed)
 Copied from CRM [id number]. Topic: Clinical - Prescription Issue >> Oct 05, 2023  1:25 PM Jenice Mitts wrote: Reason for CRM: Patient calling in because he needs his medication and the pharmacy told him it was refused.  Patient stated he normally gets the 90 days because it is cheaper than a 1 month supply  He's wondering what he needs to get the medication

## 2023-10-06 ENCOUNTER — Telehealth: Payer: Self-pay

## 2023-10-06 MED ORDER — TELMISARTAN-HCTZ 80-12.5 MG PO TABS: 1.0000 | ORAL_TABLET | Freq: Every day | ORAL | 0 refills | Status: DC

## 2023-10-06 NOTE — Telephone Encounter (Signed)
 Left message to return call to our office.  Called to let pt know that his RX has been sent to the pharmacy for 90 day supply

## 2023-10-07 ENCOUNTER — Other Ambulatory Visit: Payer: Self-pay | Admitting: Family Medicine

## 2023-10-07 DIAGNOSIS — I10 Essential (primary) hypertension: Secondary | ICD-10-CM

## 2023-10-13 ENCOUNTER — Ambulatory Visit: Payer: Federal, State, Local not specified - PPO | Admitting: *Deleted

## 2023-10-13 VITALS — BP 135/73 | Ht 67.0 in | Wt 180.0 lb

## 2023-10-13 DIAGNOSIS — Z0000 Encounter for general adult medical examination without abnormal findings: Secondary | ICD-10-CM

## 2023-10-13 NOTE — Patient Instructions (Addendum)
 [name] , Thank you for taking time out of your busy schedule to complete your Annual Wellness Visit with me. I enjoyed our conversation and look forward to speaking with you again next year. I, as well as your care team,  appreciate your ongoing commitment to your health goals. Please review the following plan we discussed and let me know if I can assist you in the future. Your Game plan/ To Do List     Referrals: If you haven't heard from the office you've been referred to, please reach out to them at the phone provided.   Remember to call and schedule an eye appointment.  You can update your covid, tetanus (Tdap) and shingles vaccines at your pharmacy.  You can update your pneumonia vaccine at your next office visit.      Follow up Visits: Next Medicare AWV with our clinical staff: 10/16/24 @ 3:00   Have you seen your provider in the last 6 months (3 months if uncontrolled diabetes)? No Next Office Visit with your provider: 11/16/23  Clinician Recommendations:  Aim for 30 minutes of exercise or brisk walking, 6-8 glasses of water , and 5 servings of fruits and vegetables each day.       This is a list of the screening recommended for you and due dates:  Health Maintenance  Topic Date Due   DTaP/Tdap/Td vaccine (1 - Tdap) Never done   Pneumococcal Vaccine for age over 39 (1 of 2 - PCV) Never done   Zoster (Shingles) Vaccine (1 of 2) Never done   Hepatitis B Vaccine (2 of 2 - CpG risk 2-dose series) 10/20/2021   COVID-19 Vaccine (7 - 2024-25 season) 12/20/2022   Flu Shot  11/19/2023   Medicare Annual Wellness Visit  10/12/2024   Colon Cancer Screening  06/10/2025   Hepatitis C Screening  Completed   HPV Vaccine  Aged Out   Meningitis B Vaccine  Aged Out    Advanced directives: (Copy Requested) Please bring a copy of your health care power of attorney and living will to the office to be added to your chart at your convenience. You can mail to [facility] [address] [place], [place] [address] or email to [email or web address] Advance Care Planning is important because it:  [x]  Makes sure you receive the medical care that is consistent with your values, goals, and preferences  [x]  It provides guidance to your family and loved ones and reduces their decisional burden about whether or not they are making the right decisions based on your wishes. Managing Pain Without Opioids Opioids are strong medicines used to treat moderate to severe pain. For some people, especially those who have long-term (chronic) pain, opioids may not be the best choice for pain management due to: Side effects like nausea, constipation, and sleepiness. The risk of addiction (opioid use disorder). The longer you take opioids, the greater your risk of addiction. Pain that lasts for more than 3 months is called chronic pain. Managing chronic pain usually requires more than one approach and is often provided by a team of health care providers working together (multidisciplinary approach). Pain management may be done at a pain management center or pain clinic. How to manage pain without the use of opioids Use non-opioid medicines Non-opioid medicines for pain may include: Over-the-counter or prescription non-steroidal anti-inflammatory drugs (NSAIDs). These may be the first medicines used for pain. They work well for muscle and bone pain, and they reduce swelling. Acetaminophen .  This over-the-counter medicine may work well for milder pain but not swelling. Antidepressants. These may be used to treat chronic pain. A certain type of antidepressant (tricyclics) is often used. These medicines are given in lower doses for pain than when used for depression. Anticonvulsants. These are usually used to treat seizures but may also reduce nerve (neuropathic) pain. Muscle relaxants. These relieve pain caused by sudden muscle tightening (spasms). You may also use a pain medicine that is  applied to the skin as a patch, cream, or gel (topical analgesic), such as a numbing medicine. These may cause fewer side effects than medicines taken by mouth. Do certain therapies as directed Some therapies can help with pain management. They include: Physical therapy. You will do exercises to gain strength and flexibility. A physical therapist may teach you exercises to move and stretch parts of your body that are weak, stiff, or painful. You can learn these exercises at physical therapy visits and practice them at home. Physical therapy may also involve: Massage. Heat wraps or applying heat or cold to affected areas. Electrical signals that interrupt pain signals (transcutaneous electrical nerve stimulation, TENS). Weak lasers that reduce pain and swelling (low-level laser therapy). Signals from your body that help you learn to regulate pain (biofeedback). Occupational therapy. This helps you to learn ways to function at home and work with less pain. Recreational therapy. This involves trying new activities or hobbies, such as a physical activity or drawing. Mental health therapy, including: Cognitive behavioral therapy (CBT). This helps you learn coping skills for dealing with pain. Acceptance and commitment therapy (ACT) to change the way you think and react to pain. Relaxation therapies, including muscle relaxation exercises and mindfulness-based stress reduction. Pain management counseling. This may be individual, family, or group counseling.  Receive medical treatments Medical treatments for pain management include: Nerve block injections. These may include a pain blocker and anti-inflammatory medicines. You may have injections: Near the spine to relieve chronic back or neck pain. Into joints to relieve back or joint pain. Into nerve areas that supply a painful area to relieve body pain. Into muscles (trigger point injections) to relieve some painful muscle conditions. A medical  device placed near your spine to help block pain signals and relieve nerve pain or chronic back pain (spinal cord stimulation device). Acupuncture. Follow these instructions at home Medicines Take over-the-counter and prescription medicines only as told by your health care provider. If you are taking pain medicine, ask your health care providers about possible side effects to watch out for. Do not drive or use heavy machinery while taking prescription opioid pain medicine. Lifestyle  Do not use drugs or alcohol to reduce pain. If you drink alcohol, limit how much you have to: 0-1 drink a day for women who are not pregnant. 0-2 drinks a day for men. Know how much alcohol is in a drink. In the [place] one drink equals one 12 oz bottle of beer (355 mL), one 5 oz glass of wine (148 mL), or one 1 oz glass of hard liquor (44 mL). Do not use any products that contain nicotine or tobacco. These products include cigarettes, chewing tobacco, and vaping devices, such as e-cigarettes. If you need help quitting, ask your health care provider. Eat a healthy diet and maintain a healthy weight. Poor diet and excess weight may make pain worse. Eat foods that are high in fiber. These include fresh fruits and vegetables, whole grains, and beans. Limit foods that are high in fat and  processed sugars, such as fried and sweet foods. Exercise regularly. Exercise lowers stress and may help relieve pain. Ask your health care provider what activities and exercises are safe for you. If your health care provider approves, join an exercise class that combines movement and stress reduction. Examples include yoga and tai chi. Get enough sleep. Lack of sleep may make pain worse. Lower stress as much as possible. Practice stress reduction techniques as told by your therapist. General instructions Work with all your pain management providers to find the treatments that work best for you. You are an important member of your pain  management team. There are many things you can do to reduce pain on your own. Consider joining an online or in-person support group for people who have chronic pain. Keep all follow-up visits. This is important. Where to find more information You can find more information about managing pain without opioids from: American Academy of Pain Medicine: painmed.org Institute for Chronic Pain: instituteforchronicpain.org American Chronic Pain Association: theacpa.org Contact a health care provider if: You have side effects from pain medicine. Your pain gets worse or does not get better with treatments or home therapy. You are struggling with anxiety or depression. Summary Many types of pain can be managed without opioids. Chronic pain may respond better to pain management without opioids. Pain is best managed when you and a team of health care providers work together. Pain management without opioids may include non-opioid medicines, medical treatments, physical therapy, mental health therapy, and lifestyle changes. Tell your health care providers if your pain gets worse or is not being managed well enough. This information is not intended to replace advice given to you by your health care provider. Make sure you discuss any questions you have with your health care provider. Document Revised: 07/17/2020 Document Reviewed: 07/17/2020   Elsevier Patient Education  2024 [employer].

## 2023-10-13 NOTE — Progress Notes (Signed)
 Subjective:   Erik Nicholson is a 73 y.o. who presents for a Medicare Wellness preventive visit.  As a reminder, Annual Wellness Visits don't include a physical exam, and some assessments may be limited, especially if this visit is performed virtually. We may recommend an in-person follow-up visit with your provider if needed.  Visit Complete: Virtual I connected with  Erik Nicholson on 10/13/23 by a audio enabled telemedicine application and verified that I am speaking with the correct person using two identifiers.  Patient Location: Home  Provider Location: Home Office  I discussed the limitations of evaluation and management by telemedicine. The patient expressed understanding and agreed to proceed.  Vital Signs: Because this visit was a virtual/telehealth visit, some criteria may be missing or patient reported. Any vitals not documented were not able to be obtained and vitals that have been documented are patient reported.  VideoDeclined- This patient declined Librarian, academic. Therefore the visit was completed with audio only.  Persons Participating in Visit: Patient.  AWV Questionnaire: Yes: Patient Medicare AWV questionnaire was completed by the patient on 10/10/23; I have confirmed that all information answered by patient is correct and no changes since this date.  Cardiac Risk Factors include: advanced age (>51men, >33 women);male gender;hypertension;dyslipidemia;Other (see comment)     Objective:    Today's Vitals   10/13/23 1137 10/13/23 1138  BP: 135/73   Weight: 180 lb (81.6 kg)   Height: 5' 7 (1.702 m)   PainSc:  8    Body mass index is 28.19 kg/m.     10/13/2023   11:54 AM 09/23/2023   12:15 PM 10/12/2022    1:25 PM 11/11/2021   11:47 AM 07/10/2021    8:44 AM 06/18/2021    6:19 AM 06/02/2021    9:49 AM  Advanced Directives  Does Patient Have a Medical Advance Directive? Yes No Yes Yes Yes Yes Yes  Type of Special educational needs teacher of [place];Living will  Living will Living will Healthcare Power of [place];Living will Living will;Healthcare Power of [employer] Power of [place];Living will  Does patient want to make changes to medical advance directive?   Yes (MAU/Ambulatory/Procedural Areas - Information given) No - Patient declined No - Patient declined No - Patient declined   Copy of Healthcare Power of Attorney in Chart? No - copy requested    No - copy requested No - copy requested Yes - validated most recent copy scanned in chart (See row information)  Would patient like information on creating a medical advance directive? No - Patient declined No - Patient declined No - Guardian declined   No - Patient declined     Current Medications (verified) Outpatient Encounter Medications as of 10/13/2023  Medication Sig   acetaminophen  (TYLENOL ) 500 MG tablet Take 500-1,000 mg by mouth every 6 (six) hours as needed (pain.).   albuterol  (VENTOLIN  HFA) 108 (90 Base) MCG/ACT inhaler INHALE 1-2 PUFFS BY MOUTH EVERY 6 HOURS AS NEEDED FOR WHEEZE OR SHORTNESS OF BREATH   atorvastatin  (LIPITOR) 40 MG tablet Take 1 tablet (40 mg total) by mouth daily.   clopidogrel  (PLAVIX ) 75 MG tablet TAKE 1 TABLET BY MOUTH EVERY DAY   cyclobenzaprine  (FLEXERIL ) 5 MG tablet Take 5-10 mg by mouth at bedtime as needed for muscle spasms.   esomeprazole  (NEXIUM ) 40 MG capsule Take 1 capsule (40 mg total) by mouth daily at 12 noon.   hyoscyamine  (LEVBID ) 0.375 MG 12 hr tablet Take 1 tablet (0.375  mg total) by mouth every 12 (twelve) hours as needed.   mesalamine  (LIALDA ) 1.2 g EC tablet Take 2 tablets (2.4 g total) by mouth 2 (two) times daily.   metoprolol  succinate (TOPROL -XL) 100 MG 24 hr tablet TAKE 1 TABLET BY MOUTH EVERY DAY WITH OR IMMEDIATELY FOLLOWING A MEAL   nitroGLYCERIN  (NITROSTAT ) 0.4 MG SL tablet Place 1 tablet (0.4 mg total) under the tongue every 5 (five) minutes as needed for chest pain.    oxyCODONE -acetaminophen  (PERCOCET) 10-325 MG tablet Take 1 tablet by mouth 4 (four) times daily as needed.   telmisartan -hydrochlorothiazide (MICARDIS  HCT) 80-12.5 MG tablet Take 1 tablet by mouth daily. In am d/c 40-12.5 mg dose   aspirin  EC 81 MG tablet Take 81 mg by mouth daily. Take while plavix  is held per dr end (Patient not taking: Reported on 10/13/2023)   cycloSPORINE (RESTASIS) 0.05 % ophthalmic emulsion Place 1 drop into both eyes 2 (two) times daily. (Patient not taking: Reported on 10/13/2023)   No facility-administered encounter medications on file as of 10/13/2023.    Allergies (verified) Cymbalta  [duloxetine  hcl]   History: Past Medical History:  Diagnosis Date   Abdominal gas pain 12/23/2018   Abnormal gait 04/30/2021   Abnormal liver diagnostic imaging 09/16/2021   Abnormal MRI, lumbar spine 05/09/2021   Abnormal MRI, thoracic spine 05/09/2021   Abnormal weight loss 11/24/2018   Accelerating angina (HCC) 03/15/2020   Antiplatelet or antithrombotic long-term use 12/23/2018   Arthritis    both hands   Arthritis of right hip 05/05/2021   Benign prostatic hyperplasia with post-void dribbling 11/24/2018   BPH (benign prostatic hyperplasia)    CAD (coronary artery disease)    x 7 cardiac stents   Chronic back pain    L4/5 with chronic right leg pain    Chronic pain syndrome 11/24/2018   Chronic radicular lumbar pain (Right) 01/17/2019   Claudication in peripheral vascular disease (HCC) 01/07/2019   COVID-19    10/20/20   Dyspnea on exertion 08/17/2020   Floater, vitreous, left    Gastric nodule 05/31/2020   History of chronic ulcerative colitis 05/31/2020   History of lumbar surgery (x2) 01/17/2019   History of prostate cancer 02/10/2021   Hx of hepatitis C    treated with Harvoni in 2014   Hx of hepatitis C 12/23/2018   Hyperlipidemia    Hypertension    Intestinal metaplasia of gastric mucosa 12/23/2018   Leg edema 04/30/2021   Leg edema, right 05/03/2019    Lumbar radiculopathy, right 04/30/2021   Malignant neoplasm of prostate (HCC) 03/28/2019   Mid back pain 04/30/2021   Neuromuscular disorder (HCC)    nerve damage in back   Neuropathy    tingling in toes @ times   Other hemorrhoids 12/23/2018   Palpitations 02/10/2021   Prostate cancer (HCC)    fall 2022, radiation therapy done dr patrcia   PVD (peripheral vascular disease) (HCC)    2 stents right leg and 1 in left    Rectal urgency 12/23/2018   Right hip pain 04/30/2021   Right leg weakness 05/03/2019   Right wrist pain 04/30/2021   S/P insertion of penile implant 11/11/2021   Subepithelial gastric lesion 03/25/2021   Ulcerative chronic pancolitis without complications (HCC) 12/23/2018   Ulcerative colitis (HCC)    in remission since fall of 2022   Ulcerative colitis with complication (HCC) 11/24/2018   Urinary incontinence 11/24/2018   Weakness of both lower extremities 04/30/2021   Weight gain 03/25/2021  Past Surgical History:  Procedure Laterality Date   back surgery     x 2 lumbar last 09/2016 in [place]    BIOPSY  06/02/2021   Procedure: BIOPSY;  Surgeon: Wilhelmenia Aloha Raddle., MD;  Location: [facility];  Service: Gastroenterology;;  EGD and COLON   CARDIAC CATHETERIZATION     CATARACT EXTRACTION, BILATERAL     COLONOSCOPY     last 09/2018 in [place]    COLONOSCOPY WITH PROPOFOL  N/A 06/02/2021   Procedure: COLONOSCOPY WITH PROPOFOL ;  Surgeon: Wilhelmenia Aloha Raddle., MD;  Location: [facility];  Service: Gastroenterology;  Laterality: N/A;   CORONARY BALLOON ANGIOPLASTY N/A 03/21/2020   Procedure: CORONARY BALLOON ANGIOPLASTY;  Surgeon: Mady Bruckner, MD;  Location: [facility];  Service: Cardiovascular;  Laterality: N/A;   CORONARY PRESSURE/FFR STUDY N/A 03/21/2020   Procedure: INTRAVASCULAR PRESSURE WIRE/FFR STUDY;  Surgeon: Mady Bruckner, MD;  Location: [facility];  Service: Cardiovascular;  Laterality: N/A;    ESOPHAGOGASTRODUODENOSCOPY (EGD) WITH PROPOFOL  N/A 06/02/2021   Procedure: ESOPHAGOGASTRODUODENOSCOPY (EGD) WITH PROPOFOL ;  Surgeon: Wilhelmenia Aloha Raddle., MD;  Location: [facility];  Service: Gastroenterology;  Laterality: N/A;   HERNIA REPAIR     right    LEFT HEART CATH AND CORONARY ANGIOGRAPHY N/A 03/21/2020   Procedure: LEFT HEART CATH AND CORONARY ANGIOGRAPHY;  Surgeon: Mady Bruckner, MD;  Location: [facility];  Service: Cardiovascular;  Laterality: N/A;   PENILE PROSTHESIS IMPLANT  1995   PERCUTANEOUS CORONARY STENT INTERVENTION (PCI-S)     x 7-8 heart   PERIPHERAL ARTERIAL STENT GRAFT     PROSTATE BIOPSY     pvd     with stenting x 2 right leg below knee and 1 left thigh    REMOVAL OF PENILE PROSTHESIS N/A 11/11/2021   Procedure: REMOVAL OF PENILE PROSTHESIS AND REPLACEMENT OF PENILE PROSTHESIS [employer];  Surgeon: Lovie Arlyss CROME, MD;  Location[facility];  Service: Urology;  Laterality: N/A;   SHOULDER ARTHROSCOPY Bilateral    TRIGGER FINGER RELEASE Left    UPPER ESOPHAGEAL ENDOSCOPIC ULTRASOUND (EUS) N/A 06/02/2021   Procedure: UPPER ESOPHAGEAL ENDOSCOPIC ULTRASOUND (EUS);  Surgeon: Wilhelmenia Aloha Raddle., MD;  Location: [facility];  Service: Gastroenterology;  Laterality: N/A;   VITRECTOMY Left    left eye   Family History  Problem Relation Age of Onset   Heart disease Mother    Stroke Mother    Breast cancer Mother    Other Father        no info on him or his health   Diabetes Sister    Diabetes Brother    Colon cancer Neg Hx    Esophageal cancer Neg Hx    Inflammatory bowel disease Neg Hx    Liver disease Neg Hx    Pancreatic cancer Neg Hx    Rectal cancer Neg Hx    Stomach cancer Neg Hx    Social History   Socioeconomic History   Marital status: Married    Spouse name: Heron   Number of children: 2   Years of education: Not on file   Highest education level: GED or equivalent  Occupational History    Occupation: retired  Tobacco Use   Smoking status: Former    Current packs/day: 0.00    Average packs/day: 2.0 packs/day for 40.0 years (80.0 ttl pk-yrs)    Types: Cigarettes    Start date: 04/20/1958    Quit date: 04/20/1998    Years since quitting: 25.4   Smokeless  tobacco: Never   Tobacco comments:    quit in 2000s   Vaping Use   Vaping status: Never Used  Substance and Sexual Activity   Alcohol use: Yes    Comment: rare   Drug use: Not Currently   Sexual activity: Yes  Other Topics Concern   Not on file  Social History Narrative   Moved from [place]  in 10/2018   2 sons in 39s as of 11/24/2018    Married wife is DPR Heron x 45 years as of 04/2019   Social Drivers of Health   Financial Resource Strain: Medium Risk (10/10/2023)   Overall Financial Resource Strain (CARDIA)    Difficulty of Paying Living Expenses: Somewhat hard  Food Insecurity: No Food Insecurity (10/10/2023)   Hunger Vital Sign    Worried About Running Out of Food in the Last Year: Never true    Ran Out of Food in the Last Year: Never true  Transportation Needs: No Transportation Needs (10/10/2023)   PRAPARE - Administrator, Civil Service (Medical): No    Lack of Transportation (Non-Medical): No  Physical Activity: Inactive (10/10/2023)   Exercise Vital Sign    Days of Exercise per Week: 0 days    Minutes of Exercise per Session: 0 min  Stress: No Stress Concern Present (10/10/2023)   [employer] of Occupational Health - Occupational Stress Questionnaire    Feeling of Stress: Not at all  Social Connections: Socially Integrated (10/10/2023)   Social Connection and Isolation Panel    Frequency of Communication with Friends and Family: More than three times a week    Frequency of Social Gatherings with Friends and Family: Once a week    Attends Religious Services: 1 to 4 times per year    Active Member of [employer] or Organizations: Yes    Attends Banker Meetings: 1 to 4  times per year    Marital Status: Married    Tobacco Counseling Counseling given: Not Answered Tobacco comments: quit in 2000s     Clinical Intake:  Pre-visit preparation completed: Yes  Pain : 0-10 Pain Score: 8  Pain Type: Acute pain (from a fall) Pain Location: Back Pain Descriptors / Indicators: Nagging Pain Onset: 1 to 4 weeks ago Pain Frequency: Constant     Nutritional Status: BMI 25 -29 Overweight Nutritional Risks: None Diabetes: No  Lab Results  Component Value Date   HGBA1C 5.7 10/26/2022   HGBA1C 5.8 07/15/2021   HGBA1C 4.9 02/06/2020   HGBA1C 4.9 02/06/2020   HGBA1C 4.9 (A) 02/06/2020   HGBA1C 4.9 02/06/2020     How often do you need to have someone help you when you read instructions, pamphlets, or other written materials from your doctor or pharmacy?: 1 - Never  Interpreter Needed?: No  Information entered by :: R. Yuli Lanigan LPN   Activities of Daily Living     10/10/2023    2:16 PM  In your present state of health, do you have any difficulty performing the following activities:  Hearing? 0  Vision? 0  Comment glasses  Difficulty concentrating or making decisions? 1  Walking or climbing stairs? 1  Dressing or bathing? 0  Doing errands, shopping? 0  Preparing Food and eating ? N  Using the Toilet? N  In the past six months, have you accidently leaked urine? Y  Do you have problems with loss of bowel control? Y  Managing your Medications? N  Managing your Finances? N  Housekeeping or  managing your Housekeeping? N    Patient Care Team: Hope Merle, MD as PCP - General (Family Medicine) End, Lonni, MD as PCP - Cardiology (Cardiology) Ottelin, Mark, MD (Inactive) as Consulting Physician (Urology) Patrcia Cough, MD as Consulting Physician (Radiation Oncology) Grayce Buddle, RN Nurse Navigator as Registered Nurse (Medical Oncology)  I have updated your Care Teams any recent Medical Services you may have received from other providers in  the past year.     Assessment:   This is a routine wellness examination for [place].  Hearing/Vision screen Hearing Screening - Comments:: No issues Vision Screening - Comments:: glasses   Goals Addressed             This Visit's Progress    Patient Stated       Wants to try to take some walks       Depression Screen     10/13/2023   11:50 AM 11/27/2022    1:33 PM 10/12/2022    1:16 PM 04/29/2022    1:15 PM 07/10/2021    8:48 AM 02/06/2021   11:24 AM 07/09/2020   11:39 AM  PHQ 2/9 Scores  PHQ - 2 Score 0 0 0 0 0 0 0  PHQ- 9 Score 1 1 6  0       Fall Risk     10/10/2023    2:16 PM 11/27/2022    1:33 PM 10/12/2022    1:07 PM 04/29/2022    1:15 PM 07/10/2021    8:45 AM  Fall Risk   Falls in the past year? 1 0 1 1   Number falls in past yr: 1 0 1 0   Comment   hairline fracture on one wrist, about a year ago    Injury with Fall? 1 0 1 1   Risk for fall due to : History of fall(s);Impaired balance/gait No Fall Risks No Fall Risks;Other (Comment) History of fall(s) Impaired balance/gait  Risk for fall due to: Comment   pt tripped    Follow up Falls evaluation completed;Falls prevention discussed Falls evaluation completed Falls prevention discussed Falls evaluation completed  Falls evaluation completed      Data saved with a previous flowsheet row definition    MEDICARE RISK AT HOME:  Medicare Risk at Home Any stairs in or around the home?: (Patient-Rptd) Yes If so, are there any without handrails?: (Patient-Rptd) No Home free of loose throw rugs in walkways, pet beds, electrical cords, etc?: (Patient-Rptd) Yes Adequate lighting in your home to reduce risk of falls?: (Patient-Rptd) Yes Life alert?: (Patient-Rptd) Yes Use of a cane, walker or w/c?: (Patient-Rptd) Yes Grab bars in the bathroom?: (Patient-Rptd) Yes Shower chair or bench in shower?: (Patient-Rptd) Yes Elevated toilet seat or a handicapped toilet?: (Patient-Rptd) Yes  TIMED UP AND GO:  Was the test  performed?  No  Cognitive Function: 6CIT completed        10/13/2023   11:55 AM 10/12/2022    1:27 PM 07/09/2020   11:43 AM  6CIT Screen  What Year? 0 points 0 points 0 points  What month? 0 points 0 points 0 points  What time? 0 points 0 points 0 points  Count back from 20 0 points 0 points 0 points  Months in reverse 0 points 2 points 0 points  Repeat phrase 0 points 0 points 0 points  Total Score 0 points 2 points 0 points    Immunizations Immunization History  Administered Date(s) Administered   Fluad Quad(high Dose 65+) 01/04/2019,  01/29/2020, 02/05/2021   Hepb-cpg 09/22/2021   Influenza, High Dose Seasonal PF 01/13/2018   PFIZER Comirnaty(Gray Top)Covid-19 Tri-Sucrose Vaccine 02/05/2021   PFIZER(Purple Top)SARS-COV-2 Vaccination 06/18/2019, 07/12/2019, 03/29/2020, 11/20/2020, 02/03/2021   Pfizer Covid-19 Vaccine Bivalent Booster 57yrs & up 03/27/2022   RSV,unspecified 03/27/2022    Screening Tests Health Maintenance  Topic Date Due   DTaP/Tdap/Td (1 - Tdap) Never done   Pneumococcal Vaccine: 50+ Years (1 of 2 - PCV) Never done   Zoster Vaccines- Shingrix (1 of 2) Never done   Hepatitis B Vaccines (2 of 2 - CpG risk 2-dose series) 10/20/2021   COVID-19 Vaccine (7 - 2024-25 season) 12/20/2022   Medicare Annual Wellness (AWV)  10/12/2023   INFLUENZA VACCINE  11/19/2023   Colonoscopy  06/10/2025   Hepatitis C Screening  Completed   HPV VACCINES  Aged Out   Meningococcal B Vaccine  Aged Out    Health Maintenance  Health Maintenance Due  Topic Date Due   DTaP/Tdap/Td (1 - Tdap) Never done   Pneumococcal Vaccine: 50+ Years (1 of 2 - PCV) Never done   Zoster Vaccines- Shingrix (1 of 2) Never done   Hepatitis B Vaccines (2 of 2 - CpG risk 2-dose series) 10/20/2021   COVID-19 Vaccine (7 - 2024-25 season) 12/20/2022   Medicare Annual Wellness (AWV)  10/12/2023   Health Maintenance Items Addressed: Discussed the need to get a flu shot annually and to update his  tetanus (Tdap), covid, pneumonia and shingles vaccines. Patient wants to discuss with his PCP if he needs any more Hepatitis B vaccines.  Additional Screening:  Vision Screening: Recommended annual ophthalmology exams for early detection of glaucoma and other disorders of the eye.Overdue. Patient stated that he will call [facility] and schedule an eye appointment. Would you like a referral to an eye doctor? No    Dental Screening: Recommended annual dental exams for proper oral hygiene  Community Resource Referral / Chronic Care Management: CRR required this visit?  No   CCM required this visit?  No   Plan:    I have personally reviewed and noted the following in the patient's chart:   Medical and social history Use of alcohol, tobacco or illicit drugs  Current medications and supplements including opioid prescriptions. Patient is currently taking opioid prescriptions. Information provided to patient regarding non-opioid alternatives. Patient advised to discuss non-opioid treatment plan with their provider. Functional ability and status Nutritional status Physical activity Advanced directives List of other physicians Hospitalizations, surgeries, and ER visits in previous 12 months Vitals Screenings to include cognitive, depression, and falls Referrals and appointments  In addition, I have reviewed and discussed with patient certain preventive protocols, quality metrics, and best practice recommendations. A written personalized care plan for preventive services as well as general preventive health recommendations were provided to patient.   Angeline Fredericks, LPN   [id number]   After Visit Summary: (MyChart) Due to this being a telephonic visit, the after visit summary with patients personalized plan was offered to patient via MyChart   Notes: Nothing significant to report at this time.

## 2023-10-26 ENCOUNTER — Encounter: Payer: Self-pay | Admitting: Cardiology

## 2023-10-26 ENCOUNTER — Ambulatory Visit: Attending: Cardiology | Admitting: Cardiology

## 2023-10-26 VITALS — BP 116/60 | HR 78 | Ht 67.0 in | Wt 177.0 lb

## 2023-10-26 DIAGNOSIS — E785 Hyperlipidemia, unspecified: Secondary | ICD-10-CM | POA: Diagnosis not present

## 2023-10-26 DIAGNOSIS — I5032 Chronic diastolic (congestive) heart failure: Secondary | ICD-10-CM | POA: Insufficient documentation

## 2023-10-26 DIAGNOSIS — I739 Peripheral vascular disease, unspecified: Secondary | ICD-10-CM | POA: Diagnosis present

## 2023-10-26 DIAGNOSIS — I2510 Atherosclerotic heart disease of native coronary artery without angina pectoris: Secondary | ICD-10-CM | POA: Diagnosis not present

## 2023-10-26 DIAGNOSIS — I10 Essential (primary) hypertension: Secondary | ICD-10-CM | POA: Diagnosis not present

## 2023-10-26 MED ORDER — NITROGLYCERIN 0.4 MG SL SUBL: 0.4000 mg | SUBLINGUAL_TABLET | SUBLINGUAL | 3 refills | Status: AC | PRN

## 2023-10-26 NOTE — Patient Instructions (Signed)
 Medication Instructions:  Your physician recommends that you continue on your current medications as directed. Please refer to the Current Medication list given to you today.   *If you need a refill on your cardiac medications before your next appointment, please call your pharmacy*  Lab Work: No labs ordered today  If you have labs (blood work) drawn today and your tests are completely normal, you will receive your results only by: MyChart Message (if you have MyChart) OR A paper copy in the mail If you have any lab test that is abnormal or we need to change your treatment, we will call you to review the results.  Testing/Procedures: No test ordered today   Follow-Up: At [facility], you and your health needs are our priority.  As part of our continuing mission to provide you with exceptional heart care, our providers are all part of one team.  This team includes your primary Cardiologist (physician) and Advanced Practice Providers or APPs (Physician Assistants and Nurse Practitioners) who all work together to provide you with the care you need, when you need it.  Your next appointment:   6 month(s)  Provider:   Sammy Crisp, MD or Ronald Cockayne, NP

## 2023-10-26 NOTE — Progress Notes (Signed)
 Cardiology Office Note   Date:  10/26/2023  ID:  Sion Reinders, DOB 1950/09/07, MRN [id number] PCP: No primary care provider on file.  [facility] Providers Cardiologist:  [name], MD Cardiology APP:  [name], NP     History of Present Illness Erik Nicholson is a 73 y.o. male with a past medical history of coronary disease status post multiple PCI's, HFpEF, PVD status post bilateral lower extremity stenting followed by vascular surgery, ulcerative colitis, COVID (10/2020), hepatitis C, probable COPD, hypertension, hyperlipidemia, prostate cancer, BPH, who presents today for follow-up.   CAD and PVD, notes indicate he has a history of 7 coronary and 3 lower extremity stents while living in the [place] area.  Most recent LHC in 03/2020 demonstrated severe single-vessel CAD with up to 75% ISR of the mid/distal RCA stent as well as 60 to 70% ostial stenosis of RPDA and RPL 1.  There was mild to moderate noncritical left coronary disease with most severe lesion located in a small to moderate caliber D1 branch that was jailed by prior LAD stent with up to 70% stenosis.  Normal LV systolic function with mildly elevated filling pressures.  He underwent PTCA of the mid/distal RCA with reduction in stenosis from 75% to 20% with TIMI-3 flow.  Stent placement was not attempted due to multiple layers of stent already present and incomplete expansion of a 3.5 mm noncompliant balloon at 22 ATM.  With percutaneous intervention he did note improvement in chest pain, though did continue to have significant exertional dyspnea.  PFTs showed probable small obstructive airway disease with bronchodilator response, for which he is followed by pulmonology.  Echo from 05/14/2020 showed an EF of 55 to 60%, no regional wall motion abnormalities, mild LVH, grade 1 diastolic dysfunction, normal RV systolic function and ventricular cavity size, trivial mitral regurgitation, and an estimated right atrial  pressure of 3 mmHg.  PFTs in 05/2020 showed evidence of obstructive airway disease.  Was seen in the office 11/18/2020 continue to note chronic exertional dyspnea.  He was euvolemic on exam but given his symptoms underwent Lexiscan  MPI 11/25/2020 which showed no significant ischemia was low risk study with an EF of 55 to 65%.  He wore an event monitor 02/2021 which revealed predominant rhythm of sinus with an average heart rate of 77 bpm, a single 4 beat run of NSVT occurred with maximal rate of 193 bpm, 6 atrial runs lasting up to 7 beats at a maximal rate of 139 bpm with rare PACs and PVCs with no sustained arrhythmias or prolonged pauses.   He was last seen in clinic 05/11/2023 stating been doing well from a cardiac perspective.  He had no symptoms of angina or anginal equivalents.  Was continued on his current medication regimen with no further testing that was needed at that time.  Preoperative cardiovascular clearance was offered for his upcoming procedure.  He was also advised to hold his Plavix  for 5 days.  He returns to clinic today stating that overall from a cardiac perspective he has been doing well.  He did have 1 visit to the emergency department back in June after a fall.  Since that time he is continue to have some back discomfort.  He states that he has been compliant with his current medication regimen.  Has not had any undue side effects.  Does request a refill of Nitrostat  today.  ROS: 10 point review of system has been reviewed and considered negative except what  is listed in the HPI  Studies Reviewed EKG Interpretation Date/Time:  Tuesday October 26 2023 10:01:56 EDT Ventricular Rate:  78 PR Interval:  182 QRS Duration:  106 QT Interval:  392 QTC Calculation: 446 R Axis:   -13  Text Interpretation: Normal sinus rhythm Minimal voltage criteria for LVH, may be normal variant ( Cornell product ) Inferior infarct (cited on or before 31-Oct-2020) When compared with ECG of 11-May-2023 15:12,  No significant change since last tracing Confirmed by [name] ([id number]) on 10/26/2023 10:12:59 AM    2D echo 05/01/2022: 1. Left ventricular ejection fraction, by estimation, is 55 to 60%. The  left ventricle has normal function. The left ventricle has no regional  wall motion abnormalities. Left ventricular diastolic parameters are  consistent with Grade I diastolic  dysfunction (impaired relaxation). The average left ventricular global  longitudinal strain is -16.4 %.   2. Right ventricular systolic function is normal. The right ventricular  size is normal. There is normal pulmonary artery systolic pressure. The  estimated right ventricular systolic pressure is 20.3 mmHg.   3. The mitral valve is normal in structure. No evidence of mitral valve  regurgitation. No evidence of mitral stenosis.   4. The aortic valve is tricuspid. Aortic valve regurgitation is not  visualized. No aortic stenosis is present.   5. The inferior vena cava is normal in size with greater than 50%  respiratory variability, suggesting right atrial pressure of 3 mmHg.    Zio patch 02/2021: The patient was monitored for 12 days, 23 hours. Predominant rhythm was sinus with an average rate of 77 bpm (range 52-118 bpm in sinus). There were rare PACs and PVCs. A single 4 beat run of nonsustained ventricular tachycardia occurred, with a maximum rate of 193 bpm. There were six atrial runs lasting up to 7 beats with a maximum rate of 139 bpm. No sustained arrhythmia or prolonged pause was observed. Patient triggered events correspond to sinus rhythm and PACs.   Predominantly sinus rhythm with rare PACs and PVCs.  A few brief episodes of PSVT and NSVT occurred.   Lexiscan  MPI 11/2020: ST segment elevation was noted during stress. No T wave inversion was noted during stress. The study is normal. This is a low risk study. The left ventricular ejection fraction is normal (55-65%). CT attenuation images showed  significant coronary calcifications and mild aortic calcifications.   2D echo 05/14/2020: 1. Left ventricular ejection fraction, by estimation, is 55 to 60%. The  left ventricle has normal function. The left ventricle has no regional  wall motion abnormalities. There is mild left ventricular hypertrophy.  Left ventricular diastolic parameters  are consistent with Grade I diastolic dysfunction (impaired relaxation).  The average left ventricular global longitudinal strain is -16.3 %.   2. Right ventricular systolic function is normal. The right ventricular  size is normal.   3. The mitral valve is grossly normal. Trivial mitral valve  regurgitation. No evidence of mitral stenosis.   4. The aortic valve was not well visualized. Aortic valve regurgitation  is not visualized. No aortic stenosis is present.   5. The inferior vena cava is normal in size with greater than 50%  respiratory variability, suggesting right atrial pressure of 3 mmHg.   LHC 03/21/2020: Conclusions: Severe single-vessel coronary artery disease with up to 75% in-stent restenosis of the mid/distal RCA (FFR 0.70), as well as 60-70% ostial stenoses of rPDA and rPL1. Mild to moderate, non-critical left coronary artery disease.  The most severe  lesion is a small to moderate caliber D1 branch that is jailed by LAD stent with up to 70% stenosis. Normal left ventricular systolic function with mildly elevated filling pressure. Successful PTCA of mid/distal RCA with reduction in stenosis from 75% to 20% with TIMI-3 flow.  Stent placement not attempted due to multiple layers of stent already present and incomplete expansion of 3.5 mm non-compliant balloon at 22 atm.   Recommendations: Continue aggressive secondary prevention. Indefinite dual antiplatelet therapy with aspirin  and clopidogrel ; discontinuation of aspirin  could be considered after 6 months of therapy. Anticipate same-day discharge if no post-catheterization complication  occur.   Lexiscan  MPI 03/07/2019: Normal pharmacologic myocardial perfusion stress test without significant ischemia or scar. The left ventricular ejection fraction is normal by visual estimation and Siemens calculation (56%). Dense coronary artery calcifications and/or prior coronary stents are noted on the attenuation correction CT. This is a low risk study.   2D echo 03/02/2019: 1. Left ventricular ejection fraction, by visual estimation, is 60 to  65%. The left ventricle has normal function. There is no left ventricular  hypertrophy.   2. Left ventricular diastolic parameters are consistent with Grade I  diastolic dysfunction (impaired relaxation).   3. Global right ventricle has normal systolic function.The right  ventricular size is normal. No increase in right ventricular wall  thickness.   4. Left atrial size was normal.   5. Normal pulmonary artery systolic pressure.   LHC 02/25/2012 ([facility]): Patent stents in the proximal and mid RCA, 40% mid RCA stenosis, normal left heart pressures, normal LV systolic function Risk Assessment/Calculations           Physical Exam VS:  BP 116/60   Pulse 78   Ht 5' 7 (1.702 m)   Wt 177 lb (80.3 kg)   SpO2 96%   BMI 27.72 kg/m        Wt Readings from Last 3 Encounters:  10/26/23 177 lb (80.3 kg)  10/13/23 180 lb (81.6 kg)  09/23/23 186 lb 1.1 oz (84.4 kg)    GEN: Well nourished, well developed in no acute distress NECK: No JVD; No carotid bruits CARDIAC: RRR, no murmurs, rubs, gallops RESPIRATORY:  Clear to auscultation without rales, wheezing or rhonchi  ABDOMEN: Soft, non-tender, non-distended EXTREMITIES:  No edema; No deformity   ASSESSMENT AND PLAN Coronary artery disease involving native coronary arteries without angina and chronic dyspnea.  He has done well without symptoms of angina or anginal equivalents.  He has a longstanding history of exertional dyspnea that is stable with the use of his  inhaler.  He has been continued on clopidogrel  75 mg daily and atorvastatin  40 mg daily.  EKG today reveals sinus rhythm with rate of 78 with LVH and old inferior infarct with no acute change from prior studies.  No further ischemic evaluation is needed at this time.  HFpEF with an LVEF of 55 to 60%, no RWMA, G1 DD, no evidence of valvular abnormalities.  He is euvolemic on exam.  NYHA class II symptoms.  He does not require loop diuretics.  The addition of SGLT2 inhibitor and MRA therapy has been deferred due to previous episodes of hypotension on chronic pain medication.  If you were to develop heart failure symptoms moving forward would escalate GDMT as tolerated by blood pressure and kidney function.    Primary hypertension with blood pressure today 116/60.  He is continued on Toprol -XL 100 mg daily and telmisartan  HCTZ 80/12.5 mg daily.  He has been encouraged  to continue to monitor his blood pressures 1 to 2 hours postmedication administration at home as well.  Mixed hyperlipidemia with upcoming labs with his PCP who require an updated lipid panel.  Last LDL was 58.  He is continued on atorvastatin  40 mg daily.  Peripheral arterial disease with no symptoms concerning for claudication.  He continues to be followed by [facility].  He remains on clopidogrel  75 mg daily and atorvastatin  40 mg daily.       Dispo: Patient to return to clinic to see MD/APP in 6 months or sooner if needed for further evaluation  Signed, Erik Royster, NP

## 2023-11-16 ENCOUNTER — Encounter: Admitting: Nurse Practitioner

## 2023-12-31 ENCOUNTER — Other Ambulatory Visit: Payer: Self-pay | Admitting: Nurse Practitioner

## 2023-12-31 DIAGNOSIS — U071 COVID-19: Secondary | ICD-10-CM

## 2023-12-31 DIAGNOSIS — J9801 Acute bronchospasm: Secondary | ICD-10-CM

## 2023-12-31 NOTE — Telephone Encounter (Unsigned)
 Copied from CRM [id number]. Topic: Clinical - Medication Refill >> Dec 31, 2023  4:02 PM Viola F wrote: Medication: albuterol  (VENTOLIN  HFA) 108 (90 Base) MCG/ACT inhaler [[id number]]  Has the patient contacted their pharmacy? Yes (Agent: If no, request that the patient contact the pharmacy for the refill. If patient does not wish to contact the pharmacy document the reason why and proceed with request.) (Agent: If yes, when and what did the pharmacy advise?)  This is the patient's preferred pharmacy:  [facility] [phone] [facility] [place] - [address] [place] [address] Phone: [phone] Fax: [phone]  Is this the correct pharmacy for this prescription? Yes If no, delete pharmacy and type the correct one.   Has the prescription been filled recently? Yes  Is the patient out of the medication? Yes  Has the patient been seen for an appointment in the last year OR does the patient have an upcoming appointment? Yes  Can we respond through MyChart? Yes  Agent: Please be advised that Rx refills may take up to 3 business days. We ask that you follow-up with your pharmacy.

## 2024-01-03 MED ORDER — ALBUTEROL SULFATE HFA 108 (90 BASE) MCG/ACT IN AERS: INHALATION_SPRAY | RESPIRATORY_TRACT | 3 refills | Status: DC

## 2024-01-13 NOTE — Progress Notes (Signed)
 Chief Complaint  Patient presents with   Follow-up    Pt in room 3. Alone. Here for tingling/numbness head to toes and shock sensations follow up.      ASSESSMENT AND PLAN  Erik Nicholson is a 73 y.o. male   Recurrent episode of traveling paresthesias throughout his body, could not respond to surrounding during the spells,  Unsure etiology, differential diagnosis including focal seizure vs complicated migraine vs presyncope  Discussed potentially initiating ASM but as events occur infrequently, he declines interest in this. He is willing to try a seizure rescue. Will start Nayzilam  as needed, discussed potential side effects and educated on use. If events become more frequent, would recommend he further consider trial of ASM which can also help to distinguish if these are a type of seizure. Can also consider prolonged EEG.  He verbalized understanding.   MRI of the brain 01/2023 chronic left cerebellar stroke and chronic left temporal microhemorrhage  EEG no seizure activity  Carotid ultrasound no significant narrowing     Follow up in 6 months with Dr. Onita or call earlier if needed     DIAGNOSTIC DATA (LABS, IMAGING, TESTING) - I reviewed patient records, labs, notes, testing and imaging myself where available.   MEDICAL HISTORY:  Update 01/17/2024 JM: Patient returns for follow-up visit unaccompanied.  After prior visit, he completed EEG which showed mild background slowing but no definite seizure activity.  MRI brain showed chronic left cerebellar ischemic infarcts and chronic cerebral microhemorrhage in posterior temporal region.  Carotid ultrasound no significant narrowing.  Cardiac monitoring without concerning arrhythmias.  Patient reports having about 3 additional episodes over the past year.  Most recently occurred last Friday.  He describes episodes consisting of abrupt onset of numbness that starts at the top of his head and travels down his body gradually into his  fingertips and toes, he feels weak all over, blurred vision and difficulty breathing. He can't move or speak, feels like I am dying, symptoms gradually start to resolve with an episode lasting no more than 5 minutes.  Denies any postictal symptoms.  Denies any warning the episode may occur and has not identified any specific triggers. Denies any headache during this time.        Consult visit 01/06/2023 Dr. Onita: [name], seen in request by PCP Glenys Ferrari, MD  I reviewed and summarized the referring note.PMHX HTN HLD CAD,  History of prostate cancer, s/p radiation Lumbar decompression surgery for right lumbar radiculopathy Hx of hepatitis C Peripheral vascular disease  Could not walk, electrical shock since Jan 2024, sensation of Tens unit entire body, maxim strength, 8 mintues,  whole body chair,  head down.  He has it sporacic, last week, sitting down watch TV,  could not see it, conufsed,  could not do anythings, top of head, sinking down into his body, if he cloase his eye, he will not be able to open it again,  he could not communicate  A month back, wife try to ask his wife, to touch her, verbally able to tel lhis wife,  scary,  could nto anythigns,   Everytim, he was sittign, getting to the car, sat down,        PHYSICAL EXAM:   Vitals:   01/17/24 0758  BP: 127/68  Pulse: (!) 59  Weight: 176 lb (79.8 kg)  Height: 5' 7 (1.702 m)     Body mass index is 27.57 kg/m.  PHYSICAL EXAMNIATION:  Gen: NAD, conversant, well nourised, well groomed                     Cardiovascular: Regular rate rhythm, no peripheral edema, warm, nontender. Eyes: Conjunctivae clear without exudates or hemorrhage Neck: Supple, no carotid bruits. Pulmonary: Clear to auscultation bilaterally   NEUROLOGICAL EXAM:  MENTAL STATUS: Speech/cognition: Awake, alert, oriented to history taking and casual conversation CRANIAL NERVES: CN II: Visual fields are full to confrontation.  Pupils are round equal and briskly reactive to light. CN III, IV, VI: extraocular movement are normal. No ptosis. CN V: Facial sensation is intact to light touch CN VII: Face is symmetric with normal eye closure  CN VIII: Hearing is normal to causal conversation. CN IX, X: Phonation is normal. CN XI: Head turning and shoulder shrug are intact  MOTOR: Full strength upper lower extremities except mild RLE hip flexor weakness  REFLEXES: Reflexes are 1 and symmetric at the biceps, triceps, knees, and ankles. Plantar responses are flexor.  SENSORY: Intact to light touch, pinprick and vibratory sensation are intact in fingers and toes.  COORDINATION: There is no trunk or limb dysmetria noted.  GAIT/STANCE: Push-up to get up from seated position, mildly antalgic  REVIEW OF SYSTEMS:  Full 14 system review of systems performed and notable only for as above All other review of systems were negative.   ALLERGIES: Allergies  Allergen Reactions   Duloxetine  Other (See Comments)    duloxetine  hydrochloride   Cymbalta  [Duloxetine  Hcl] Other (See Comments)    drowsiness    HOME MEDICATIONS: Current Outpatient Medications  Medication Sig Dispense Refill   acetaminophen  (TYLENOL ) 500 MG tablet Take 500-1,000 mg by mouth every 6 (six) hours as needed (pain.).     albuterol  (VENTOLIN  HFA) 108 (90 Base) MCG/ACT inhaler INHALE 1-2 PUFFS BY MOUTH EVERY 6 HOURS AS NEEDED FOR WHEEZE OR SHORTNESS OF BREATH 18 each 3   allopurinol (ZYLOPRIM) 100 MG tablet Take 100 mg by mouth 2 (two) times daily.     aspirin  EC 81 MG tablet Take 81 mg by mouth daily. Take while plavix  is held per dr end     atorvastatin  (LIPITOR) 40 MG tablet Take 1 tablet (40 mg total) by mouth daily. 30 tablet 5   clopidogrel  (PLAVIX ) 75 MG tablet TAKE 1 TABLET BY MOUTH EVERY DAY 90 tablet 2   hyoscyamine  (LEVBID ) 0.375 MG 12 hr tablet Take 1 tablet (0.375 mg total) by mouth every 12 (twelve) hours as needed. 60 tablet 2    mesalamine  (LIALDA ) 1.2 g EC tablet Take 2 tablets (2.4 g total) by mouth 2 (two) times daily. 120 tablet 6   metoprolol  succinate (TOPROL -XL) 100 MG 24 hr tablet TAKE 1 TABLET BY MOUTH EVERY DAY WITH OR IMMEDIATELY FOLLOWING A MEAL 90 tablet 2   nitroGLYCERIN  (NITROSTAT ) 0.4 MG SL tablet Place 1 tablet (0.4 mg total) under the tongue every 5 (five) minutes as needed for chest pain. 25 tablet 3   oxyCODONE -acetaminophen  (PERCOCET) 10-325 MG tablet Take 1 tablet by mouth 4 (four) times daily as needed.     telmisartan -hydrochlorothiazide (MICARDIS  HCT) 80-12.5 MG tablet Take 1 tablet by mouth daily. In am d/c 40-12.5 mg dose 90 tablet 0   tiZANidine  (ZANAFLEX ) 4 MG capsule Take 4 mg by mouth 3 (three) times daily as needed.     cycloSPORINE (RESTASIS) 0.05 % ophthalmic emulsion Place 1 drop into both eyes 2 (two) times daily. (Patient not taking: Reported on 10/26/2023)  esomeprazole  (NEXIUM ) 40 MG capsule Take 1 capsule (40 mg total) by mouth daily at 12 noon. 30 capsule 6   No current facility-administered medications for this visit.    PAST MEDICAL HISTORY: Past Medical History:  Diagnosis Date   Abdominal gas pain 12/23/2018   Abnormal gait 04/30/2021   Abnormal liver diagnostic imaging 09/16/2021   Abnormal MRI, lumbar spine 05/09/2021   Abnormal MRI, thoracic spine 05/09/2021   Abnormal weight loss 11/24/2018   Accelerating angina (HCC) 03/15/2020   Antiplatelet or antithrombotic long-term use 12/23/2018   Arthritis    both hands   Arthritis of right hip 05/05/2021   Benign prostatic hyperplasia with post-void dribbling 11/24/2018   BPH (benign prostatic hyperplasia)    CAD (coronary artery disease)    x 7 cardiac stents   Chronic back pain    L4/5 with chronic right leg pain    Chronic pain syndrome 11/24/2018   Chronic radicular lumbar pain (Right) 01/17/2019   Claudication in peripheral vascular disease 01/07/2019   COVID-19    10/20/20   Dyspnea on exertion 08/17/2020    Floater, vitreous, left    Gastric nodule 05/31/2020   History of chronic ulcerative colitis 05/31/2020   History of lumbar surgery (x2) 01/17/2019   History of prostate cancer 02/10/2021   Hx of hepatitis C    treated with Harvoni in 2014   Hx of hepatitis C 12/23/2018   Hyperlipidemia    Hypertension    Intestinal metaplasia of gastric mucosa 12/23/2018   Leg edema 04/30/2021   Leg edema, right 05/03/2019   Lumbar radiculopathy, right 04/30/2021   Malignant neoplasm of prostate (HCC) 03/28/2019   Mid back pain 04/30/2021   Neuromuscular disorder (HCC)    nerve damage in back   Neuropathy    tingling in toes @ times   Other hemorrhoids 12/23/2018   Palpitations 02/10/2021   Prostate cancer (HCC)    fall 2022, radiation therapy done dr patrcia   PVD (peripheral vascular disease)    2 stents right leg and 1 in left    Rectal urgency 12/23/2018   Right hip pain 04/30/2021   Right leg weakness 05/03/2019   Right wrist pain 04/30/2021   S/P insertion of penile implant 11/11/2021   Subepithelial gastric lesion 03/25/2021   Ulcerative chronic pancolitis without complications (HCC) 12/23/2018   Ulcerative colitis (HCC)    in remission since fall of 2022   Ulcerative colitis with complication (HCC) 11/24/2018   Urinary incontinence 11/24/2018   Weakness of both lower extremities 04/30/2021   Weight gain 03/25/2021    PAST SURGICAL HISTORY: Past Surgical History:  Procedure Laterality Date   back surgery     x 2 lumbar last 09/2016 in [place]    BIOPSY  06/02/2021   Procedure: BIOPSY;  Surgeon: Wilhelmenia Aloha Raddle., MD;  Location: [facility];  Service: Gastroenterology;;  EGD and COLON   CARDIAC CATHETERIZATION     CATARACT EXTRACTION, BILATERAL     COLONOSCOPY     last 09/2018 in [place]    COLONOSCOPY WITH PROPOFOL  N/A 06/02/2021   Procedure: COLONOSCOPY WITH PROPOFOL ;  Surgeon: Wilhelmenia Aloha Raddle., MD;  Location: [facility];  Service:  Gastroenterology;  Laterality: N/A;   CORONARY BALLOON ANGIOPLASTY N/A 03/21/2020   Procedure: CORONARY BALLOON ANGIOPLASTY;  Surgeon: Mady Bruckner, MD;  Location: [facility];  Service: Cardiovascular;  Laterality: N/A;   CORONARY PRESSURE/FFR STUDY N/A 03/21/2020   Procedure: INTRAVASCULAR PRESSURE WIRE/FFR STUDY;  Surgeon: Mady Bruckner, MD;  Location: [facility];  Service: Cardiovascular;  Laterality: N/A;   ESOPHAGOGASTRODUODENOSCOPY (EGD) WITH PROPOFOL  N/A 06/02/2021   Procedure: ESOPHAGOGASTRODUODENOSCOPY (EGD) WITH PROPOFOL ;  Surgeon: Wilhelmenia Aloha Raddle., MD;  Location: [facility];  Service: Gastroenterology;  Laterality: N/A;   HERNIA REPAIR     right    LEFT HEART CATH AND CORONARY ANGIOGRAPHY N/A 03/21/2020   Procedure: LEFT HEART CATH AND CORONARY ANGIOGRAPHY;  Surgeon: Mady Bruckner, MD;  Location: [facility];  Service: Cardiovascular;  Laterality: N/A;   PENILE PROSTHESIS IMPLANT  1995   PERCUTANEOUS CORONARY STENT INTERVENTION (PCI-S)     x 7-8 heart   PERIPHERAL ARTERIAL STENT GRAFT     PROSTATE BIOPSY     pvd     with stenting x 2 right leg below knee and 1 left thigh    REMOVAL OF PENILE PROSTHESIS N/A 11/11/2021   Procedure: REMOVAL OF PENILE PROSTHESIS AND REPLACEMENT OF PENILE PROSTHESIS [employer];  Surgeon: Lovie Arlyss CROME, MD;  Location: [facility];  Service: Urology;  Laterality: N/A;   SHOULDER ARTHROSCOPY Bilateral    TRIGGER FINGER RELEASE Left    UPPER ESOPHAGEAL ENDOSCOPIC ULTRASOUND (EUS) N/A 06/02/2021   Procedure: UPPER ESOPHAGEAL ENDOSCOPIC ULTRASOUND (EUS);  Surgeon: Wilhelmenia Aloha Raddle., MD;  Location: [facility];  Service: Gastroenterology;  Laterality: N/A;   VITRECTOMY Left    left eye    FAMILY HISTORY: Family History  Problem Relation Age of Onset   Heart disease Mother    Stroke Mother    Breast cancer Mother    Other Father        no info on him or his health   Diabetes  Sister    Diabetes Brother    Colon cancer Neg Hx    Esophageal cancer Neg Hx    Inflammatory bowel disease Neg Hx    Liver disease Neg Hx    Pancreatic cancer Neg Hx    Rectal cancer Neg Hx    Stomach cancer Neg Hx     SOCIAL HISTORY: Social History   Socioeconomic History   Marital status: Married    Spouse name: Heron   Number of children: 2   Years of education: Not on file   Highest education level: GED or equivalent  Occupational History   Occupation: retired  Tobacco Use   Smoking status: Former    Current packs/day: 0.00    Average packs/day: 2.0 packs/day for 40.0 years (80.0 ttl pk-yrs)    Types: Cigarettes    Start date: 04/20/1958    Quit date: 04/20/1998    Years since quitting: 25.7   Smokeless tobacco: Never   Tobacco comments:    quit in 2000s   Vaping Use   Vaping status: Never Used  Substance and Sexual Activity   Alcohol use: Not Currently    Comment: rare   Drug use: Not Currently   Sexual activity: Yes  Other Topics Concern   Not on file  Social History Narrative   Moved from [place]  in 10/2018   2 sons in 53s as of 11/24/2018    Married wife is DPR Heron x 45 years as of 04/2019   Social Drivers of Health   Financial Resource Strain: Medium Risk (10/10/2023)   Overall Financial Resource Strain (CARDIA)    Difficulty of Paying Living Expenses: Somewhat hard  Food Insecurity: No Food Insecurity (10/10/2023)   Hunger Vital Sign    Worried About Running Out of Food in the Last  Year: Never true    Ran Out of Food in the Last Year: Never true  Transportation Needs: No Transportation Needs (10/10/2023)   PRAPARE - Administrator, Civil Service (Medical): No    Lack of Transportation (Non-Medical): No  Physical Activity: Inactive (10/10/2023)   Exercise Vital Sign    Days of Exercise per Week: 0 days    Minutes of Exercise per Session: 0 min  Stress: No Stress Concern Present (10/10/2023)   [employer] of Occupational  Health - Occupational Stress Questionnaire    Feeling of Stress: Not at all  Social Connections: Socially Integrated (10/10/2023)   Social Connection and Isolation Panel    Frequency of Communication with Friends and Family: More than three times a week    Frequency of Social Gatherings with Friends and Family: Once a week    Attends Religious Services: 1 to 4 times per year    Active Member of [employer] or Organizations: Yes    Attends Banker Meetings: 1 to 4 times per year    Marital Status: Married  Catering manager Violence: Not At Risk (10/13/2023)   Humiliation, Afraid, Rape, and Kick questionnaire    Fear of Current or Ex-Partner: No    Emotionally Abused: No    Physically Abused: No    Sexually Abused: No      I personally spent a total of 30 minutes in the care of the patient today including preparing to see the patient, performing a medically appropriate exam/evaluation, counseling and educating, placing orders, and documenting clinical information in the EHR.  Harlene Bogaert, AGNP-BC  [facility] [address] [place], [place] [address]  Phone [phone] Fax [phone] Note: This document was prepared with digital dictation and possible smart phrase technology. Any transcriptional errors that result from this process are unintentional.

## 2024-01-17 ENCOUNTER — Encounter: Payer: Self-pay | Admitting: Adult Health

## 2024-01-17 ENCOUNTER — Ambulatory Visit: Admitting: Adult Health

## 2024-01-17 VITALS — BP 127/68 | HR 59 | Ht 67.0 in | Wt 176.0 lb

## 2024-01-17 DIAGNOSIS — R569 Unspecified convulsions: Secondary | ICD-10-CM | POA: Diagnosis not present

## 2024-01-17 DIAGNOSIS — Z8673 Personal history of transient ischemic attack (TIA), and cerebral infarction without residual deficits: Secondary | ICD-10-CM

## 2024-01-17 MED ORDER — NAYZILAM 5 MG/0.1ML NA SOLN: 5.0000 mg | NASAL | 5 refills | Status: AC | PRN

## 2024-01-17 NOTE — Patient Instructions (Addendum)
 Your Plan:  Recommend starting Nayzilam  nasal spray which you will use at onset of these events   Use nasal spray as a single dose in 1 nostril (do not prime); may repeat same dose in 10 minutes in alternate nostril based on response and tolerability (do not repeat if the patient is having trouble breathing or excessive sedation). Maximum dose: 10 mg (2 sprays) per single episode. Maximum treatment frequency: Treatment of 1 episode every 3 days and treatment of 5 episodes in 1 month.  Please call if episodes become more frequent and we may have to consider starting a daily antiseizure medication - it is unclear if these events are seizures at this present time. Sometimes we will start on an antiseizure medication to see if this helps stop the events from occurring but as they are very infrequent, we can hold off on doing this at this time.       Follow up in 6 months with Dr. Onita or call earlier if needed     Thank you for coming to see us  at [facility]. I hope we have been able to provide you high quality care today.  You may receive a patient satisfaction survey over the next few weeks. We would appreciate your feedback and comments so that we may continue to improve ourselves and the health of our patients.

## 2024-01-28 ENCOUNTER — Ambulatory Visit: Admitting: Nurse Practitioner

## 2024-01-28 ENCOUNTER — Encounter: Payer: Self-pay | Admitting: Nurse Practitioner

## 2024-01-28 VITALS — BP 180/78 | HR 66 | Temp 98.2°F | Ht 67.0 in | Wt 176.0 lb

## 2024-01-28 DIAGNOSIS — I10 Essential (primary) hypertension: Secondary | ICD-10-CM | POA: Diagnosis not present

## 2024-01-28 DIAGNOSIS — R7303 Prediabetes: Secondary | ICD-10-CM

## 2024-01-28 DIAGNOSIS — K5190 Ulcerative colitis, unspecified, without complications: Secondary | ICD-10-CM | POA: Diagnosis not present

## 2024-01-28 DIAGNOSIS — E785 Hyperlipidemia, unspecified: Secondary | ICD-10-CM

## 2024-01-28 DIAGNOSIS — R569 Unspecified convulsions: Secondary | ICD-10-CM

## 2024-01-28 MED ORDER — TELMISARTAN-HCTZ 80-12.5 MG PO TABS: 2.0000 | ORAL_TABLET | Freq: Every day | ORAL | 3 refills | Status: AC

## 2024-01-28 MED ORDER — CLONIDINE HCL 0.1 MG PO TABS: 0.1000 mg | ORAL_TABLET | Freq: Two times a day (BID) | ORAL | 3 refills | Status: DC

## 2024-01-28 NOTE — Progress Notes (Signed)
 Erik Glance, NP-C Phone: [phone]  Erik Nicholson is a 73 y.o. male who presents today for transfer of care.   Discussed the use of AI scribe software for clinical note transcription with the patient, who gave verbal consent to proceed.  History of Present Illness   Erik Nicholson is a 73 year old male with hypertension who presents for transfer of care with uncontrolled high blood pressure and associated symptoms.  He reports high blood pressure despite being on two time-release medications, Toprol  XL 100 mg and telmisartan  hydrochlorothiazide. His blood pressure readings fluctuate significantly, ranging from 150-160/80 to as high as 216/101. He experiences severe headaches, particularly behind his eyes, when his blood pressure is elevated, describing the sensation as 'eyes popping out of my head' and 'real warm'. He has had episodes where his blood pressure reached 220/100 while preparing for a stent placement in the hospital.  He describes intermittent chest pain, which he manages with nitroglycerin  as needed, and constant shortness of breath, requiring the use of an inhaler three to four times a day. He struggles to walk even short distances without needing to stop to catch his breath and has difficulty climbing stairs due to knee pain and shortness of breath.  He describes episodes occurring about once a month that feel like 'electrical charge, real mild electrical' sensations starting at the head and traveling down to his feet. He feels unable to move during these episodes and likens the sensation to 'going down in a hole'. He has been informed of a small brain bleed discovered in a scan last year, though he does not recall the details.  He has a history of ulcerative colitis and cirrhosis, for which he sees a gastroenterologist. He also has a history of stroke and heart issues and is under the care of a cardiologist. He experiences dizziness when his head moves, but denies swelling,  vision changes, or slurred speech. He uses eye drops for dry eyes.  He takes Lipitor for cholesterol management and iron supplements for anemia, though he reports feeling cold frequently. He has not smoked in 20-30 years. He receives a flu shot annually, typically in December or January. His wife insists on documenting his blood pressure readings daily, though he finds it difficult to concentrate during episodes of high blood pressure.      Social History   Tobacco Use  Smoking Status Former   Current packs/day: 0.00   Average packs/day: 2.0 packs/day for 40.0 years (80.0 ttl pk-yrs)   Types: Cigarettes   Start date: 04/20/1958   Quit date: 04/20/1998   Years since quitting: 25.8  Smokeless Tobacco Never  Tobacco Comments   quit in 2000s     Current Outpatient Medications on File Prior to Visit  Medication Sig Dispense Refill   acetaminophen  (TYLENOL ) 500 MG tablet Take 500-1,000 mg by mouth every 6 (six) hours as needed (pain.).     albuterol  (VENTOLIN  HFA) 108 (90 Base) MCG/ACT inhaler INHALE 1-2 PUFFS BY MOUTH EVERY 6 HOURS AS NEEDED FOR WHEEZE OR SHORTNESS OF BREATH 18 each 3   allopurinol (ZYLOPRIM) 100 MG tablet Take 100 mg by mouth 2 (two) times daily.     atorvastatin  (LIPITOR) 40 MG tablet Take 1 tablet (40 mg total) by mouth daily. 30 tablet 5   clopidogrel  (PLAVIX ) 75 MG tablet TAKE 1 TABLET BY MOUTH EVERY DAY 90 tablet 2   hyoscyamine  (LEVBID ) 0.375 MG 12 hr tablet Take 1 tablet (0.375 mg total) by mouth every 12 (  twelve) hours as needed. 60 tablet 2   mesalamine  (LIALDA ) 1.2 g EC tablet Take 2 tablets (2.4 g total) by mouth 2 (two) times daily. 120 tablet 6   metoprolol  succinate (TOPROL -XL) 100 MG 24 hr tablet TAKE 1 TABLET BY MOUTH EVERY DAY WITH OR IMMEDIATELY FOLLOWING A MEAL 90 tablet 2   Midazolam  (NAYZILAM ) 5 MG/0.1ML SOLN Place 5 mg into the nose as needed. As needed for seizure rescue, 5 mg (1 spray) as a single dose in 1 nostril; may repeat same dose in 10 minutes in  alternate nostril based on response and tolerability (do not repeat if the patient is having trouble breathing or excessive sedation). Maximum dose: 10 mg (2 sprays) per single episode. Maximum treatment frequency: Treatment of 1 episode every 3 days and treatment of 5 episodes in 1 month. 1 each 5   nitroGLYCERIN  (NITROSTAT ) 0.4 MG SL tablet Place 1 tablet (0.4 mg total) under the tongue every 5 (five) minutes as needed for chest pain. 25 tablet 3   oxyCODONE -acetaminophen  (PERCOCET) 10-325 MG tablet Take 1 tablet by mouth 4 (four) times daily as needed.     tiZANidine  (ZANAFLEX ) 4 MG capsule Take 4 mg by mouth 3 (three) times daily as needed.     aspirin  EC 81 MG tablet Take 81 mg by mouth daily. Take while plavix  is held per dr end (Patient not taking: Reported on 01/28/2024)     No current facility-administered medications on file prior to visit.     ROS see history of present illness  Objective  Physical Exam Vitals:   01/28/24 1329 01/28/24 1407  BP: (!) 192/80 (!) 180/78  Pulse: 66   Temp: 98.2 F (36.8 C)   SpO2: 99%     BP Readings from Last 3 Encounters:  01/28/24 (!) 180/78  01/17/24 127/68  10/26/23 116/60   Wt Readings from Last 3 Encounters:  01/28/24 176 lb (79.8 kg)  01/17/24 176 lb (79.8 kg)  10/26/23 177 lb (80.3 kg)    Physical Exam Constitutional:      General: He is not in acute distress.    Appearance: Normal appearance.  HENT:     Head: Normocephalic.  Cardiovascular:     Rate and Rhythm: Normal rate and regular rhythm.     Heart sounds: Normal heart sounds.  Pulmonary:     Effort: Pulmonary effort is normal.     Breath sounds: Normal breath sounds.  Skin:    General: Skin is warm and dry.  Neurological:     General: No focal deficit present.     Mental Status: He is alert.  Psychiatric:        Mood and Affect: Mood normal.        Behavior: Behavior normal.     Assessment/Plan: Please see individual problem list.  Essential  hypertension Assessment & Plan: Chronic hypertension with systolic pressures often exceeding 190 mmHg causes headaches and dizziness. Current medications are insufficient. Increase telmisartan  hydrochlorothiazide to two tablets once daily. Prescribe clonidine for systolic blood pressure greater than 190 mmHg, to be taken as needed. Schedule a follow-up blood pressure check in two weeks with the nurse. Order blood work. Check CMP. Strict precautions given to patient.   Orders: -     CBC with Differential/Platelet -     Comprehensive metabolic panel with GFR -     Telmisartan -HCTZ; Take 2 tablets by mouth daily.  Dispense: 180 tablet; Refill: 3 -     cloNIDine HCl; Take 1  tablet (0.1 mg total) by mouth 2 (two) times daily. As needed for systolic BP greater than 190.  Dispense: 60 tablet; Refill: 3  Seizure-like activity (HCC) Assessment & Plan: Possible seizure disorder with monthly episodes of electrical sensations and tremors. Neurology is involved, and a brain scan is planned. Continue follow-up with neurology and ensure the brain scan is completed as planned. Monitor for new neurological deficits and advise a hospital visit if he occurs.    Ulcerative colitis without complications, unspecified location [facility]) Assessment & Plan: Stable on current medication regimen. Managed by GI. Follow up as scheduled.   Hyperlipidemia LDL goal <70 Assessment & Plan: Managed with Lipitor daily. Continue.      Return in about 2 weeks (around 02/11/2024) for Blood pressure check with nursing and lab, then 6 month follow up.   Erik Glance, NP-C [facility]

## 2024-01-29 LAB — CBC WITH DIFFERENTIAL/PLATELET
Basophils Absolute: 0 x10E3/uL (ref 0.0–0.2)
Basos: 1 %
EOS (ABSOLUTE): 0.2 x10E3/uL (ref 0.0–0.4)
Eos: 3 %
Hematocrit: 35.5 % — ABNORMAL LOW (ref 37.5–51.0)
Hemoglobin: 11.8 g/dL — ABNORMAL LOW (ref 13.0–17.7)
Immature Grans (Abs): 0 x10E3/uL (ref 0.0–0.1)
Immature Granulocytes: 0 %
Lymphocytes Absolute: 2.4 x10E3/uL (ref 0.7–3.1)
Lymphs: 36 %
MCH: 34.7 pg — ABNORMAL HIGH (ref 26.6–33.0)
MCHC: 33.2 g/dL (ref 31.5–35.7)
MCV: 104 fL — ABNORMAL HIGH (ref 79–97)
Monocytes Absolute: 0.7 x10E3/uL (ref 0.1–0.9)
Monocytes: 11 %
Neutrophils Absolute: 3.2 x10E3/uL (ref 1.4–7.0)
Neutrophils: 49 %
Platelets: 179 x10E3/uL (ref 150–450)
RBC: 3.4 x10E6/uL — ABNORMAL LOW (ref 4.14–5.80)
RDW: 11.3 % — ABNORMAL LOW (ref 11.6–15.4)
WBC: 6.5 x10E3/uL (ref 3.4–10.8)

## 2024-01-29 LAB — COMPREHENSIVE METABOLIC PANEL WITH GFR
ALT: 24 IU/L (ref 0–44)
AST: 28 IU/L (ref 0–40)
Albumin: 4.7 g/dL (ref 3.8–4.8)
Alkaline Phosphatase: 83 IU/L (ref 47–123)
BUN/Creatinine Ratio: 22 (ref 10–24)
BUN: 21 mg/dL (ref 8–27)
Bilirubin Total: 0.4 mg/dL (ref 0.0–1.2)
CO2: 22 mmol/L (ref 20–29)
Calcium: 10.3 mg/dL — ABNORMAL HIGH (ref 8.6–10.2)
Chloride: 104 mmol/L (ref 96–106)
Creatinine, Ser: 0.96 mg/dL (ref 0.76–1.27)
Globulin, Total: 3.1 g/dL (ref 1.5–4.5)
Glucose: 113 mg/dL — ABNORMAL HIGH (ref 70–99)
Potassium: 4.7 mmol/L (ref 3.5–5.2)
Sodium: 141 mmol/L (ref 134–144)
Total Protein: 7.8 g/dL (ref 6.0–8.5)
eGFR: 82 mL/min/1.73 (ref >59)

## 2024-02-04 ENCOUNTER — Encounter: Payer: Self-pay | Admitting: Nurse Practitioner

## 2024-02-04 ENCOUNTER — Ambulatory Visit: Payer: Self-pay | Admitting: Nurse Practitioner

## 2024-02-04 DIAGNOSIS — K5190 Ulcerative colitis, unspecified, without complications: Secondary | ICD-10-CM

## 2024-02-04 DIAGNOSIS — R569 Unspecified convulsions: Secondary | ICD-10-CM | POA: Insufficient documentation

## 2024-02-04 NOTE — Assessment & Plan Note (Signed)
 Possible seizure disorder with monthly episodes of electrical sensations and tremors. Neurology is involved, and a brain scan is planned. Continue follow-up with neurology and ensure the brain scan is completed as planned. Monitor for new neurological deficits and advise a hospital visit if he occurs.

## 2024-02-04 NOTE — Assessment & Plan Note (Signed)
 Stable on current medication regimen. Managed by GI. Follow up as scheduled.

## 2024-02-04 NOTE — Assessment & Plan Note (Signed)
 Chronic hypertension with systolic pressures often exceeding 190 mmHg causes headaches and dizziness. Current medications are insufficient. Increase telmisartan  hydrochlorothiazide to two tablets once daily. Prescribe clonidine for systolic blood pressure greater than 190 mmHg, to be taken as needed. Schedule a follow-up blood pressure check in two weeks with the nurse. Order blood work. Check CMP. Strict precautions given to patient.

## 2024-02-04 NOTE — Assessment & Plan Note (Signed)
 Managed with Lipitor daily. Continue.

## 2024-02-08 ENCOUNTER — Other Ambulatory Visit (INDEPENDENT_AMBULATORY_CARE_PROVIDER_SITE_OTHER)

## 2024-02-08 ENCOUNTER — Ambulatory Visit: Payer: Self-pay | Admitting: Family

## 2024-02-08 DIAGNOSIS — K5190 Ulcerative colitis, unspecified, without complications: Secondary | ICD-10-CM

## 2024-02-08 LAB — CBC WITH DIFFERENTIAL/PLATELET
Basophils Absolute: 0 K/uL (ref 0.0–0.1)
Basophils Relative: 0.5 % (ref 0.0–3.0)
Eosinophils Absolute: 0.1 K/uL (ref 0.0–0.7)
Eosinophils Relative: 1.2 % (ref 0.0–5.0)
HCT: 36.4 % — ABNORMAL LOW (ref 39.0–52.0)
Hemoglobin: 11.9 g/dL — ABNORMAL LOW (ref 13.0–17.0)
Lymphocytes Relative: 26.8 % (ref 12.0–46.0)
Lymphs Abs: 2.4 K/uL (ref 0.7–4.0)
MCHC: 32.6 g/dL (ref 30.0–36.0)
MCV: 105.1 fl — ABNORMAL HIGH (ref 78.0–100.0)
Monocytes Absolute: 0.7 K/uL (ref 0.1–1.0)
Monocytes Relative: 7.9 % (ref 3.0–12.0)
Neutro Abs: 5.6 K/uL (ref 1.4–7.7)
Neutrophils Relative %: 63.6 % (ref 43.0–77.0)
Platelets: 226 K/uL (ref 150.0–400.0)
RBC: 3.46 Mil/uL — ABNORMAL LOW (ref 4.22–5.81)
RDW: 12.2 % (ref 11.5–15.5)
WBC: 8.8 K/uL (ref 4.0–10.5)

## 2024-02-24 ENCOUNTER — Other Ambulatory Visit: Payer: Self-pay | Admitting: Internal Medicine

## 2024-03-19 ENCOUNTER — Other Ambulatory Visit: Payer: Self-pay | Admitting: Physician Assistant

## 2024-03-26 ENCOUNTER — Other Ambulatory Visit: Payer: Self-pay | Admitting: Internal Medicine

## 2024-03-26 DIAGNOSIS — U071 COVID-19: Secondary | ICD-10-CM

## 2024-03-26 DIAGNOSIS — J9801 Acute bronchospasm: Secondary | ICD-10-CM

## 2024-04-25 ENCOUNTER — Encounter: Payer: Self-pay | Admitting: Cardiology

## 2024-04-25 ENCOUNTER — Ambulatory Visit: Admitting: Cardiology

## 2024-04-25 ENCOUNTER — Other Ambulatory Visit: Payer: Self-pay | Admitting: Nurse Practitioner

## 2024-04-25 VITALS — BP 130/74 | HR 58 | Ht 67.0 in | Wt 175.2 lb

## 2024-04-25 DIAGNOSIS — I10 Essential (primary) hypertension: Secondary | ICD-10-CM

## 2024-04-25 DIAGNOSIS — I739 Peripheral vascular disease, unspecified: Secondary | ICD-10-CM | POA: Diagnosis not present

## 2024-04-25 DIAGNOSIS — E785 Hyperlipidemia, unspecified: Secondary | ICD-10-CM | POA: Insufficient documentation

## 2024-04-25 DIAGNOSIS — I5032 Chronic diastolic (congestive) heart failure: Secondary | ICD-10-CM | POA: Insufficient documentation

## 2024-04-25 DIAGNOSIS — I2510 Atherosclerotic heart disease of native coronary artery without angina pectoris: Secondary | ICD-10-CM | POA: Insufficient documentation

## 2024-04-25 NOTE — Patient Instructions (Signed)
 Medication Instructions:  Your physician recommends that you continue on your current medications as directed. Please refer to the Current Medication list given to you today.   *If you need a refill on your cardiac medications before your next appointment, please call your pharmacy*  Lab Work: Your provider would like for you to have following labs drawn today Direct LDL.   If you have labs (blood work) drawn today and your tests are completely normal, you will receive your results only by: MyChart Message (if you have MyChart) OR A paper copy in the mail If you have any lab test that is abnormal or we need to change your treatment, we will call you to review the results.  Testing/Procedures: No test ordered today   Follow-Up: At [facility], you and your health needs are our priority.  As part of our continuing mission to provide you with exceptional heart care, our providers are all part of one team.  This team includes your primary Cardiologist (physician) and Advanced Practice Providers or APPs (Physician Assistants and Nurse Practitioners) who all work together to provide you with the care you need, when you need it.  Your next appointment:   6 month(s)  Provider:   You may see [name], MD or one of the following Advanced Practice Providers on your designated Care Team:   Tylene Lunch, NP

## 2024-04-25 NOTE — Progress Notes (Signed)
 " Cardiology Office Note   Date:  04/25/2024  ID:  Erik, Nicholson 1950/11/27, MRN [id number] PCP: [name], NP  [facility] Providers Cardiologist:  [name], MD Cardiology APP:  [name], NP     History of Present Illness Erik Nicholson is a 74 y.o. male with past medical history of coronary disease status post multiple PCI, HFpEF, PVD status post bilateral lower extremity stent followed by vascular surgery, ulcerative colitis, hepatitis C, Suspicion of COPD, hypertension, hyperlipidemia, prostate cancer, BPH, who presents today for follow-up.   CAD and PVD, notes indicate he has a history of 7 coronary and 3 lower extremity stents while living in the [place] area.  Most recent LHC in 03/2020 demonstrated severe single-vessel CAD with up to 75% ISR of the mid/distal RCA stent as well as 60 to 70% ostial stenosis of RPDA and RPL 1.  There was mild to moderate noncritical left coronary disease with most severe lesion located in a small to moderate caliber D1 branch that was jailed by prior LAD stent with up to 70% stenosis.  Normal LV systolic function with mildly elevated filling pressures.  He underwent PTCA of the mid/distal RCA with reduction in stenosis from 75% to 20% with TIMI-3 flow.  Stent placement was not attempted due to multiple layers of stent already present and incomplete expansion of a 3.5 mm noncompliant balloon at 22 ATM.  With percutaneous intervention he did note improvement in chest pain, though did continue to have significant exertional dyspnea.  PFTs showed probable small obstructive airway disease with bronchodilator response, for which he is followed by pulmonology.  Echo from 05/14/2020 showed an EF of 55 to 60%, no regional wall motion abnormalities, mild LVH, grade 1 diastolic dysfunction, normal RV systolic function and ventricular cavity size, trivial mitral regurgitation, and an estimated right atrial pressure of 3 mmHg.  PFTs in 05/2020  showed evidence of obstructive airway disease.  Was seen in the office 11/18/2020 continue to note chronic exertional dyspnea.  He was euvolemic on exam but given his symptoms underwent Lexiscan  MPI 11/25/2020 which showed no significant ischemia was low risk study with an EF of 55 to 65%.  He wore an event monitor 02/2021 which revealed predominant rhythm of sinus with an average heart rate of 77 bpm, a single 4 beat run of NSVT occurred with maximal rate of 193 bpm, 6 atrial runs lasting up to 7 beats at a maximal rate of 139 bpm with rare PACs and PVCs with no sustained arrhythmias or prolonged pauses.  He was seen in clinic 05/11/2023 stating been doing well with cardiac perspective.  He had no symptoms of angina or anginal equivalents.  Was continued on his current medication regimen with no further testing that was ordered.  Preoperative cardiovascular clearance was offered for his upcoming procedure.  He was advised hold Plavix  for 5 days and restart when safe after surgery.  He was last seen in clinic 10/26/2023 stating he was doing well from a cardiac perspective.  He had had 1 visit to the emergency department back in June after a fall.  Since that time he had had some back discomfort.  Send he had been compliant with his current medication regimen.  No medication changes that were made and no further testing that was ordered.    He returns to clinic today stating overall for the cardiac perspective he has been doing well.  He stated that back in October he had some  atypical chest discomfort that was with rest and resolved after a few moments on its own.  He did request a refill of his Nitrostat  from his PCP but has not required the use of it.  He said approximately 3 seizure like spells within the last year and is followed up with neurology.  EEG revealed no seizure activity but MRI done 01/2023 showed CVA.  States he continues to follow with vascular for his PAD.  Has been compliant with his current  medication regimen without any undue side effects.  Denies any recent hospitalizations or visits to the emergency department.  ROS: 10 point review of system has been reviewed and considered negative the exception was been listed in the HPI  Studies Reviewed EKG Interpretation Date/Time:  Tuesday April 25 2024 13:25:17 EST Ventricular Rate:  58 PR Interval:  202 QRS Duration:  90 QT Interval:  404 QTC Calculation: 396 R Axis:   -9  Text Interpretation: Sinus bradycardia Inferior infarct (cited on or before 31-Oct-2020) Left ventricular hypertrophy When compared with ECG of 26-Oct-2023 10:01, No significant change was found Confirmed by [name] ([id number]) on 04/25/2024 1:31:58 PM    2D echo 05/01/2022: 1. Left ventricular ejection fraction, by estimation, is 55 to 60%. The  left ventricle has normal function. The left ventricle has no regional  wall motion abnormalities. Left ventricular diastolic parameters are  consistent with Grade I diastolic  dysfunction (impaired relaxation). The average left ventricular global  longitudinal strain is -16.4 %.   2. Right ventricular systolic function is normal. The right ventricular  size is normal. There is normal pulmonary artery systolic pressure. The  estimated right ventricular systolic pressure is 20.3 mmHg.   3. The mitral valve is normal in structure. No evidence of mitral valve  regurgitation. No evidence of mitral stenosis.   4. The aortic valve is tricuspid. Aortic valve regurgitation is not  visualized. No aortic stenosis is present.   5. The inferior vena cava is normal in size with greater than 50%  respiratory variability, suggesting right atrial pressure of 3 mmHg.    Zio patch 02/2021: The patient was monitored for 12 days, 23 hours. Predominant rhythm was sinus with an average rate of 77 bpm (range 52-118 bpm in sinus). There were rare PACs and PVCs. A single 4 beat run of nonsustained ventricular tachycardia occurred,  with a maximum rate of 193 bpm. There were six atrial runs lasting up to 7 beats with a maximum rate of 139 bpm. No sustained arrhythmia or prolonged pause was observed. Patient triggered events correspond to sinus rhythm and PACs.   Predominantly sinus rhythm with rare PACs and PVCs.  A few brief episodes of PSVT and NSVT occurred.   Lexiscan  MPI 11/2020: ST segment elevation was noted during stress. No T wave inversion was noted during stress. The study is normal. This is a low risk study. The left ventricular ejection fraction is normal (55-65%). CT attenuation images showed significant coronary calcifications and mild aortic calcifications.   2D echo 05/14/2020: 1. Left ventricular ejection fraction, by estimation, is 55 to 60%. The  left ventricle has normal function. The left ventricle has no regional  wall motion abnormalities. There is mild left ventricular hypertrophy.  Left ventricular diastolic parameters  are consistent with Grade I diastolic dysfunction (impaired relaxation).  The average left ventricular global longitudinal strain is -16.3 %.   2. Right ventricular systolic function is normal. The right ventricular  size is normal.   3. The  mitral valve is grossly normal. Trivial mitral valve  regurgitation. No evidence of mitral stenosis.   4. The aortic valve was not well visualized. Aortic valve regurgitation  is not visualized. No aortic stenosis is present.   5. The inferior vena cava is normal in size with greater than 50%  respiratory variability, suggesting right atrial pressure of 3 mmHg.   LHC 03/21/2020: Conclusions: Severe single-vessel coronary artery disease with up to 75% in-stent restenosis of the mid/distal RCA (FFR 0.70), as well as 60-70% ostial stenoses of rPDA and rPL1. Mild to moderate, non-critical left coronary artery disease.  The most severe lesion is a small to moderate caliber D1 branch that is jailed by LAD stent with up to 70%  stenosis. Normal left ventricular systolic function with mildly elevated filling pressure. Successful PTCA of mid/distal RCA with reduction in stenosis from 75% to 20% with TIMI-3 flow.  Stent placement not attempted due to multiple layers of stent already present and incomplete expansion of 3.5 mm non-compliant balloon at 22 atm.   Recommendations: Continue aggressive secondary prevention. Indefinite dual antiplatelet therapy with aspirin  and clopidogrel ; discontinuation of aspirin  could be considered after 6 months of therapy. Anticipate same-day discharge if no post-catheterization complication occur.   Lexiscan  MPI 03/07/2019: Normal pharmacologic myocardial perfusion stress test without significant ischemia or scar. The left ventricular ejection fraction is normal by visual estimation and Siemens calculation (56%). Dense coronary artery calcifications and/or prior coronary stents are noted on the attenuation correction CT. This is a low risk study.   2D echo 03/02/2019: 1. Left ventricular ejection fraction, by visual estimation, is 60 to  65%. The left ventricle has normal function. There is no left ventricular  hypertrophy.   2. Left ventricular diastolic parameters are consistent with Grade I  diastolic dysfunction (impaired relaxation).   3. Global right ventricle has normal systolic function.The right  ventricular size is normal. No increase in right ventricular wall  thickness.   4. Left atrial size was normal.   5. Normal pulmonary artery systolic pressure.   LHC 02/25/2012 ([facility]): Patent stents in the proximal and mid RCA, 40% mid RCA stenosis, normal left heart pressures, normal LV systolic function  Risk Assessment/Calculations           Physical Exam VS:  BP 130/74 (BP Location: Left Arm, Patient Position: Sitting, Cuff Size: Normal)   Pulse (!) 58   Ht 5' 7 (1.702 m)   Wt 175 lb 3.2 oz (79.5 kg)   SpO2 96%   BMI 27.44 kg/m         Wt Readings from Last 3 Encounters:  04/25/24 175 lb 3.2 oz (79.5 kg)  01/28/24 176 lb (79.8 kg)  01/17/24 176 lb (79.8 kg)    GEN: Well nourished, well developed in no acute distress NECK: No JVD; No carotid bruits CARDIAC: RRR, no murmurs, rubs, gallops RESPIRATORY:  Clear to auscultation without rales, wheezing or rhonchi  ABDOMEN: Soft, non-tender, non-distended EXTREMITIES:  No edema; No deformity   ASSESSMENT AND PLAN Coronary artery disease native coronary without angina.  He stated that his last episode of any type of angina that he had was back in October but has been without any symptoms or associated symptoms as of late.  He has longstanding history of exertional dyspnea that is unchanged and he continues to use his inhaler for.  EKG today reveals sinus bradycardia with rate 58 with an old inferior infarct, LVH, no acute ischemic changes.  He is continued  on clopidogrel  75 mg daily and Lipitor 40 mg daily.  No further ischemic evaluation is needed at this time.  He has been advised for any reoccurrence of chest discomfort or use of his Nitrostat  he will need to have some type of ischemic eval.  Since he has been several months without symptoms further testing is deferred at this time.  HFpEF with chronic dyspnea on exertion with an LVEF of 55 to 60%, no RWMA, G1 DD, and no valvular abnormalities.  He continues to remain euvolemic on exam.  Suffers from NYHA class II symptoms.  Does not require the use of standing diuretic therapy.  The addition of SGLT2 inhibitor and MRA therapy has previously been deferred due to episodes of hypotension on chronic pain medications.  He is without symptoms of heart failure or decompensation.  If he develops symptoms will escalate GDMT.  Primary hypertension which he states his blood pressure has been labile.  Blood pressure today is 130/74.  He is continued on Toprol -XL 100 mg daily, telmisartan  HCTZ 80/12.5 mg twice daily and clonidine  0.1 mg 2 times  daily as needed for systolic blood pressure greater than 190 mmHg.  He has been encouraged to monitor his pressures 1 to 2 hours postmedication administration as well.  Mixed hyperlipidemia with his last LDL checked in 2024.  He is continued on atorvastatin  40 mg daily and sent for an updated direct LDL today.  Peripheral arterial disease with no current symptoms.  Continues to be followed by vein and vascular.  He continues to remain on clopidogrel  75 mg daily and atorvastatin  40 mg daily.  Chronic left cerebellar ischemic infarctions and chronic left temporal microhemorrhage noted on MRI of 01/2023.  He continues to follow-up with neurology.       Dispo: Patient to return to clinic to see MD/APP in 6 months or sooner if needed for further evaluation.  Signed, Crystalann Korf, NP   "

## 2024-04-26 ENCOUNTER — Ambulatory Visit: Payer: Self-pay | Admitting: Cardiology

## 2024-04-26 DIAGNOSIS — E785 Hyperlipidemia, unspecified: Secondary | ICD-10-CM

## 2024-04-26 LAB — LDL CHOLESTEROL, DIRECT: LDL Direct: 89 mg/dL (ref 0–99)

## 2024-04-26 NOTE — Progress Notes (Signed)
 Direct LDL of 89 with a goal of 70 or less.  Recommend continue atorvastatin  40 mg daily and add ezetimibe 10 mg daily.  Repeat lipid and hepatic panel in 10 to 12 weeks.

## 2024-04-27 MED ORDER — EZETIMIBE 10 MG PO TABS: 10.0000 mg | ORAL_TABLET | Freq: Every day | ORAL | 3 refills | Status: AC

## 2024-05-21 ENCOUNTER — Other Ambulatory Visit: Payer: Self-pay | Admitting: Internal Medicine

## 2024-05-23 ENCOUNTER — Other Ambulatory Visit: Payer: Self-pay | Admitting: Gastroenterology

## 2024-10-16 ENCOUNTER — Ambulatory Visit
